# Patient Record
Sex: Male | Born: 1945 | Race: White | Hispanic: No | Smoking: Former smoker
Health system: Southern US, Community
[De-identification: ages and names within clinical notes are randomized; demographics above are authoritative.]

## PROBLEM LIST (undated history)

## (undated) DIAGNOSIS — D649 Anemia, unspecified: Secondary | ICD-10-CM

## (undated) DIAGNOSIS — N189 Chronic kidney disease, unspecified: Secondary | ICD-10-CM

## (undated) DIAGNOSIS — K219 Gastro-esophageal reflux disease without esophagitis: Secondary | ICD-10-CM

## (undated) DIAGNOSIS — C801 Malignant (primary) neoplasm, unspecified: Secondary | ICD-10-CM

## (undated) DIAGNOSIS — A6920 Lyme disease, unspecified: Secondary | ICD-10-CM

## (undated) DIAGNOSIS — H269 Unspecified cataract: Secondary | ICD-10-CM

## (undated) DIAGNOSIS — M47812 Spondylosis without myelopathy or radiculopathy, cervical region: Secondary | ICD-10-CM

## (undated) DIAGNOSIS — R413 Other amnesia: Secondary | ICD-10-CM

## (undated) DIAGNOSIS — K111 Hypertrophy of salivary gland: Secondary | ICD-10-CM

## (undated) DIAGNOSIS — C189 Malignant neoplasm of colon, unspecified: Secondary | ICD-10-CM

## (undated) DIAGNOSIS — Z87442 Personal history of urinary calculi: Secondary | ICD-10-CM

## (undated) DIAGNOSIS — I639 Cerebral infarction, unspecified: Secondary | ICD-10-CM

## (undated) DIAGNOSIS — K5900 Constipation, unspecified: Secondary | ICD-10-CM

## (undated) DIAGNOSIS — G039 Meningitis, unspecified: Secondary | ICD-10-CM

## (undated) DIAGNOSIS — G4730 Sleep apnea, unspecified: Secondary | ICD-10-CM

## (undated) DIAGNOSIS — J189 Pneumonia, unspecified organism: Secondary | ICD-10-CM

## (undated) DIAGNOSIS — K649 Unspecified hemorrhoids: Secondary | ICD-10-CM

## (undated) DIAGNOSIS — C3490 Malignant neoplasm of unspecified part of unspecified bronchus or lung: Secondary | ICD-10-CM

## (undated) DIAGNOSIS — J338 Other polyp of sinus: Secondary | ICD-10-CM

## (undated) DIAGNOSIS — Z15068 Genetic susceptibility to colorectal cancer: Secondary | ICD-10-CM

## (undated) DIAGNOSIS — M1990 Unspecified osteoarthritis, unspecified site: Secondary | ICD-10-CM

## (undated) DIAGNOSIS — K642 Third degree hemorrhoids: Secondary | ICD-10-CM

## (undated) DIAGNOSIS — Z5189 Encounter for other specified aftercare: Secondary | ICD-10-CM

## (undated) DIAGNOSIS — N200 Calculus of kidney: Secondary | ICD-10-CM

## (undated) DIAGNOSIS — C179 Malignant neoplasm of small intestine, unspecified: Secondary | ICD-10-CM

## (undated) DIAGNOSIS — K648 Other hemorrhoids: Secondary | ICD-10-CM

## (undated) DIAGNOSIS — I499 Cardiac arrhythmia, unspecified: Secondary | ICD-10-CM

## (undated) DIAGNOSIS — Z1509 Genetic susceptibility to other malignant neoplasm: Secondary | ICD-10-CM

## (undated) DIAGNOSIS — Z1507 Genetic susceptibility to malignant neoplasm of urinary tract: Secondary | ICD-10-CM

## (undated) DIAGNOSIS — N400 Enlarged prostate without lower urinary tract symptoms: Secondary | ICD-10-CM

## (undated) DIAGNOSIS — G834 Cauda equina syndrome: Secondary | ICD-10-CM

## (undated) DIAGNOSIS — Z86010 Personal history of colonic polyps: Secondary | ICD-10-CM

## (undated) HISTORY — PX: OTHER SURGICAL HISTORY: SHX169

## (undated) HISTORY — DX: Anemia, unspecified: D64.9

## (undated) HISTORY — DX: Cardiac arrhythmia, unspecified: I49.9

## (undated) HISTORY — PX: COLONOSCOPY: SHX174

## (undated) HISTORY — DX: Genetic susceptibility to other malignant neoplasm: Z15.09

## (undated) HISTORY — DX: Unspecified cataract: H26.9

## (undated) HISTORY — DX: Chronic kidney disease, unspecified: N18.9

## (undated) HISTORY — DX: Gastro-esophageal reflux disease without esophagitis: K21.9

## (undated) HISTORY — DX: Malignant (primary) neoplasm, unspecified: C80.1

## (undated) HISTORY — PX: UPPER GI ENDOSCOPY: SHX6162

## (undated) HISTORY — DX: Constipation, unspecified: K59.00

## (undated) HISTORY — DX: Other polyp of sinus: J33.8

## (undated) HISTORY — DX: Encounter for other specified aftercare: Z51.89

## (undated) HISTORY — DX: Genetic susceptibility to malignant neoplasm of urinary tract: Z15.07

## (undated) HISTORY — DX: Meningitis, unspecified: G03.9

## (undated) HISTORY — DX: Sleep apnea, unspecified: G47.30

## (undated) HISTORY — DX: Benign prostatic hyperplasia without lower urinary tract symptoms: N40.0

## (undated) HISTORY — DX: Unspecified hemorrhoids: K64.9

## (undated) HISTORY — DX: Cauda equina syndrome: G83.4

## (undated) HISTORY — DX: Malignant neoplasm of unspecified part of unspecified bronchus or lung: C34.90

## (undated) HISTORY — DX: Third degree hemorrhoids: K64.2

## (undated) HISTORY — DX: Cerebral infarction, unspecified: I63.9

## (undated) HISTORY — DX: Unspecified osteoarthritis, unspecified site: M19.90

## (undated) HISTORY — DX: Other hemorrhoids: K64.8

## (undated) HISTORY — PX: HEMORRHOID BANDING: SHX5850

## (undated) HISTORY — DX: Genetic susceptibility to other malignant neoplasm of digestive system: Z15.09

## (undated) HISTORY — PX: SPINE SURGERY: SHX786

## (undated) HISTORY — DX: Malignant neoplasm of colon, unspecified: C18.9

## (undated) HISTORY — DX: Lyme disease, unspecified: A69.20

## (undated) HISTORY — DX: Hypertrophy of salivary gland: K11.1

## (undated) HISTORY — DX: Personal history of colonic polyps: Z86.010

## (undated) HISTORY — DX: Malignant neoplasm of small intestine, unspecified: C17.9

## (undated) HISTORY — DX: Spondylosis without myelopathy or radiculopathy, cervical region: M47.812

## (undated) HISTORY — DX: Calculus of kidney: N20.0

## (undated) HISTORY — DX: Genetic susceptibility to colorectal cancer: Z15.068

---

## 2005-08-22 ENCOUNTER — Ambulatory Visit (HOSPITAL_COMMUNITY): Admission: RE | Admit: 2005-08-22 | Discharge: 2005-08-22 | Payer: Self-pay | Admitting: Otolaryngology

## 2006-09-10 ENCOUNTER — Ambulatory Visit: Payer: Self-pay | Admitting: Internal Medicine

## 2006-09-10 DIAGNOSIS — K642 Third degree hemorrhoids: Secondary | ICD-10-CM | POA: Insufficient documentation

## 2006-09-10 DIAGNOSIS — R5383 Other fatigue: Secondary | ICD-10-CM

## 2006-09-10 DIAGNOSIS — K648 Other hemorrhoids: Secondary | ICD-10-CM

## 2006-09-10 DIAGNOSIS — M722 Plantar fascial fibromatosis: Secondary | ICD-10-CM | POA: Insufficient documentation

## 2006-09-10 DIAGNOSIS — R5381 Other malaise: Secondary | ICD-10-CM | POA: Insufficient documentation

## 2006-09-10 DIAGNOSIS — G4730 Sleep apnea, unspecified: Secondary | ICD-10-CM | POA: Insufficient documentation

## 2006-09-10 DIAGNOSIS — E291 Testicular hypofunction: Secondary | ICD-10-CM | POA: Insufficient documentation

## 2006-09-10 DIAGNOSIS — F528 Other sexual dysfunction not due to a substance or known physiological condition: Secondary | ICD-10-CM | POA: Insufficient documentation

## 2006-09-10 HISTORY — DX: Third degree hemorrhoids: K64.2

## 2006-09-10 HISTORY — DX: Other hemorrhoids: K64.8

## 2006-09-11 ENCOUNTER — Encounter (INDEPENDENT_AMBULATORY_CARE_PROVIDER_SITE_OTHER): Payer: Self-pay | Admitting: Family Medicine

## 2006-09-12 ENCOUNTER — Encounter (INDEPENDENT_AMBULATORY_CARE_PROVIDER_SITE_OTHER): Payer: Self-pay | Admitting: Family Medicine

## 2006-09-15 LAB — CONVERTED CEMR LAB
ALT: 17 units/L (ref 0–53)
AST: 17 units/L (ref 0–37)
Albumin: 4.5 g/dL (ref 3.5–5.2)
Alkaline Phosphatase: 49 units/L (ref 39–117)
BUN: 14 mg/dL (ref 6–23)
Basophils Absolute: 0 10*3/uL (ref 0.0–0.1)
Basophils Relative: 1 % (ref 0–1)
CO2: 25 meq/L (ref 19–32)
Calcium: 9.4 mg/dL (ref 8.4–10.5)
Chloride: 104 meq/L (ref 96–112)
Cholesterol: 159 mg/dL (ref 0–200)
Creatinine, Ser: 0.78 mg/dL (ref 0.40–1.50)
Eosinophils Absolute: 0.1 10*3/uL (ref 0.0–0.7)
Eosinophils Relative: 2 % (ref 0–5)
Glucose, Bld: 86 mg/dL (ref 70–99)
HCT: 42 % (ref 39.0–52.0)
HDL: 54 mg/dL (ref >39)
Hemoglobin: 14 g/dL (ref 13.0–17.0)
LDL Cholesterol: 89 mg/dL (ref 0–99)
Lymphocytes Relative: 39 % (ref 12–46)
Lymphs Abs: 2.5 10*3/uL (ref 0.7–3.3)
MCHC: 33.3 g/dL (ref 30.0–36.0)
MCV: 77.6 fL — ABNORMAL LOW (ref 78.0–100.0)
Monocytes Absolute: 0.6 10*3/uL (ref 0.2–0.7)
Monocytes Relative: 9 % (ref 3–11)
Neutro Abs: 3.2 10*3/uL (ref 1.7–7.7)
Neutrophils Relative %: 50 % (ref 43–77)
Platelets: 246 10*3/uL (ref 150–400)
Potassium: 4.3 meq/L (ref 3.5–5.3)
RBC: 5.41 M/uL (ref 4.22–5.81)
RDW: 14.3 % — ABNORMAL HIGH (ref 11.5–14.0)
Sex Hormone Binding: 33 nmol/L (ref 13–71)
Sodium: 140 meq/L (ref 135–145)
TSH: 1.521 microintl units/mL (ref 0.350–5.50)
Testosterone Free: 97 pg/mL (ref 47.0–244.0)
Testosterone-% Free: 2.1 % (ref 1.6–2.9)
Testosterone: 467.03 ng/dL (ref 350–890)
Total Bilirubin: 1 mg/dL (ref 0.3–1.2)
Total CHOL/HDL Ratio: 2.9
Total Protein: 6.9 g/dL (ref 6.0–8.3)
Triglycerides: 78 mg/dL (ref <150)
VLDL: 16 mg/dL (ref 0–40)
WBC: 6.4 10*3/uL (ref 4.0–10.5)

## 2006-09-17 ENCOUNTER — Encounter (INDEPENDENT_AMBULATORY_CARE_PROVIDER_SITE_OTHER): Payer: Self-pay | Admitting: Family Medicine

## 2006-10-08 ENCOUNTER — Ambulatory Visit: Payer: Self-pay | Admitting: Family Medicine

## 2006-10-08 DIAGNOSIS — I10 Essential (primary) hypertension: Secondary | ICD-10-CM | POA: Insufficient documentation

## 2006-11-19 ENCOUNTER — Ambulatory Visit: Payer: Self-pay | Admitting: Family Medicine

## 2007-01-28 ENCOUNTER — Ambulatory Visit: Payer: Self-pay | Admitting: Family Medicine

## 2007-10-02 ENCOUNTER — Ambulatory Visit: Payer: Self-pay | Admitting: Family Medicine

## 2007-10-02 ENCOUNTER — Telehealth (INDEPENDENT_AMBULATORY_CARE_PROVIDER_SITE_OTHER): Payer: Self-pay | Admitting: *Deleted

## 2007-10-02 DIAGNOSIS — K219 Gastro-esophageal reflux disease without esophagitis: Secondary | ICD-10-CM | POA: Insufficient documentation

## 2007-10-03 ENCOUNTER — Encounter (INDEPENDENT_AMBULATORY_CARE_PROVIDER_SITE_OTHER): Payer: Self-pay | Admitting: Family Medicine

## 2007-10-17 ENCOUNTER — Encounter (INDEPENDENT_AMBULATORY_CARE_PROVIDER_SITE_OTHER): Payer: Self-pay | Admitting: Family Medicine

## 2007-10-17 ENCOUNTER — Telehealth (INDEPENDENT_AMBULATORY_CARE_PROVIDER_SITE_OTHER): Payer: Self-pay | Admitting: *Deleted

## 2008-04-28 ENCOUNTER — Ambulatory Visit: Payer: Self-pay | Admitting: Internal Medicine

## 2009-02-07 ENCOUNTER — Ambulatory Visit: Payer: Self-pay | Admitting: Family Medicine

## 2009-02-07 DIAGNOSIS — M1990 Unspecified osteoarthritis, unspecified site: Secondary | ICD-10-CM | POA: Insufficient documentation

## 2009-02-07 DIAGNOSIS — R1032 Left lower quadrant pain: Secondary | ICD-10-CM | POA: Insufficient documentation

## 2009-02-07 DIAGNOSIS — R51 Headache: Secondary | ICD-10-CM | POA: Insufficient documentation

## 2009-02-10 ENCOUNTER — Encounter (INDEPENDENT_AMBULATORY_CARE_PROVIDER_SITE_OTHER): Payer: Self-pay | Admitting: Family Medicine

## 2009-02-11 LAB — CONVERTED CEMR LAB
ALT: 15 units/L (ref 0–53)
AST: 19 units/L (ref 0–37)
Albumin: 4.2 g/dL (ref 3.5–5.2)
CO2: 24 meq/L (ref 19–32)
Calcium: 9.1 mg/dL (ref 8.4–10.5)
Chloride: 105 meq/L (ref 96–112)
Cholesterol: 154 mg/dL (ref 0–200)
MCHC: 32.9 g/dL (ref 30.0–36.0)
MCV: 77.6 fL — ABNORMAL LOW (ref 78.0–100.0)
Neutrophils Relative %: 51 % (ref 43–77)
Platelets: 220 10*3/uL (ref 150–400)
Potassium: 4.6 meq/L (ref 3.5–5.3)
RDW: 14.8 % (ref 11.5–15.5)
Total CHOL/HDL Ratio: 2.9
Triglycerides: 77 mg/dL (ref <150)
VLDL: 15 mg/dL (ref 0–40)

## 2009-02-21 ENCOUNTER — Ambulatory Visit: Payer: Self-pay | Admitting: Family Medicine

## 2009-02-25 ENCOUNTER — Encounter (INDEPENDENT_AMBULATORY_CARE_PROVIDER_SITE_OTHER): Payer: Self-pay | Admitting: *Deleted

## 2009-03-08 ENCOUNTER — Telehealth (INDEPENDENT_AMBULATORY_CARE_PROVIDER_SITE_OTHER): Payer: Self-pay | Admitting: *Deleted

## 2009-03-10 ENCOUNTER — Ambulatory Visit: Payer: Self-pay | Admitting: Family Medicine

## 2009-03-10 DIAGNOSIS — S0083XA Contusion of other part of head, initial encounter: Secondary | ICD-10-CM

## 2009-03-10 DIAGNOSIS — S1093XA Contusion of unspecified part of neck, initial encounter: Secondary | ICD-10-CM

## 2009-03-10 DIAGNOSIS — S0003XA Contusion of scalp, initial encounter: Secondary | ICD-10-CM | POA: Insufficient documentation

## 2009-07-16 DIAGNOSIS — G834 Cauda equina syndrome: Secondary | ICD-10-CM

## 2009-07-16 HISTORY — DX: Cauda equina syndrome: G83.4

## 2010-03-07 ENCOUNTER — Encounter: Payer: Self-pay | Admitting: Emergency Medicine

## 2010-03-08 ENCOUNTER — Inpatient Hospital Stay (HOSPITAL_COMMUNITY): Admission: AD | Admit: 2010-03-08 | Discharge: 2010-03-16 | Payer: Self-pay | Admitting: Neurosurgery

## 2010-03-08 HISTORY — PX: LUMBAR LAMINECTOMY: SHX95

## 2010-03-10 ENCOUNTER — Ambulatory Visit: Payer: Self-pay | Admitting: Physical Medicine & Rehabilitation

## 2010-03-18 ENCOUNTER — Emergency Department (HOSPITAL_COMMUNITY): Admission: EM | Admit: 2010-03-18 | Discharge: 2010-03-18 | Payer: Self-pay | Admitting: Emergency Medicine

## 2010-09-29 LAB — URINALYSIS, ROUTINE W REFLEX MICROSCOPIC
Hgb urine dipstick: NEGATIVE
Nitrite: NEGATIVE
Specific Gravity, Urine: 1.021 (ref 1.005–1.030)
Urobilinogen, UA: 0.2 mg/dL (ref 0.0–1.0)
pH: 6 (ref 5.0–8.0)

## 2010-09-29 LAB — BASIC METABOLIC PANEL
BUN: 16 mg/dL (ref 6–23)
CO2: 28 mEq/L (ref 19–32)
Creatinine, Ser: 0.84 mg/dL (ref 0.4–1.5)
GFR calc non Af Amer: >60 mL/min (ref >60)

## 2010-09-29 LAB — DIFFERENTIAL
Basophils Relative: 1 % (ref 0–1)
Eosinophils Absolute: 0 10*3/uL (ref 0.0–0.7)
Lymphocytes Relative: 28 % (ref 12–46)
Lymphs Abs: 2.7 10*3/uL (ref 0.7–4.0)
Monocytes Relative: 8 % (ref 3–12)

## 2010-09-29 LAB — CBC
MCV: 81 fL (ref 78.0–100.0)
Platelets: 245 10*3/uL (ref 150–400)
RBC: 5.09 MIL/uL (ref 4.22–5.81)
RDW: 14.4 % (ref 11.5–15.5)
WBC: 9.5 10*3/uL (ref 4.0–10.5)

## 2011-05-29 ENCOUNTER — Other Ambulatory Visit: Payer: Self-pay | Admitting: Podiatry

## 2011-05-29 DIAGNOSIS — M722 Plantar fascial fibromatosis: Secondary | ICD-10-CM

## 2011-05-31 ENCOUNTER — Ambulatory Visit (HOSPITAL_COMMUNITY)
Admission: RE | Admit: 2011-05-31 | Discharge: 2011-05-31 | Disposition: A | Discharge status: Home / Self Care | Payer: BC Managed Care – PPO | Source: Ambulatory Visit | Attending: Podiatry | Admitting: Podiatry

## 2011-05-31 ENCOUNTER — Ambulatory Visit (HOSPITAL_COMMUNITY): Admission: RE | Admit: 2011-05-31 | Payer: BC Managed Care – PPO | Source: Ambulatory Visit

## 2011-05-31 DIAGNOSIS — M659 Synovitis and tenosynovitis, unspecified: Secondary | ICD-10-CM | POA: Insufficient documentation

## 2011-05-31 DIAGNOSIS — M722 Plantar fascial fibromatosis: Secondary | ICD-10-CM

## 2011-05-31 DIAGNOSIS — M2140 Flat foot [pes planus] (acquired), unspecified foot: Secondary | ICD-10-CM | POA: Insufficient documentation

## 2011-05-31 DIAGNOSIS — M65979 Unspecified synovitis and tenosynovitis, unspecified ankle and foot: Secondary | ICD-10-CM | POA: Insufficient documentation

## 2012-03-31 ENCOUNTER — Encounter: Payer: Self-pay | Admitting: Internal Medicine

## 2012-11-13 ENCOUNTER — Encounter: Payer: Self-pay | Admitting: Internal Medicine

## 2013-02-25 DIAGNOSIS — M961 Postlaminectomy syndrome, not elsewhere classified: Secondary | ICD-10-CM | POA: Diagnosis not present

## 2013-02-25 DIAGNOSIS — M546 Pain in thoracic spine: Secondary | ICD-10-CM | POA: Diagnosis not present

## 2013-02-25 DIAGNOSIS — M999 Biomechanical lesion, unspecified: Secondary | ICD-10-CM | POA: Diagnosis not present

## 2013-03-26 DIAGNOSIS — M999 Biomechanical lesion, unspecified: Secondary | ICD-10-CM | POA: Diagnosis not present

## 2013-03-26 DIAGNOSIS — M546 Pain in thoracic spine: Secondary | ICD-10-CM | POA: Diagnosis not present

## 2013-03-26 DIAGNOSIS — M961 Postlaminectomy syndrome, not elsewhere classified: Secondary | ICD-10-CM | POA: Diagnosis not present

## 2013-05-28 DIAGNOSIS — M999 Biomechanical lesion, unspecified: Secondary | ICD-10-CM | POA: Diagnosis not present

## 2013-05-28 DIAGNOSIS — M961 Postlaminectomy syndrome, not elsewhere classified: Secondary | ICD-10-CM | POA: Diagnosis not present

## 2013-05-28 DIAGNOSIS — M546 Pain in thoracic spine: Secondary | ICD-10-CM | POA: Diagnosis not present

## 2013-06-24 DIAGNOSIS — M999 Biomechanical lesion, unspecified: Secondary | ICD-10-CM | POA: Diagnosis not present

## 2013-06-24 DIAGNOSIS — M961 Postlaminectomy syndrome, not elsewhere classified: Secondary | ICD-10-CM | POA: Diagnosis not present

## 2013-06-24 DIAGNOSIS — M546 Pain in thoracic spine: Secondary | ICD-10-CM | POA: Diagnosis not present

## 2013-07-31 DIAGNOSIS — M961 Postlaminectomy syndrome, not elsewhere classified: Secondary | ICD-10-CM | POA: Diagnosis not present

## 2013-07-31 DIAGNOSIS — M999 Biomechanical lesion, unspecified: Secondary | ICD-10-CM | POA: Diagnosis not present

## 2013-07-31 DIAGNOSIS — M546 Pain in thoracic spine: Secondary | ICD-10-CM | POA: Diagnosis not present

## 2013-09-08 DIAGNOSIS — M961 Postlaminectomy syndrome, not elsewhere classified: Secondary | ICD-10-CM | POA: Diagnosis not present

## 2013-09-08 DIAGNOSIS — M999 Biomechanical lesion, unspecified: Secondary | ICD-10-CM | POA: Diagnosis not present

## 2013-09-08 DIAGNOSIS — M546 Pain in thoracic spine: Secondary | ICD-10-CM | POA: Diagnosis not present

## 2013-10-07 DIAGNOSIS — M546 Pain in thoracic spine: Secondary | ICD-10-CM | POA: Diagnosis not present

## 2013-10-07 DIAGNOSIS — M961 Postlaminectomy syndrome, not elsewhere classified: Secondary | ICD-10-CM | POA: Diagnosis not present

## 2013-10-07 DIAGNOSIS — M999 Biomechanical lesion, unspecified: Secondary | ICD-10-CM | POA: Diagnosis not present

## 2013-10-09 DIAGNOSIS — I8390 Asymptomatic varicose veins of unspecified lower extremity: Secondary | ICD-10-CM | POA: Diagnosis not present

## 2013-10-09 DIAGNOSIS — Z23 Encounter for immunization: Secondary | ICD-10-CM | POA: Diagnosis not present

## 2013-10-09 DIAGNOSIS — Z0000 Encounter for general adult medical examination without abnormal findings: Secondary | ICD-10-CM | POA: Diagnosis not present

## 2013-10-09 DIAGNOSIS — Z1211 Encounter for screening for malignant neoplasm of colon: Secondary | ICD-10-CM | POA: Diagnosis not present

## 2013-10-13 DIAGNOSIS — L719 Rosacea, unspecified: Secondary | ICD-10-CM | POA: Diagnosis not present

## 2013-10-13 DIAGNOSIS — L259 Unspecified contact dermatitis, unspecified cause: Secondary | ICD-10-CM | POA: Diagnosis not present

## 2013-10-22 DIAGNOSIS — M79609 Pain in unspecified limb: Secondary | ICD-10-CM | POA: Diagnosis not present

## 2013-10-22 DIAGNOSIS — I8310 Varicose veins of unspecified lower extremity with inflammation: Secondary | ICD-10-CM | POA: Diagnosis not present

## 2013-10-27 DIAGNOSIS — M79609 Pain in unspecified limb: Secondary | ICD-10-CM | POA: Diagnosis not present

## 2013-10-27 DIAGNOSIS — I8310 Varicose veins of unspecified lower extremity with inflammation: Secondary | ICD-10-CM | POA: Diagnosis not present

## 2013-11-03 DIAGNOSIS — I8310 Varicose veins of unspecified lower extremity with inflammation: Secondary | ICD-10-CM | POA: Diagnosis not present

## 2013-11-04 DIAGNOSIS — M961 Postlaminectomy syndrome, not elsewhere classified: Secondary | ICD-10-CM | POA: Diagnosis not present

## 2013-11-04 DIAGNOSIS — M999 Biomechanical lesion, unspecified: Secondary | ICD-10-CM | POA: Diagnosis not present

## 2013-11-04 DIAGNOSIS — M546 Pain in thoracic spine: Secondary | ICD-10-CM | POA: Diagnosis not present

## 2013-12-02 DIAGNOSIS — M546 Pain in thoracic spine: Secondary | ICD-10-CM | POA: Diagnosis not present

## 2013-12-02 DIAGNOSIS — M961 Postlaminectomy syndrome, not elsewhere classified: Secondary | ICD-10-CM | POA: Diagnosis not present

## 2013-12-02 DIAGNOSIS — M999 Biomechanical lesion, unspecified: Secondary | ICD-10-CM | POA: Diagnosis not present

## 2013-12-29 DIAGNOSIS — M546 Pain in thoracic spine: Secondary | ICD-10-CM | POA: Diagnosis not present

## 2013-12-29 DIAGNOSIS — M961 Postlaminectomy syndrome, not elsewhere classified: Secondary | ICD-10-CM | POA: Diagnosis not present

## 2013-12-29 DIAGNOSIS — M999 Biomechanical lesion, unspecified: Secondary | ICD-10-CM | POA: Diagnosis not present

## 2013-12-31 DIAGNOSIS — M62838 Other muscle spasm: Secondary | ICD-10-CM | POA: Diagnosis not present

## 2013-12-31 DIAGNOSIS — IMO0002 Reserved for concepts with insufficient information to code with codable children: Secondary | ICD-10-CM | POA: Diagnosis not present

## 2014-01-01 DIAGNOSIS — M999 Biomechanical lesion, unspecified: Secondary | ICD-10-CM | POA: Diagnosis not present

## 2014-01-01 DIAGNOSIS — M9981 Other biomechanical lesions of cervical region: Secondary | ICD-10-CM | POA: Diagnosis not present

## 2014-01-01 DIAGNOSIS — M531 Cervicobrachial syndrome: Secondary | ICD-10-CM | POA: Diagnosis not present

## 2014-01-01 DIAGNOSIS — M546 Pain in thoracic spine: Secondary | ICD-10-CM | POA: Diagnosis not present

## 2014-01-04 DIAGNOSIS — M9981 Other biomechanical lesions of cervical region: Secondary | ICD-10-CM | POA: Diagnosis not present

## 2014-01-04 DIAGNOSIS — M999 Biomechanical lesion, unspecified: Secondary | ICD-10-CM | POA: Diagnosis not present

## 2014-01-04 DIAGNOSIS — M531 Cervicobrachial syndrome: Secondary | ICD-10-CM | POA: Diagnosis not present

## 2014-01-04 DIAGNOSIS — M546 Pain in thoracic spine: Secondary | ICD-10-CM | POA: Diagnosis not present

## 2014-01-07 DIAGNOSIS — M531 Cervicobrachial syndrome: Secondary | ICD-10-CM | POA: Diagnosis not present

## 2014-01-07 DIAGNOSIS — M9981 Other biomechanical lesions of cervical region: Secondary | ICD-10-CM | POA: Diagnosis not present

## 2014-01-07 DIAGNOSIS — M999 Biomechanical lesion, unspecified: Secondary | ICD-10-CM | POA: Diagnosis not present

## 2014-01-07 DIAGNOSIS — M546 Pain in thoracic spine: Secondary | ICD-10-CM | POA: Diagnosis not present

## 2014-01-11 ENCOUNTER — Other Ambulatory Visit (HOSPITAL_COMMUNITY): Payer: Self-pay | Admitting: Family Medicine

## 2014-01-11 DIAGNOSIS — M5412 Radiculopathy, cervical region: Secondary | ICD-10-CM | POA: Diagnosis not present

## 2014-01-14 ENCOUNTER — Ambulatory Visit (HOSPITAL_COMMUNITY)
Admission: RE | Admit: 2014-01-14 | Discharge: 2014-01-14 | Disposition: A | Discharge status: Home / Self Care | Payer: Medicare Other | Source: Ambulatory Visit | Attending: Family Medicine | Admitting: Family Medicine

## 2014-01-14 DIAGNOSIS — M25519 Pain in unspecified shoulder: Secondary | ICD-10-CM | POA: Diagnosis not present

## 2014-01-14 DIAGNOSIS — X500XXA Overexertion from strenuous movement or load, initial encounter: Secondary | ICD-10-CM | POA: Insufficient documentation

## 2014-01-14 DIAGNOSIS — S0993XA Unspecified injury of face, initial encounter: Secondary | ICD-10-CM | POA: Diagnosis not present

## 2014-01-14 DIAGNOSIS — M47812 Spondylosis without myelopathy or radiculopathy, cervical region: Secondary | ICD-10-CM | POA: Diagnosis not present

## 2014-01-14 DIAGNOSIS — R209 Unspecified disturbances of skin sensation: Secondary | ICD-10-CM | POA: Diagnosis not present

## 2014-01-14 DIAGNOSIS — M5412 Radiculopathy, cervical region: Secondary | ICD-10-CM

## 2014-01-14 DIAGNOSIS — S199XXA Unspecified injury of neck, initial encounter: Secondary | ICD-10-CM | POA: Diagnosis not present

## 2014-01-14 DIAGNOSIS — M4802 Spinal stenosis, cervical region: Secondary | ICD-10-CM | POA: Insufficient documentation

## 2014-01-14 DIAGNOSIS — R29898 Other symptoms and signs involving the musculoskeletal system: Secondary | ICD-10-CM | POA: Diagnosis not present

## 2014-01-19 DIAGNOSIS — M5412 Radiculopathy, cervical region: Secondary | ICD-10-CM | POA: Diagnosis not present

## 2014-01-19 DIAGNOSIS — M4722 Other spondylosis with radiculopathy, cervical region: Secondary | ICD-10-CM | POA: Insufficient documentation

## 2014-01-21 ENCOUNTER — Other Ambulatory Visit: Payer: Self-pay | Admitting: Neurosurgery

## 2014-01-21 DIAGNOSIS — M5412 Radiculopathy, cervical region: Secondary | ICD-10-CM

## 2014-01-26 DIAGNOSIS — M1990 Unspecified osteoarthritis, unspecified site: Secondary | ICD-10-CM | POA: Diagnosis not present

## 2014-01-27 DIAGNOSIS — M1990 Unspecified osteoarthritis, unspecified site: Secondary | ICD-10-CM | POA: Diagnosis not present

## 2014-01-28 ENCOUNTER — Ambulatory Visit
Admission: RE | Admit: 2014-01-28 | Discharge: 2014-01-28 | Disposition: A | Discharge status: Home / Self Care | Payer: Medicare Other | Source: Ambulatory Visit | Attending: Neurosurgery | Admitting: Neurosurgery

## 2014-01-28 DIAGNOSIS — M5412 Radiculopathy, cervical region: Secondary | ICD-10-CM

## 2014-01-28 DIAGNOSIS — M47812 Spondylosis without myelopathy or radiculopathy, cervical region: Secondary | ICD-10-CM | POA: Diagnosis not present

## 2014-01-28 DIAGNOSIS — M4802 Spinal stenosis, cervical region: Secondary | ICD-10-CM | POA: Diagnosis not present

## 2014-01-28 MED ORDER — ONDANSETRON HCL 4 MG/2ML IJ SOLN
4.0000 mg | Freq: Once | INTRAMUSCULAR | Status: AC
  Administered 2014-01-28: 4 mg via INTRAMUSCULAR

## 2014-01-28 MED ORDER — IOHEXOL 300 MG/ML  SOLN
10.0000 mL | Freq: Once | INTRAMUSCULAR | Status: AC | PRN
  Administered 2014-01-28: 10 mL via INTRATHECAL

## 2014-01-28 MED ORDER — DIAZEPAM 5 MG PO TABS
5.0000 mg | ORAL_TABLET | Freq: Once | ORAL | Status: AC
  Administered 2014-01-28: 5 mg via ORAL

## 2014-01-28 MED ORDER — HYDROMORPHONE HCL PF 1 MG/ML IJ SOLN
1.0000 mg | Freq: Once | INTRAMUSCULAR | Status: AC
  Administered 2014-01-28: 1 mg via INTRAMUSCULAR

## 2014-01-28 NOTE — Discharge Instructions (Signed)

## 2014-01-28 NOTE — Progress Notes (Signed)
Patient resting quietly on stretcher in nursing station without complaint after myelogram.  Wife at bedside.  jkl

## 2014-02-01 DIAGNOSIS — M5412 Radiculopathy, cervical region: Secondary | ICD-10-CM | POA: Diagnosis not present

## 2014-02-01 DIAGNOSIS — M47812 Spondylosis without myelopathy or radiculopathy, cervical region: Secondary | ICD-10-CM | POA: Diagnosis not present

## 2014-02-02 DIAGNOSIS — M1990 Unspecified osteoarthritis, unspecified site: Secondary | ICD-10-CM | POA: Diagnosis not present

## 2014-02-11 DIAGNOSIS — M542 Cervicalgia: Secondary | ICD-10-CM | POA: Diagnosis not present

## 2014-02-11 DIAGNOSIS — M546 Pain in thoracic spine: Secondary | ICD-10-CM | POA: Diagnosis not present

## 2014-02-11 DIAGNOSIS — M9981 Other biomechanical lesions of cervical region: Secondary | ICD-10-CM | POA: Diagnosis not present

## 2014-02-11 DIAGNOSIS — M531 Cervicobrachial syndrome: Secondary | ICD-10-CM | POA: Diagnosis not present

## 2014-02-15 DIAGNOSIS — M542 Cervicalgia: Secondary | ICD-10-CM | POA: Diagnosis not present

## 2014-02-15 DIAGNOSIS — M546 Pain in thoracic spine: Secondary | ICD-10-CM | POA: Diagnosis not present

## 2014-02-15 DIAGNOSIS — M531 Cervicobrachial syndrome: Secondary | ICD-10-CM | POA: Diagnosis not present

## 2014-02-15 DIAGNOSIS — M9981 Other biomechanical lesions of cervical region: Secondary | ICD-10-CM | POA: Diagnosis not present

## 2014-02-18 DIAGNOSIS — M9981 Other biomechanical lesions of cervical region: Secondary | ICD-10-CM | POA: Diagnosis not present

## 2014-02-18 DIAGNOSIS — M531 Cervicobrachial syndrome: Secondary | ICD-10-CM | POA: Diagnosis not present

## 2014-02-18 DIAGNOSIS — M546 Pain in thoracic spine: Secondary | ICD-10-CM | POA: Diagnosis not present

## 2014-02-18 DIAGNOSIS — M542 Cervicalgia: Secondary | ICD-10-CM | POA: Diagnosis not present

## 2014-02-22 DIAGNOSIS — M542 Cervicalgia: Secondary | ICD-10-CM | POA: Diagnosis not present

## 2014-02-22 DIAGNOSIS — M9981 Other biomechanical lesions of cervical region: Secondary | ICD-10-CM | POA: Diagnosis not present

## 2014-02-22 DIAGNOSIS — M546 Pain in thoracic spine: Secondary | ICD-10-CM | POA: Diagnosis not present

## 2014-02-22 DIAGNOSIS — M531 Cervicobrachial syndrome: Secondary | ICD-10-CM | POA: Diagnosis not present

## 2014-02-25 DIAGNOSIS — M546 Pain in thoracic spine: Secondary | ICD-10-CM | POA: Diagnosis not present

## 2014-02-25 DIAGNOSIS — M9981 Other biomechanical lesions of cervical region: Secondary | ICD-10-CM | POA: Diagnosis not present

## 2014-02-25 DIAGNOSIS — M999 Biomechanical lesion, unspecified: Secondary | ICD-10-CM | POA: Diagnosis not present

## 2014-02-25 DIAGNOSIS — M531 Cervicobrachial syndrome: Secondary | ICD-10-CM | POA: Diagnosis not present

## 2014-03-01 DIAGNOSIS — M4802 Spinal stenosis, cervical region: Secondary | ICD-10-CM | POA: Diagnosis not present

## 2014-03-01 DIAGNOSIS — G545 Neuralgic amyotrophy: Secondary | ICD-10-CM | POA: Diagnosis not present

## 2014-03-03 DIAGNOSIS — M531 Cervicobrachial syndrome: Secondary | ICD-10-CM | POA: Diagnosis not present

## 2014-03-03 DIAGNOSIS — M546 Pain in thoracic spine: Secondary | ICD-10-CM | POA: Diagnosis not present

## 2014-03-03 DIAGNOSIS — M9981 Other biomechanical lesions of cervical region: Secondary | ICD-10-CM | POA: Diagnosis not present

## 2014-03-03 DIAGNOSIS — M542 Cervicalgia: Secondary | ICD-10-CM | POA: Diagnosis not present

## 2014-03-11 DIAGNOSIS — M9981 Other biomechanical lesions of cervical region: Secondary | ICD-10-CM | POA: Diagnosis not present

## 2014-03-11 DIAGNOSIS — M542 Cervicalgia: Secondary | ICD-10-CM | POA: Diagnosis not present

## 2014-03-11 DIAGNOSIS — M531 Cervicobrachial syndrome: Secondary | ICD-10-CM | POA: Diagnosis not present

## 2014-03-11 DIAGNOSIS — M546 Pain in thoracic spine: Secondary | ICD-10-CM | POA: Diagnosis not present

## 2014-04-07 DIAGNOSIS — M9981 Other biomechanical lesions of cervical region: Secondary | ICD-10-CM | POA: Diagnosis not present

## 2014-04-07 DIAGNOSIS — M531 Cervicobrachial syndrome: Secondary | ICD-10-CM | POA: Diagnosis not present

## 2014-04-07 DIAGNOSIS — M542 Cervicalgia: Secondary | ICD-10-CM | POA: Diagnosis not present

## 2014-04-07 DIAGNOSIS — M546 Pain in thoracic spine: Secondary | ICD-10-CM | POA: Diagnosis not present

## 2014-04-27 DIAGNOSIS — M2662 Arthralgia of temporomandibular joint: Secondary | ICD-10-CM | POA: Diagnosis not present

## 2014-04-27 DIAGNOSIS — K219 Gastro-esophageal reflux disease without esophagitis: Secondary | ICD-10-CM | POA: Diagnosis not present

## 2014-05-05 DIAGNOSIS — M546 Pain in thoracic spine: Secondary | ICD-10-CM | POA: Diagnosis not present

## 2014-05-05 DIAGNOSIS — M9902 Segmental and somatic dysfunction of thoracic region: Secondary | ICD-10-CM | POA: Diagnosis not present

## 2014-05-05 DIAGNOSIS — M5083 Other cervical disc disorders, cervicothoracic region: Secondary | ICD-10-CM | POA: Diagnosis not present

## 2014-05-05 DIAGNOSIS — M9901 Segmental and somatic dysfunction of cervical region: Secondary | ICD-10-CM | POA: Diagnosis not present

## 2014-06-02 DIAGNOSIS — M9902 Segmental and somatic dysfunction of thoracic region: Secondary | ICD-10-CM | POA: Diagnosis not present

## 2014-06-02 DIAGNOSIS — M546 Pain in thoracic spine: Secondary | ICD-10-CM | POA: Diagnosis not present

## 2014-06-02 DIAGNOSIS — M5083 Other cervical disc disorders, cervicothoracic region: Secondary | ICD-10-CM | POA: Diagnosis not present

## 2014-06-02 DIAGNOSIS — M9901 Segmental and somatic dysfunction of cervical region: Secondary | ICD-10-CM | POA: Diagnosis not present

## 2014-06-04 DIAGNOSIS — L299 Pruritus, unspecified: Secondary | ICD-10-CM | POA: Diagnosis not present

## 2014-06-09 DIAGNOSIS — M546 Pain in thoracic spine: Secondary | ICD-10-CM | POA: Diagnosis not present

## 2014-06-09 DIAGNOSIS — M5083 Other cervical disc disorders, cervicothoracic region: Secondary | ICD-10-CM | POA: Diagnosis not present

## 2014-06-09 DIAGNOSIS — M9902 Segmental and somatic dysfunction of thoracic region: Secondary | ICD-10-CM | POA: Diagnosis not present

## 2014-06-09 DIAGNOSIS — M9901 Segmental and somatic dysfunction of cervical region: Secondary | ICD-10-CM | POA: Diagnosis not present

## 2014-06-23 DIAGNOSIS — M9902 Segmental and somatic dysfunction of thoracic region: Secondary | ICD-10-CM | POA: Diagnosis not present

## 2014-06-23 DIAGNOSIS — M9901 Segmental and somatic dysfunction of cervical region: Secondary | ICD-10-CM | POA: Diagnosis not present

## 2014-06-23 DIAGNOSIS — M5083 Other cervical disc disorders, cervicothoracic region: Secondary | ICD-10-CM | POA: Diagnosis not present

## 2014-06-23 DIAGNOSIS — M546 Pain in thoracic spine: Secondary | ICD-10-CM | POA: Diagnosis not present

## 2014-07-07 DIAGNOSIS — L814 Other melanin hyperpigmentation: Secondary | ICD-10-CM | POA: Diagnosis not present

## 2014-07-07 DIAGNOSIS — L719 Rosacea, unspecified: Secondary | ICD-10-CM | POA: Diagnosis not present

## 2014-10-15 ENCOUNTER — Encounter: Payer: Self-pay | Admitting: Internal Medicine

## 2014-10-18 ENCOUNTER — Other Ambulatory Visit (HOSPITAL_COMMUNITY): Payer: Self-pay | Admitting: Family Medicine

## 2014-10-18 ENCOUNTER — Ambulatory Visit (HOSPITAL_COMMUNITY)
Admission: RE | Admit: 2014-10-18 | Discharge: 2014-10-18 | Disposition: A | Discharge status: Home / Self Care | Payer: Medicare Other | Source: Ambulatory Visit | Attending: Family Medicine | Admitting: Family Medicine

## 2014-10-18 DIAGNOSIS — M79641 Pain in right hand: Secondary | ICD-10-CM | POA: Insufficient documentation

## 2014-10-18 DIAGNOSIS — M7989 Other specified soft tissue disorders: Secondary | ICD-10-CM | POA: Insufficient documentation

## 2014-10-18 DIAGNOSIS — M1990 Unspecified osteoarthritis, unspecified site: Secondary | ICD-10-CM

## 2015-09-22 ENCOUNTER — Other Ambulatory Visit: Payer: Self-pay | Admitting: Family Medicine

## 2015-09-22 ENCOUNTER — Ambulatory Visit
Admission: RE | Admit: 2015-09-22 | Discharge: 2015-09-22 | Disposition: A | Discharge status: Home / Self Care | Payer: Medicare Other | Source: Ambulatory Visit | Attending: Family Medicine | Admitting: Family Medicine

## 2015-09-22 DIAGNOSIS — J209 Acute bronchitis, unspecified: Secondary | ICD-10-CM

## 2015-11-15 ENCOUNTER — Other Ambulatory Visit (HOSPITAL_COMMUNITY): Payer: Self-pay | Admitting: Family Medicine

## 2015-11-15 DIAGNOSIS — R079 Chest pain, unspecified: Secondary | ICD-10-CM

## 2015-11-15 DIAGNOSIS — Z8249 Family history of ischemic heart disease and other diseases of the circulatory system: Secondary | ICD-10-CM

## 2015-11-15 DIAGNOSIS — Z87891 Personal history of nicotine dependence: Secondary | ICD-10-CM

## 2015-11-16 ENCOUNTER — Ambulatory Visit (HOSPITAL_BASED_OUTPATIENT_CLINIC_OR_DEPARTMENT_OTHER)
Admission: RE | Admit: 2015-11-16 | Discharge: 2015-11-16 | Disposition: A | Discharge status: Home / Self Care | Payer: Medicare Other | Source: Ambulatory Visit | Attending: Cardiovascular Disease | Admitting: Cardiovascular Disease

## 2015-11-16 ENCOUNTER — Ambulatory Visit (HOSPITAL_COMMUNITY)
Admission: RE | Admit: 2015-11-16 | Discharge: 2015-11-16 | Disposition: A | Discharge status: Home / Self Care | Payer: Medicare Other | Source: Ambulatory Visit | Attending: Cardiovascular Disease | Admitting: Cardiovascular Disease

## 2015-11-16 DIAGNOSIS — Z8249 Family history of ischemic heart disease and other diseases of the circulatory system: Secondary | ICD-10-CM | POA: Insufficient documentation

## 2015-11-16 DIAGNOSIS — I351 Nonrheumatic aortic (valve) insufficiency: Secondary | ICD-10-CM | POA: Diagnosis not present

## 2015-11-16 DIAGNOSIS — I071 Rheumatic tricuspid insufficiency: Secondary | ICD-10-CM | POA: Diagnosis not present

## 2015-11-16 DIAGNOSIS — I517 Cardiomegaly: Secondary | ICD-10-CM | POA: Diagnosis not present

## 2015-11-16 DIAGNOSIS — Z87891 Personal history of nicotine dependence: Secondary | ICD-10-CM

## 2015-11-16 DIAGNOSIS — R079 Chest pain, unspecified: Secondary | ICD-10-CM

## 2015-11-16 DIAGNOSIS — I340 Nonrheumatic mitral (valve) insufficiency: Secondary | ICD-10-CM | POA: Diagnosis not present

## 2015-11-16 DIAGNOSIS — I5189 Other ill-defined heart diseases: Secondary | ICD-10-CM | POA: Diagnosis not present

## 2016-03-28 IMAGING — MR MR CERVICAL SPINE W/O CM
4 of 6 series · 13 of 48 positions shown · non-contrast
Comparison: None.

CLINICAL DATA: Lifting injury 3 weeks ago. LEFT scapular pain.
Tingling and weakness in the LEFT arm. Cervical radiculopathy.

EXAM:
MRI CERVICAL SPINE WITHOUT CONTRAST
TECHNIQUE: Multiplanar, multisequence MR imaging of the cervical spine was
performed. No intravenous contrast was administered.

[Series 3: T2 · sagittal · 3.0mm · 0.47mm/px · 4 of 13 slices shown (1 of 2)]
[im 1/13]
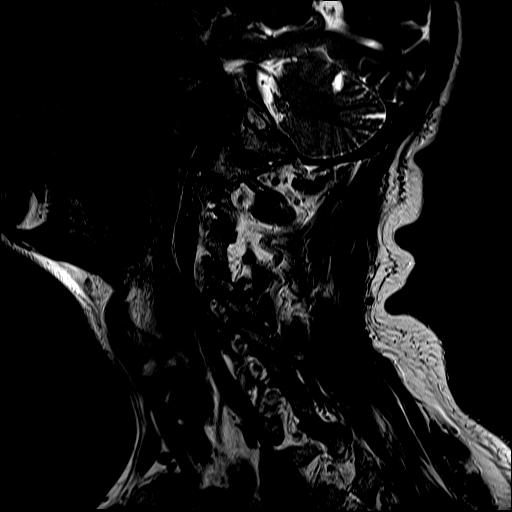
[im 5/13]
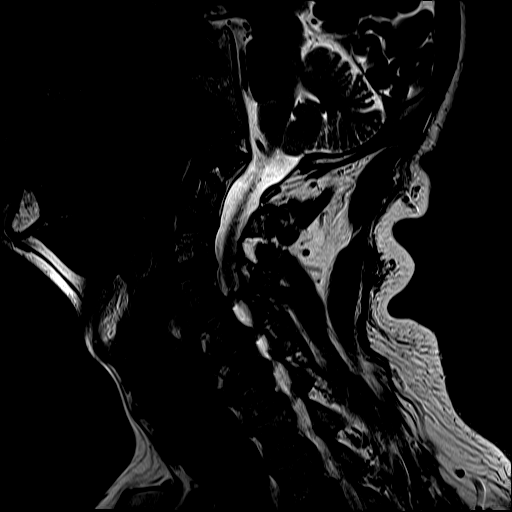
[im 9/13]
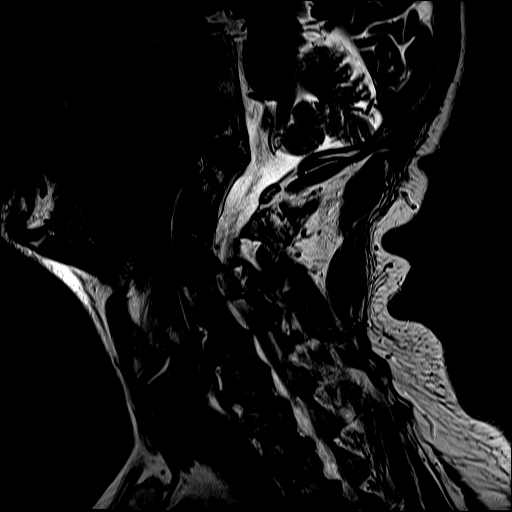
[im 13/13]
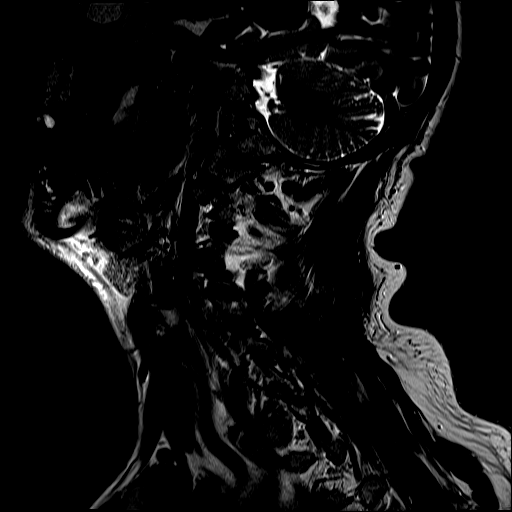

[Series 4: FLAIR · sagittal · 3.0mm · 0.49mm/px · 3 of 13 slices shown]
[im 1/13]
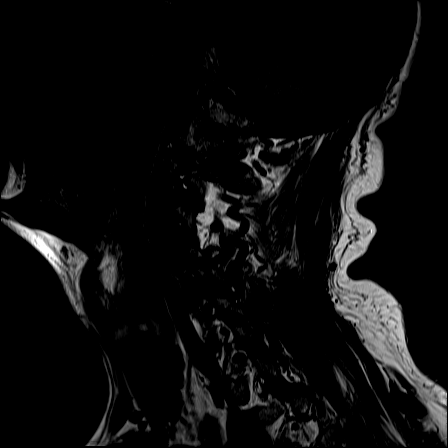
[im 7/13]
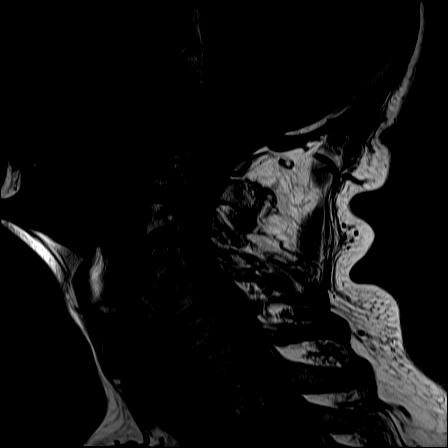
[im 13/13]
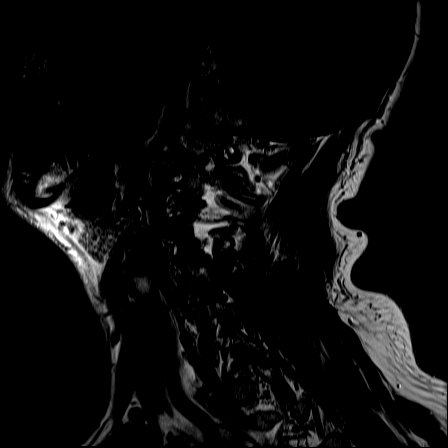

[Series 5: ir sagital · sagittal · 3.0mm · 0.26mm/px · 3 of 13 slices shown]
[im 1/13]
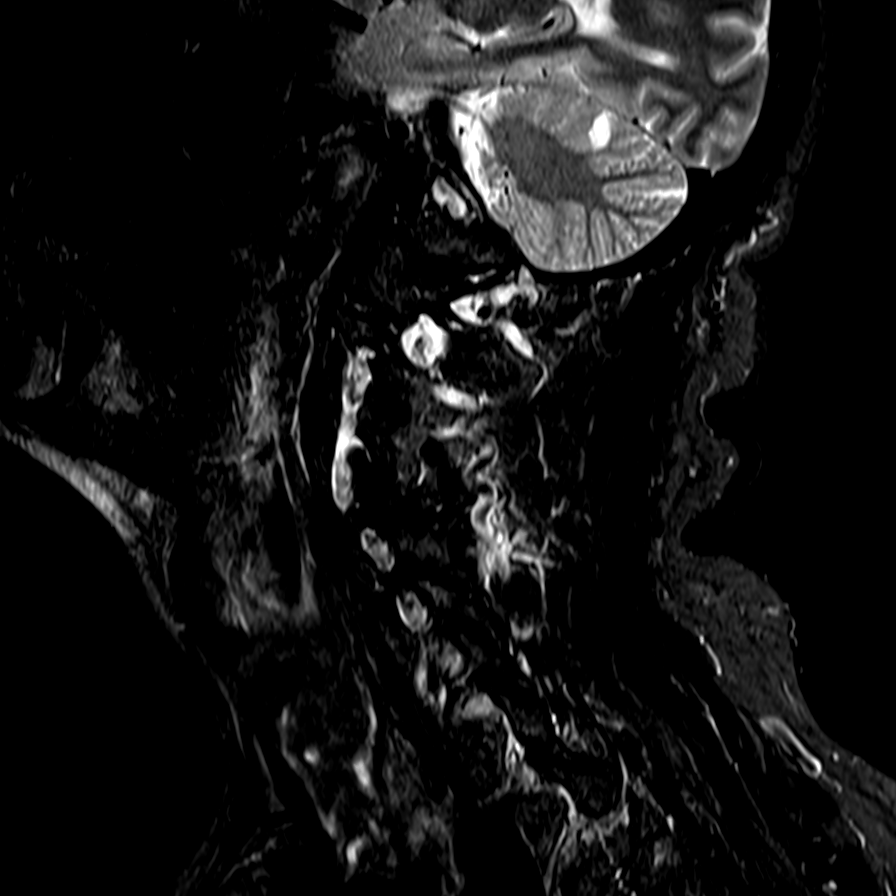
[im 7/13]
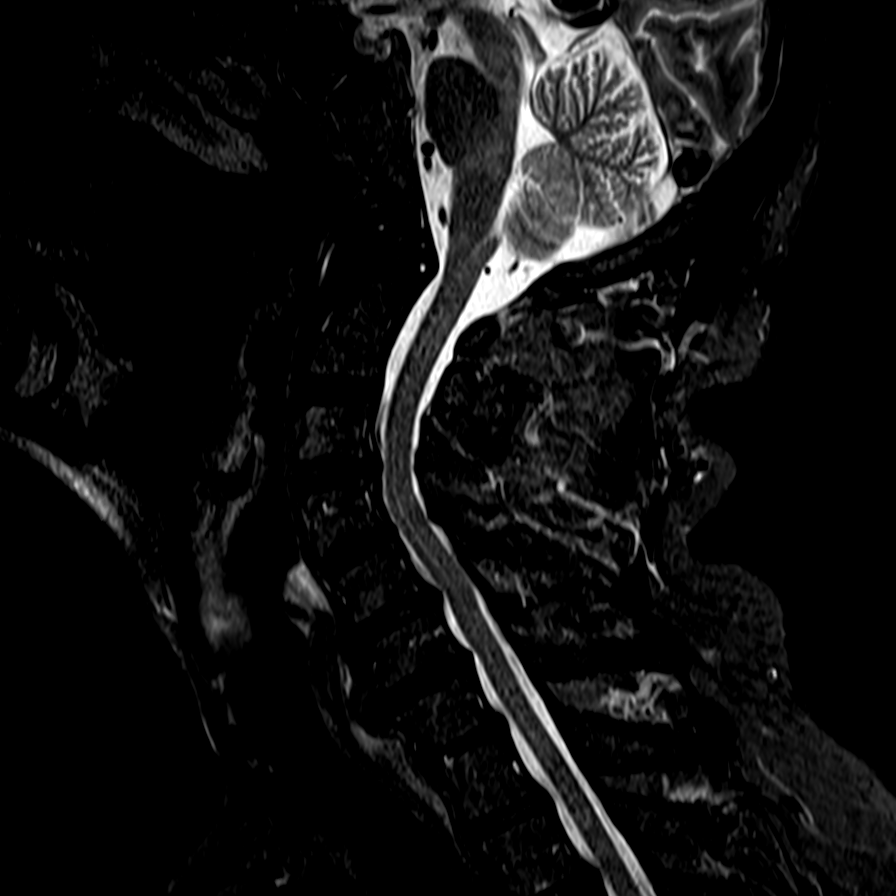
[im 13/13]
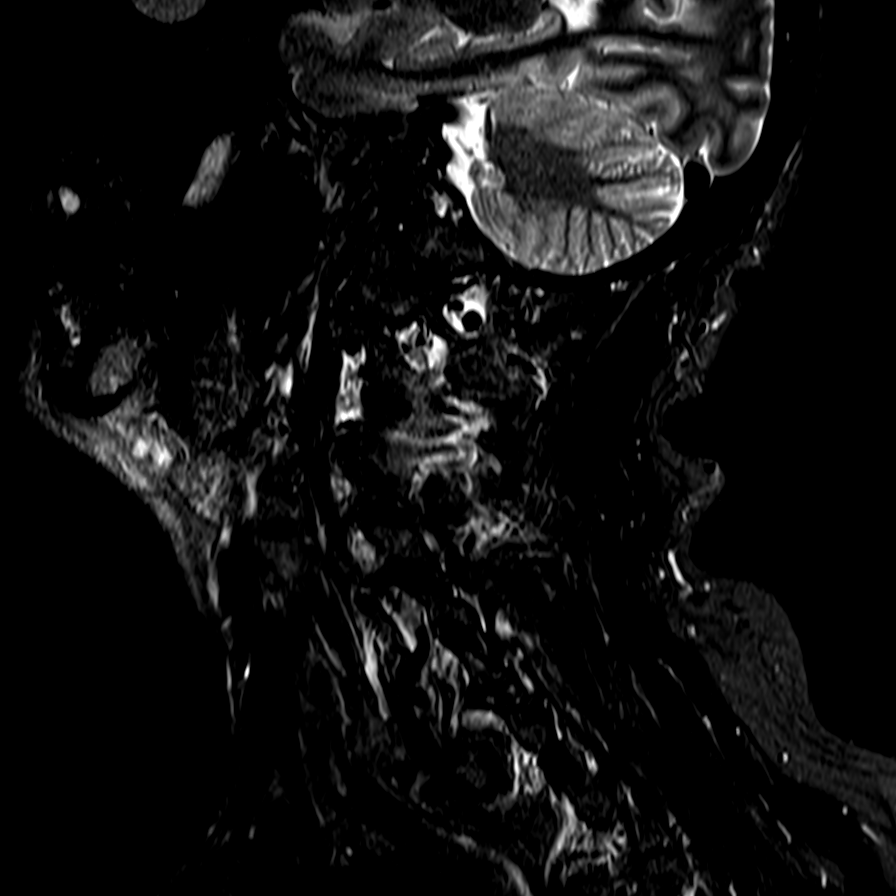

[Series 7: T2 · axial · 3.0mm · 0.25mm/px · z∈[-88,-12]mm · 3 of 36 slices shown (2 of 2)]
[im 6/36]
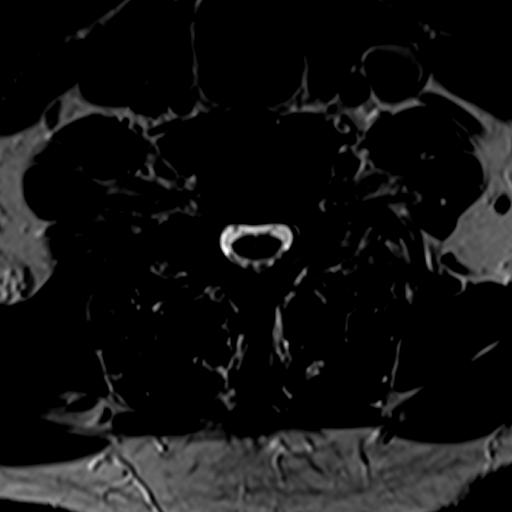
[im 19/36]
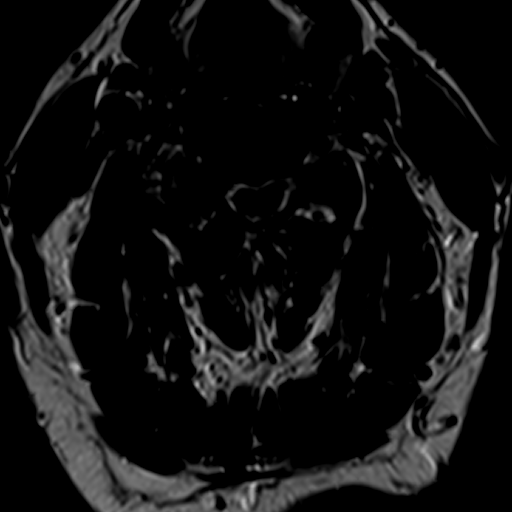
[im 30/36]
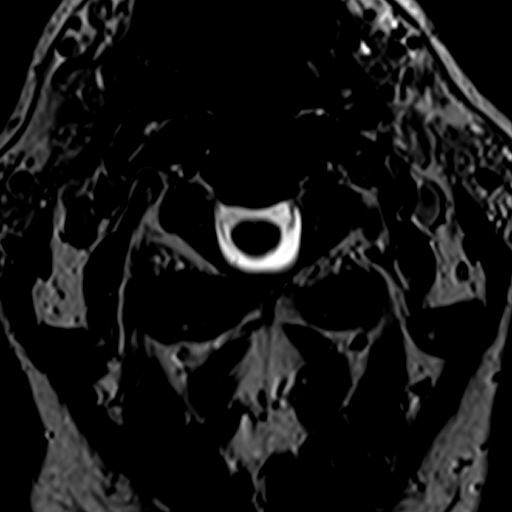

[13 of 48 positions shown; findings below may reference images not displayed]

FINDINGS: Alignment: Mildly exaggerated upper cervical lordosis. 2 mm
anterolisthesis of C4 on C5. 1 mm anterolisthesis of C5 on C6 and C6
on C7 which is probably degenerative.

Vertebrae: No destructive osseous lesions.

Cord: No cord edema or intramedullary lesions.

Posterior Fossa: Small old infarct in the RIGHT cerebellar
hemisphere. Paranasal sinus disease incidentally noted with LEFT
maxillary mucous retention cyst/ polyp.

Vertebral Arteries: LEFT dominant vertebral system. Flow voids
present bilaterally.

Paraspinal tissues: Normal.

Disc levels:

C2-C3: Disc desiccation. LEFT facet arthrosis. Foramina appear
adequately patent.

C3-C4: LEFT eccentric disc osteophyte complex. Bilateral
uncovertebral spurring. LEFT-greater-than-RIGHT foraminal stenosis
potentially affecting both C4 nerves, left-greater-than-right. There
is mild to moderate central stenosis. AP diameter of the central
canal is 8 mm. Flattening of the LEFT ventral cord associated with
disc osteophyte complex. Posterior ligamentum flavum redundancy
contributes to the central stenosis.

C4-C5: Disc desiccation and degeneration. Central disc osteophyte
complex and posterior ligamentum flavum redundancy produce moderate
central stenosis. There is right-greater-than-left bilateral
foraminal stenosis associated with disc, endplate osteophytes and
facet arthrosis. Facet degeneration is greater on the RIGHT than
LEFT. Foraminal stenosis potentially affects both C5 nerves.

C5-C6: Broad-based disc osteophyte complex with moderate central
stenosis. Indentation of the ventral cord. Bilateral foraminal
stenosis due to uncovertebral spurring and facet arthrosis
potentially affects both C6 nerves.

C6-C7: Broad-based disc osteophyte complex and posterior ligamentum
flavum redundancy produce mild central stenosis. Bilateral foraminal
stenosis due to uncovertebral spurring. Foraminal stenosis
potentially affects both C7 nerves.

C7-T1: LEFT eccentric disc osteophyte complex with mild central
stenosis. Moderate RIGHT and mild LEFT foraminal stenosis
potentially affecting both C8 nerves.

Disc bulging is present in the upper thoracic spine as well.
IMPRESSION: Moderate to severe multilevel cervical spondylosis detailed above.
Multilevel foraminal stenosis potentially affects multiple bilateral
nerve roots in this patient with upper extremity radiculopathy.

## 2016-10-12 NOTE — Progress Notes (Signed)
Cardiology Office Note   Date:  10/15/2016   ID:  CACE OSORTO, DOB [birth year], MRN [id number]  PCP:  Jonathon Bellows, MD  Cardiologist:   Jenkins Rouge, MD   No chief complaint on file.     History of Present Illness: Russell [name] is a 71 y.o. male who presents for consultation regarding chest pain. Referred by Maurice Small MD.  He has chronic GERD. Symptoms worse over lat 3 months Non exertional Worse in recliner. He says its different than His reflux. However squeezing sensation improves with sitting up. Knows processed foods and wheat make reflux worse Had ranitidine added to omeprazole and no change so he stopped it. 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 No palpitations, syncope or dyspnea CRF;s family history and previous smoking      Past Medical History:  Diagnosis Date  . Arthritis   . 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 Constipation   . Hemorrhoids   . Maxillary sinus polyp     Past Surgical History:  Procedure Laterality Date  . herniated disk L2-L3  03/08/2010     Current Outpatient Prescriptions  Medication Sig Dispense Refill  . cyclobenzaprine (FLEXERIL) 10 MG tablet Take 10 mg by mouth 3 (three) times daily as needed for muscle spasms.    Marland Kitchen omeprazole (PRILOSEC) 40 MG capsule Take 40 mg by mouth at bedtime.    Cristino Martes Root 500 MG CAPS Take 500 mg by mouth at bedtime as needed. 2 tabs prn     No current facility-administered medications for this visit.     Allergies:   Patient has no known allergies.    Social History:  The patient  reports that he quit smoking about 42 years ago. His smoking use included Cigarettes. He started smoking about 51 years ago. He smoked 0.25 packs per day. He has never used smokeless tobacco. He reports that he drinks alcohol. He reports that he does not use drugs.   Family  History:  The patient's family history includes Aortic aneurysm in his mother and sister; CVA in his sister; Heart attack in his father.    ROS:  Please see the history of present illness.   Otherwise, review of systems are positive for none.   All other systems are reviewed and negative.    PHYSICAL EXAM: VS:  BP 114/66 (BP Location: Right Arm)   Pulse 84   Ht '6\' 2"'$  (1.88 m)   Wt 191 lb (86.6 kg)   SpO2 99%   BMI 24.52 kg/m  , BMI Body mass index is 24.52 kg/m. Affect appropriate Healthy:  appears stated age 33: normal Neck supple with no adenopathy JVP normal no bruits no thyromegaly Lungs clear with no wheezing and good diaphragmatic motion Heart:  S1/S2 no murmur, no rub, gallop or click PMI normal Abdomen: benighn, BS positve, no tenderness, no AAA no bruit.  No HSM or HJR Distal pulses intact with no bruits No edema Neuro non-focal Skin warm and dry No muscular weakness Arthritic changes in hands    EKG:  NSR normal ECG 10/15/16  10/09/13 NSR rate 59 normal ECG    Recent Labs: No results found for requested labs within last 8760 hours.    Lipid Panel    Component Value Date/Time   CHOL  154 02/10/2009 2017   TRIG 77 02/10/2009 2017   HDL 53 02/10/2009 2017   CHOLHDL 2.9 Ratio 02/10/2009 2017   VLDL 15 02/10/2009 2017   LDLCALC 86 02/10/2009 2017      Wt Readings from Last 3 Encounters:  10/15/16 191 lb (86.6 kg)  03/10/09 198 lb (89.8 kg)  02/21/09 195 lb (88.5 kg)      Other studies Reviewed: Additional studies/ records that were reviewed today include: Notes Dr Graciela Husbands CXR 3/17 NAD .    ASSESSMENT AND PLAN:  1.  Chest pain very atypical not likely cardiac Normal ECG f/u ETT 2. GERD: should have GI f/u and get EGD to r/o Barrett's continue omeprazole  3. Arthritis PRN asa and tylenol f/u primary for serology and RA diagnosis .    Current medicines are reviewed at length with the patient today.  The patient does not have concerns  regarding medicines.  The following changes have been made:  no change  Labs/ tests ordered today include: ETT   Orders Placed This Encounter  Procedures  . Exercise Tolerance Test  . EKG 12-Lead     Disposition:   FU with cards PRN if ETT nromal      Signed, Jenkins Rouge, MD  10/15/2016 10:00 AM    [facility] [address], [place], [place]  [address] Phone: [phone]; Fax: [phone]

## 2016-10-15 ENCOUNTER — Ambulatory Visit (INDEPENDENT_AMBULATORY_CARE_PROVIDER_SITE_OTHER): Payer: Medicare Other | Admitting: Cardiovascular Disease

## 2016-10-15 ENCOUNTER — Encounter: Payer: Self-pay | Admitting: Cardiovascular Disease

## 2016-10-15 ENCOUNTER — Encounter: Payer: Self-pay | Admitting: *Deleted

## 2016-10-15 VITALS — BP 114/66 | HR 84 | Ht 74.0 in | Wt 191.0 lb

## 2016-10-15 DIAGNOSIS — R079 Chest pain, unspecified: Secondary | ICD-10-CM | POA: Diagnosis not present

## 2016-10-15 DIAGNOSIS — J338 Other polyp of sinus: Secondary | ICD-10-CM | POA: Insufficient documentation

## 2016-10-15 DIAGNOSIS — I5189 Other ill-defined heart diseases: Secondary | ICD-10-CM | POA: Insufficient documentation

## 2016-10-15 DIAGNOSIS — L409 Psoriasis, unspecified: Secondary | ICD-10-CM | POA: Insufficient documentation

## 2016-10-15 DIAGNOSIS — I10 Essential (primary) hypertension: Secondary | ICD-10-CM | POA: Diagnosis not present

## 2016-10-15 DIAGNOSIS — K623 Rectal prolapse: Secondary | ICD-10-CM

## 2016-10-15 NOTE — Patient Instructions (Signed)
Your physician recommends that you schedule a follow-up appointment As Needed. We will call with Results.   Your physician recommends that you continue on your current medications as directed. Please refer to the Current Medication list given to you today.  Your physician has requested that you have an exercise tolerance test. For further information please visit [email or web address]. Please also follow instruction sheet, as given.   Thank you for choosing [facility]!

## 2016-10-18 ENCOUNTER — Ambulatory Visit: Payer: Self-pay | Admitting: Cardiovascular Disease

## 2016-10-23 ENCOUNTER — Ambulatory Visit (HOSPITAL_COMMUNITY)
Admission: RE | Admit: 2016-10-23 | Discharge: 2016-10-23 | Disposition: A | Discharge status: Home / Self Care | Payer: Medicare Other | Source: Ambulatory Visit | Attending: Cardiovascular Disease | Admitting: Cardiovascular Disease

## 2016-10-23 DIAGNOSIS — R079 Chest pain, unspecified: Secondary | ICD-10-CM | POA: Diagnosis not present

## 2016-10-23 LAB — EXERCISE TOLERANCE TEST
CHL RATE OF PERCEIVED EXERTION: 13
CSEPED: 7 min
CSEPEDS: 1 s
CSEPEW: 10.1 METS
CSEPPHR: 141 {beats}/min
MPHR: 152 {beats}/min
Percent HR: 92 %
Rest HR: 51 {beats}/min

## 2016-10-26 ENCOUNTER — Ambulatory Visit: Payer: Self-pay | Admitting: Cardiology

## 2016-11-01 ENCOUNTER — Emergency Department (HOSPITAL_COMMUNITY)
Admission: EM | Admit: 2016-11-01 | Discharge: 2016-11-01 | Disposition: A | Discharge status: Home / Self Care | Payer: Medicare Other | Attending: Emergency Medicine | Admitting: Emergency Medicine

## 2016-11-01 ENCOUNTER — Encounter (HOSPITAL_COMMUNITY): Payer: Self-pay

## 2016-11-01 ENCOUNTER — Emergency Department (HOSPITAL_COMMUNITY): Payer: Medicare Other

## 2016-11-01 DIAGNOSIS — Z8673 Personal history of transient ischemic attack (TIA), and cerebral infarction without residual deficits: Secondary | ICD-10-CM | POA: Diagnosis not present

## 2016-11-01 DIAGNOSIS — Z87891 Personal history of nicotine dependence: Secondary | ICD-10-CM | POA: Insufficient documentation

## 2016-11-01 DIAGNOSIS — I10 Essential (primary) hypertension: Secondary | ICD-10-CM | POA: Insufficient documentation

## 2016-11-01 DIAGNOSIS — R002 Palpitations: Secondary | ICD-10-CM | POA: Diagnosis present

## 2016-11-01 DIAGNOSIS — I471 Supraventricular tachycardia: Secondary | ICD-10-CM | POA: Diagnosis not present

## 2016-11-01 LAB — CBC WITH DIFFERENTIAL/PLATELET
BASOS ABS: 0 10*3/uL (ref 0.0–0.1)
Basophils Relative: 1 %
EOS PCT: 11 %
Eosinophils Absolute: 0.7 10*3/uL (ref 0.0–0.7)
HCT: 39.7 % (ref 39.0–52.0)
Hemoglobin: 13.4 g/dL (ref 13.0–17.0)
LYMPHS PCT: 30 %
Lymphs Abs: 1.9 10*3/uL (ref 0.7–4.0)
MCH: 25.4 pg — ABNORMAL LOW (ref 26.0–34.0)
MCHC: 33.8 g/dL (ref 30.0–36.0)
MCV: 75.2 fL — ABNORMAL LOW (ref 78.0–100.0)
Monocytes Absolute: 0.5 10*3/uL (ref 0.1–1.0)
Monocytes Relative: 8 %
NEUTROS ABS: 3.1 10*3/uL (ref 1.7–7.7)
NEUTROS PCT: 50 %
PLATELETS: 199 10*3/uL (ref 150–400)
RBC: 5.28 MIL/uL (ref 4.22–5.81)
RDW: 14.3 % (ref 11.5–15.5)
WBC: 6.3 10*3/uL (ref 4.0–10.5)

## 2016-11-01 LAB — BASIC METABOLIC PANEL
Anion gap: 7 (ref 5–15)
BUN: 14 mg/dL (ref 6–20)
CALCIUM: 9.1 mg/dL (ref 8.9–10.3)
CO2: 26 mmol/L (ref 22–32)
Chloride: 107 mmol/L (ref 101–111)
Creatinine, Ser: 0.83 mg/dL (ref 0.61–1.24)
Glucose, Bld: 98 mg/dL (ref 65–99)
Potassium: 3.9 mmol/L (ref 3.5–5.1)
SODIUM: 140 mmol/L (ref 135–145)

## 2016-11-01 LAB — I-STAT TROPONIN, ED
TROPONIN I, POC: 0 ng/mL (ref 0.00–0.08)
TROPONIN I, POC: 0 ng/mL (ref 0.00–0.08)

## 2016-11-01 LAB — MAGNESIUM: MAGNESIUM: 2.1 mg/dL (ref 1.7–2.4)

## 2016-11-01 NOTE — Discharge Instructions (Signed)
You converted yourself out of SVT today. Your heart work-up and blood work is reassuring.  Please return without fail for worsening symptoms, including unremitting palpations, passing out, difficulty breathing, escalating chest pains or any other symptoms concerning to you.

## 2016-11-01 NOTE — ED Provider Notes (Signed)
[facility] DEPT Provider Note   CSN: [id number] Arrival date & time: 11/01/16  1138     History   Chief Complaint Chief Complaint  Patient presents with  . Tachycardia    HPI Russell [name] is a 71 y.o. male.  HPI 71 year old male who presents with palpitations. He has a prior history of CVA and hypertension. Reports that he has been dealing with episodes of chest pressure, primarily at nighttime when he is lying back. Has been seen by a cardiologist and was told that this is not cardiac and likely esophageal. States that he drinks coffee on an empty stomach this morning and noticed chest pressure. Does not have any difficulty breathing syncope or near syncope. Shortly after that began to have palpitations, that were persistent after 1-2 hours and so he came to the ED for evaluation. States that occasionally he does have palpitations that last for about 30 seconds before self resolving. Denies any leg swelling or leg pain, recent immobilization. Denies heavy caffeine intake, nausea, vomiting, diarrhea, or recent upper respiratory or GI illness. Did not take anything for symptoms prior to arrival. No aggravating or alleviating factors.   Past Medical History:  Diagnosis Date  . Arthritis   . 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 Constipation   . Hemorrhoids   . Maxillary sinus polyp     Patient Active Problem List   Diagnosis Date Noted  . Chest pain 10/15/2016  . Rectal prolapse 10/15/2016  . Maxillary sinus polyp 10/15/2016  . RHA (rheumatoid arthritis) ([place]) 10/15/2016  . Diastolic dysfunction 57/84/6962  . Psoriasis 10/15/2016  . CONTUSION, [place] 03/10/2009  . DEGENERATIVE JOINT DISEASE 02/07/2009  . HEADACHE 02/07/2009  . ABDOMINAL PAIN, LEFT LOWER QUADRANT 02/07/2009  . GERD 10/02/2007  . HYPERTENSION, BENIGN ESSENTIAL 10/08/2006  . 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 ERECTILE DYSFUNCTION 09/10/2006  . HEMORRHOIDS, INTERNAL W/O COMPLICATION 95/28/4132  . PLANTAR  FASCIITIS, BILATERAL 09/10/2006  . SLEEP APNEA 09/10/2006  . MALAISE AND FATIGUE 09/10/2006    Past Surgical History:  Procedure Laterality Date  . herniated disk L2-L3  03/08/2010       Home Medications    Prior to Admission medications   Medication Sig Start Date End Date Taking? Authorizing Provider  cyclobenzaprine (FLEXERIL) 10 MG tablet Take 10 mg by mouth 3 (three) times daily as needed for muscle spasms.   Yes Historical Provider, MD  NON FORMULARY 1 mL. Mushroom powder. One teaspoonful every morning.   Yes Historical Provider, MD  omeprazole (PRILOSEC) 40 MG capsule Take 40 mg by mouth at bedtime.   Yes Historical Provider, MD  VALERIAN ROOT PO Take 2 each by mouth at bedtime.   Yes Historical Provider, MD    Family History Family History  Problem Relation Age of Onset  . Aortic aneurysm Mother   . Heart attack Father   . Aortic aneurysm Sister   . CVA Sister     Social History Social History  Substance Use Topics  . Smoking status: Former Smoker    Packs/day: 0.25    Types: Cigarettes    Start date: 10/15/1965    Quit date: 10/16/1974  . Smokeless tobacco: Never Used  . Alcohol use Yes     Comment: occasional wine     Allergies   Patient has no known allergies.   Review of Systems Review of Systems  Constitutional: Negative for fever.  HENT: Negative for congestion.   Respiratory: Negative for shortness of breath.   Cardiovascular: Negative for  leg swelling.  Gastrointestinal: Negative for abdominal pain.  Genitourinary: Negative for difficulty urinating.  Allergic/Immunologic: Negative for immunocompromised state.  Neurological: Negative for syncope and light-headedness.  Hematological: Does not bruise/bleed easily.  Psychiatric/Behavioral: Negative for confusion.  All other systems reviewed and are negative.    Physical Exam Updated Vital Signs BP 109/68   Pulse (!) 59   Temp 98.1 F (36.7 C)   Resp 14   SpO2 97%   Physical  Exam Physical Exam  Nursing note and vitals reviewed. Constitutional: Well developed, well nourished, non-toxic, and in no acute distress Head: Normocephalic and atraumatic.  Mouth/Throat: Oropharynx is clear and moist.  Neck: Normal range of motion. Neck supple.  Cardiovascular: Normal rate and regular rhythm.   Pulmonary/Chest: Effort normal and breath sounds normal.  Abdominal: Soft. There is no tenderness. There is no rebound and no guarding.  Musculoskeletal: Normal range of motion.  no calf tenderness. No lower extremity edema.  Neurological: Alert, no facial droop, fluent speech, moves all extremities symmetrically Skin: Skin is warm and dry.  Psychiatric: Cooperative   ED Treatments / Results  Labs (all labs ordered are listed, but only abnormal results are displayed) Labs Reviewed  CBC WITH DIFFERENTIAL/PLATELET - Abnormal; Notable for the following:       Result Value   MCV 75.2 (*)    MCH 25.4 (*)    All other components within normal limits  BASIC METABOLIC PANEL  MAGNESIUM  I-STAT TROPOININ, ED  I-STAT TROPOININ, ED    EKG  EKG Interpretation  Date/Time:  Thursday November 01 2016 12:01:41 EDT Ventricular Rate:  78 PR Interval:    QRS Duration: 96 QT Interval:  373 QTC Calculation: 425 R Axis:   53 Text Interpretation:  Sinus rhythm Converted SVT from prior. Confirmed by Jeneen Rinks  MD, [place] ([id number]) on 11/01/2016 2:33:36 PM       Radiology Dg Chest 2 View  Result Date: 11/01/2016 CLINICAL DATA:  Palpitations, chest pressure, SVT now resolved EXAM: CHEST  2 VIEW COMPARISON:  None. FINDINGS: Normal heart size and mediastinal contours. Borderline hyperinflation. No acute infiltrate or edema. No effusion or pneumothorax. No acute osseous findings. IMPRESSION: No evidence of active disease. Electronically Signed   By: Monte Fantasia M.D.   On: 11/01/2016 12:55    Procedures Procedures (including critical care time)  Medications Ordered in ED Medications - No  data to display   Initial Impression / Assessment and Plan / ED Course  I have reviewed the triage vital signs and the nursing notes.  Pertinent labs & imaging results that were available during my care of the patient were reviewed by me and considered in my medical decision making (see chart for details).     Presenting with chest pressure and palpitations. On arrival, patient had triage EKG that was suggestive of SVT. During IV placement, patient bagels, and self conspired it himself out of SVT. Repeat EKG shows sinus rhythm without acute ischemia or heart strain. He feels back to normal, and is asymptomatic. Blood work without major Research officer, political party or metabolic derangements. His chest pain, seems GI related in that it comes on when he is lying flat, and when eating acidic foods on an empty stomach. Unclear if it may be related to his SVT today as well. Serial troponin normal and presentation not felt to be ACS. The patient has already scheduled follow-up with PCP and under one week.  Strict return and follow-up instructions reviewed. He expressed understanding of all discharge instructions and  felt comfortable with the plan of care.   Final Clinical Impressions(s) / ED Diagnoses   Final diagnoses:  SVT (supraventricular tachycardia) ([place])    New Prescriptions New Prescriptions   No medications on file     Forde Dandy, MD 11/01/16 1520

## 2016-11-01 NOTE — ED Triage Notes (Signed)
Patient presented to ED with increased HR C/O chest pain/pressure and some SOB at times.  EKG given to Dr. Viviana Simpler and patient moved to exam room.

## 2017-07-16 DIAGNOSIS — D649 Anemia, unspecified: Secondary | ICD-10-CM

## 2017-07-16 HISTORY — DX: Anemia, unspecified: D64.9

## 2017-12-20 ENCOUNTER — Encounter: Payer: Self-pay | Admitting: Family

## 2017-12-20 ENCOUNTER — Ambulatory Visit: Payer: Medicare Other | Admitting: Family

## 2017-12-20 VITALS — BP 118/80 | HR 48 | Temp 97.6°F | Ht 74.0 in | Wt 183.1 lb

## 2017-12-20 DIAGNOSIS — G8929 Other chronic pain: Secondary | ICD-10-CM

## 2017-12-20 DIAGNOSIS — M545 Low back pain: Secondary | ICD-10-CM | POA: Diagnosis not present

## 2017-12-20 DIAGNOSIS — G629 Polyneuropathy, unspecified: Secondary | ICD-10-CM | POA: Diagnosis not present

## 2017-12-20 DIAGNOSIS — M069 Rheumatoid arthritis, unspecified: Secondary | ICD-10-CM | POA: Diagnosis not present

## 2017-12-20 DIAGNOSIS — M542 Cervicalgia: Secondary | ICD-10-CM | POA: Diagnosis not present

## 2017-12-20 DIAGNOSIS — M6281 Muscle weakness (generalized): Secondary | ICD-10-CM | POA: Diagnosis not present

## 2017-12-20 MED ORDER — CYCLOBENZAPRINE HCL 10 MG PO TABS: 10.0000 mg | ORAL_TABLET | Freq: Three times a day (TID) | ORAL | 1 refills | Status: DC | PRN

## 2017-12-20 MED ORDER — NAPROXEN 500 MG PO TABS: 500.0000 mg | ORAL_TABLET | Freq: Two times a day (BID) | ORAL | 1 refills | Status: DC

## 2017-12-20 NOTE — Progress Notes (Signed)
Russell [name] is a 72 y.o. male with the following history as recorded in [employer]:  Patient Active Problem List   Diagnosis Date Noted  . Chest pain 10/15/2016  . Rectal prolapse 10/15/2016  . Maxillary sinus polyp 10/15/2016  . RHA (rheumatoid arthritis) ([place]) 10/15/2016  . Diastolic dysfunction 31/54/0086  . Psoriasis 10/15/2016  . CONTUSION, [place] 03/10/2009  . DEGENERATIVE JOINT DISEASE 02/07/2009  . HEADACHE 02/07/2009  . ABDOMINAL PAIN, LEFT LOWER QUADRANT 02/07/2009  . GERD 10/02/2007  . HYPERTENSION, BENIGN ESSENTIAL 10/08/2006  . 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 ERECTILE DYSFUNCTION 09/10/2006  . HEMORRHOIDS, INTERNAL W/O COMPLICATION 76/19/5093  . PLANTAR FASCIITIS, BILATERAL 09/10/2006  . SLEEP APNEA 09/10/2006  . MALAISE AND FATIGUE 09/10/2006    Current Outpatient Medications  Medication Sig Dispense Refill  . NON FORMULARY 1 mL. Mushroom powder. One teaspoonful every morning.    Marland Kitchen omeprazole (PRILOSEC) 40 MG capsule Take 40 mg by mouth at bedtime.    . cyclobenzaprine (FLEXERIL) 10 MG tablet Take 1 tablet (10 mg total) by mouth 3 (three) times daily as needed for muscle spasms. 30 tablet 1  . naproxen (NAPROSYN) 500 MG tablet Take 1 tablet (500 mg total) by mouth 2 (two) times daily with a meal. Prn for pain 60 tablet 1   No current facility-administered medications for this visit.     Allergies: Patient has no known allergies.  Past Medical History:  Diagnosis Date  . Arthritis   . 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 Constipation   . Hemorrhoids   . Maxillary sinus polyp     Past Surgical History:  Procedure Laterality Date  . herniated disk L2-L3  03/08/2010    Family History  Problem Relation Age of Onset  . Aortic aneurysm Mother   . Heart attack Father   . Aortic aneurysm Sister   . CVA Sister     Social History   Tobacco Use  . Smoking status: Former Smoker    Packs/day: 0.25    Types: Cigarettes    Start date: 10/15/1965    Last attempt  to quit: 10/16/1974    Years since quitting: 43.2  . Smokeless tobacco: Never Used  Substance Use Topics  . Alcohol use: Yes    Comment: occasional wine    Subjective:  Patient presents to establish care today; is accompanied by his wife today; has been having care [facility] through [facility] PCP; wants to transfer into [facility] system; history of SVT- felt to be caffeine related; has been working to limit caffeine intake and no further symptoms; last saw his cardiologist in 10/2016 and was told did not need further follow-up;  Feels that diet very good- protein based/ limits carbs;   Does have history of RA- good improvement in symptoms with limitation of wheat; did not respond to Methotrexate/ injections; [facility]); opted against treatment due to concerns for side effects;  History of low back surgery- 2011; chronic neuropathy issues- hesitant to start Gabapentin at this time; would be interested in seeing sports medicine;   Last colonoscopy in 2003- would like to discuss other alternatives;     Objective:  Vitals:   12/20/17 1112  BP: 118/80  Pulse: (!) 48  Temp: 97.6 F (36.4 C)  TempSrc: Oral  SpO2: 99%  Weight: 183 lb 1.9 oz (83.1 kg)  Height: 6\' 2"  (1.88 m)    General: Well developed, well nourished, in no acute distress  Skin : Warm and dry.  Head: Normocephalic and atraumatic  Lungs: Respirations unlabored; clear to auscultation bilaterally without wheeze, rales, rhonchi  CVS exam: normal rate and regular rhythm.  Abdomen: Soft; nontender; nondistended; normoactive bowel sounds; no masses or hepatosplenomegaly  Musculoskeletal: No deformities; no active joint inflammation; marked swelling noted bilaterally in fingers Extremities: No edema, cyanosis, clubbing  Vessels: Symmetric bilaterally  Neurologic: Alert and oriented; speech intact; face symmetrical; moves all extremities well; CNII-XII intact without focal deficit  Assessment:  1. Rheumatoid arthritis,  involving unspecified site, unspecified rheumatoid factor presence ([place])   2. Chronic low back pain without sciatica, unspecified back pain laterality   3. Neuropathy   4. Muscle weakness of lower extremity   5. Musculoskeletal neck pain     Plan:  1. [facility]/ treatment discussion; refill on Naproxen to use as needed; 2. Follow-up with Dr. Tamala Julian to discuss treatment plan; 5. With secondary headaches; refill on Flexeril to use prn;  Information given regarding Cologuard- he will check on coverage from his insurance and call back with how he would like to proceed;   Records are being transferred- need to update immunization status.   Return for Dr. Tamala Julian chronic low back pain/ neuropathy.  Orders Placed This Encounter  Procedures  . Ambulatory referral to Rheumatology    Referral Priority:   Routine    Referral Type:   Consultation    Referral Reason:   Specialty Services Required    Referred to Provider:   Bo Merino, MD    Requested Specialty:   Rheumatology    Number of Visits Requested:   1    Requested Prescriptions   Signed Prescriptions Disp Refills  . cyclobenzaprine (FLEXERIL) 10 MG tablet 30 tablet 1    Sig: Take 1 tablet (10 mg total) by mouth 3 (three) times daily as needed for muscle spasms.  . naproxen (NAPROSYN) 500 MG tablet 60 tablet 1    Sig: Take 1 tablet (500 mg total) by mouth 2 (two) times daily with a meal. Prn for pain

## 2018-01-06 NOTE — Progress Notes (Signed)
[facility] [address], [place] [address] Phone: [phone] Subjective:    I'm seeing this patient by the request  of:  Marrian Salvage, FNP   CC: Back pain  [id number]  Russell [name] is a 72 y.o. male coming in with complaint of low back pain.  History of unfortunately lumbar radiculopathy on the contralateral side.  Patient needed surgical intervention.  Patient states that his left leg feels about the same but not as much pain in the back.  Weakness of the leg with no noted.  Patient with sometimes feels like there is been instability.  Having no muscle foot drop.  Having difficulty extending the toe on a regular basis.  Patient is concerned that he is going to fall.  Seems like the leg weakness around the amount of time.  Patient did have x-rays today.  X-rays show the patient does have some degenerative changes mostly at L5-S1 but does not displace narrowing at all levels of the lumbar spine.  This does show worsening from previous exam.     Past Medical History:  Diagnosis Date  . Arthritis   . 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 Constipation   . Hemorrhoids   . Maxillary sinus polyp    Past Surgical History:  Procedure Laterality Date  . herniated disk L2-L3  03/08/2010   Social History   Socioeconomic History  . Marital status: Unknown    Spouse name: Not on file  . Number of children: Not on file  . Years of education: Not on file  . Highest education level: Not on file  Occupational History  . Not on file  Social Needs  . Financial resource strain: Not on file  . Food insecurity:    Worry: Not on file    Inability: Not on file  . Transportation needs:    Medical: Not on file    Non-medical: Not on file  Tobacco Use  . Smoking status: Former Smoker    Packs/day: 0.25    Types: Cigarettes    Start date: 10/15/1965    Last attempt to quit: 10/16/1974    Years since quitting: 43.2  . Smokeless tobacco: Never Used   Substance and Sexual Activity  . Alcohol use: Yes    Comment: occasional wine  . Drug use: No  . Sexual activity: Not on file  Lifestyle  . Physical activity:    Days per week: Not on file    Minutes per session: Not on file  . Stress: Not on file  Relationships  . Social connections:    Talks on phone: Not on file    Gets together: Not on file    Attends religious service: Not on file    Active member of club or organization: Not on file    Attends meetings of clubs or organizations: Not on file    Relationship status: Not on file  Other Topics Concern  . Not on file  Social History Narrative  . Not on file   No Known Allergies Family History  Problem Relation Age of Onset  . Aortic aneurysm Mother   . Heart attack Father   . Aortic aneurysm Sister   . CVA Sister      Past medical history, social, surgical and family history all reviewed in electronic medical record.  No pertanent information unless stated regarding to the chief complaint.   Review of Systems:Review of systems updated and as accurate as  of 01/07/18  No headache, visual changes, nausea, vomiting, diarrhea, constipation, dizziness, abdominal pain, skin rash, fevers, chills, night sweats, weight loss, swollen lymph nodes, body aches, joint swelling, chest pain, shortness of breath, mood changes.  Positive muscle aches  Objective  Blood pressure 110/78, pulse 67, height 6\' 2"  (1.88 m), weight 184 lb (83.5 kg), SpO2 94 %. Systems examined below as of 01/07/18   General: No apparent distress alert and oriented x3 mood and affect normal, dressed appropriately.  HEENT: Pupils equal, extraocular movements intact  Respiratory: Patient's speak in full sentences and does not appear short of breath  Cardiovascular: No lower extremity edema, non tender, no erythema  Skin: Warm dry intact with no signs of infection or rash on extremities or on axial skeleton.  Abdomen: Soft nontender  Neuro: Cranial nerves II  through XII are intact, neurovascularly intact in all extremities with 2+ DTRs and 2+ pulses.  Lymph: No lymphadenopathy of posterior or anterior cervical chain or axillae bilaterally.  Gait antalgic with a left Trendelenburg MSK:  Non tender with full range of motion and good stability and symmetric strength and tone of shoulders, elbows, wrist, hip, knee and ankles bilaterally.  Mild arthritic changes of multiple joints patient does have some mild weakness of the left leg and tone seems to be a little bit more hypotonic of the hamstring as well as calf compared to the contralateral side patient does have some mild swelling of the MCP joints of the hands bilaterally  Patient back exam shows loss of lordosis.  Mild tenderness over the spinal musculature lumbar spine.  Does have some limited extension but otherwise full range of motion.  Negative straight leg test but patient does have weakness especially with dorsiflexion of the foot and toe.  [id number]; 15 additional minutes spent for Therapeutic exercises as stated in above notes.  This included exercises focusing on stretching, strengthening, with significant focus on eccentric aspects.   Long term goals include an improvement in range of motion, strength, endurance as well as avoiding reinjury. Patient's frequency would include in 1-2 times a day, 3-5 times a week for a duration of 6-12 weeks. Low back exercises that included:  Pelvic tilt/bracing instruction to focus on control of the pelvic girdle and lower abdominal muscles  Glute strengthening exercises, focusing on proper firing of the glutes without engaging the low back muscles Proper stretching techniques for maximum relief for the hamstrings, hip flexors, low back and some rotation where tolerated  Proper technique shown and discussed handout in great detail with ATC.  All questions were discussed and answered.     Impression and Recommendations:     This case required medical decision  making of moderate complexity.      Note: This dictation was prepared with Dragon dictation along with smaller phrase technology. Any transcriptional errors that result from this process are unintentional.

## 2018-01-07 ENCOUNTER — Ambulatory Visit (INDEPENDENT_AMBULATORY_CARE_PROVIDER_SITE_OTHER)
Admission: RE | Admit: 2018-01-07 | Discharge: 2018-01-07 | Disposition: A | Discharge status: Home / Self Care | Payer: Medicare Other | Source: Ambulatory Visit | Attending: Family Medicine | Admitting: Family Medicine

## 2018-01-07 ENCOUNTER — Encounter: Payer: Self-pay | Admitting: Family Medicine

## 2018-01-07 ENCOUNTER — Ambulatory Visit: Payer: Medicare Other | Admitting: Family Medicine

## 2018-01-07 VITALS — BP 110/78 | HR 67 | Ht 74.0 in | Wt 184.0 lb

## 2018-01-07 DIAGNOSIS — M5416 Radiculopathy, lumbar region: Secondary | ICD-10-CM

## 2018-01-07 DIAGNOSIS — G8929 Other chronic pain: Secondary | ICD-10-CM

## 2018-01-07 DIAGNOSIS — M545 Low back pain: Secondary | ICD-10-CM

## 2018-01-07 MED ORDER — VITAMIN D (ERGOCALCIFEROL) 1.25 MG (50000 UNIT) PO CAPS: 50000.0000 [IU] | ORAL_CAPSULE | ORAL | 0 refills | Status: DC

## 2018-01-07 MED ORDER — GABAPENTIN 100 MG PO CAPS: 200.0000 mg | ORAL_CAPSULE | Freq: Every day | ORAL | 3 refills | Status: DC

## 2018-01-07 MED ORDER — PREDNISONE 50 MG PO TABS
50.0000 mg | ORAL_TABLET | Freq: Every day | ORAL | 0 refills | Status: DC
Start: 2018-01-07 — End: 2018-01-07

## 2018-01-07 MED ORDER — PREDNISONE 50 MG PO TABS: 50.0000 mg | ORAL_TABLET | Freq: Every day | ORAL | 0 refills | Status: DC

## 2018-01-07 NOTE — Patient Instructions (Signed)
Good to see you  Gabapentin 200mg  at night Prednisone daily for 5 days Once weekly vitamin D for 12 weeks Over the counter get  Turmeric 500mg  daily  Tart cherry extract any dose at night B12 1043mcg daily  B6 100mg  daily  Iron 65mg  with 500mg  of vitamin C  Lets hold on exercises for now but get xray downstairs See me again in 2-4 weeks to make sure you are doing well

## 2018-01-07 NOTE — Assessment & Plan Note (Signed)
Likely some lumbar radiculopathy.  Discussed icing regimen and home exercise.  Discussed with activities of doing which wants to avoid.  Patient given gabapentin for the radicular symptoms.  Patient does have may be an underlying rheumatoid arthritis but is no longer being treated at the moment.  Discussed topical anti-inflammatories and once weekly vitamin D.  Discussed the possibility of necessary laboratory work-up.  No improvement has been suggested and advanced imaging with an MRI and possible injections could be warranted.  Patient is in agreement with the plan.

## 2018-01-17 ENCOUNTER — Telehealth: Payer: Self-pay | Admitting: Family

## 2018-01-17 NOTE — Telephone Encounter (Signed)
I received a note from Dr. Kirke Corin office ( the rheumatologist) we have referred him to. They would like his former rheumatology notes for evaluation if possible. Can he stop by our office and sign a records release? Can be done at his convenience.

## 2018-01-20 NOTE — Telephone Encounter (Signed)
Patient coming by tomorrow to sign release for [facility]. In the meantime he has a [employer] home exercise program. He would like to start using it for his legs and wanted to know if this was ok. He stated that his legs get weak when he doesn't use them so just wanted to get your input.  Please advise  Thanks

## 2018-01-20 NOTE — Telephone Encounter (Signed)
It looks like Dr. Tamala Julian gave him an exercise regimen to follow at his last OV with him; would try to follow that as much as possible.

## 2018-01-20 NOTE — Telephone Encounter (Signed)
Emailed response to patient per his request.

## 2018-01-28 ENCOUNTER — Telehealth: Payer: Self-pay

## 2018-01-28 DIAGNOSIS — Z1211 Encounter for screening for malignant neoplasm of colon: Secondary | ICD-10-CM

## 2018-01-28 NOTE — Telephone Encounter (Signed)
He was supposed to call and check to make sure about coverage and then let us know if he wanted to proceed. I am fine with ordering it for him but we can't answer questions about cost of the test.   I don't know how this works now that we have the website access. Do you remember that piece?

## 2018-01-28 NOTE — Telephone Encounter (Signed)
Copied from [place] [id number]. Topic: Quick Communication - See Telephone Encounter >> Jan 28, 2018 10:05 AM Antonieta Iba C wrote: CRM for notification. See Telephone encounter for: 01/28/18.  Pt called in to be advised. Pt says that he was told to complete a colo guard but has not received one, please advise pt further.    CB: [phone]

## 2018-01-29 NOTE — Telephone Encounter (Signed)
Spoke with patient. He is going to check with insurance and call me back if he wants to proceed.

## 2018-01-29 NOTE — Telephone Encounter (Signed)
Pt would like to proceed with getting cologuard. Pt has check with his insurance it is covered

## 2018-01-30 NOTE — Addendum Note (Signed)
Addended by: Marcina Millard on: 01/30/2018 11:14 AM   Modules accepted: Orders

## 2018-01-30 NOTE — Telephone Encounter (Signed)
Cologuard order faxed in today.

## 2018-02-03 NOTE — Progress Notes (Signed)
[facility] [address] [place], [place] [address] Phone: [phone] Subjective:     CC: back pain   [id number]  Russell [name] is a 72 y.o. male coming in with complaint of back pain. Used prednisone and that helped decrease the pain in his hands. His back pain when he walks his dog up the road or is lifting something. Patient continues to wear an ankle brace on the right foot due to a fallen arch. Patient continues to use gabapentin nightly. He feels that the tingling in the left leg has dissipated but that gabapentin does not improve the pain in the middle toe, left foot. Notices that his toe does turn purple without weight bearing.  Patient states that he does not want to take any of his rheumatoid arthritis medications.  Continues to try to do things naturally.  He is with pain though most days but not enough to stop him from activities.  Seems to be his hands sometimes, back sometimes, feet.  Patient did have x-rays at last exam that were independently visualized by me.  X-rays some mild osteoarthritic changes.    Past Medical History:  Diagnosis Date  . Arthritis   . 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 Constipation   . Hemorrhoids   . Maxillary sinus polyp    Past Surgical History:  Procedure Laterality Date  . herniated disk L2-L3  03/08/2010   Social History   Socioeconomic History  . Marital status: Unknown    Spouse name: Not on file  . Number of children: Not on file  . Years of education: Not on file  . Highest education level: Not on file  Occupational History  . Not on file  Social Needs  . Financial resource strain: Not on file  . Food insecurity:    Worry: Not on file    Inability: Not on file  . Transportation needs:    Medical: Not on file    Non-medical: Not on file  Tobacco Use  . Smoking status: Former Smoker    Packs/day: 0.25    Types: Cigarettes    Start date: 10/15/1965    Last attempt to quit: 10/16/1974    Years  since quitting: 43.3  . Smokeless tobacco: Never Used  Substance and Sexual Activity  . Alcohol use: Yes    Comment: occasional wine  . Drug use: No  . Sexual activity: Not on file  Lifestyle  . Physical activity:    Days per week: Not on file    Minutes per session: Not on file  . Stress: Not on file  Relationships  . Social connections:    Talks on phone: Not on file    Gets together: Not on file    Attends religious service: Not on file    Active member of club or organization: Not on file    Attends meetings of clubs or organizations: Not on file    Relationship status: Not on file  Other Topics Concern  . Not on file  Social History Narrative  . Not on file   No Known Allergies Family History  Problem Relation Age of Onset  . Aortic aneurysm Mother   . Heart attack Father   . Aortic aneurysm Sister   . CVA Sister      Past medical history, social, surgical and family history all reviewed in electronic medical record.  No pertanent information unless stated regarding to the chief complaint.   Review  of Systems:Review of systems updated and as accurate as of 02/04/18  No headache, visual changes, nausea, vomiting, diarrhea, constipation, dizziness, abdominal pain, skin rash, fevers, chills, night sweats, weight loss, swollen lymph nodes,  chest pain, shortness of breath, mood changes.  Positive muscle aches, joint swelling, body aches  Objective  Blood pressure 118/76, pulse 61, height 6\' 2"  (1.88 m), weight 185 lb (83.9 kg), SpO2 98 %. Systems examined below as of 02/04/18   General: No apparent distress alert and oriented x3 mood and affect normal, dressed appropriately.  HEENT: Pupils equal, extraocular movements intact  Respiratory: Patient's speak in full sentences and does not appear short of breath  Cardiovascular: No lower extremity edema, non tender, no erythema  Skin: Warm dry intact with no signs of infection or rash on extremities or on axial skeleton.    Abdomen: Soft nontender  Neuro: Cranial nerves II through XII are intact, neurovascularly intact in all extremities with 2+ DTRs and 2+ pulses.  Lymph: No lymphadenopathy of posterior or anterior cervical chain or axillae bilaterally.  Gait antalgic MSK:  tender with limited range of motion and good stability and symmetric strength and tone of shoulders, elbows, wrist, hip, knee bilaterally.  Mild weakness of the left lower extremity with 4+ out of 5 strength compared to the contralateral side  Back exam loss of lordosis with some degenerative scoliosis.  Patient does have tenderness to palpation the paraspinal musculature lumbar spine.  Positive straight leg test on the left side.  Tenderness in the piriformis muscle bilaterally    Impression and Recommendations:     This case required medical decision making of moderate complexity.      Note: This dictation was prepared with Dragon dictation along with smaller phrase technology. Any transcriptional errors that result from this process are unintentional.

## 2018-02-04 ENCOUNTER — Encounter: Payer: Self-pay | Admitting: Family Medicine

## 2018-02-04 ENCOUNTER — Ambulatory Visit: Payer: Medicare Other | Admitting: Family Medicine

## 2018-02-04 VITALS — BP 118/76 | HR 61 | Ht 74.0 in | Wt 185.0 lb

## 2018-02-04 DIAGNOSIS — M5416 Radiculopathy, lumbar region: Secondary | ICD-10-CM

## 2018-02-04 NOTE — Assessment & Plan Note (Signed)
Patient is taking gabapentin on a regular basis and encourage patient to do so.  Hopefully that this will help out.  Encourage patient continue the vitamin D supplementation.  Patient will be referred to physical therapy as well.  Patient wants to avoid any type of injections.  I do believe that the underlying rheumatoid arthritis is likely contributing to some of the discomfort and pain as well.  We discussed icing regimen and home exercises.  Patient will continue this as well.  Follow-up again in 8 weeks  Spent  25 minutes with patient face-to-face and had greater than 50% of counseling including as described above in assessment and plan.

## 2018-02-04 NOTE — Patient Instructions (Signed)
Good to see you  Overall doing well  Ice is your friend.  Keep up with the nutrition  Up to you on the gabapentin  Stay active If walking use the walking poles.  PT should be calling you but ask her as well if we need to send anything else See em agai in 8 weeks (30 minute appointment)

## 2018-02-10 ENCOUNTER — Encounter: Payer: Self-pay | Admitting: Family Medicine

## 2018-02-13 DIAGNOSIS — R195 Other fecal abnormalities: Secondary | ICD-10-CM

## 2018-02-17 ENCOUNTER — Other Ambulatory Visit: Payer: Self-pay

## 2018-02-17 DIAGNOSIS — M545 Low back pain: Secondary | ICD-10-CM

## 2018-02-17 DIAGNOSIS — M5416 Radiculopathy, lumbar region: Secondary | ICD-10-CM

## 2018-03-07 LAB — COLOGUARD: Cologuard: POSITIVE

## 2018-03-11 ENCOUNTER — Encounter: Payer: Self-pay | Admitting: Family

## 2018-03-11 ENCOUNTER — Telehealth: Payer: Self-pay | Admitting: Family

## 2018-03-11 ENCOUNTER — Encounter: Payer: Self-pay | Admitting: Internal Medicine

## 2018-03-11 NOTE — Telephone Encounter (Signed)
Please let him know that his Cologuard was positive; this tells [place] there were DNA changes detected that would indicate possible pre-cancerous polyps; would recommend follow-up colonoscopy; I know he was hesitant about another colonoscopy but I think it is appropriate. Order placed for him.

## 2018-03-11 NOTE — Progress Notes (Signed)
Outside notes received. Information abstracted. Notes sent to scan.  

## 2018-03-11 NOTE — Telephone Encounter (Signed)
Please let him know his Cologuard came back "positive." This would indicate DNA changes that are concerning for pre-cancerous polyps and would recommend colonoscopy; I know he didn't want to consider colonoscopy but I do think it is appropriate. Order has been placed.

## 2018-03-11 NOTE — Telephone Encounter (Signed)
Spoke with patient and info given. I will send him number for Gertie Fey so he can call back to set up test.

## 2018-03-20 ENCOUNTER — Ambulatory Visit: Payer: Medicare Other | Admitting: Family

## 2018-03-20 ENCOUNTER — Encounter: Payer: Self-pay | Admitting: Family

## 2018-03-20 ENCOUNTER — Other Ambulatory Visit: Payer: Medicare Other

## 2018-03-20 VITALS — BP 118/72 | HR 68 | Temp 98.2°F | Ht 74.0 in | Wt 182.1 lb

## 2018-03-20 DIAGNOSIS — R300 Dysuria: Secondary | ICD-10-CM | POA: Diagnosis not present

## 2018-03-20 LAB — POC URINALSYSI DIPSTICK (AUTOMATED)
Bilirubin, UA: NEGATIVE
Blood, UA: NEGATIVE
Glucose, UA: NEGATIVE
Ketones, UA: NEGATIVE
NITRITE UA: NEGATIVE
PROTEIN UA: NEGATIVE
Spec Grav, UA: 1.015 (ref 1.010–1.025)
Urobilinogen, UA: 1 E.U./dL
pH, UA: 6 (ref 5.0–8.0)

## 2018-03-20 MED ORDER — DOXYCYCLINE HYCLATE 100 MG PO TABS: 100.0000 mg | ORAL_TABLET | Freq: Two times a day (BID) | ORAL | 0 refills | Status: DC

## 2018-03-20 NOTE — Progress Notes (Signed)
DELL BRINER is a 72 y.o. male with the following history as recorded in [employer]:  Patient Active Problem List   Diagnosis Date Noted  . Left lumbar radiculopathy 01/07/2018  . Chest pain 10/15/2016  . Rectal prolapse 10/15/2016  . Maxillary sinus polyp 10/15/2016  . RHA (rheumatoid arthritis) ([place]) 10/15/2016  . Diastolic dysfunction 62/94/7654  . Psoriasis 10/15/2016  . CONTUSION, [place] 03/10/2009  . DEGENERATIVE JOINT DISEASE 02/07/2009  . HEADACHE 02/07/2009  . ABDOMINAL PAIN, LEFT LOWER QUADRANT 02/07/2009  . GERD 10/02/2007  . HYPERTENSION, BENIGN ESSENTIAL 10/08/2006  . 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 ERECTILE DYSFUNCTION 09/10/2006  . 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 PLANTAR FASCIITIS, BILATERAL 09/10/2006  . SLEEP APNEA 09/10/2006  . MALAISE AND FATIGUE 09/10/2006    Current Outpatient Medications  Medication Sig Dispense Refill  . doxycycline (VIBRA-TABS) 100 MG tablet Take 1 tablet (100 mg total) by mouth 2 (two) times daily. 28 tablet 0  . NON FORMULARY 1 mL. Mushroom powder. One teaspoonful every morning.    Marland Kitchen omeprazole (PRILOSEC) 40 MG capsule Take 40 mg by mouth 2 (two) times daily at 8 am and 10 pm.     . SOOLANTRA 1 % CREA APPLY TO AFFECTED AREA ONCE DAILY  5   No current facility-administered medications for this visit.     Allergies: Patient has no known allergies.  Past Medical History:  Diagnosis Date  . Arthritis   . 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 Constipation   . Hemorrhoids   . Maxillary sinus polyp     Past Surgical History:  Procedure Laterality Date  . herniated disk L2-L3  03/08/2010    Family History  Problem Relation Age of Onset  . Aortic aneurysm Mother   . Heart attack Father   . Aortic aneurysm Sister   . CVA Sister     Social History   Tobacco Use  . Smoking status: Former Smoker    Packs/day: 0.25    Types: Cigarettes    Start date: 10/15/1965    Last attempt to quit: 10/16/1974    Years since  quitting: 43.4  . Smokeless tobacco: Never Used  Substance Use Topics  . Alcohol use: Yes    Comment: occasional wine    Subjective:  Patient started approximately 4 weeks ago with sudden onset of fever/ chills; noticed that urine was very concentrated/ had a strong smell; seemed to resolve on its on within a few days; however, symptoms re-started in the past 5 days again- noted strong smell again/ penile discharge- no concerns for STD exposure; no previous history of prostatitis or urethrititis;   Objective:  Vitals:   03/20/18 1311  BP: 118/72  Pulse: 68  Temp: 98.2 F (36.8 C)  TempSrc: Oral  SpO2: 97%  Weight: 182 lb 1.3 oz (82.6 kg)  Height: 6\' 2"  (1.88 m)    General: Well developed, well nourished, in no acute distress  Skin : Warm and dry.  Head: Normocephalic and atraumatic  Lungs: Respirations unlabored; clear to auscultation bilaterally without wheeze, rales, rhonchi  Neurologic: Alert and oriented; speech intact; face symmetrical; moves all extremities well; CNII-XII intact without focal deficit   Assessment:  1. Dysuria     Plan:  Suspect urethritis/ prostatitis; check U/A and urine culture; Rx for Doxcycline 100 mg bid x 2 weeks; increase water intake; follow-up worse, no better.   No follow-ups on file.  Orders Placed This Encounter  Procedures  . Urine Culture  Standing Status:   Future    Standing Expiration Date:   03/20/2019  . POCT Urinalysis Dipstick (Automated)    Requested Prescriptions   Signed Prescriptions Disp Refills  . doxycycline (VIBRA-TABS) 100 MG tablet 28 tablet 0    Sig: Take 1 tablet (100 mg total) by mouth 2 (two) times daily.

## 2018-03-20 NOTE — Patient Instructions (Signed)
Prostatitis Prostatitis is swelling of the prostate gland. The prostate helps to make semen. It is below a man's bladder, in front of the rectum. There are different types of prostatitis. Follow these instructions at home:  Take over-the-counter and prescription medicines only as told by your doctor.  If you were prescribed an antibiotic medicine, take it as told by your doctor. Do not stop taking the antibiotic even if you start to feel better.  If your doctor prescribed exercises, do them as directed.  Take sitz baths as told by your doctor. To take a sitz bath, sit in warm water that is deep enough to cover your hips and butt.  Keep all follow-up visits as told by your doctor. This is important. Contact a doctor if:  Your symptoms get worse.  You have a fever. Get help right away if:  You have chills.  You feel sick to your stomach (nauseous).  You throw up (vomit).  You feel light-headed.  You feel like you might pass out (faint).  You cannot pee (urinate).  You have blood or clumps of blood (blood clots) in your pee (urine). This information is not intended to replace advice given to you by your health care provider. Make sure you discuss any questions you have with your health care provider. Document Released: 01/01/2012 Document Revised: 03/22/2016 Document Reviewed: 03/22/2016 Elsevier Interactive Patient Education  2017 [employer]. Urethritis, Adult Urethritis is an inflammation of the tube through which urine exits your bladder (urethra). What are the causes? Urethritis is often caused by an infection in your urethra. The infection can be viral, like herpes. The infection can also be bacterial, like gonorrhea. What increases the risk? Risk factors of urethritis include:  Having sex without using a condom.  Having multiple sexual partners.  Having poor hygiene.  What are the signs or symptoms? Symptoms of urethritis are less noticeable in women than in  men. These symptoms include:  Burning feeling when you urinate (dysuria).  Discharge from your urethra.  Blood in your urine (hematuria).  Urinating more than usual.  How is this diagnosed? To confirm a diagnosis of urethritis, your health care provider will do the following:  Ask about your sexual history.  Perform a physical exam.  Have you provide a sample of your urine for lab testing.  Use a cotton swab to gently collect a sample from your urethra for lab testing.  How is this treated? It is important to treat urethritis. Depending on the cause, untreated urethritis may lead to serious genital infections and possibly infertility. Urethritis caused by a bacterial infection is treated with antibiotic medicine. All sexual partners must be treated. Follow these instructions at home:  Do not have sex until the test results are known and treatment is completed, even if your symptoms go away before you finish treatment.  If you were prescribed an antibiotic, finish it all even if you start to feel better. Contact a health care provider if:  Your symptoms are not improved in 3 days.  Your symptoms are getting worse.  You develop abdominal pain or pelvic pain (in women).  You develop joint pain.  You have a fever. Get help right away if:  You have severe pain in the belly, back, or side.  You have repeated vomiting. This information is not intended to replace advice given to you by your health care provider. Make sure you discuss any questions you have with your health care provider. Document Released: 12/26/2000 Document Revised:  12/08/2015 Document Reviewed: 03/02/2013 Elsevier Interactive Patient Education  2017 [employer].

## 2018-03-21 ENCOUNTER — Encounter: Payer: Self-pay | Admitting: Family

## 2018-03-22 LAB — URINE CULTURE
MICRO NUMBER:: 91062296
SPECIMEN QUALITY: ADEQUATE

## 2018-03-24 ENCOUNTER — Other Ambulatory Visit: Payer: Self-pay | Admitting: Family

## 2018-03-24 ENCOUNTER — Encounter: Payer: Self-pay | Admitting: Internal Medicine

## 2018-03-24 DIAGNOSIS — N390 Urinary tract infection, site not specified: Secondary | ICD-10-CM

## 2018-03-24 DIAGNOSIS — K219 Gastro-esophageal reflux disease without esophagitis: Secondary | ICD-10-CM

## 2018-03-24 MED ORDER — CIPROFLOXACIN HCL 500 MG PO TABS: 500.0000 mg | ORAL_TABLET | Freq: Two times a day (BID) | ORAL | 0 refills | Status: DC

## 2018-04-01 NOTE — Progress Notes (Signed)
[facility] [address], [place] [address] Phone: [phone] Subjective:   Fontaine No, am serving as a scribe for Dr. Hulan Saas.     CC: Back pain  GEX:[id number]  Russell [name] is a 72 y.o. male coming in with complaint of back pain. He has been doing physical therapy which is helping his back pain. He continues to have pain in the 2nd and 3rd toes which his therapist said is coming from his back. Pain with lying supine. Pain decreases with lying on right side. Occasionally does have discoloration of 2nd and 3rd toes. Has stopped gabapentin as it was not helping him. Pain seems to be better with activity but worse when he is seated.  Overall feels that the back pain is significantly better by 90% but unfortunately the leg pain is not.  Still has a weakness and continues to have the discoloration that is the most concerning aspect.    Past Medical History:  Diagnosis Date  . Arthritis   . 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 Constipation   . Hemorrhoids   . Maxillary sinus polyp    Past Surgical History:  Procedure Laterality Date  . herniated disk L2-L3  03/08/2010   Social History   Socioeconomic History  . Marital status: Unknown    Spouse name: Not on file  . Number of children: Not on file  . Years of education: Not on file  . Highest education level: Not on file  Occupational History  . Not on file  Social Needs  . Financial resource strain: Not on file  . Food insecurity:    Worry: Not on file    Inability: Not on file  . Transportation needs:    Medical: Not on file    Non-medical: Not on file  Tobacco Use  . Smoking status: Former Smoker    Packs/day: 0.25    Types: Cigarettes    Start date: 10/15/1965    Last attempt to quit: 10/16/1974    Years since quitting: 43.4  . Smokeless tobacco: Never Used  Substance and Sexual Activity  . Alcohol use: Yes    Comment: occasional wine  . Drug use: No  . Sexual activity:  Not on file  Lifestyle  . Physical activity:    Days per week: Not on file    Minutes per session: Not on file  . Stress: Not on file  Relationships  . Social connections:    Talks on phone: Not on file    Gets together: Not on file    Attends religious service: Not on file    Active member of club or organization: Not on file    Attends meetings of clubs or organizations: Not on file    Relationship status: Not on file  Other Topics Concern  . Not on file  Social History Narrative  . Not on file   No Known Allergies Family History  Problem Relation Age of Onset  . Aortic aneurysm Mother   . Heart attack Father   . Aortic aneurysm Sister   . CVA Sister         Current Outpatient Medications (Hematological):  .  cilostazol (PLETAL) 50 MG tablet, Take 1 tablet (50 mg total) by mouth 2 (two) times daily.  Current Outpatient Medications (Other):  .  ciprofloxacin (CIPRO) 500 MG tablet, Take 1 tablet (500 mg total) by mouth 2 (two) times daily. .  NON FORMULARY, 1 mL.  Mushroom powder. One teaspoonful every morning. Marland Kitchen  omeprazole (PRILOSEC) 40 MG capsule, Take 40 mg by mouth 2 (two) times daily at 8 am and 10 pm.  .  SOOLANTRA 1 % CREA, APPLY TO AFFECTED AREA ONCE DAILY    Past medical history, social, surgical and family history all reviewed in electronic medical record.  No pertanent information unless stated regarding to the chief complaint.   Review of Systems:  No headache, visual changes, nausea, vomiting, diarrhea, constipation, dizziness, abdominal pain, skin rash, fevers, chills, night sweats, weight loss, swollen lymph nodes,chest pain, shortness of breath, mood changes.  Positive body aches and muscle aches  Objective  Blood pressure 110/74, pulse 66, height 6\' 2"  (1.88 m), weight 182 lb (82.6 kg), SpO2 94 %.    General: No apparent distress alert and oriented x3 mood and affect normal, dressed appropriately.  HEENT: Pupils equal, extraocular movements intact   Respiratory: Patient's speak in full sentences and does not appear short of breath  Cardiovascular: No lower extremity edema, non tender, no erythema  Skin: Warm dry intact with no signs of infection or rash on extremities or on axial skeleton.  Abdomen: Soft nontender  Neuro: Cranial nerves II through XII are intact, neurovascularly intact in all extremities with Lymph: No lymphadenopathy of posterior or anterior cervical chain or axillae bilaterally.  Gait mild antalgic MSK:  Non tender with full range of motion and good stability and symmetric strength and tone of shoulders, elbows, wrist, hip, knee and bilaterally.  Mild arthritic changes Patient does have some mild foot drop noted though on the left discoloration of the lower extremity of the left leg still noted.  Mottled appearance.  1+ dorsalis pedis and posterior tibialis pulses compared to the contralateral side.  Patient's foot is cool to touch with worsening darkening of the second through fourth toes from previous exam.   Impression and Recommendations:     This case required medical decision making of moderate complexity. The above documentation has been reviewed and is accurate and complete Lyndal Pulley, DO       Note: This dictation was prepared with Dragon dictation along with smaller phrase technology. Any transcriptional errors that result from this process are unintentional.

## 2018-04-02 ENCOUNTER — Ambulatory Visit (HOSPITAL_COMMUNITY)
Admission: RE | Admit: 2018-04-02 | Discharge: 2018-04-02 | Disposition: A | Discharge status: Home / Self Care | Payer: Medicare Other | Source: Ambulatory Visit | Attending: Vascular Surgery | Admitting: Vascular Surgery

## 2018-04-02 ENCOUNTER — Telehealth: Payer: Self-pay

## 2018-04-02 ENCOUNTER — Other Ambulatory Visit: Payer: Self-pay

## 2018-04-02 ENCOUNTER — Encounter: Payer: Self-pay | Admitting: Family Medicine

## 2018-04-02 ENCOUNTER — Ambulatory Visit: Payer: Medicare Other | Admitting: Family Medicine

## 2018-04-02 VITALS — BP 110/74 | HR 66 | Ht 74.0 in | Wt 182.0 lb

## 2018-04-02 DIAGNOSIS — I739 Peripheral vascular disease, unspecified: Secondary | ICD-10-CM | POA: Diagnosis not present

## 2018-04-02 DIAGNOSIS — M79605 Pain in left leg: Secondary | ICD-10-CM

## 2018-04-02 DIAGNOSIS — M5416 Radiculopathy, lumbar region: Secondary | ICD-10-CM | POA: Diagnosis not present

## 2018-04-02 DIAGNOSIS — R609 Edema, unspecified: Secondary | ICD-10-CM | POA: Diagnosis present

## 2018-04-02 DIAGNOSIS — M79606 Pain in leg, unspecified: Secondary | ICD-10-CM

## 2018-04-02 DIAGNOSIS — M79604 Pain in right leg: Secondary | ICD-10-CM | POA: Diagnosis not present

## 2018-04-02 DIAGNOSIS — I8393 Asymptomatic varicose veins of bilateral lower extremities: Secondary | ICD-10-CM | POA: Diagnosis not present

## 2018-04-02 MED ORDER — CILOSTAZOL 50 MG PO TABS: 50.0000 mg | ORAL_TABLET | Freq: Two times a day (BID) | ORAL | 1 refills | Status: DC

## 2018-04-02 NOTE — Patient Instructions (Signed)
Good to see you  We will get a couple tests for the blood flow in your legs  Pletal 50 mg twice daily but be careful with dizziness for a couple days Continue everything else  Make an appointment in 6 weeks just in case but write me in a week or so and tell me how the medicine is treating you

## 2018-04-02 NOTE — Assessment & Plan Note (Signed)
Patient's back pain actually seems improved.  Still can have some radicular symptoms from patient's previous back surgery that could be contributing.  I am though very concerned of the possibility for vascular compromise and ABI and venous duplex ordered today.

## 2018-04-02 NOTE — Telephone Encounter (Signed)
Faxed ROI to [facility] for records.

## 2018-04-02 NOTE — Assessment & Plan Note (Signed)
Concern with significant peripheral vascular disease at this time.  Lumbar radiculopathy was potentially playing a role.  Patient is still having signs of claudication and seems to be more vascular than neurologic.  Patient given Pletal but we will get a venous duplex as well as patient having an ABI for further evaluation.  Patient will likely be sent to vascular with further work-up depending on the findings.

## 2018-04-03 ENCOUNTER — Ambulatory Visit (HOSPITAL_COMMUNITY)
Admission: RE | Admit: 2018-04-03 | Discharge: 2018-04-03 | Disposition: A | Discharge status: Home / Self Care | Payer: Medicare Other | Source: Ambulatory Visit | Attending: Family Medicine | Admitting: Family Medicine

## 2018-04-03 DIAGNOSIS — M79605 Pain in left leg: Secondary | ICD-10-CM | POA: Diagnosis not present

## 2018-04-03 DIAGNOSIS — I739 Peripheral vascular disease, unspecified: Secondary | ICD-10-CM | POA: Diagnosis present

## 2018-04-03 DIAGNOSIS — M79604 Pain in right leg: Secondary | ICD-10-CM

## 2018-04-03 DIAGNOSIS — Z87891 Personal history of nicotine dependence: Secondary | ICD-10-CM | POA: Diagnosis not present

## 2018-04-03 NOTE — Addendum Note (Signed)
Addended by: Douglass Rivers T on: 04/03/2018 01:55 PM   Modules accepted: Orders

## 2018-04-04 ENCOUNTER — Encounter: Payer: Self-pay | Admitting: Family Medicine

## 2018-04-10 ENCOUNTER — Other Ambulatory Visit: Payer: Medicare Other

## 2018-04-10 DIAGNOSIS — N390 Urinary tract infection, site not specified: Secondary | ICD-10-CM

## 2018-04-11 LAB — URINE CULTURE
MICRO NUMBER: 91158563
Result:: NO GROWTH
SPECIMEN QUALITY:: ADEQUATE

## 2018-04-14 ENCOUNTER — Encounter: Payer: Self-pay | Admitting: Vascular Surgery

## 2018-04-14 ENCOUNTER — Ambulatory Visit (INDEPENDENT_AMBULATORY_CARE_PROVIDER_SITE_OTHER): Payer: Medicare Other | Admitting: Vascular Surgery

## 2018-04-14 VITALS — BP 119/81 | HR 80 | Temp 98.0°F | Resp 16 | Ht 74.0 in | Wt 185.0 lb

## 2018-04-14 DIAGNOSIS — M79606 Pain in leg, unspecified: Secondary | ICD-10-CM

## 2018-04-14 NOTE — Progress Notes (Signed)
[facility] [place]  Patient name: Russell [name] MRN: [id number] DOB: 05-09-1946 Sex: male  REASON FOR CONSULT: Valuation of pain in left foot  Seen today in our [place] office  HPI: Russell [name] is a 72 y.o. male, who is here today for evaluation of symptoms in his left foot and leg.  He has a complex past history.  He apparently had disc rupture of L to 3 and 2011.  He reports that there was a delay in diagnosis and he was completely incapacitated and had to learn to walk all over again.  He reports that he was left with a "nerve damage" in his left leg.  Has had ongoing issues regarding this since his 2011 event and has had marked improvement following disc surgery at that time with Dr. Christella Noa he now reports that for the last 1 to 2 years he has had progressive changes with low back pain.  Over the same period of time he has had some discoloration in the left second and third toes.  He also reports tenderness over the dorsum of his left foot.  He has balance issues as well.  He has had treatments with chiropractor D and also acupuncture with some leaf.  He also is learned on stretching and positioning can improve the symptoms as well.  He has no tissue loss.  He has no claudication type symptoms.  He does report he has a family history of aneurysmal disease in his family.  He has had imaging in the past to prove that he does not have an aortic aneurysm.  Past Medical History:  Diagnosis Date  . Arthritis   . 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 Constipation   . Hemorrhoids   . Maxillary sinus polyp     Family History  Problem Relation Age of Onset  . Aortic aneurysm Mother   . Heart attack Father   . Aortic aneurysm Sister   . CVA Sister     SOCIAL HISTORY: Social History   Socioeconomic History  . Marital status: Unknown    Spouse name: Not on file  . Number of children: Not on file  . Years of education: Not on  file  . Highest education level: Not on file  Occupational History  . Not on file  Social Needs  . Financial resource strain: Not on file  . Food insecurity:    Worry: Not on file    Inability: Not on file  . Transportation needs:    Medical: Not on file    Non-medical: Not on file  Tobacco Use  . Smoking status: Former Smoker    Packs/day: 0.25    Types: Cigarettes    Start date: 10/15/1965    Last attempt to quit: 10/16/1974    Years since quitting: 43.5  . Smokeless tobacco: Never Used  Substance and Sexual Activity  . Alcohol use: Yes    Comment: occasional wine  . Drug use: No  . Sexual activity: Not on file  Lifestyle  . Physical activity:    Days per week: Not on file    Minutes per session: Not on file  . Stress: Not on file  Relationships  . Social connections:    Talks on phone: Not on file    Gets together: Not on file    Attends religious service: Not on file    Active member of club or organization: Not on file    Attends meetings of  clubs or organizations: Not on file    Relationship status: Not on file  . Intimate partner violence:    Fear of current or ex partner: Not on file    Emotionally abused: Not on file    Physically abused: Not on file    Forced sexual activity: Not on file  Other Topics Concern  . Not on file  Social History Narrative  . Not on file    No Known Allergies  Current Outpatient Medications  Medication Sig Dispense Refill  . ciprofloxacin (CIPRO) 500 MG tablet Take 1 tablet (500 mg total) by mouth 2 (two) times daily. 20 tablet 0  . omeprazole (PRILOSEC) 40 MG capsule Take 40 mg by mouth 2 (two) times daily at 8 am and 10 pm.     . SOOLANTRA 1 % CREA APPLY TO AFFECTED AREA ONCE DAILY  5  . cilostazol (PLETAL) 50 MG tablet Take 1 tablet (50 mg total) by mouth 2 (two) times daily. (Patient not taking: Reported on 04/14/2018) 30 tablet 1  . NON FORMULARY 1 mL. Mushroom powder. One teaspoonful every morning.     No current  facility-administered medications for this visit.     REVIEW OF SYSTEMS:  [X]  denotes positive finding, [ ]  denotes negative finding Cardiac  Comments:  Chest pain or chest pressure:    Shortness of breath upon exertion:    Short of breath when lying flat:    Irregular heart rhythm: x       Vascular    Pain in calf, thigh, or hip brought on by ambulation:    Pain in feet at night that wakes you up from your sleep:     Blood clot in your veins:    Leg swelling:  x       Pulmonary    Oxygen at home:    Productive cough:     Wheezing:         Neurologic    Sudden weakness in arms or legs:     Sudden numbness in arms or legs:     Sudden onset of difficulty speaking or slurred speech:    Temporary loss of vision in one eye:     Problems with dizziness:         Gastrointestinal    Blood in stool:     Vomited blood:         Genitourinary    Burning when urinating:     Blood in urine:        Psychiatric    Major depression:         Hematologic    Bleeding problems:    Problems with blood clotting too easily:        Skin    Rashes or ulcers:        Constitutional    Fever or chills:      PHYSICAL EXAM: Vitals:   04/14/18 0930  BP: 119/81  Pulse: 80  Resp: 16  Temp: 98 F (36.7 C)  TempSrc: Temporal  Weight: 185 lb (83.9 kg)  Height: 6\' 2"  (1.88 m)    GENERAL: The patient is a well-nourished male, in no acute distress. The vital signs are documented above. CARDIOVASCULAR: Carotid arteries without bruits bilaterally.  Left radial 3+ femoral 3+ popliteal and 2-3+ posterior tibial pulses bilaterally.  Does have superficial varicose veins on both lower extremities with no hemosiderin deposits PULMONARY: There is good air exchange  ABDOMEN: Soft and non-tender  MUSCULOSKELETAL: There are  no major deformities or cyanosis. NEUROLOGIC: No focal weakness or paresthesias are detected. SKIN: There are no ulcers or rashes noted.  Mild ruborous changes on his left great  and second toe. PSYCHIATRIC: The patient has a normal affect.  DATA:  Invasive vascular lower extremity arterial and venous studies were reviewed.  From 04/03/2018.  He had a totally normal ankle arm index bilaterally and normal toe brachial index bilaterally.  He did have dampened waveforms in his great toe of questionable significance bilaterally.  Mild superficial and deep venous reflux bilaterally  MEDICAL ISSUES: Long discussion with the patient regarding these findings.  Explained that he does not have any arterial or venous pathology to explain his symptoms.  I suspect that this is related to neurogenic issues.  Explained that this is not limb threatening he would not recommend any further vascular work-up.  He was reassured with this discussion and will see [place] again on   Rosetta Posner, MD [facility] Tel [phone] Pager [phone]

## 2018-04-14 NOTE — Telephone Encounter (Signed)
Rec'd from [facility] forwarded 31 pages to [name] PT

## 2018-04-24 ENCOUNTER — Encounter: Payer: Self-pay | Admitting: Internal Medicine

## 2018-04-30 ENCOUNTER — Encounter: Payer: Self-pay | Admitting: Family Medicine

## 2018-05-09 ENCOUNTER — Other Ambulatory Visit (INDEPENDENT_AMBULATORY_CARE_PROVIDER_SITE_OTHER): Payer: Medicare Other

## 2018-05-09 ENCOUNTER — Ambulatory Visit: Payer: Medicare Other | Admitting: Internal Medicine

## 2018-05-09 ENCOUNTER — Encounter: Payer: Self-pay | Admitting: Internal Medicine

## 2018-05-09 VITALS — BP 112/62 | HR 85 | Ht 74.0 in | Wt 186.8 lb

## 2018-05-09 DIAGNOSIS — R195 Other fecal abnormalities: Secondary | ICD-10-CM | POA: Diagnosis not present

## 2018-05-09 DIAGNOSIS — K219 Gastro-esophageal reflux disease without esophagitis: Secondary | ICD-10-CM

## 2018-05-09 DIAGNOSIS — K5909 Other constipation: Secondary | ICD-10-CM

## 2018-05-09 DIAGNOSIS — R718 Other abnormality of red blood cells: Secondary | ICD-10-CM | POA: Diagnosis not present

## 2018-05-09 DIAGNOSIS — K649 Unspecified hemorrhoids: Secondary | ICD-10-CM

## 2018-05-09 LAB — CBC WITH DIFFERENTIAL/PLATELET
BASOS PCT: 0.5 % (ref 0.0–3.0)
Basophils Absolute: 0 10*3/uL (ref 0.0–0.1)
EOS PCT: 2.2 % (ref 0.0–5.0)
Eosinophils Absolute: 0.2 10*3/uL (ref 0.0–0.7)
HEMATOCRIT: 40.8 % (ref 39.0–52.0)
Hemoglobin: 13.2 g/dL (ref 13.0–17.0)
LYMPHS ABS: 1.9 10*3/uL (ref 0.7–4.0)
Lymphocytes Relative: 24 % (ref 12.0–46.0)
MCHC: 32.3 g/dL (ref 30.0–36.0)
MCV: 79.1 fl (ref 78.0–100.0)
MONOS PCT: 7 % (ref 3.0–12.0)
Monocytes Absolute: 0.6 10*3/uL (ref 0.1–1.0)
NEUTROS ABS: 5.3 10*3/uL (ref 1.4–7.7)
NEUTROS PCT: 66.3 % (ref 43.0–77.0)
PLATELETS: 220 10*3/uL (ref 150.0–400.0)
RBC: 5.16 Mil/uL (ref 4.22–5.81)
RDW: 14.8 % (ref 11.5–15.5)
WBC: 8 10*3/uL (ref 4.0–10.5)

## 2018-05-09 LAB — FERRITIN: FERRITIN: 167.4 ng/mL (ref 22.0–322.0)

## 2018-05-09 NOTE — Progress Notes (Signed)
Russell [name] 72 y.o. 1945-11-19 [id number]  Assessment & Plan:   Encounter Diagnoses  Name Primary?  . Gastroesophageal reflux disease, esophagitis presence not specified - cough, hoarse Yes  . Positive colorectal cancer screening using Cologuard test   . Chronic constipation   . Microcytosis   . Bleeding hemorrhoids    Evaluate these problems with an upper endoscopy and colonoscopy The risks and benefits as well as alternatives of endoscopic procedure(s) have been discussed and reviewed. All questions answered. The patient agrees to proceed.   Start daily MiraLAX.  Consider prunes or other dietary treatment for his constipation possible fiber supplement   Functional rectal exam to be done at colonoscopy.  I suspect his constipation is related to his spinal injury that he had area Subjective:   Chief Complaint: Positive Cologuard  HPI  Patient has a positive Cologuard.  He admits he was having rectal bleeding at that time he has a known history of hemorrhoids.  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  He had leg weakness then which improved but he has had some residual deficits he had bowel and bladder incontinence and then over time he is recovered with respect to the bladder but still has constipation problems were all have difficulty defecating and have small balls of stool and inadequate defecation.  Other problems are intermittent rectal bleeding from his known hemorrhoids as noted.  Also says he has a cough after eating frequently and clearing of throat and a raspy voice and he will get some heartburn.  Number of years ago he and his wife performed her diets and he lost a lot of weight and said that he did not need to take a daily PPI (was on twice daily) so he went to as needed over time.  So now if he has dietary indiscretion  with something that causes reflux which he says typically carbs and that sort of things will do he will take a Prilosec afterwards.  He sees a chiropractor regularly who had sent him to rheumatology because of his which seemed to improve with his weight loss, I do not think he has a clear rheumatologic diagnosis but does have osteoarthritis, and then he subsequently saw acupuncturist who suggested he have leaky bowel and that is when he and his wife went on the diet. Colonoscopy 2003 with mixed hemorrhoids No Known Allergies Current Meds  Medication Sig  . NON FORMULARY 1 mL. Mushroom powder. One teaspoonful every morning.  Marland Kitchen omeprazole (PRILOSEC) 40 MG capsule Take 40 mg by mouth as needed.   . SOOLANTRA 1 % CREA APPLY TO AFFECTED AREA ONCE DAILY   Past Medical History:  Diagnosis Date  . Arthritis   . 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[place]) 2011  . 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 Cervical spine degeneration   . Constipation   . Hemorrhoids   . Kidney stone    Remote  . Maxillary sinus polyp    Past Surgical History:  Procedure Laterality Date  . COLONOSCOPY    . 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   Review of Systems He has problems with balance and some leg weakness, back pain still periodic fatigue some urinary frequency at times pedal edema sleeping difficulty itching in the back and occasional headaches which she says her muscle tension headaches.  All other review of systems negative or as above.  Objective:   Physical Exam @BP  112/62   Pulse 85   Ht 6\' 2"  (1.88 m)   Wt 186 lb  12.8 oz (84.7 kg)   BMI 23.98 kg/m @  General:  Well-developed, well-nourished and in no acute distress Eyes:  anicteric. ENT:   Mouth and posterior pharynx free of lesions.  Neck:   supple w/o thyromegaly or mass.  Lungs: Clear to auscultation bilaterally. Heart:  S1S2, no rubs, murmurs, gallops. Abdomen:  soft, non-tender, no hepatosplenomegaly, hernia, or mass and BS+. Palpable sigmoid Rectal: deferred Lymph:  no cervical or supraclavicular adenopathy. Extremities:   no edema, cyanosis or clubbing Skin   no rash. Neuro:  A&O x 3.  Psych:  appropriate mood and  Affect.   Data Reviewed: See HPI.  I reviewed prior neurosurgery admission discharge summary from 2011 labs in the computer

## 2018-05-09 NOTE — Patient Instructions (Signed)
  You have been scheduled for an endoscopy and colonoscopy. Please follow the written instructions given to you at your visit today. Please pick up your prep supplies at the pharmacy within the next 1-3 days. If you use inhalers (even only as needed), please bring them with you on the day of your procedure.    Your provider has requested that you go to the basement level for lab work before leaving today. Press "B" on the elevator. The lab is located at the first door on the left as you exit the elevator.   Please take a dose of miralax daily.   I appreciate the opportunity to care for you. Silvano Rusk, MD, [facility]

## 2018-05-10 NOTE — Progress Notes (Signed)
CBC and iron are normal

## 2018-05-12 ENCOUNTER — Encounter: Payer: Self-pay | Admitting: Internal Medicine

## 2018-05-12 NOTE — Progress Notes (Signed)
Office Visit Note  Patient: Russell [name]             Date of Birth: 1946-01-15           MRN: [id number]             PCP: Marrian Salvage, FNP Referring: Marrian Salvage,* Visit Date: 05/21/2018 Occupation: Retired Pharmacist, hospital  Subjective:  Pain in bilateral hands.   History of Present Illness: Russell [name] is a 72 y.o. male seen in consultation per request of his PCP.  According to patient about 3 years ago he started having puffiness in his hands and discomfort.  At the time he was seeing his chiropractor for lower back pain.  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  He continues to have discomfort in his lower back.  He states he was referred to [facility] rheumatology where he had x-rays and labs and was diagnosed with rheumatoid arthritis.  He was on oral methotrexate and later on injectable methotrexate for almost 1 year without any improvement in his hand symptoms.  He was advised to go on a biologic therapy.  Patient decided not to go on the biologic therapy and stopped going for follow-up visits.  He states that he is also noticed pain and discomfort in his feet for many years.  He had flat feet and had problems with his ankles over time.  He also had numbness in his left second and third toe which he relates to his back.  He states he was started seeing an acupuncturist who told him that he has leaky gut syndrome.  He has been on low-carb, low dairy and gluten-free diet for almost 6 months.  He has noticed great improvement in the inflammation in his hands.  He states he does not have much discomfort but he continues to have limited range of motion in his hands and some swelling.   Activities of Daily Living:  Patient reports morning stiffness for 0 minute.   Patient Denies nocturnal pain.  Difficulty dressing/grooming: Denies Difficulty climbing stairs: Reports Difficulty  getting out of chair: Denies Difficulty using hands for taps, buttons, cutlery, and/or writing: Reports  Review of Systems  Constitutional: Positive for fatigue. Negative for night sweats.  HENT: Negative for mouth sores, mouth dryness and nose dryness.   Eyes: Negative for redness and dryness.  Respiratory: Negative for shortness of breath and difficulty breathing.        Sleep apnea  Cardiovascular: Negative for chest pain, palpitations, hypertension, irregular heartbeat and swelling in legs/feet.  Gastrointestinal: Positive for constipation and heartburn. Negative for diarrhea.  Endocrine: Negative for increased urination.  Musculoskeletal: Positive for arthralgias and joint pain. Negative for joint swelling, myalgias, muscle weakness, morning stiffness, muscle tenderness and myalgias.  Skin: Positive for color change and rash. Negative for hair loss, nodules/bumps, skin tightness, ulcers and sensitivity to sunlight.       H/o rosacea  Allergic/Immunologic: Negative for susceptible to infections.  Neurological: Negative for dizziness, fainting, memory loss, night sweats and weakness ( ).  Hematological: Negative for swollen glands.  Psychiatric/Behavioral: Negative for depressed mood and sleep disturbance. The patient is not nervous/anxious.     PMFS History:  Patient Active Problem List   Diagnosis Date Noted  . Left lumbar radiculopathy 01/07/2018  . Rectal prolapse 10/15/2016  . Diastolic dysfunction 99/83/3825  . Psoriasis 10/15/2016  . DEGENERATIVE JOINT DISEASE 02/07/2009  .  HEADACHE 02/07/2009  . GERD 10/02/2007  . HYPERTENSION, BENIGN ESSENTIAL 10/08/2006  . 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 ERECTILE DYSFUNCTION 09/10/2006  . Bleeding internal hemorrhoids 09/10/2006  . PLANTAR FASCIITIS, BILATERAL 09/10/2006  . SLEEP APNEA 09/10/2006  . MALAISE AND FATIGUE 09/10/2006    Past Medical History:  Diagnosis Date  . Anemia 2019  . Arthritis   . Bleeding internal  hemorrhoids 09/10/2006   Qualifier: Diagnosis of  By: Jonna Munro MD, Roderic Scarce    . 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[place]) 2011  . Cerebellar infarct ([place])   . Cervical spine degeneration   . Constipation   . Hemorrhoids   . Kidney stone    Remote  . Maxillary sinus polyp   . Sleep apnea 10 years ago   declined CPAP  . Stroke ([place]) 15-20 years ago    Family History  Problem Relation Age of Onset  . Aortic aneurysm Mother   . Heart attack Father   . Aortic aneurysm Sister   . CVA Sister   . Colon cancer Neg Hx   . Pancreatic cancer Neg Hx   . Rectal cancer Neg Hx   . Stomach cancer Neg Hx    Past Surgical History:  Procedure Laterality Date  . COLONOSCOPY    . LUMBAR LAMINECTOMY  03/08/2010   L2-3, cauda equina syndrome  . 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 He appears well-developed and well-nourished.  HENT:  Head: Normocephalic and atraumatic.  Eyes: Pupils are equal, round, and reactive to light. Conjunctivae and EOM are normal.  Neck: Normal range of motion. Neck supple.  Cardiovascular: Normal rate, regular rhythm and normal heart sounds.  Pulmonary/Chest: Effort normal and breath sounds normal.  Abdominal: Soft. Bowel sounds are normal.  Neurological: He is alert and oriented to person, place, and time.  Skin: Skin is warm and dry. Capillary refill takes less than 2 seconds.  Psychiatric: He has a normal mood and affect. His behavior is normal.    Nursing note and vitals reviewed.    Musculoskeletal Exam: C-spine good range of motion.  Thoracic spine good range of motion.  He has limited painful range of motion of his lumbar spine.  No SI joint tenderness was noted.  Shoulder joints elbow joints were in good range of motion.  Wrist joint MCPs were in good range of motion with no synovitis.  He had DIP and PIP thickening with swelling over some of his PIP joints.  Hip joints were in good range of motion.  Knee joints were in good range of motion.  He has some warmth on palpation of his left knee.  He has bilateral ankle joint subluxation.  He has some DIP and PIP thickening in his feet without any synovitis.  CDAI Exam: CDAI Score: Not documented Patient Global Assessment: Not  documented; Provider Global Assessment: Not documented Swollen: Not documented; Tender: Not documented Joint Exam   Not documented   There is currently no information documented on the homunculus. Go to the Rheumatology activity and complete the homunculus joint exam.  Investigation: No additional findings.  Imaging: Xr Foot 2 Views Left  Result Date: 05/21/2018 PIP and DIP narrowing was noted.  First MTP narrowing was noted.  No erosive changes were noted.  No intertarsal joint space narrowing was noted.  Dorsal spurring was noted.  Tibiotalar joint space narrowing was noted. Impression: These findings are consistent with osteoarthritis of the foot.  He also has subluxation of the ankle joint due to pes planus.  Xr Foot 2 Views Right  Result Date: 05/21/2018 PIP and DIP narrowing was noted.  First MTP narrowing was noted.  No erosive changes were noted.  No intertarsal joint space narrowing was noted.  Dorsal spurring was noted.  Tibiotalar joint space narrowing was noted. Impression: These findings are consistent with osteoarthritis of the foot.  He also has subluxation of the ankle joint due to pes planus.  Xr Hand 2 View Left  Result Date:  05/21/2018 Severe CMC, PIP and DIP joint space narrowing was noted.  Ankylosis of fifth PIP joint was noted.  Second and third MCP joint narrowing was noted.  No erosive changes were noted. Impression: These findings are consistent with severe inflammatory osteoarthritis or psoriatic arthritis.  Xr Hand 2 View Right  Result Date: 05/21/2018 Severe PIP and DIP joint space narrowing was noted.  Second and third MCP joint narrowing was noted.  No intercarpal radiocarpal joint space narrowing was noted. Impression: These findings could be consistent with inflammatory osteoarthritis or psoriatic arthritis.  Xr Knee 3 View Left  Result Date: 05/21/2018 Moderate medial compartment narrowing was noted.  Mild to moderate patellofemoral narrowing was noted.  No chondrocalcinosis was noted. 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   Recent Labs: Lab Results  Component Value Date   WBC 8.0 05/09/2018   HGB 13.2 05/09/2018   PLT 220.0 05/09/2018   NA 140 11/01/2016   K 3.9 11/01/2016   CL 107 11/01/2016   CO2 26 11/01/2016   GLUCOSE 98 11/01/2016   BUN 14 11/01/2016   CREATININE 0.83 11/01/2016   BILITOT 1.1 02/10/2009   ALKPHOS 41 02/10/2009   AST 19 02/10/2009   ALT 15 02/10/2009   PROT 6.8 02/10/2009   ALBUMIN 4.2 02/10/2009   CALCIUM 9.1 11/01/2016   GFRAA >60 11/01/2016    Speciality Comments: No specialty comments available.  Procedures:  No procedures performed Allergies: Patient has no known allergies.   Assessment / Plan:     Visit Diagnoses: Pain in both hands -patient has pain and discomfort in his bilateral hands with some inflammatory changes and PIP joints.  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  Psoriatic arthritis should be kept in the differential.  Rheumatoid factor, Sedimentation rate, Cyclic  citrul peptide antibody, IgG, Uric acid, HLA-B27 antigen, 14-3-3 eta Protein  Chronic pain of left knee -he gives an history of injury to his left knee joint.  He has some warmth on palpation of his left knee.  Plan: XR KNEE 3 VIEW LEFT.  The x-ray showed moderate osteoarthritis and chondromalacia patella.  Pain in both feet -he had some arthritic changes in his feet but no joint inflammation.  His  subluxation of bilateral ankles.  Plan: XR Foot 2 Views Right, XR Foot 2 Views Left.  The x-rays were consistent with osteoarthritis of bilateral feet and bilateral tibiotalar joint collapse.  Psoriasis-psoriasis as mentioned in his chart but patient denies any history of psoriasis.  I did not see any psoriasis patches on examination.  I have advised him to confirm this diagnosis from his dermatologist.  He states he sees dermatologist for rosacea.  DDD cervical-chronic pain  DDD lumbar-chronic pain  Plantar fasciitis-he has had problems with plantar fasciitis in the past currently does not have any plantar fasciitis.  Essential hypertension  Left lumbar radiculopathy-he has had lumbar spine surgery with residual neuropathy.  Diastolic dysfunction  History of kidney stones  Cerebellar infarct ([facility])  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[place]) - Listed under history    Orders: Orders Placed This Encounter  Procedures  . XR Hand 2 View Right  . XR Hand 2 View Left  . XR KNEE 3 VIEW LEFT  . XR Foot 2 Views Right  . XR Foot 2 Views Left  . Rheumatoid factor  . Sedimentation rate  . Cyclic citrul peptide antibody, IgG  . Uric acid  . HLA-B27 antigen  . 14-3-3 eta Protein   Meds ordered this encounter  Medications  . diclofenac sodium (VOLTAREN) 1 % GEL    Sig: 3 grams to 3 large joints up to 3 times daily    Dispense:  3 Tube    Refill:  3    Face-to-face time spent with patient was 50 minutes. Greater than 50% of time was spent in counseling and coordination of care.  Follow-Up Instructions:  Return for Inflammatory arthritis,, Osteoarthritis.   Bo Merino, MD  Note - This record has been created using Editor, commissioning.  Chart creation errors have been sought, but may not always  have been located. Such creation errors do not reflect on  the standard of medical care.

## 2018-05-16 ENCOUNTER — Encounter: Payer: Self-pay | Admitting: Internal Medicine

## 2018-05-16 ENCOUNTER — Ambulatory Visit (AMBULATORY_SURGERY_CENTER): Payer: Medicare Other | Admitting: Internal Medicine

## 2018-05-16 VITALS — BP 110/65 | HR 47 | Temp 98.6°F | Resp 10 | Ht 74.0 in | Wt 186.0 lb

## 2018-05-16 DIAGNOSIS — K2970 Gastritis, unspecified, without bleeding: Secondary | ICD-10-CM | POA: Diagnosis not present

## 2018-05-16 DIAGNOSIS — K635 Polyp of colon: Secondary | ICD-10-CM

## 2018-05-16 DIAGNOSIS — K2990 Gastroduodenitis, unspecified, without bleeding: Secondary | ICD-10-CM

## 2018-05-16 DIAGNOSIS — D120 Benign neoplasm of cecum: Secondary | ICD-10-CM

## 2018-05-16 DIAGNOSIS — K3189 Other diseases of stomach and duodenum: Secondary | ICD-10-CM

## 2018-05-16 DIAGNOSIS — K317 Polyp of stomach and duodenum: Secondary | ICD-10-CM

## 2018-05-16 DIAGNOSIS — K219 Gastro-esophageal reflux disease without esophagitis: Secondary | ICD-10-CM

## 2018-05-16 DIAGNOSIS — R195 Other fecal abnormalities: Secondary | ICD-10-CM

## 2018-05-16 MED ORDER — SODIUM CHLORIDE 0.9 % IV SOLN: 500.0000 mL | Freq: Once | INTRAVENOUS | Status: DC

## 2018-05-16 NOTE — Op Note (Signed)
[facility] Patient Name: Russell [name] Procedure Date: 05/16/2018 12:16 PM MRN: [id number] Endoscopist: Gatha Mayer , MD Age: 72 Referring MD:  Date of Birth: 1945/10/20 Gender: Male Account #: [id number] Procedure:                Colonoscopy Indications:              Positive Cologuard test Medicines:                Propofol per Anesthesia, Monitored Anesthesia Care Procedure:                Pre-Anesthesia Assessment:                           - Prior to the procedure, a History and Physical                            was performed, and patient medications and                            allergies were reviewed. The patient's tolerance of                            previous anesthesia was also reviewed. The risks                            and benefits of the procedure and the sedation                            options and risks were discussed with the patient.                            All questions were answered, and informed consent                            was obtained. Prior Anticoagulants: The patient has                            taken no previous anticoagulant or antiplatelet                            agents. ASA Grade Assessment: III - A patient with                            severe systemic disease. After reviewing the risks                            and benefits, the patient was deemed in                            satisfactory condition to undergo the procedure.                           - Prior to the procedure, a History and Physical  was performed, and patient medications and                            allergies were reviewed. The patient's tolerance of                            previous anesthesia was also reviewed. The risks                            and benefits of the procedure and the sedation                            options and risks were discussed with the patient.                            All questions were  answered, and informed consent                            was obtained. Prior Anticoagulants: The patient has                            taken no previous anticoagulant or antiplatelet                            agents. ASA Grade Assessment: III - A patient with                            severe systemic disease. After reviewing the risks                            and benefits, the patient was deemed in                            satisfactory condition to undergo the procedure.                           After obtaining informed consent, the colonoscope                            was passed under direct vision. Throughout the                            procedure, the patient's blood pressure, pulse, and                            oxygen saturations were monitored continuously. The                            Colonoscope was introduced through the anus and                            advanced to the the cecum, identified by  appendiceal orifice and ileocecal valve. The                            patient tolerated the procedure well. The quality                            of the bowel preparation was good. The ileocecal                            valve, appendiceal orifice, and rectum were                            photographed. The colonoscopy was somewhat                            difficult due to a redundant colon and significant                            looping. Successful completion of the procedure was                            aided by applying abdominal pressure. The bowel                            preparation used was Miralax. Scope In: 12:34:38 PM Scope Out: 12:55:57 PM Scope Withdrawal Time: 0 hours 15 minutes 27 seconds  Total Procedure Duration: 0 hours 21 minutes 19 seconds  Findings:                 The perianal and digital rectal examinations were                            normal. Pertinent negatives include normal prostate                             (size, shape, and consistency).                           A diminutive polyp was found in the cecum. The                            polyp was sessile. The polyp was removed with a                            cold snare. Resection and retrieval were complete.                            Verification of patient identification for the                            specimen was done. Estimated blood loss was minimal.                           Internal hemorrhoids were found.  The exam was otherwise without abnormality on                            direct and retroflexion views. Complications:            No immediate complications. Estimated Blood Loss:     Estimated blood loss was minimal. Impression:               - One diminutive polyp in the cecum, removed with a                            cold snare. Resected and retrieved.                           - Internal hemorrhoids.                           - The examination was otherwise normal on direct                            and retroflexion views. Recommendation:           - Patient has a contact number available for                            emergencies. The signs and symptoms of potential                            delayed complications were discussed with the                            patient. Return to normal activities tomorrow.                            Written discharge instructions were provided to the                            patient.                           - Resume previous diet.                           - Continue present medications.                           - Repeat colonoscopy is recommended. The                            colonoscopy date will be determined after pathology                            results from today's exam become available for                            review.                           -  Stay on daily MiraLAx                           Consider hemorrhoid banding in  office Gatha Mayer, MD 05/16/2018 1:11:39 PM This report has been signed electronically.

## 2018-05-16 NOTE — Progress Notes (Signed)
Pt's states no medical or surgical changes since previsit or office visit.Patient consents to observer being present for procedure.  

## 2018-05-16 NOTE — Patient Instructions (Addendum)
   I saw inflammation from reflux and inflammation in stomach and intestine. I also saw stomach polyps. Biopsies were taken.  There was a small colon polyp removed and the hemorrhoids were seen. I can band the hemorrhoids if desired - done in office.  No signs of cancer.  I will let you know pathology results and plans - but take omeprazole every day not just as needed.  I appreciate the opportunity to care for you. Gatha Mayer, MD, [facility]   **Handouts given on polyps and hemorrhoids**  YOU HAD AN ENDOSCOPIC PROCEDURE TODAY: Refer to the procedure report and other information in the discharge instructions given to you for any specific questions about what was found during the examination. If this information does not answer your questions, please call [place] office at [phone] to clarify.   YOU SHOULD EXPECT: Some feelings of bloating in the abdomen. Passage of more gas than usual. Walking can help get rid of the air that was put into your GI tract during the procedure and reduce the bloating. If you had a lower endoscopy (such as a colonoscopy or flexible sigmoidoscopy) you may notice spotting of blood in your stool or on the toilet paper. Some abdominal soreness may be present for a day or two, also.  DIET: Your first meal following the procedure should be a light meal and then it is ok to progress to your normal diet. A half-sandwich or bowl of soup is an example of a good first meal. Heavy or fried foods are harder to digest and may make you feel nauseous or bloated. Drink plenty of fluids but you should avoid alcoholic beverages for 24 hours. If you had a esophageal dilation, please see attached instructions for diet.    ACTIVITY: Your care partner should take you home directly after the procedure. You should plan to take it easy, moving slowly for the rest of the day. You can resume normal activity the day after the procedure however YOU SHOULD NOT DRIVE, use power tools,  machinery or perform tasks that involve climbing or major physical exertion for 24 hours (because of the sedation medicines used during the test).   SYMPTOMS TO REPORT IMMEDIATELY: A gastroenterologist can be reached at any hour. Please call [phone]  for any of the following symptoms:  Following lower endoscopy (colonoscopy, flexible sigmoidoscopy) Excessive amounts of blood in the stool  Significant tenderness, worsening of abdominal pains  Swelling of the abdomen that is new, acute  Fever of 100 or higher  Following upper endoscopy (EGD, EUS, ERCP, esophageal dilation) Vomiting of blood or coffee ground material  New, significant abdominal pain  New, significant chest pain or pain under the shoulder blades  Painful or persistently difficult swallowing  New shortness of breath  Black, tarry-looking or red, bloody stools  FOLLOW UP:  If any biopsies were taken you will be contacted by phone or by letter within the next 1-3 weeks. Call [phone]  if you have not heard about the biopsies in 3 weeks.  Please also call with any specific questions about appointments or follow up tests.

## 2018-05-16 NOTE — Progress Notes (Signed)
Called to room to assist during endoscopic procedure.  Patient ID and intended procedure confirmed with present staff. Received instructions for my participation in the procedure from the performing physician.  

## 2018-05-16 NOTE — Op Note (Signed)
Russell [name]: Steward Sames Procedure Date: 05/16/2018 12:15 PM MRN: [id number] Endoscopist: Gatha Mayer , MD Age: 72 Referring MD:  Date of Birth: 1946/04/01 Gender: Male Account #: [id number] Procedure:                Upper GI endoscopy Indications:              Suspected esophageal reflux Medicines:                Propofol per Anesthesia, Monitored Anesthesia Care Procedure:                Pre-Anesthesia Assessment:                           - Prior to the procedure, a History and Physical                            was performed, and patient medications and                            allergies were reviewed. The patient's tolerance of                            previous anesthesia was also reviewed. The risks                            and benefits of the procedure and the sedation                            options and risks were discussed with the patient.                            All questions were answered, and informed consent                            was obtained. Prior Anticoagulants: The patient has                            taken no previous anticoagulant or antiplatelet                            agents. ASA Grade Assessment: III - A patient with                            severe systemic disease. After reviewing the risks                            and benefits, the patient was deemed in                            satisfactory condition to undergo the procedure.                           After obtaining informed consent, the endoscope was  passed under direct vision. Throughout the                            procedure, the patient's blood pressure, pulse, and                            oxygen saturations were monitored continuously. The                            Endoscope was introduced through the mouth, and                            advanced to the second part of duodenum. The upper   GI endoscopy was accomplished without difficulty.                            The patient tolerated the procedure well. Scope In: Scope Out: Findings:                 LA Grade A (one or more mucosal breaks less than 5                            mm, not extending between tops of 2 mucosal folds)                            esophagitis was found at the gastroesophageal                            junction.                           Multiple diminutive sessile polyps with no stigmata                            of recent bleeding were found in the gastric fundus                            and in the gastric body. Biopsies were taken with a                            cold forceps for histology. Verification of patient                            identification for the specimen was done. Estimated                            blood loss was minimal.                           Patchy mild inflammation characterized by erosions,                            erythema and granularity was found in the gastric  antrum. Biopsies were taken with a cold forceps for                            histology. Verification of patient identification                            for the specimen was done. Estimated blood loss was                            minimal.                           Patchy mild inflammation characterized by                            congestion (edema), erosions, erythema and                            granularity was found in the duodenal bulb and in                            the second portion of the duodenum.                           The exam was otherwise without abnormality.                           The cardia and gastric fundus were normal on                            retroflexion. Complications:            No immediate complications. Estimated Blood Loss:     Estimated blood loss was minimal. Impression:               - LA Grade A reflux esophagitis.                            - Multiple gastric polyps. Biopsied.                           - Gastritis. Biopsied.                           - Duodenitis.                           - The examination was otherwise normal. Recommendation:           - Patient has a contact number available for                            emergencies. The signs and symptoms of potential                            delayed complications were discussed with the  patient. Return to normal activities tomorrow.                            Written discharge instructions were provided to the                            patient.                           - Resume previous diet.                           - Continue present medications.                           - See the other procedure note for documentation of                            additional recommendations.                           - Take omeprazole every day Gatha Mayer, MD 05/16/2018 1:08:45 PM This report has been signed electronically.

## 2018-05-16 NOTE — Progress Notes (Signed)
A/ox3 pleased with MAC, report to RN 

## 2018-05-19 ENCOUNTER — Telehealth: Payer: Self-pay

## 2018-05-19 NOTE — Telephone Encounter (Signed)
First post procedure follow up call, no answer 

## 2018-05-19 NOTE — Telephone Encounter (Signed)
Pt return the call and would like to talk to the nurse.

## 2018-05-19 NOTE — Telephone Encounter (Signed)
  Follow up Call-  Call back number 05/16/2018  Post procedure Call Back phone  # [phone]  Permission to leave phone message Yes  Some recent data might be hidden     Patient questions:  Do you have a fever, pain , or abdominal swelling? Yes.   Pain Score  3 * Left lower abd "that Dr Carlean Purl noted in his report"  Have you tolerated food without any problems? Yes.    Have you been able to return to your normal activities? Yes.    Do you have any questions about your discharge instructions: Diet   No. Medications  No. Follow up visit  No.  Do you have questions or concerns about your Care? No.  Actions: * If pain score is 4 or above: No action needed, pain <4.

## 2018-05-21 ENCOUNTER — Ambulatory Visit (INDEPENDENT_AMBULATORY_CARE_PROVIDER_SITE_OTHER): Payer: Self-pay

## 2018-05-21 ENCOUNTER — Ambulatory Visit: Payer: Medicare Other | Admitting: Rheumatology

## 2018-05-21 ENCOUNTER — Encounter: Payer: Self-pay | Admitting: Rheumatology

## 2018-05-21 VITALS — BP 124/80 | HR 58 | Resp 14 | Ht 74.0 in | Wt 186.6 lb

## 2018-05-21 DIAGNOSIS — M79642 Pain in left hand: Secondary | ICD-10-CM | POA: Diagnosis not present

## 2018-05-21 DIAGNOSIS — L409 Psoriasis, unspecified: Secondary | ICD-10-CM | POA: Diagnosis not present

## 2018-05-21 DIAGNOSIS — G8929 Other chronic pain: Secondary | ICD-10-CM

## 2018-05-21 DIAGNOSIS — M5136 Other intervertebral disc degeneration, lumbar region: Secondary | ICD-10-CM

## 2018-05-21 DIAGNOSIS — M79671 Pain in right foot: Secondary | ICD-10-CM

## 2018-05-21 DIAGNOSIS — M5030 Other cervical disc degeneration, unspecified cervical region: Secondary | ICD-10-CM

## 2018-05-21 DIAGNOSIS — M722 Plantar fascial fibromatosis: Secondary | ICD-10-CM

## 2018-05-21 DIAGNOSIS — Z87442 Personal history of urinary calculi: Secondary | ICD-10-CM

## 2018-05-21 DIAGNOSIS — G834 Cauda equina syndrome: Secondary | ICD-10-CM

## 2018-05-21 DIAGNOSIS — M5416 Radiculopathy, lumbar region: Secondary | ICD-10-CM

## 2018-05-21 DIAGNOSIS — I639 Cerebral infarction, unspecified: Secondary | ICD-10-CM

## 2018-05-21 DIAGNOSIS — M79641 Pain in right hand: Secondary | ICD-10-CM

## 2018-05-21 DIAGNOSIS — I5189 Other ill-defined heart diseases: Secondary | ICD-10-CM

## 2018-05-21 DIAGNOSIS — M25562 Pain in left knee: Secondary | ICD-10-CM

## 2018-05-21 DIAGNOSIS — M79672 Pain in left foot: Secondary | ICD-10-CM

## 2018-05-21 DIAGNOSIS — I10 Essential (primary) hypertension: Secondary | ICD-10-CM

## 2018-05-21 MED ORDER — DICLOFENAC SODIUM 1 % TD GEL: TRANSDERMAL | 3 refills | Status: DC

## 2018-05-25 LAB — URIC ACID: URIC ACID, SERUM: 5.5 mg/dL (ref 4.0–8.0)

## 2018-05-25 LAB — 14-3-3 ETA PROTEIN

## 2018-05-25 LAB — RHEUMATOID FACTOR: Rhuematoid fact SerPl-aCnc: <14 IU/mL (ref <14)

## 2018-05-25 LAB — CYCLIC CITRUL PEPTIDE ANTIBODY, IGG: Cyclic Citrullin Peptide Ab: <16 UNITS

## 2018-05-25 LAB — SEDIMENTATION RATE: Sed Rate: 11 mm/h (ref 0–20)

## 2018-05-25 LAB — HLA-B27 ANTIGEN: HLA-B27 ANTIGEN: NEGATIVE

## 2018-05-29 ENCOUNTER — Encounter: Payer: Self-pay | Admitting: Internal Medicine

## 2018-05-29 DIAGNOSIS — Z860100 Personal history of colon polyps, unspecified: Secondary | ICD-10-CM | POA: Insufficient documentation

## 2018-05-29 HISTORY — DX: Personal history of colon polyps, unspecified: Z86.0100

## 2018-05-29 HISTORY — DX: Personal history of colonic polyps: Z86.010

## 2018-05-29 NOTE — Progress Notes (Signed)
1 ssp Reflux and gastritis no h p Recall colon 5 years My Chart

## 2018-05-30 ENCOUNTER — Telehealth: Payer: Self-pay | Admitting: Internal Medicine

## 2018-05-30 MED ORDER — HYDROCORTISONE 2.5 % RE CREA: 1.0000 "application " | TOPICAL_CREAM | Freq: Two times a day (BID) | RECTAL | 1 refills | Status: DC

## 2018-05-30 NOTE — Telephone Encounter (Signed)
Pt called regarding my chart message that he sent yesterday, he needs advise on his hemorrhoids that burst and has changed his bms. Pls call him

## 2018-05-30 NOTE — Telephone Encounter (Signed)
Patient reports that on Wed he had a large BM after "spending the day in discomfort feeling like I needed to go". On Thursday he had a large BM and had a BM with a large amount of rectal bleeding. This am he had a BM with a large amount of bleeding.  Blood is bright red.  He feels this is from his "hemorrhoid explosion".  He is able to manually "push hemorrhoid back up" and the bleeding stops.  He is asking how much bleeding is too much?  Only reports bleeding with a BM, not with passing gas.  Please advise

## 2018-05-30 NOTE — Telephone Encounter (Signed)
Wants to be called back to [phone].

## 2018-05-30 NOTE — Telephone Encounter (Signed)
I called him  Sounds like Grade 3 prolapsed internal hemorrhoid  He was reassured  He will reduce MiraLAx to 1/2 dose a day "workng too good"  He needs:  Banding appointment next week or a f/u slot - may need to squeeze in  I have Rxed Hydrocortisone cream

## 2018-05-30 NOTE — Telephone Encounter (Signed)
He will come in on 06/03/18 3:15

## 2018-06-03 ENCOUNTER — Encounter: Payer: Self-pay | Admitting: Internal Medicine

## 2018-06-03 ENCOUNTER — Ambulatory Visit: Payer: Medicare Other | Admitting: Internal Medicine

## 2018-06-03 DIAGNOSIS — K642 Third degree hemorrhoids: Secondary | ICD-10-CM

## 2018-06-03 NOTE — Patient Instructions (Signed)
HEMORRHOID BANDING PROCEDURE    FOLLOW-UP CARE   1. The procedure you have had should have been relatively painless since the banding of the area involved does not have nerve endings and there is no pain sensation.  The rubber band cuts off the blood supply to the hemorrhoid and the band may fall off as soon as 48 hours after the banding (the band may occasionally be seen in the toilet bowl following a bowel movement). You may notice a temporary feeling of fullness in the rectum which should respond adequately to plain Tylenol or Motrin.  2. Following the banding, avoid strenuous exercise that evening and resume full activity the next day.  A sitz bath (soaking in a warm tub) or bidet is soothing, and can be useful for cleansing the area after bowel movements.     3. To avoid constipation, take two tablespoons of natural wheat bran, natural oat bran, flax, Benefiber or any over the counter fiber supplement and increase your water intake to 7-8 glasses daily.    4. Unless you have been prescribed anorectal medication, do not put anything inside your rectum for two weeks: No suppositories, enemas, fingers, etc.  5. Occasionally, you may have more bleeding than usual after the banding procedure.  This is often from the untreated hemorrhoids rather than the treated one.  Don't be concerned if there is a tablespoon or so of blood.  If there is more blood than this, lie flat with your bottom higher than your head and apply an ice pack to the area. If the bleeding does not stop within a half an hour or if you feel faint, call our office at [phone] or go to the emergency room.  6. Problems are not common; however, if there is a substantial amount of bleeding, severe pain, chills, fever or difficulty passing urine (very rare) or other problems, you should call us at [phone] or report to the nearest emergency room.  7. Do not stay seated continuously for more than 2-3 hours for a day or two  after the procedure.  Tighten your buttock muscles 10-15 times every two hours and take 10-15 deep breaths every 1-2 hours.  Do not spend more than a few minutes on the toilet if you cannot empty your bowel; instead re-visit the toilet at a later time.    We will see you back on 06/18/18 for more banding.    I appreciate the opportunity to care for you. Silvano Rusk, MD, [facility]

## 2018-06-03 NOTE — Progress Notes (Signed)
  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  Colonoscopy November 1 with a diminutive adenoma and his hemorrhoids  He has had difficulty with constipation that is improved with MiraLAX but a full dose of MiraLAX was causing diarrhea which probably aggravated his hemorrhoids.  Now much better on a half dose daily.  Rectal exam shows slight bulge of the anoderm on the left lateral aspect.  He does not have overt prolapse of hemorrhoidal tissue with Valsalva.  Digital rectal exam is nontender without mass and there is formed brown stool present.  Anoscopy exam shows a grade 3 left lateral internal hemorrhoid that prolapses with removal of the anoscope.  He has grade 2 right anterior and right posterior internal hemorrhoids.  There is stigmata of bleeding with prominent vasovagal sore on all the hemorrhoids.  The grade 3 left lateral internal hemorrhoid is quite inflamed.  PROCEDURE NOTE: The patient presents with symptomatic grade 2 and 3 hemorrhoids, requesting rubber band ligation of his/her hemorrhoidal disease.  All risks, benefits and alternative forms of therapy were described and informed consent was obtained.   The anorectum was pre-medicated with 0.125% nitroglycerin and 5% lidocaine The decision was made to band the left lateral internal hemorrhoid, and the [place] was used to perform band ligation without complication.   2 bands were applied in this region given the size of this hemorrhoid Digital anorectal examination was then performed to assure proper positioning of the band, and to adjust the banded tissue as required.  The patient was discharged home without pain or other issues.  Dietary and behavioral recommendations were given and along with follow-up instructions.     The following adjunctive treatments were  recommended: Continue MiraLAX  The patient will return in early December for  follow-up and possible additional banding as required. No complications were encountered and the patient tolerated the procedure well.  I appreciate the opportunity to care for this patient. CC: Marrian Salvage, [place]

## 2018-06-03 NOTE — Assessment & Plan Note (Signed)
Left lateral banded with 2 bands today he will return in early December for follow-up and consideration for additional banding.  Clearly this is his most symptomatic hemorrhoid.  I explained that it might take more than one banding session to fix that one. He will continue MiraLAX and a half a dose a day to aim for easy soft bowel movements without diarrhea or constipation.

## 2018-06-06 ENCOUNTER — Telehealth: Payer: Self-pay | Admitting: Rheumatology

## 2018-06-06 ENCOUNTER — Encounter: Payer: Self-pay | Admitting: Family Medicine

## 2018-06-06 ENCOUNTER — Encounter: Payer: Self-pay | Admitting: Rheumatology

## 2018-06-06 NOTE — Telephone Encounter (Signed)
Patient calling to let you know he has not started OTC anti inflammatories on a regular basis as of yet. Patient wants to start now on Candied Ginger 4 cubes per day, Tumeric 1200mg  a day, Fish Oil 250mg  twice a day. Please call patient to advise.

## 2018-06-09 NOTE — Telephone Encounter (Signed)
Returned patient's call.  He stated he found the handout for natural anti-inflammatories.  States he will start taking turmeric 750 mg daily along with the candy ginger cubes.  Patient asked again if he should delay his appointment in order to assess how he responds to the supplements.  Instructed patient to keep this appointment on December 4.  Patient also inquiring about the Voltaren gel prescription.  He has not started using the medication and asked if that would be beneficial.  Counseled patient on mechanism of action, dosage, side effects, and appropriate administration.  States that his hands are the joints that bother him the most.  Instructed patient to use a Q-tip for application to minimize absorption on the palms.  Patient verbalized understanding.  All questions encouraged and answered.  Instructed patient to call with any other questions or concerns.

## 2018-06-09 NOTE — Telephone Encounter (Signed)
Thank you for informing me.

## 2018-06-11 DIAGNOSIS — Z87442 Personal history of urinary calculi: Secondary | ICD-10-CM | POA: Insufficient documentation

## 2018-06-11 DIAGNOSIS — I639 Cerebral infarction, unspecified: Secondary | ICD-10-CM | POA: Insufficient documentation

## 2018-06-11 DIAGNOSIS — M5136 Other intervertebral disc degeneration, lumbar region: Secondary | ICD-10-CM | POA: Insufficient documentation

## 2018-06-11 DIAGNOSIS — M1712 Unilateral primary osteoarthritis, left knee: Secondary | ICD-10-CM | POA: Insufficient documentation

## 2018-06-11 DIAGNOSIS — M19071 Primary osteoarthritis, right ankle and foot: Secondary | ICD-10-CM | POA: Insufficient documentation

## 2018-06-11 DIAGNOSIS — G834 Cauda equina syndrome: Secondary | ICD-10-CM | POA: Insufficient documentation

## 2018-06-11 DIAGNOSIS — M722 Plantar fascial fibromatosis: Secondary | ICD-10-CM | POA: Insufficient documentation

## 2018-06-11 DIAGNOSIS — M5030 Other cervical disc degeneration, unspecified cervical region: Secondary | ICD-10-CM | POA: Insufficient documentation

## 2018-06-11 DIAGNOSIS — M19072 Primary osteoarthritis, left ankle and foot: Secondary | ICD-10-CM | POA: Insufficient documentation

## 2018-06-11 NOTE — Progress Notes (Signed)
Office Visit Note  Patient: Russell [name]             Date of Birth: January 26, 1946           MRN: [id number]             PCP: Marrian Salvage, FNP Referring: Marrian Salvage,* Visit Date: 06/25/2018 Occupation: '@GUAROCC'$ @  Subjective:  Pain in joints.   History of Present Illness: Russell [name] is a 72 y.o. male with history of inflammatory arthritis.  Patient states that few weeks back he developed pain and swelling in his salivary gland.  He was seen in the emergency room where he was given antibiotics and anti-inflammatories.  He states after that his salivary gland swelling improved but he also noticed remarkable improvement in his hands.  He has been trying to take natural anti-inflammatories.  Once he stopped Naprosyn the swelling and pain came back.  He states he has not had any recurrence of plantar fasciitis in 1 month.  He is not sure if he has psoriasis.  He states that he has few lesions in the past which were not diagnosed as psoriasis for sure.  Activities of Daily Living:  Patient reports morning stiffness for 1 hour.   Patient Denies nocturnal pain.  Difficulty dressing/grooming: Denies Difficulty climbing stairs: Reports Difficulty getting out of chair: Denies Difficulty using hands for taps, buttons, cutlery, and/or writing: Reports  Review of Systems  Constitutional: Negative for fatigue and night sweats.  HENT: Negative for mouth sores, mouth dryness and nose dryness.   Eyes: Negative for redness and dryness.  Respiratory: Negative for shortness of breath and difficulty breathing.   Cardiovascular: Negative for chest pain, palpitations, hypertension, irregular heartbeat and swelling in legs/feet.  Gastrointestinal: Positive for constipation. Negative for diarrhea.  Endocrine: Negative for increased urination.  Musculoskeletal: Positive for arthralgias, joint pain, joint swelling and morning stiffness. Negative for myalgias, muscle weakness, muscle  tenderness and myalgias.  Skin: Positive for color change and rash. Negative for hair loss, nodules/bumps, skin tightness, ulcers and sensitivity to sunlight.  Allergic/Immunologic: Negative for susceptible to infections.  Neurological: Positive for weakness ( ). Negative for dizziness, fainting, memory loss and night sweats.  Hematological: Negative for swollen glands.  Psychiatric/Behavioral: Negative for depressed mood and sleep disturbance. The patient is not nervous/anxious.     PMFS History:  Patient Active Problem List   Diagnosis Date Noted  . DDD (degenerative disc disease), cervical 06/11/2018  . DDD (degenerative disc disease), lumbar 06/11/2018  . History of kidney stones 06/11/2018  . 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 Primary osteoarthritis of left knee 06/11/2018  . Primary osteoarthritis of both feet 06/11/2018  . Plantar fasciitis 06/11/2018  . Hx of colonic polyp 05/29/2018  . Left lumbar radiculopathy 01/07/2018  . Diastolic dysfunction 97/94/8016  . Psoriasis 10/15/2016  . DEGENERATIVE JOINT DISEASE 02/07/2009  . HEADACHE 02/07/2009  . GERD 10/02/2007  . HYPERTENSION, BENIGN ESSENTIAL 10/08/2006  . 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 ERECTILE DYSFUNCTION 09/10/2006  . Prolapsed internal hemorrhoids, grade 3 09/10/2006  . PLANTAR FASCIITIS, BILATERAL 09/10/2006  . SLEEP APNEA 09/10/2006  . MALAISE AND FATIGUE 09/10/2006    Past Medical History:  Diagnosis Date  . Anemia 2019  . Arthritis   . Bleeding internal hemorrhoids 09/10/2006   Qualifier: Diagnosis of  By: Jonna Munro MD, Roderic Scarce    . 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[place]) 2011  . 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 Cervical  spine degeneration   . Constipation   . Hemorrhoids   . Hx of colonic polyp 05/29/2018   05/2018 cecal ssp  . Kidney stone    Remote  . Maxillary sinus polyp   . Prolapsed internal hemorrhoids, grade 3 09/10/2006   Qualifier: Diagnosis of  By:  Jonna Munro MD, Roderic Scarce    . Salivary gland enlargement   . Sleep apnea 10 years ago   declined CPAP  . Stroke ([place]) 15-20 years ago    Family History  Problem Relation Age of Onset  . Aortic aneurysm Mother   . Heart attack Father   . Aortic aneurysm Sister   . CVA Sister   . Colon cancer Neg Hx   . Pancreatic cancer Neg Hx   . Rectal cancer Neg Hx   . Stomach cancer Neg Hx    Past Surgical History:  Procedure Laterality Date  . COLONOSCOPY    . HEMORRHOID BANDING    . LUMBAR LAMINECTOMY  03/08/2010   L2-3, cauda equina syndrome  . 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 He appears well-developed and well-nourished.  HENT:  Head: Normocephalic and atraumatic.  Eyes: Pupils are equal, round, and reactive to light. Conjunctivae and EOM are normal.  Neck: Normal range of motion. Neck supple.  Cardiovascular: Normal rate, regular rhythm and normal heart sounds.  Pulmonary/Chest: Effort normal and breath sounds normal.  Abdominal: Soft. Bowel sounds are normal.  Neurological: He is alert and oriented to person, place, and time.  Loss of left thenar muscle noted.  Skin: Skin is warm and dry. Capillary refill takes less than 2 seconds.  Psychiatric: He has a normal mood and affect. His behavior is normal.  Nursing note and vitals reviewed.    Musculoskeletal Exam: C-spine and lumbar spine limited range of motion.  Shoulder joints elbow joints wrist joints with good  range of motion.  He has synovitis over her right second fourth and fifth PIP joints and left second and fourth PIP joints.  Knee joints, ankle joints, MTPs PIPs were in good range of motion.  There was no evidence of plantar fasciitis.  CDAI Exam: CDAI Score: Not documented Patient Global Assessment: Not documented; Provider Global Assessment: Not documented Swollen: Not documented; Tender: Not documented Joint Exam   Not documented   There is currently no information documented on the homunculus. Go to the Rheumatology activity and complete the homunculus joint exam.  Investigation: No additional findings.  Imaging: Ct Soft Tissue Neck W Contrast  Result Date: 06/13/2018 CLINICAL DATA:  Fever. Increased pain at the angle of the mandible on the right. Soft tissue swelling inferior and posterior to the angle of right mandible. Recent dental work on tooth #31. EXAM: CT NECK WITH CONTRAST TECHNIQUE: Multidetector CT imaging of the neck was performed using the standard protocol  following the bolus administration of intravenous contrast. CONTRAST:  41m ISOVUE-300 IOPAMIDOL (ISOVUE-300) INJECTION 61% COMPARISON:  None. FINDINGS: Pharynx and larynx: No focal mucosal or submucosal lesions are present. Nasopharynx is within normal limits. Soft palate and tongue base are unremarkable. Phlegm a tori changes are present in the right parapharyngeal fat. Epiglottis and vallecular normal. Hypopharynx is within normal limits. Vocal cords are midline and symmetric. Trachea is unremarkable. Salivary glands: There is diffuse edema and heterogeneous enhancement in the right parotid gland. Punctate calcification is present in the deep lobe. No obstructive disease is present. There is no abscess or other mass lesion. A punctate calcification is present inferiorly in the left parotid gland. Left parotid gland is otherwise normal. The submandibular glands are within normal limits bilaterally. There is some inflammatory  change adjacent to the right sub mandibular gland. Thyroid: Normal. Lymph nodes: Enlarged right level 2 lymph nodes appear to be reactive. No pathologic nodal enlargement is evident. Increased number of nodes are present at the right level 1 and level 2 stations. Vascular: Mild atherosclerotic changes are present at the left carotid bifurcation without a significant luminal stenosis. No other vascular lesions are present. Limited intracranial: Visualized intracranial contents are within normal limits. Visualized orbits: Globes and orbits are normal. Mastoids and visualized paranasal sinuses: Mild mucosal thickening is present near the ostiomeatal complex on the left. Polyps or mucous retention cysts are noted inferiorly in the right maxillary sinus. No other mass lesion is present. The paranasal sinuses and mastoid air cells are otherwise clear. Skeleton: Multilevel degenerative changes are present cervical spine. Facet disease and degenerative anterolisthesis is present at C4-5. There is chronic loss of disc height and uncovertebral disease at C5-6 and C6-7. Acute findings results an osseous foraminal narrowing bilaterally at C4-5, C5-6, and C6-7. Root canal is evident in tooth #31. No periapical lucency is present. There is no focal inflammatory change about the mandible at this level or subperiosteal abscess. This does not appear to be the source of infection/inflammation. Upper chest: Lung apices are clear. Thoracic inlet is within normal limits. IMPRESSION: 1. Asymmetric enlargement inflammatory changes of the right parotid gland. Findings are most consistent with a nonspecific parotitis. No obstructive disease is present. 2. Diffuse inflammatory changes and reactive adenopathy in the anterior right neck as described. 3. Root canal at tooth #31 without associated apical abscess or subperiosteal abscess. This does not appear to be the source of infection/inflammation. 4. Inflammatory changes extend into the  right parapharyngeal space without a discrete abscess. 5. Atherosclerotic changes in the left carotid bifurcation without significant stenosis. 6. Multilevel degenerative changes in the cervical spine as described. Electronically Signed   By: CSan MorelleM.D.   On: 06/13/2018 12:23    Recent Labs: Lab Results  Component Value Date   WBC 13.8 (H) 06/13/2018   HGB 12.9 (L) 06/13/2018   PLT 212 06/13/2018   NA 136 06/13/2018   K 4.1 06/13/2018   CL 100 06/13/2018   CO2 27 06/13/2018   GLUCOSE 114 (H) 06/13/2018   BUN 11 06/13/2018   CREATININE 0.82 06/13/2018   BILITOT 1.1 02/10/2009   ALKPHOS 41 02/10/2009   AST 19 02/10/2009   ALT 15 02/10/2009   PROT 6.8 02/10/2009   ALBUMIN 4.2 02/10/2009   CALCIUM 9.2 06/13/2018   GFRAA >60 06/13/2018  May 21, 2018 RF negative, _0 eta negative, CCP negative, ESR 11, uric acid 5.5, HLA-B27 negative  Speciality Comments: No specialty comments available.  Procedures:  No procedures performed Allergies: Patient has no known allergies.   Assessment / Plan:     Visit Diagnoses: Primary osteoarthritis of both hands - X-ray revealed second and third MCP joint narrowing and PIP and DIP severe narrowing.  Differential diagnosis would be inflammatory osteoarthritis or PsA.  Patient states he continues to have some pain and stiffness in his hands.  He tried naproxen for short-term which had very good response.  He wants to go back on naproxen.  He states he has been getting prescription from his PCP.  We discussed the side effects of naproxen.  GI hepatic and renal toxicities were discussed at length.  He is also taking natural anti-inflammatories.  I have advised him to monitor labs with his PCP while he is on NSAIDs.  Patient was treated with methotrexate and some anti-TNF's at [facility] rheumatology in the past for possible psoriatic arthritis with no improvement in his symptoms.  Primary osteoarthritis of left knee-he is currently not  having much discomfort.  Primary osteoarthritis of both feet-proper fitting shoes were discussed.  Plantar fasciitis-he had one episode of plantar fasciitis 1 month ago without recurrence.  DDD (degenerative disc disease), cervical-he had seen Dr. Reuel Boom in the past.  He does have left thenar muscle atrophy.  I have advised him to discuss that further with Dr. Christella Noa.  DDD (degenerative disc disease), lumbar-he had lower back discomfort and also had some lower extremity weakness.  Psoriasis-the diagnosis of psoriasis is questionable.  Patient states he had some rash on his face before but it was never biopsied.  HYPERTENSION, BENIGN ESSENTIAL  Diastolic dysfunction  History of gastroesophageal reflux (GERD)  History of kidney stones  Cerebellar infarct ([facility])  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[place]) - Listed under history   Orders: No orders of the defined types were placed in this encounter.  No orders of the defined types were placed in this encounter.   Face-to-face time spent with patient was 45 minutes. Greater than 50% of time was spent in counseling and coordination of care.  Follow-Up Instructions: Return in about 4 months (around 10/25/2018) for Osteoarthritis,DDD.   Bo Merino, MD  Note - This record has been created using Editor, commissioning.  Chart creation errors have been sought, but may not always  have been located. Such creation errors do not reflect on  the standard of medical care.

## 2018-06-13 ENCOUNTER — Emergency Department (HOSPITAL_COMMUNITY)
Admission: EM | Admit: 2018-06-13 | Discharge: 2018-06-13 | Disposition: A | Discharge status: Home / Self Care | Payer: Medicare Other | Attending: Emergency Medicine | Admitting: Emergency Medicine

## 2018-06-13 ENCOUNTER — Encounter (HOSPITAL_COMMUNITY): Payer: Self-pay | Admitting: Emergency Medicine

## 2018-06-13 ENCOUNTER — Emergency Department (HOSPITAL_COMMUNITY): Payer: Medicare Other

## 2018-06-13 ENCOUNTER — Other Ambulatory Visit: Payer: Self-pay

## 2018-06-13 DIAGNOSIS — K1120 Sialoadenitis, unspecified: Secondary | ICD-10-CM | POA: Diagnosis not present

## 2018-06-13 DIAGNOSIS — Z87891 Personal history of nicotine dependence: Secondary | ICD-10-CM | POA: Diagnosis not present

## 2018-06-13 DIAGNOSIS — K1121 Acute sialoadenitis: Secondary | ICD-10-CM

## 2018-06-13 DIAGNOSIS — Z79899 Other long term (current) drug therapy: Secondary | ICD-10-CM | POA: Diagnosis not present

## 2018-06-13 DIAGNOSIS — R6884 Jaw pain: Secondary | ICD-10-CM | POA: Diagnosis present

## 2018-06-13 LAB — CBC WITH DIFFERENTIAL/PLATELET
ABS IMMATURE GRANULOCYTES: 0.04 10*3/uL (ref 0.00–0.07)
BASOS PCT: 0 %
Basophils Absolute: 0 10*3/uL (ref 0.0–0.1)
Eosinophils Absolute: 0 10*3/uL (ref 0.0–0.5)
Eosinophils Relative: 0 %
HCT: 41.4 % (ref 39.0–52.0)
Hemoglobin: 12.9 g/dL — ABNORMAL LOW (ref 13.0–17.0)
Immature Granulocytes: 0 %
Lymphocytes Relative: 10 %
Lymphs Abs: 1.4 10*3/uL (ref 0.7–4.0)
MCH: 24.8 pg — ABNORMAL LOW (ref 26.0–34.0)
MCHC: 31.2 g/dL (ref 30.0–36.0)
MCV: 79.6 fL — ABNORMAL LOW (ref 80.0–100.0)
Monocytes Absolute: 1.3 10*3/uL — ABNORMAL HIGH (ref 0.1–1.0)
Monocytes Relative: 9 %
NEUTROS ABS: 11.1 10*3/uL — AB (ref 1.7–7.7)
NEUTROS PCT: 81 %
Platelets: 212 10*3/uL (ref 150–400)
RBC: 5.2 MIL/uL (ref 4.22–5.81)
RDW: 14.6 % (ref 11.5–15.5)
WBC: 13.8 10*3/uL — ABNORMAL HIGH (ref 4.0–10.5)
nRBC: 0 % (ref 0.0–0.2)

## 2018-06-13 LAB — BASIC METABOLIC PANEL
Anion gap: 9 (ref 5–15)
BUN: 11 mg/dL (ref 8–23)
CO2: 27 mmol/L (ref 22–32)
Calcium: 9.2 mg/dL (ref 8.9–10.3)
Chloride: 100 mmol/L (ref 98–111)
Creatinine, Ser: 0.82 mg/dL (ref 0.61–1.24)
GFR calc non Af Amer: >60 mL/min (ref >60)
Glucose, Bld: 114 mg/dL — ABNORMAL HIGH (ref 70–99)
Potassium: 4.1 mmol/L (ref 3.5–5.1)
Sodium: 136 mmol/L (ref 135–145)

## 2018-06-13 MED ORDER — CLINDAMYCIN HCL 300 MG PO CAPS: 300.0000 mg | ORAL_CAPSULE | Freq: Three times a day (TID) | ORAL | 0 refills | Status: DC

## 2018-06-13 MED ORDER — CIPROFLOXACIN HCL 750 MG PO TABS: 750.0000 mg | ORAL_TABLET | Freq: Two times a day (BID) | ORAL | 0 refills | Status: DC

## 2018-06-13 MED ORDER — IOPAMIDOL (ISOVUE-300) INJECTION 61%
75.0000 mL | Freq: Once | INTRAVENOUS | Status: AC | PRN
  Administered 2018-06-13: 75 mL via INTRAVENOUS

## 2018-06-13 MED ORDER — SODIUM CHLORIDE 0.9 % IV BOLUS
500.0000 mL | Freq: Once | INTRAVENOUS | Status: AC
  Administered 2018-06-13: 500 mL via INTRAVENOUS

## 2018-06-13 MED ORDER — VANCOMYCIN HCL IN DEXTROSE 1-5 GM/200ML-% IV SOLN
1000.0000 mg | Freq: Two times a day (BID) | INTRAVENOUS | Status: DC
  Filled 2018-06-13: qty 200

## 2018-06-13 MED ORDER — VANCOMYCIN HCL 10 G IV SOLR
1750.0000 mg | Freq: Once | INTRAVENOUS | Status: AC
  Administered 2018-06-13: 1750 mg via INTRAVENOUS
  Filled 2018-06-13: qty 1750

## 2018-06-13 NOTE — ED Notes (Signed)
Pt to CT. Will take vitals upon return

## 2018-06-13 NOTE — ED Triage Notes (Signed)
Pt reports had root canal 11/21. Pt reports on 11/27 right jaw began to swell and "hard mass" developed. Pt denies any known fever and increased pain in right jaw. Airway patent. Pt reports consulted dentist but reports no answer.

## 2018-06-13 NOTE — Discharge Instructions (Addendum)
You have an inflamed right parotid gland.  Precription for 2 antibiotics.  Moist heat.  Increase fluids.  Tylenol or ibuprofen for pain.  Follow-up with otolaryngologist.  Phone number given.

## 2018-06-13 NOTE — ED Notes (Signed)
Have paged pharmacy

## 2018-06-13 NOTE — Progress Notes (Signed)
Pharmacy Antibiotic Note  Russell [name] is a 72 y.o. male admitted on 06/13/2018 with parotitis.  Pharmacy has been consulted for Vancomycin dosing.  Plan: Vancomycin 1750 mg IV x 1 dose. Vancomycin 1000 mg IV every 12 hours.  Goal trough 15-20 mcg/mL.  Monitor labs, c/s, and vanco trough as indicated.  Height: 6\' 2"  (188 cm) Weight: 180 lb (81.6 kg) IBW/kg (Calculated) : 82.2  Temp (24hrs), Avg:99.7 F (37.6 C), Min:99.7 F (37.6 C), Max:99.7 F (37.6 C)  Recent Labs  Lab 06/13/18 1020  WBC 13.8*  CREATININE 0.82    Estimated Creatinine Clearance: 96.7 mL/min (by C-G formula based on SCr of 0.82 mg/dL).    No Known Allergies  Antimicrobials this admission: Vanco 11/29 >>      Dose adjustments this admission: N/A  Microbiology results: None pending  Thank you for allowing pharmacy to be a part of this patient's care.  Ramond Craver 06/13/2018 1:11 PM

## 2018-06-14 ENCOUNTER — Encounter: Payer: Self-pay | Admitting: Family

## 2018-06-16 ENCOUNTER — Encounter: Payer: Self-pay | Admitting: Family

## 2018-06-16 ENCOUNTER — Other Ambulatory Visit: Payer: Self-pay | Admitting: Family

## 2018-06-16 DIAGNOSIS — K1120 Sialoadenitis, unspecified: Secondary | ICD-10-CM

## 2018-06-16 NOTE — ED Provider Notes (Signed)
[facility] EMERGENCY DEPARTMENT Provider Note   CSN: [id number] Arrival date & time: 06/13/18  2993     History   Chief Complaint Chief Complaint  Patient presents with  . Jaw Pain    HPI Russell [name] is a 72 y.o. male.  Patient reports swelling at the angle of the right mandible since 06/11/2018.  Status post root canal on tooth #31 on 06/05/2018.  No fever, sweats, chills.  Russell [name] is able to swallow.  Russell [name] attempted to call his dentist but to no avail.  No history of diabetes.  Severity of symptoms is moderate.  Palpation makes pain worse.     Past Medical History:  Diagnosis Date  . Anemia 2019  . Arthritis   . Bleeding internal hemorrhoids 09/10/2006   Qualifier: Diagnosis of  By: Jonna Munro MD, Roderic Scarce    . 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[place]) 2011  . Cerebellar infarct ([place])   . Cervical spine degeneration   . Constipation   . Hemorrhoids   . Hx of colonic polyp 05/29/2018   05/2018 cecal ssp  . Kidney stone    Remote  . Maxillary sinus polyp   . Prolapsed internal hemorrhoids, grade 3 09/10/2006   Qualifier: Diagnosis of  By: Jonna Munro MD, Roderic Scarce    . Sleep apnea 10 years ago   declined CPAP  . Stroke ([place]) 15-20 years ago    Patient Active Problem List   Diagnosis Date Noted  . DDD (degenerative disc disease), cervical 06/11/2018  . DDD (degenerative disc disease), lumbar 06/11/2018  . History of kidney stones 06/11/2018  . 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[place]) 06/11/2018  . Primary osteoarthritis of left knee 06/11/2018  . Primary osteoarthritis of both feet 06/11/2018  . Plantar fasciitis 06/11/2018  . Hx of colonic polyp 05/29/2018  . Left lumbar radiculopathy 01/07/2018  . Diastolic dysfunction 71/69/6789  . Psoriasis 10/15/2016  . DEGENERATIVE JOINT DISEASE 02/07/2009  . HEADACHE 02/07/2009  . GERD 10/02/2007  . HYPERTENSION, BENIGN ESSENTIAL 10/08/2006  . 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 ERECTILE DYSFUNCTION  09/10/2006  . Prolapsed internal hemorrhoids, grade 3 09/10/2006  . PLANTAR FASCIITIS, BILATERAL 09/10/2006  . SLEEP APNEA 09/10/2006  . MALAISE AND FATIGUE 09/10/2006    Past Surgical History:  Procedure Laterality Date  . COLONOSCOPY    . HEMORRHOID BANDING    . LUMBAR LAMINECTOMY  03/08/2010   L2-3, cauda equina syndrome  . UPPER GI ENDOSCOPY          Home Medications    Prior to Admission medications   Medication Sig Start Date End Date Taking? Authorizing Provider  Cyanocobalamin (VITAMIN B-12 PO) Take 1,000 mcg by mouth daily.   Yes [provider]  diclofenac sodium (VOLTAREN) 1 % GEL 3 grams to 3 large joints up to 3 times daily 05/21/18  Yes Deveshwar, Abel Presto, MD  hydrocortisone (ANUSOL-HC) 2.5 % rectal cream Place 1 application rectally 2 (two) times daily. Apply onto hemorrhoids and into rectum 05/30/18  Yes Gatha Mayer, MD  IRON PO Take 50 mg by mouth daily.   Yes [provider]  Omega-3 Fatty Acids (FISH OIL) 1200 MG CAPS Take 1 capsule by mouth 2 (two) times daily.   Yes [provider]  omeprazole (PRILOSEC) 40 MG capsule Take 40 mg by mouth daily.    Yes [provider]  pyridOXINE (VITAMIN B-6) 50 MG tablet Take 50 mg by mouth daily.   Yes [provider]  SOOLANTRA 1 %  CREA as needed.  02/06/18  Yes [provider]  TURMERIC PO Take 500 mg by mouth daily.   Yes [provider]  vitamin C (ASCORBIC ACID) 500 MG tablet Take 500 mg by mouth daily.   Yes [provider]  ciprofloxacin (CIPRO) 750 MG tablet Take 1 tablet (750 mg total) by mouth 2 (two) times daily. 06/13/18   Nat Christen, MD  clindamycin (CLEOCIN) 300 MG capsule Take 1 capsule (300 mg total) by mouth 3 (three) times daily. 06/13/18   Nat Christen, MD  niacin 500 MG tablet Take 500 mg by mouth at bedtime.    [provider]  NON FORMULARY 1 mL. Mushroom powder. One teaspoonful every morning.    [provider]      Family History Family History  Problem Relation Age of Onset  . Aortic aneurysm Mother   . Heart attack Father   . Aortic aneurysm Sister   . CVA Sister   . Colon cancer Neg Hx   . Pancreatic cancer Neg Hx   . Rectal cancer Neg Hx   . Stomach cancer Neg Hx     Social History Social History   Tobacco Use  . Smoking status: Former Smoker    Packs/day: 0.25    Types: Cigarettes    Start date: 10/15/1965    Last attempt to quit: 10/16/1974    Years since quitting: 43.6  . Smokeless tobacco: Never Used  Substance Use Topics  . Alcohol use: Yes    Comment: rarely wine  . Drug use: No     Allergies   Patient has no known allergies.   Review of Systems Review of Systems  All other systems reviewed and are negative.    Physical Exam Updated Vital Signs BP 119/78 (BP Location: Left Arm)   Pulse 89   Temp 99.7 F (37.6 C) (Oral)   Resp 12   Ht 6\' 2"  (1.88 m)   Wt 81.6 kg   SpO2 98%   BMI 23.11 kg/m   Physical Exam  Constitutional: Russell [name] is oriented to person, place, and time. Russell [name] appears well-developed and well-nourished.  HENT:  Head: Normocephalic and atraumatic.  Eyes: Conjunctivae are normal.  Neck:  Tender mass approximately 2.5 cm's in diameter in the distribution of the right parotid gland  Cardiovascular: Normal rate and regular rhythm.  Pulmonary/Chest: Effort normal and breath sounds normal.  Abdominal: Soft. Bowel sounds are normal.  Musculoskeletal: Normal range of motion.  Neurological: Russell [name] is alert and oriented to person, place, and time.  Skin: Skin is warm and dry.  Psychiatric: Russell [name] has a normal mood and affect. His behavior is normal.  Nursing note and vitals reviewed.    ED Treatments / Results  Labs (all labs ordered are listed, but only abnormal results are displayed) Labs Reviewed  CBC WITH DIFFERENTIAL/PLATELET - Abnormal; Notable for the following components:      Result Value   WBC 13.8 (*)    Hemoglobin 12.9 (*)    MCV 79.6 (*)     MCH 24.8 (*)    Neutro Abs 11.1 (*)    Monocytes Absolute 1.3 (*)    All other components within normal limits  BASIC METABOLIC PANEL - Abnormal; Notable for the following components:   Glucose, Bld 114 (*)    All other components within normal limits    EKG None  Radiology No results found.  Procedures Procedures (including critical care time)  Medications Ordered in ED Medications  sodium  chloride 0.9 % bolus 500 mL (0 mLs Intravenous Stopped 06/13/18 1138)  iopamidol (ISOVUE-300) 61 % injection 75 mL (75 mLs Intravenous Contrast Given 06/13/18 1143)  vancomycin (VANCOCIN) 1,750 mg in sodium chloride 0.9 % 500 mL IVPB (0 mg Intravenous Stopped 06/13/18 1641)     Initial Impression / Assessment and Plan / ED Course  I have reviewed the triage vital signs and the nursing notes.  Pertinent labs & imaging results that were available during my care of the patient were reviewed by me and considered in my medical decision making (see chart for details).     Patient presents with swelling in the angle of the right mandible.  CT of neck reveals nonspecific parotitis with reactive adenopathy.  No abscess noted.  IV vancomycin given in the ED.  Discharge medications clindamycin and Cipro.  Patient will follow-up with his dentist.  Final Clinical Impressions(s) / ED Diagnoses   Final diagnoses:  Parotitis, acute    ED Discharge Orders         Ordered    clindamycin (CLEOCIN) 300 MG capsule  3 times daily     06/13/18 1500    ciprofloxacin (CIPRO) 750 MG tablet  2 times daily     06/13/18 1500           Nat Christen, MD 06/16/18 2229

## 2018-06-18 ENCOUNTER — Ambulatory Visit: Payer: Medicare Other | Admitting: Internal Medicine

## 2018-06-18 ENCOUNTER — Encounter: Payer: Self-pay | Admitting: Internal Medicine

## 2018-06-18 DIAGNOSIS — K642 Third degree hemorrhoids: Secondary | ICD-10-CM

## 2018-06-18 NOTE — Assessment & Plan Note (Addendum)
Banded RP and LL posterior aspect today Titrate MiraLAX Return December 19

## 2018-06-18 NOTE — Progress Notes (Signed)
   Hemorrhoid ligation  Grade 3 prolapsed left lateral internal hemorrhoid banded on 06/03/2018 Improved symptoms for a few weeks have returned Trying to regulate defecation titrating MiraLAX -also started on antibiotics for parotitis so stools are looser  Ductal exam shows a bulge in the left lateral anoderm area  DRE is normal though hemorrhoids are palpable  Anoscopy shows a rather prominent left lateral internal hemorrhoid and still grade 2 RP and RA internal hemorrhoids.  No ulceration seen.   Ligation of the right posterior internal hemorrhoid was performed with rubber band ligator, preceded by 0.125% nitroglycerin ointment and percent lidocaine application.  A second band was placed in the left lateral posterior position as well.  Tolerated this well the bands were adjusted and he had no significant pain or discomfort.   Plan is to titrate the MiraLAX dosing downward somewhat to provide stools that are less soft.  I have explained that this large left lateral internal hemorrhoid can be treated with banding though I am not 100% convinced it will resolve it but he wants to continue to try with banding.  He will return in a few weeks for valuation and possible repeat banding  He is also working on reducing carbohydrates in his diet and his GERD symptoms are much better now that he is back on PPI.   I appreciate the opportunity to care for this patient. CC: Marrian Salvage, [place]

## 2018-06-18 NOTE — Patient Instructions (Signed)
HEMORRHOID BANDING PROCEDURE    FOLLOW-UP CARE   1. The procedure you have had should have been relatively painless since the banding of the area involved does not have nerve endings and there is no pain sensation.  The rubber band cuts off the blood supply to the hemorrhoid and the band may fall off as soon as 48 hours after the banding (the band may occasionally be seen in the toilet bowl following a bowel movement). You may notice a temporary feeling of fullness in the rectum which should respond adequately to plain Tylenol or Motrin.  2. Following the banding, avoid strenuous exercise that evening and resume full activity the next day.  A sitz bath (soaking in a warm tub) or bidet is soothing, and can be useful for cleansing the area after bowel movements.     3. To avoid constipation, take two tablespoons of natural wheat bran, natural oat bran, flax, Benefiber or any over the counter fiber supplement and increase your water intake to 7-8 glasses daily.    4. Unless you have been prescribed anorectal medication, do not put anything inside your rectum for two weeks: No suppositories, enemas, fingers, etc.  5. Occasionally, you may have more bleeding than usual after the banding procedure.  This is often from the untreated hemorrhoids rather than the treated one.  Don't be concerned if there is a tablespoon or so of blood.  If there is more blood than this, lie flat with your bottom higher than your head and apply an ice pack to the area. If the bleeding does not stop within a half an hour or if you feel faint, call our office at [phone] or go to the emergency room.  6. Problems are not common; however, if there is a substantial amount of bleeding, severe pain, chills, fever or difficulty passing urine (very rare) or other problems, you should call us at [phone] or report to the nearest emergency room.  7. Do not stay seated continuously for more than 2-3 hours for a day or two  after the procedure.  Tighten your buttock muscles 10-15 times every two hours and take 10-15 deep breaths every 1-2 hours.  Do not spend more than a few minutes on the toilet if you cannot empty your bowel; instead re-visit the toilet at a later time.   You are scheduled for your 3rd banding on 07/03/2018 at 11:30am.

## 2018-06-25 ENCOUNTER — Ambulatory Visit: Payer: Medicare Other | Admitting: Rheumatology

## 2018-06-25 ENCOUNTER — Encounter: Payer: Self-pay | Admitting: Rheumatology

## 2018-06-25 VITALS — BP 117/76 | HR 82 | Resp 15 | Ht 74.0 in | Wt 183.0 lb

## 2018-06-25 DIAGNOSIS — M19041 Primary osteoarthritis, right hand: Secondary | ICD-10-CM | POA: Diagnosis not present

## 2018-06-25 DIAGNOSIS — M722 Plantar fascial fibromatosis: Secondary | ICD-10-CM

## 2018-06-25 DIAGNOSIS — M19071 Primary osteoarthritis, right ankle and foot: Secondary | ICD-10-CM

## 2018-06-25 DIAGNOSIS — M5030 Other cervical disc degeneration, unspecified cervical region: Secondary | ICD-10-CM

## 2018-06-25 DIAGNOSIS — L409 Psoriasis, unspecified: Secondary | ICD-10-CM

## 2018-06-25 DIAGNOSIS — Z8719 Personal history of other diseases of the digestive system: Secondary | ICD-10-CM

## 2018-06-25 DIAGNOSIS — M19072 Primary osteoarthritis, left ankle and foot: Secondary | ICD-10-CM

## 2018-06-25 DIAGNOSIS — I10 Essential (primary) hypertension: Secondary | ICD-10-CM

## 2018-06-25 DIAGNOSIS — M5136 Other intervertebral disc degeneration, lumbar region: Secondary | ICD-10-CM

## 2018-06-25 DIAGNOSIS — I639 Cerebral infarction, unspecified: Secondary | ICD-10-CM

## 2018-06-25 DIAGNOSIS — M1712 Unilateral primary osteoarthritis, left knee: Secondary | ICD-10-CM | POA: Diagnosis not present

## 2018-06-25 DIAGNOSIS — Z87442 Personal history of urinary calculi: Secondary | ICD-10-CM

## 2018-06-25 DIAGNOSIS — G834 Cauda equina syndrome: Secondary | ICD-10-CM

## 2018-06-25 DIAGNOSIS — M19042 Primary osteoarthritis, left hand: Secondary | ICD-10-CM

## 2018-06-25 DIAGNOSIS — I5189 Other ill-defined heart diseases: Secondary | ICD-10-CM

## 2018-07-03 ENCOUNTER — Ambulatory Visit: Payer: Medicare Other | Admitting: Internal Medicine

## 2018-07-03 ENCOUNTER — Encounter: Payer: Self-pay | Admitting: Internal Medicine

## 2018-07-03 VITALS — BP 102/64 | HR 59 | Ht 74.0 in | Wt 189.2 lb

## 2018-07-03 DIAGNOSIS — Z791 Long term (current) use of non-steroidal anti-inflammatories (NSAID): Secondary | ICD-10-CM

## 2018-07-03 DIAGNOSIS — K642 Third degree hemorrhoids: Secondary | ICD-10-CM | POA: Diagnosis not present

## 2018-07-03 NOTE — Assessment & Plan Note (Addendum)
Observe He is improved with diet and toileting changes and the banding May need more banding but will reassess in 1 month or so

## 2018-07-03 NOTE — Progress Notes (Signed)
   Russell [name] 72 y.o. 1946-04-24 [id number]  Assessment & Plan:   Encounter Diagnoses  Name Primary?  . Prolapsed internal hemorrhoids, grade 3 Yes  . NSAID long-term use     Prolapsed internal hemorrhoids, grade 2 and 3 Observe He is improved with diet and toileting changes and the banding May need more banding but will reassess in 1 month or so   Since he is on a daily PPI risk of NSAID gastrits and ulcer disease is less so I think current NSAID Tx plan acceptable esp as his quality of life is better w/ NSAID Tx     Subjective:   Chief Complaint: hemorrhoids  HPI  Here for f/u after hemorrhoid banding x 2 Is better - still w/ some prolapse but spontaneous retraction moreso than not. Less if any bleeding. Eating higher fiber turmeric cereal and probiotics and not needing MiraLax to defecate. Better bowel habits - focused, relaxed and deep breathing on toilet. Overall much better.  Had banding of LL 11/19 and the LL and RP 12/4.   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 Estanislado Pandy told me to check with you"  OIs on PPi for GERD with relief  Review of Systems   Objective:   Physical Exam BP 102/64   Pulse (!) 59   Ht 6\' 2"  (1.88 m)   Wt 189 lb 3.2 oz (85.8 kg)   SpO2 97%   BMI 24.29 kg/m  NAD  Rectal - sl bulge of LL hemorrhoid tissue on Valsalva but no prolapse as in past  15 minutes time spent with patient > half in counseling coordination of care

## 2018-07-03 NOTE — Patient Instructions (Signed)
We hope you have a Merry Christmas and we will see you back in January.   I appreciate the opportunity to care for you. Silvano Rusk, MD, [facility]

## 2018-07-21 ENCOUNTER — Other Ambulatory Visit: Payer: Self-pay | Admitting: Family

## 2018-07-21 MED ORDER — ZOSTER VAC RECOMB ADJUVANTED 50 MCG/0.5ML IM SUSR: 0.5000 mL | Freq: Once | INTRAMUSCULAR | 1 refills | Status: AC

## 2018-08-01 ENCOUNTER — Encounter: Payer: Self-pay | Admitting: Internal Medicine

## 2018-08-01 ENCOUNTER — Ambulatory Visit: Payer: Medicare Other | Admitting: Internal Medicine

## 2018-08-01 DIAGNOSIS — K642 Third degree hemorrhoids: Secondary | ICD-10-CM

## 2018-08-01 NOTE — Assessment & Plan Note (Addendum)
LL banded w/ 2 bands today follow-up as needed

## 2018-08-01 NOTE — Progress Notes (Signed)
   Hemorrhoid ligation  Sxs: Prolpase recurrent left lateral  The patient is here requesting evaluation and treatment of chronic recurrent prolapsing left lateral hemorrhoid which is improved but still is somewhat problematic.  His IBS and defecation symptoms are much better on a regimen of MiraLAX and diet changes.  Rectal exam reveals anal mucosal bulge without frank prolapse with Valsalva on the left lateral  Digital rectal exam is normal without mass or tenderness there is fullness in the left lateral hemorrhoid column  Anoscopy confirms a grade 2 at least prolapsed internal left lateral hemorrhoid  PROCEDURE NOTE: The patient presents with symptomatic grade 3  hemorrhoids, requesting rubber band ligation of his/her hemorrhoidal disease.  All risks, benefits and alternative forms of therapy were described and informed consent was obtained.   The anorectum was pre-medicated with 0.125% nitroglycerin and 5% lidocaine The decision was made to band the left lateral internal hemorrhoid, and the Crawford was used to perform band ligation without complication.   2 bands placed in this region given the size of the hemorrhoid Digital anorectal examination was then performed to assure proper positioning of the band, and to adjust the banded tissue as required.  The patient was discharged home without pain or other issues.  Dietary and behavioral recommendations were given and along with follow-up instructions.     The patient will return as needed for  follow-up and possible additional banding as required. No complications were encountered and the patient tolerated the procedure well.  I appreciate the opportunity to care for this patient. CC: Marrian Salvage, [place]

## 2018-08-01 NOTE — Patient Instructions (Signed)
HEMORRHOID BANDING PROCEDURE    FOLLOW-UP CARE   1. The procedure you have had should have been relatively painless since the banding of the area involved does not have nerve endings and there is no pain sensation.  The rubber band cuts off the blood supply to the hemorrhoid and the band may fall off as soon as 48 hours after the banding (the band may occasionally be seen in the toilet bowl following a bowel movement). You may notice a temporary feeling of fullness in the rectum which should respond adequately to plain Tylenol or Motrin.  2. Following the banding, avoid strenuous exercise that evening and resume full activity the next day.  A sitz bath (soaking in a warm tub) or bidet is soothing, and can be useful for cleansing the area after bowel movements.     3. To avoid constipation, take two tablespoons of natural wheat bran, natural oat bran, flax, Benefiber or any over the counter fiber supplement and increase your water intake to 7-8 glasses daily.    4. Unless you have been prescribed anorectal medication, do not put anything inside your rectum for two weeks: No suppositories, enemas, fingers, etc.  5. Occasionally, you may have more bleeding than usual after the banding procedure.  This is often from the untreated hemorrhoids rather than the treated one.  Don't be concerned if there is a tablespoon or so of blood.  If there is more blood than this, lie flat with your bottom higher than your head and apply an ice pack to the area. If the bleeding does not stop within a half an hour or if you feel faint, call our office at [phone] or go to the emergency room.  6. Problems are not common; however, if there is a substantial amount of bleeding, severe pain, chills, fever or difficulty passing urine (very rare) or other problems, you should call us at [phone] or report to the nearest emergency room.  7. Do not stay seated continuously for more than 2-3 hours for a day or two  after the procedure.  Tighten your buttock muscles 10-15 times every two hours and take 10-15 deep breaths every 1-2 hours.  Do not spend more than a few minutes on the toilet if you cannot empty your bowel; instead re-visit the toilet at a later time.    Follow up with Dr Carlean Purl as needed.    I appreciate the opportunity to care for you. Silvano Rusk, MD, [facility]

## 2018-10-27 ENCOUNTER — Encounter: Payer: Self-pay | Admitting: Family

## 2018-12-11 ENCOUNTER — Encounter: Payer: Self-pay | Admitting: Family

## 2019-02-02 ENCOUNTER — Telehealth: Payer: Self-pay | Admitting: Internal Medicine

## 2019-02-02 MED ORDER — OMEPRAZOLE 40 MG PO CPDR: 40.0000 mg | DELAYED_RELEASE_CAPSULE | Freq: Every day | ORAL | 1 refills | Status: DC

## 2019-02-02 NOTE — Telephone Encounter (Signed)
I called and told Russell [name] that his omeprazole has been refilled. He said it's working great!

## 2019-02-02 NOTE — Telephone Encounter (Signed)
Patient would like a refill for  omeprazole 40mg  sent to a different pharmacy: [facility]

## 2019-03-10 ENCOUNTER — Encounter: Payer: Self-pay | Admitting: Family

## 2019-03-11 ENCOUNTER — Other Ambulatory Visit: Payer: Self-pay | Admitting: Family

## 2019-03-11 DIAGNOSIS — I714 Abdominal aortic aneurysm, without rupture, unspecified: Secondary | ICD-10-CM

## 2019-03-13 ENCOUNTER — Encounter: Payer: Self-pay | Admitting: Family

## 2019-03-16 ENCOUNTER — Other Ambulatory Visit: Payer: Self-pay | Admitting: Family

## 2019-03-16 ENCOUNTER — Encounter: Payer: Self-pay | Admitting: Family

## 2019-03-16 DIAGNOSIS — Z87891 Personal history of nicotine dependence: Secondary | ICD-10-CM

## 2019-03-16 DIAGNOSIS — Z8249 Family history of ischemic heart disease and other diseases of the circulatory system: Secondary | ICD-10-CM

## 2019-03-20 ENCOUNTER — Other Ambulatory Visit: Payer: Self-pay | Admitting: Family

## 2019-03-20 DIAGNOSIS — Z8249 Family history of ischemic heart disease and other diseases of the circulatory system: Secondary | ICD-10-CM

## 2019-03-20 DIAGNOSIS — Z87891 Personal history of nicotine dependence: Secondary | ICD-10-CM

## 2019-03-31 ENCOUNTER — Other Ambulatory Visit: Payer: Self-pay | Admitting: Family

## 2019-03-31 ENCOUNTER — Encounter: Payer: Self-pay | Admitting: Family

## 2019-03-31 DIAGNOSIS — Z87891 Personal history of nicotine dependence: Secondary | ICD-10-CM

## 2019-03-31 DIAGNOSIS — Z8249 Family history of ischemic heart disease and other diseases of the circulatory system: Secondary | ICD-10-CM

## 2019-04-07 ENCOUNTER — Other Ambulatory Visit: Payer: Self-pay

## 2019-04-07 ENCOUNTER — Ambulatory Visit (HOSPITAL_COMMUNITY)
Admission: RE | Admit: 2019-04-07 | Discharge: 2019-04-07 | Disposition: A | Discharge status: Home / Self Care | Payer: Medicare Other | Source: Ambulatory Visit | Attending: Cardiovascular Disease | Admitting: Cardiovascular Disease

## 2019-04-07 DIAGNOSIS — Z87891 Personal history of nicotine dependence: Secondary | ICD-10-CM

## 2019-04-07 DIAGNOSIS — Z8249 Family history of ischemic heart disease and other diseases of the circulatory system: Secondary | ICD-10-CM | POA: Diagnosis present

## 2019-05-06 ENCOUNTER — Other Ambulatory Visit: Payer: Self-pay | Admitting: Family

## 2019-05-06 ENCOUNTER — Encounter: Payer: Self-pay | Admitting: Family

## 2019-06-16 ENCOUNTER — Other Ambulatory Visit: Payer: Self-pay | Admitting: Family

## 2019-06-16 ENCOUNTER — Encounter: Payer: Self-pay | Admitting: Family

## 2019-06-16 DIAGNOSIS — M720 Palmar fascial fibromatosis [Dupuytren]: Secondary | ICD-10-CM

## 2019-06-19 NOTE — Progress Notes (Addendum)
Subjective:   Russell [name] is a 73 y.o. male who presents for an Initial Medicare Annual Wellness Visit. I connected with patient by a telephone and verified that I am speaking with the correct person using two identifiers. Patient stated full name and DOB. Patient gave permission to continue with telephonic visit. Patient's location was at home and Nurse's location was at [place] office. Participants during this visit included patient and nurse.  Review of Systems   Cardiac Risk Factors include: advanced age (>50men, >64 women);male gender Sleep patterns: feels rested on waking and sleeps 7-8 hours nightly.    Home Safety/Smoke Alarms: Feels safe in home. Smoke alarms in place.  Living environment; residence and Firearm Safety: 2-story house. Lives with wife, no needs for DME, good support system Seat Belt Safety/Bike Helmet: Wears seat belt.    Objective:    There were no vitals filed for this visit. There is no height or weight on file to calculate BMI.  Advanced Directives 06/22/2019 06/13/2018 11/01/2016  Does Patient Have a Medical Advance Directive? Yes Yes No  Type of Paramedic of [place];Living will [place];Living will -  Copy of [place] in Chart? No - copy requested - -    Current Medications (verified) Outpatient Encounter Medications as of 06/22/2019  Medication Sig  . Cyanocobalamin (VITAMIN B-12 PO) Take 1,000 mcg by mouth daily.  . IRON PO Take 50 mg by mouth daily.  Marland Kitchen MAGNESIUM CHLORIDE PO Take 1 tablet by mouth daily.  . Omega-3 Fatty Acids (FISH OIL) 1200 MG CAPS Take 1 capsule by mouth 2 (two) times daily.  Marland Kitchen omeprazole (PRILOSEC) 40 MG capsule Take 1 capsule (40 mg total) by mouth daily.  Marland Kitchen pyridOXINE (VITAMIN B-6) 50 MG tablet Take 50 mg by mouth daily.  . SOOLANTRA 1 % CREA as needed.   . TURMERIC PO Take 500 mg by mouth daily.  . vitamin C (ASCORBIC ACID) 500 MG tablet Take 500 mg by  mouth daily.  . diclofenac sodium (VOLTAREN) 1 % GEL 3 grams to 3 large joints up to 3 times daily   No facility-administered encounter medications on file as of 06/22/2019.     Allergies (verified) Patient has no known allergies.   History: Past Medical History:  Diagnosis Date  . Anemia 2019  . Arthritis   . Bleeding internal hemorrhoids 09/10/2006   Qualifier: Diagnosis of  By: Jonna Munro MD, Roderic Scarce    . 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[place]) 2011  . Cerebellar infarct ([place])   . Cervical spine degeneration   . Constipation   . Hemorrhoids   . Hx of colonic polyp 05/29/2018   05/2018 cecal ssp  . Kidney stone    Remote  . Maxillary sinus polyp   . Prolapsed internal hemorrhoids, grade 3 09/10/2006   Qualifier: Diagnosis of  By: Jonna Munro MD, Roderic Scarce    . Salivary gland enlargement   . Sleep apnea 10 years ago   declined CPAP  . Stroke ([place]) 15-20 years ago   Past Surgical History:  Procedure Laterality Date  . COLONOSCOPY    . HEMORRHOID BANDING    . LUMBAR LAMINECTOMY  03/08/2010   L2-3, cauda equina syndrome  . UPPER GI ENDOSCOPY     Family History  Problem Relation Age of Onset  . Aortic aneurysm Mother   . Heart attack Father   . Aortic aneurysm Sister   . CVA Sister   . Colon cancer Neg Hx   .  Pancreatic cancer Neg Hx   . Rectal cancer Neg Hx   . Stomach cancer Neg Hx    Social History   Socioeconomic History  . Marital status: Married    Spouse name: Not on file  . Number of children: Not on file  . Years of education: Not on file  . Highest education level: Not on file  Occupational History  . Occupation: retired  Scientific laboratory technician  . Financial resource strain: Not hard at all  . Food insecurity    Worry: Never true    Inability: Never true  . Transportation needs    Medical: No    Non-medical: No  Tobacco Use  . Smoking status: Former Smoker    Packs/day: 0.25    Types: Cigarettes    Start date: 10/15/1965    Quit date: 10/16/1974    Years since  quitting: 44.7  . Smokeless tobacco: Never Used  Substance and Sexual Activity  . Alcohol use: Not Currently    Comment: rarely Jerrica Thorman  . Drug use: No  . Sexual activity: Yes  Lifestyle  . Physical activity    Days per week: 3 days    Minutes per session: 40 min  . Stress: Not at all  Relationships  . Social connections    Talks on phone: More than three times a week    Gets together: More than three times a week    Attends religious service: 1 to 4 times per year    Active member of club or organization: Yes    Attends meetings of clubs or organizations: 1 to 4 times per year    Relationship status: Married  Other Topics Concern  . 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 N  Vision? N  Difficulty concentrating or making decisions? N  Walking or climbing stairs? N  Dressing or bathing? N  Doing errands, shopping? N  Preparing Food and eating ? N  Using the Toilet? N  In the past six months, have you accidently leaked urine? N  Do you have problems with loss of bowel control? N  Managing your Medications? N  Managing your Finances? N  Housekeeping or managing your Housekeeping? N  Some recent data might be hidden     Immunizations and Health Maintenance Immunization History  Administered Date(s) Administered  . Influenza Whole 04/28/2008  . Influenza-Unspecified 05/18/2014  . Pneumococcal-Unspecified 10/09/2013  . Td 04/28/2008  . Zoster Recombinat (Shingrix) 12/21/2014   Health Maintenance Due  Topic Date Due  . Hepatitis C Screening  Jun 14, 1946  .  PNA vac Low Risk Adult (2 of 2 - PCV13) 10/10/2014  . TETANUS/TDAP  04/28/2018  . INFLUENZA VACCINE  02/14/2019    Patient Care Team: Marrian Salvage, FNP as PCP - General (Internal Medicine)  Indicate any recent Medical Services you may have received from other than [facility] providers in the past year (date may be approximate).    Assessment:   This is a routine wellness examination for Kenry. Physical assessment deferred to  PCP.  Hearing/Vision screen  Hearing Screening   125Hz  250Hz  500Hz  1000Hz  2000Hz  3000Hz  4000Hz  6000Hz  8000Hz   Right ear:           Left ear:           Comments: Able to hear conversational tones w/o difficulty. No issues reported.    Vision Screening Comments: appointment yearly   Dietary issues and exercise activities discussed: Current Exercise Habits: Home exercise routine, Type of exercise: walking, Time (Minutes): 30, Frequency (Times/Week): 5, Weekly Exercise (Minutes/Week): 150, Intensity: Mild, Exercise limited by: orthopedic condition(s) Diet (meal preparation, eat out, water intake, caffeinated beverages, dairy products, fruits and vegetables): in general, a "healthy" diet  , well balanced. eats a variety of fruits and vegetables daily, limits salt, fat/cholesterol, sugar,carbohydrates,caffeine.   Encouraged patient to increase daily water and healthy fluid intake.   Goals    . Patient Stated     Maintain my current health status. Watch my weight and stay active.      Depression Screen PHQ 2/9 Scores 06/22/2019  PHQ - 2 Score 1  PHQ- 9 Score 1    Fall Risk Fall Risk  06/22/2019 12/20/2017  Falls in the past year? 0 No  Number falls in past yr: 0 -  Injury with Fall? 0 -   Cognitive Function:       Ad8 score reviewed for issues:  Issues making decisions: no  Less interest in hobbies / activities: no  Repeats questions, stories (family complaining): no  Trouble using ordinary gadgets (microwave, computer, phone):no  Forgets  the month or year: no  Mismanaging finances: no  Remembering appts: no  Daily problems with thinking and/or memory: no Ad8 score is= 0  Screening Tests Health Maintenance  Topic Date Due  . Hepatitis C Screening  11/23/1945  . PNA vac Low Risk Adult (2 of 2 - PCV13) 10/10/2014  . TETANUS/TDAP  04/28/2018  . INFLUENZA VACCINE  02/14/2019  . Fecal DNA (Cologuard)  03/07/2021      Plan:    Reviewed health maintenance screenings with patient today and relevant education, vaccines, and/or referrals were provided.   Continue to eat heart healthy diet (full of fruits, vegetables, whole grains, lean protein, water--limit salt, fat, and sugar intake) and increase physical activity as tolerated.  I have personally reviewed and noted the following in the patient's chart:   . Medical and social history . Use of alcohol, tobacco or illicit drugs  . Current medications and supplements . Functional ability and status . Nutritional status . Physical activity . Advanced directives . List of other physicians . Screenings to include cognitive, depression, and falls . Referrals and appointments  In addition, I have reviewed and discussed with patient certain preventive protocols, quality metrics, and best practice recommendations. A written personalized care plan for preventive services as well as general preventive health recommendations were provided to patient.     Michiel Cowboy, RN   06/22/2019   Medical screening examination/treatment/procedure(s) were performed by non-physician practitioner and as supervising provider I was immediately available for consultation/collaboration.  I agree with above. Marrian Salvage, FNP

## 2019-06-22 ENCOUNTER — Ambulatory Visit (INDEPENDENT_AMBULATORY_CARE_PROVIDER_SITE_OTHER): Payer: Medicare Other | Admitting: *Deleted

## 2019-06-22 DIAGNOSIS — Z0000 Encounter for general adult medical examination without abnormal findings: Secondary | ICD-10-CM | POA: Diagnosis not present

## 2019-07-11 ENCOUNTER — Encounter: Payer: Self-pay | Admitting: Family

## 2019-07-31 ENCOUNTER — Encounter: Payer: Self-pay | Admitting: Family

## 2019-08-11 ENCOUNTER — Encounter: Payer: Self-pay | Admitting: Family

## 2019-08-11 ENCOUNTER — Other Ambulatory Visit: Payer: Self-pay | Admitting: Family

## 2019-08-11 DIAGNOSIS — M25579 Pain in unspecified ankle and joints of unspecified foot: Secondary | ICD-10-CM

## 2019-08-14 ENCOUNTER — Ambulatory Visit: Payer: Medicare PPO | Admitting: Podiatry

## 2019-08-14 ENCOUNTER — Ambulatory Visit (INDEPENDENT_AMBULATORY_CARE_PROVIDER_SITE_OTHER): Payer: Medicare PPO

## 2019-08-14 ENCOUNTER — Other Ambulatory Visit: Payer: Self-pay

## 2019-08-14 ENCOUNTER — Encounter: Payer: Self-pay | Admitting: Podiatry

## 2019-08-14 ENCOUNTER — Other Ambulatory Visit: Payer: Self-pay | Admitting: Podiatry

## 2019-08-14 VITALS — BP 131/81 | HR 51 | Temp 97.6°F

## 2019-08-14 DIAGNOSIS — G629 Polyneuropathy, unspecified: Secondary | ICD-10-CM

## 2019-08-14 DIAGNOSIS — M79671 Pain in right foot: Secondary | ICD-10-CM | POA: Diagnosis not present

## 2019-08-14 DIAGNOSIS — M76821 Posterior tibial tendinitis, right leg: Secondary | ICD-10-CM

## 2019-08-14 DIAGNOSIS — M79672 Pain in left foot: Secondary | ICD-10-CM

## 2019-08-14 DIAGNOSIS — M779 Enthesopathy, unspecified: Secondary | ICD-10-CM

## 2019-08-14 MED ORDER — OMEPRAZOLE 40 MG PO CPDR: 40.0000 mg | DELAYED_RELEASE_CAPSULE | Freq: Every day | ORAL | 1 refills | Status: DC

## 2019-08-14 NOTE — Progress Notes (Signed)
Subjective:   Patient ID: [name], male   DOB: 74 y.o.   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  Also complaining of pain in the right ankle pain in the right posterior tibial tendon and midfoot secondary to the collapse of the arch.  Patient does not smoke likes to be active   Review of Systems  All other systems reviewed and are negative.       Objective:  Physical Exam Vitals and nursing note reviewed.  Constitutional:      Appearance: He is well-developed.  Pulmonary:     Effort: Pulmonary effort is normal.  Musculoskeletal:        General: Normal range of motion.  Skin:    General: Skin is warm.  Neurological:     Mental Status: He is alert.     Neurovascular status found to be intact with muscle strength found to be adequate.  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  Due to the severe nature of the posterior tib rupture and the collapsibility of the arch I recommended AFO bracing right to try to provide for support and patient is evaluated by ped orthotist to completely agrees with me and is casted for AFO bracing.  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

## 2019-08-14 NOTE — Telephone Encounter (Signed)
Omeprazole refilled.

## 2019-08-19 ENCOUNTER — Telehealth: Payer: Self-pay | Admitting: Podiatry

## 2019-08-19 NOTE — Telephone Encounter (Signed)
Called pt with benefit information for the brace that Dr Paulla Dolly wanted pt to get. HE will have a 20% coinsurance. Pt is aware and wants to proceed.

## 2019-09-07 ENCOUNTER — Encounter: Payer: Self-pay | Admitting: Podiatry

## 2019-09-07 ENCOUNTER — Telehealth: Payer: Self-pay | Admitting: Podiatry

## 2019-09-07 ENCOUNTER — Other Ambulatory Visit: Payer: Self-pay

## 2019-09-07 ENCOUNTER — Ambulatory Visit (INDEPENDENT_AMBULATORY_CARE_PROVIDER_SITE_OTHER): Payer: Medicare PPO | Admitting: Orthotics

## 2019-09-07 DIAGNOSIS — M79672 Pain in left foot: Secondary | ICD-10-CM

## 2019-09-07 DIAGNOSIS — M779 Enthesopathy, unspecified: Secondary | ICD-10-CM

## 2019-09-07 DIAGNOSIS — M79671 Pain in right foot: Secondary | ICD-10-CM

## 2019-09-07 DIAGNOSIS — M76821 Posterior tibial tendinitis, right leg: Secondary | ICD-10-CM

## 2019-09-07 DIAGNOSIS — G629 Polyneuropathy, unspecified: Secondary | ICD-10-CM

## 2019-09-07 NOTE — Progress Notes (Signed)
Patient came in today to pick up brace (Betha cast); the brace fit well, but patient was concerned that it still caused foot to collapse medially.  I told him to wear for a couple of weeks to see if the brace adequately helped with discomfort/pain, and if we need to make adjustments we would.    Patient was also not happy that I was late seeing him as I had other patients ahead of him and got delayed.

## 2019-09-07 NOTE — Telephone Encounter (Signed)
Pt called and left a message stating the brace he picked up was not what he was told it would be or function like he was told. It does not hold his arch up like he was told it would and does not fit in the shoes he would be wearing it in the most. He wants to return it next Monday when he is back in [place].

## 2019-09-07 NOTE — Telephone Encounter (Signed)
Talked with patient after he called Dawn and wanted to return the brace due to ill fitting.  I told him I would be happy to re-cast and make new measurements; however, he said he was not prepared for a brace that was so "bulky" and not being able to fit in most of his current shoes.  Furthermore he didn't fill it adequately addressed his medial column collapse.  Again, I told him he could return it and we could make appropriate modification.s   I further advised that with his condition (PTTD late stages) that rigid bracing would ensure a best possible outcome.  I told of of the proressive nature of the pathology and it would get better sans corrective surgery.  I offered him to google PTTD and acquired adult flatfoot. Also youtube [place] brace interventions.   He would do this and then make the decsion to return the brace or get recast.

## 2019-09-14 ENCOUNTER — Telehealth: Payer: Self-pay | Admitting: Podiatry

## 2019-09-14 NOTE — Telephone Encounter (Signed)
Pt showed up at the front desk and returned the brace.

## 2019-09-15 NOTE — Telephone Encounter (Signed)
Ok...he just wasn't going to have anything to do with the brace;  went more on the advice of his chiropractor.  I would still allow the insurance charges to go through since it is a custom device; however, that is a Jocelyn Lamer call.

## 2019-09-25 ENCOUNTER — Encounter: Payer: Self-pay | Admitting: Family

## 2019-09-25 ENCOUNTER — Encounter: Payer: Self-pay | Admitting: Podiatry

## 2019-09-28 ENCOUNTER — Encounter: Payer: Self-pay | Admitting: Family

## 2019-09-28 ENCOUNTER — Other Ambulatory Visit: Payer: Self-pay

## 2019-09-28 ENCOUNTER — Ambulatory Visit: Payer: Medicare PPO | Admitting: Family

## 2019-09-28 VITALS — BP 128/78 | HR 72 | Temp 98.0°F | Ht 74.0 in | Wt 194.0 lb

## 2019-09-28 DIAGNOSIS — M79641 Pain in right hand: Secondary | ICD-10-CM

## 2019-09-28 DIAGNOSIS — R5383 Other fatigue: Secondary | ICD-10-CM | POA: Diagnosis not present

## 2019-09-28 DIAGNOSIS — M79642 Pain in left hand: Secondary | ICD-10-CM

## 2019-09-28 DIAGNOSIS — R350 Frequency of micturition: Secondary | ICD-10-CM | POA: Diagnosis not present

## 2019-09-28 LAB — COMPREHENSIVE METABOLIC PANEL
ALT: 16 U/L (ref 0–53)
AST: 21 U/L (ref 0–37)
Albumin: 4.2 g/dL (ref 3.5–5.2)
Alkaline Phosphatase: 44 U/L (ref 39–117)
BUN: 16 mg/dL (ref 6–23)
CO2: 30 mEq/L (ref 19–32)
Calcium: 9.2 mg/dL (ref 8.4–10.5)
Chloride: 102 mEq/L (ref 96–112)
Creatinine, Ser: 0.69 mg/dL (ref 0.40–1.50)
GFR: 112.81 mL/min (ref >60.00)
Glucose, Bld: 78 mg/dL (ref 70–99)
Potassium: 3.9 mEq/L (ref 3.5–5.1)
Sodium: 139 mEq/L (ref 135–145)
Total Bilirubin: 0.6 mg/dL (ref 0.2–1.2)
Total Protein: 6.9 g/dL (ref 6.0–8.3)

## 2019-09-28 LAB — CBC WITH DIFFERENTIAL/PLATELET
Basophils Absolute: 0 10*3/uL (ref 0.0–0.1)
Basophils Relative: 0.6 % (ref 0.0–3.0)
Eosinophils Absolute: 0.1 10*3/uL (ref 0.0–0.7)
Eosinophils Relative: 1.9 % (ref 0.0–5.0)
HCT: 39.3 % (ref 39.0–52.0)
Hemoglobin: 12.7 g/dL — ABNORMAL LOW (ref 13.0–17.0)
Lymphocytes Relative: 27.6 % (ref 12.0–46.0)
Lymphs Abs: 1.6 10*3/uL (ref 0.7–4.0)
MCHC: 32.4 g/dL (ref 30.0–36.0)
MCV: 79.2 fl (ref 78.0–100.0)
Monocytes Absolute: 0.5 10*3/uL (ref 0.1–1.0)
Monocytes Relative: 9.2 % (ref 3.0–12.0)
Neutro Abs: 3.6 10*3/uL (ref 1.4–7.7)
Neutrophils Relative %: 60.7 % (ref 43.0–77.0)
Platelets: 207 10*3/uL (ref 150.0–400.0)
RBC: 4.96 Mil/uL (ref 4.22–5.81)
RDW: 14.4 % (ref 11.5–15.5)
WBC: 5.9 10*3/uL (ref 4.0–10.5)

## 2019-09-28 LAB — URIC ACID: Uric Acid, Serum: 4.4 mg/dL (ref 4.0–7.8)

## 2019-09-28 LAB — TSH: TSH: 1.64 u[IU]/mL (ref 0.35–4.50)

## 2019-09-28 LAB — PSA, MEDICARE: PSA: 1.95 ng/ml (ref 0.10–4.00)

## 2019-09-28 MED ORDER — TAMSULOSIN HCL 0.4 MG PO CAPS: 0.4000 mg | ORAL_CAPSULE | Freq: Every day | ORAL | 3 refills | Status: DC

## 2019-09-28 NOTE — Progress Notes (Signed)
Russell [name] is a 74 y.o. male with the following history as recorded in EpicCare:  Patient Active Problem List   Diagnosis Date Noted  . DDD (degenerative disc disease), cervical 06/11/2018  . DDD (degenerative disc disease), lumbar 06/11/2018  . History of kidney stones 06/11/2018  . 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[place]) 06/11/2018  . Primary osteoarthritis of left knee 06/11/2018  . Primary osteoarthritis of both feet 06/11/2018  . Plantar fasciitis 06/11/2018  . Hx of colonic polyp 05/29/2018  . Left lumbar radiculopathy 01/07/2018  . Diastolic dysfunction 44/96/7591  . Psoriasis 10/15/2016  . DEGENERATIVE JOINT DISEASE 02/07/2009  . HEADACHE 02/07/2009  . GERD 10/02/2007  . HYPERTENSION, BENIGN ESSENTIAL 10/08/2006  . 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 ERECTILE DYSFUNCTION 09/10/2006  . Prolapsed internal hemorrhoids, grade 2 and 3 09/10/2006  . PLANTAR FASCIITIS, BILATERAL 09/10/2006  . SLEEP APNEA 09/10/2006  . MALAISE AND FATIGUE 09/10/2006    Current Outpatient Medications  Medication Sig Dispense Refill  . Cyanocobalamin (VITAMIN B-12 PO) Take 1,000 mcg by mouth daily.    . IRON PO Take 50 mg by mouth daily.    Marland Kitchen MAGNESIUM CHLORIDE PO Take 1 tablet by mouth daily.    . Omega-3 Fatty Acids (FISH OIL) 1200 MG CAPS Take 1 capsule by mouth 2 (two) times daily.    Marland Kitchen omeprazole (PRILOSEC) 40 MG capsule Take 1 capsule (40 mg total) by mouth daily. 90 capsule 1  . pyridOXINE (VITAMIN B-6) 50 MG tablet Take 50 mg by mouth daily.    . SOOLANTRA 1 % CREA as needed.   5  . tobramycin-dexamethasone (TOBRADEX) ophthalmic solution IGT IN THE RIGHT EYE FOUR TIMES DAILY    . TURMERIC PO Take 500 mg by mouth daily.    . vitamin C (ASCORBIC ACID) 500 MG tablet Take 500 mg by mouth daily.    . tamsulosin (FLOMAX) 0.4 MG CAPS capsule Take 1 capsule (0.4 mg total) by mouth daily. 30 capsule 3   No current facility-administered medications for this  visit.    Allergies: Patient has no known allergies.  Past Medical History:  Diagnosis Date  . Anemia 2019  . Arthritis   . Bleeding internal hemorrhoids 09/10/2006   Qualifier: Diagnosis of  By: Jonna Munro MD, Roderic Scarce    . 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[place]) 2011  . 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 Cervical spine degeneration   . Constipation   . Hemorrhoids   . Hx of colonic polyp 05/29/2018   05/2018 cecal ssp  . Kidney stone    Remote  . Maxillary sinus polyp   . Prolapsed internal hemorrhoids, grade 3 09/10/2006   Qualifier: Diagnosis of  By: Jonna Munro MD, Roderic Scarce    . Salivary gland enlargement   . Sleep apnea 10 years ago   declined CPAP  . Stroke ([place]) 15-20 years ago    Past Surgical History:  Procedure Laterality Date  . COLONOSCOPY    . HEMORRHOID BANDING    . LUMBAR LAMINECTOMY  03/08/2010   L2-3, cauda equina syndrome  . UPPER GI ENDOSCOPY      Family History  Problem Relation Age of Onset  . Aortic aneurysm Mother   . Heart attack Father   . Aortic aneurysm Sister   . CVA Sister   . Colon cancer Neg Hx   . Pancreatic cancer Neg Hx   . Rectal cancer Neg Hx   . Stomach cancer Neg Hx     Social History  Tobacco Use  . Smoking status: Former Smoker    Packs/day: 0.25    Types: Cigarettes    Start date: 10/15/1965    Quit date: 10/16/1974    Years since quitting: 44.9  . Smokeless tobacco: Never Used  Substance Use Topics  . Alcohol use: Not Currently    Comment: rarely wine    Subjective:  Presents with numerous concerns:  1) Intermittent episodes of feeling light headed "on and off" for 2 weeks; feels the sensation that "head is lifting"/ denies actually feeling dizzy/ room is not spinning; still able to work during the episodes; no sensation of irregular heart beat or fast heartbeat; no history of seasonal allergies;  2) History of RA- has not seen rheumatologist since 2019; is not currently on any medication; requesting to get re-checked for gout today;   3) Increased problems/ concerns about his prostate; feels like his stream is "slow" especially at night;   Objective:  Vitals:   09/28/19 1328  BP: 128/78  Pulse: 72  Temp: 98 F (36.7 C)  TempSrc: Oral  SpO2: 99%  Weight: 194 lb (88 kg)  Height: _0  (1.88 m)    General: Well developed, well nourished, in no acute distress  Skin : Warm and dry.  Head: Normocephalic and atraumatic  Eyes: Sclera and conjunctiva clear; pupils round and reactive to light; extraocular movements intact  Ears: External normal; canals clear; tympanic membranes normal  Oropharynx: Pink, supple. No suspicious lesions  Neck: Supple without thyromegaly, adenopathy; no carotid bruits noted Lungs: Respirations unlabored; clear to auscultation bilaterally without wheeze, rales, rhonchi  CVS exam: normal rate and regular rhythm.  Musculoskeletal: No deformities; marked swelling in both hands Extremities: No edema, cyanosis, clubbing  Vessels: Symmetric bilaterally  Neurologic: Alert and oriented; speech intact; face symmetrical; moves all extremities well; CNII-XII intact without focal deficit   Assessment:  1. Other fatigue   2. Pain in both hands   3. Urinary frequency     Plan:  1. Update labs today; assuming labs are normal, will check head CT and carotid ultrasounds; follow-up to be determined; 2. Check uric acid per patient request; he is encouraged to follow-up with his rheumatologist to discuss treatment options. 3. Check PSA today; trial of Flomax;   This visit occurred during the SARS-CoV-2 public health emergency.  Safety protocols were in place, including screening questions prior to the visit, additional usage of staff PPE, and extensive cleaning of exam room while observing appropriate contact time as indicated for disinfecting solutions.      No follow-ups on file.  Orders Placed This Encounter  Procedures  . CBC with Differential/Platelet  . Comp Met (CMET)  . TSH  . Uric acid  .  HgB A1c  . PSA, Medicare    Requested Prescriptions   Signed Prescriptions Disp Refills  . tamsulosin (FLOMAX) 0.4 MG CAPS capsule 30 capsule 3    Sig: Take 1 capsule (0.4 mg total) by mouth daily.

## 2019-09-28 NOTE — Patient Instructions (Addendum)
Please call Dr. Patrecia Pour to schedule a follow up on your arthritis; [phone]  Benign Prostatic Hyperplasia  Benign prostatic hyperplasia (BPH) is an enlarged prostate gland that is caused by the normal aging process and not by cancer. The prostate is a walnut-sized gland that is involved in the production of semen. It is located in front of the rectum and below the bladder. The bladder stores urine and the urethra is the tube that carries the urine out of the body. The prostate may get bigger as a man gets older. An enlarged prostate can press on the urethra. This can make it harder to pass urine. The build-up of urine in the bladder can cause infection. Back pressure and infection may progress to bladder damage and kidney (renal) failure. What are the causes? This condition is part of a normal aging process. However, not all men develop problems from this condition. If the prostate enlarges away from the urethra, urine flow will not be blocked. If it enlarges toward the urethra and compresses it, there will be problems passing urine. What increases the risk? This condition is more likely to develop in men over the age of 30 years. What are the signs or symptoms? Symptoms of this condition include:  Getting up often during the night to urinate.  Needing to urinate frequently during the day.  Difficulty starting urine flow.  Decrease in size and strength of your urine stream.  Leaking (dribbling) after urinating.  Inability to pass urine. This needs immediate treatment.  Inability to completely empty your bladder.  Pain when you pass urine. This is more common if there is also an infection.  Urinary tract infection (UTI). How is this diagnosed? This condition is diagnosed based on your medical history, a physical exam, and your symptoms. Tests will also be done, such as:  A post-void bladder scan. This measures any amount of urine that may remain in your bladder after you finish  urinating.  A digital rectal exam. In a rectal exam, your health care provider checks your prostate by putting a lubricated, gloved finger into your rectum to feel the back of your prostate gland. This exam detects the size of your gland and any abnormal lumps or growths.  An exam of your urine (urinalysis).  A prostate specific antigen (PSA) screening. This is a blood test used to screen for prostate cancer.  An ultrasound. This test uses sound waves to electronically produce a picture of your prostate gland. Your health care provider may refer you to a specialist in kidney and prostate diseases (urologist). How is this treated? Once symptoms begin, your health care provider will monitor your condition (active surveillance or watchful waiting). Treatment for this condition will depend on the severity of your condition. Treatment may include:  Observation and yearly exams. This may be the only treatment needed if your condition and symptoms are mild.  Medicines to relieve your symptoms, including: ? Medicines to shrink the prostate. ? Medicines to relax the muscle of the prostate.  Surgery in severe cases. Surgery may include: ? Prostatectomy. In this procedure, the prostate tissue is removed completely through an open incision or with a laparoscope or robotics. ? Transurethral resection of the prostate (TURP). In this procedure, a tool is inserted through the opening at the tip of the penis (urethra). It is used to cut away tissue of the inner core of the prostate. The pieces are removed through the same opening of the penis. This removes the blockage. ? Transurethral  incision (TUIP). In this procedure, small cuts are made in the prostate. This lessens the prostate's pressure on the urethra. ? Transurethral microwave thermotherapy (TUMT). This procedure uses microwaves to create heat. The heat destroys and removes a small amount of prostate tissue. ? Transurethral needle ablation (TUNA).  This procedure uses radio frequencies to destroy and remove a small amount of prostate tissue. ? Interstitial laser coagulation ([place]). This procedure uses a laser to destroy and remove a small amount of prostate tissue. ? Transurethral electrovaporization (TUVP). This procedure uses electrodes to destroy and remove a small amount of prostate tissue. ? Prostatic urethral lift. This procedure inserts an implant to push the lobes of the prostate away from the urethra. Follow these instructions at home:  Take over-the-counter and prescription medicines only as told by your health care provider.  Monitor your symptoms for any changes. Contact your health care provider with any changes.  Avoid drinking large amounts of liquid before going to bed or out in public.  Avoid or reduce how much caffeine or alcohol you drink.  Give yourself time when you urinate.  Keep all follow-up visits as told by your health care provider. This is important. Contact a health care provider if:  You have unexplained back pain.  Your symptoms do not get better with treatment.  You develop side effects from the medicine you are taking.  Your urine becomes very dark or has a bad smell.  Your lower abdomen becomes distended and you have trouble passing your urine. Get help right away if:  You have a fever or chills.  You suddenly cannot urinate.  You feel lightheaded, or very dizzy, or you faint.  There are large amounts of blood or clots in the urine.  Your urinary problems become hard to manage.  You develop moderate to severe low back or flank pain. The flank is the side of your body between the ribs and the hip. These symptoms may represent a serious problem that is an emergency. Do not wait to see if the symptoms will go away. Get medical help right away. Call your local emergency services (911 in the [place] Do not drive yourself to the hospital. Summary  Benign prostatic hyperplasia (BPH) is an  enlarged prostate that is caused by the normal aging process and not by cancer.  An enlarged prostate can press on the urethra. This can make it hard to pass urine.  This condition is part of a normal aging process and is more likely to develop in men over the age of 64 years.  Get help right away if you suddenly cannot urinate. This information is not intended to replace advice given to you by your health care provider. Make sure you discuss any questions you have with your health care provider. Document Revised: 05/27/2018 Document Reviewed: 08/06/2016 Elsevier Patient Education  2020 [employer].

## 2019-09-29 ENCOUNTER — Other Ambulatory Visit: Payer: Self-pay | Admitting: Family

## 2019-09-29 ENCOUNTER — Other Ambulatory Visit: Payer: Medicare PPO

## 2019-09-29 DIAGNOSIS — R29818 Other symptoms and signs involving the nervous system: Secondary | ICD-10-CM

## 2019-09-29 DIAGNOSIS — H81399 Other peripheral vertigo, unspecified ear: Secondary | ICD-10-CM

## 2019-09-29 DIAGNOSIS — R350 Frequency of micturition: Secondary | ICD-10-CM

## 2019-09-30 ENCOUNTER — Encounter: Payer: Self-pay | Admitting: Family

## 2019-09-30 LAB — HEMOGLOBIN A1C
Hgb A1c MFr Bld: 5.6 % of total Hgb (ref <5.7)
Mean Plasma Glucose: 114 (calc)
eAG (mmol/L): 6.3 (calc)

## 2019-10-08 ENCOUNTER — Other Ambulatory Visit: Payer: Self-pay

## 2019-10-08 ENCOUNTER — Ambulatory Visit (HOSPITAL_COMMUNITY)
Admission: RE | Admit: 2019-10-08 | Discharge: 2019-10-08 | Disposition: A | Discharge status: Home / Self Care | Payer: Medicare PPO | Source: Ambulatory Visit | Attending: Cardiovascular Disease | Admitting: Cardiovascular Disease

## 2019-10-08 DIAGNOSIS — R29818 Other symptoms and signs involving the nervous system: Secondary | ICD-10-CM | POA: Diagnosis not present

## 2019-10-08 DIAGNOSIS — H81399 Other peripheral vertigo, unspecified ear: Secondary | ICD-10-CM | POA: Diagnosis present

## 2019-10-08 DIAGNOSIS — R42 Dizziness and giddiness: Secondary | ICD-10-CM | POA: Diagnosis not present

## 2019-10-15 ENCOUNTER — Ambulatory Visit
Admission: RE | Admit: 2019-10-15 | Discharge: 2019-10-15 | Disposition: A | Discharge status: Home / Self Care | Payer: Medicare PPO | Source: Ambulatory Visit | Attending: Family | Admitting: Family

## 2019-10-15 DIAGNOSIS — R29818 Other symptoms and signs involving the nervous system: Secondary | ICD-10-CM

## 2019-10-15 DIAGNOSIS — H81399 Other peripheral vertigo, unspecified ear: Secondary | ICD-10-CM

## 2019-10-20 ENCOUNTER — Other Ambulatory Visit: Payer: Self-pay | Admitting: Family

## 2019-10-20 ENCOUNTER — Encounter: Payer: Self-pay | Admitting: Family

## 2019-10-20 DIAGNOSIS — R42 Dizziness and giddiness: Secondary | ICD-10-CM

## 2019-10-20 DIAGNOSIS — I6381 Other cerebral infarction due to occlusion or stenosis of small artery: Secondary | ICD-10-CM

## 2019-10-23 ENCOUNTER — Encounter: Payer: Self-pay | Admitting: Neurology

## 2019-12-23 NOTE — Progress Notes (Signed)
NEUROLOGY CONSULTATION NOTE  Russell [name] MRN: [id number] DOB: Mar 20, 1946  Referring provider: Marrian Salvage, [place] Primary care provider: Marrian Salvage, FNP  Reason for consult:  Dizziness, lacunar infarcts  HISTORY OF PRESENT ILLNESS: Russell [name] is a 74 year old right+-handed Caucasian male with rheumatoid arthritis and history of cauda equina syndrome who presents for dizziness and CT brain findings as well as unsteady gait  History supplemented by referring provider's notes.  He had dizzy spells off and on for about a month in March, described as positional spinning.  CT head without contrast on 10/15/2019 personally reviewed demonstrated two chronic lacunar infarcts in the right cerebellar hemisphere.  He reports that he had brain imaging back in 2006 or 2007 which reportedly showed an old stroke.  Dizziness subsequently resolved however he is concerned about his balance.  In 2011, he developed severe back pain and lower extremity weakness after trying to pull a post out of the ground.  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  He underwent surgery and wasn't able to walk normally for about 2 years.  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  Since the injury, he has been impotent.  He has rheumatoid arthritis with significant degenerative spine disease of the cervical and lumbar spine.  About a year ago, he started having increased difficulty with walking.  He feels more unsteady.  When he is sitting in a chair or sitting up in bed, he develops a near-burning pain in the 2nd and 3rd toes of his left leg, which is only relieved by turning to the right or standing up and walking.     PAST MEDICAL HISTORY: Past Medical History:  Diagnosis Date  . Anemia 2019  . Arthritis   . Bleeding internal hemorrhoids 09/10/2006   Qualifier: Diagnosis of  By: Jonna Munro MD, Roderic Scarce    . Cauda  equina syndrome ([place]) 2011  . 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 Cervical spine degeneration   . Constipation   . Hemorrhoids   . Hx of colonic polyp 05/29/2018   05/2018 cecal ssp  . Kidney stone    Remote  . Maxillary sinus polyp   . Prolapsed internal hemorrhoids, grade 3 09/10/2006   Qualifier: Diagnosis of  By: Jonna Munro MD, Roderic Scarce    . Salivary gland enlargement   . Sleep apnea 10 years ago   declined CPAP  . Stroke ([place]) 15-20 years ago    PAST SURGICAL HISTORY: Past Surgical History:  Procedure Laterality Date  . COLONOSCOPY    . HEMORRHOID BANDING    . LUMBAR LAMINECTOMY  03/08/2010   L2-3, cauda equina syndrome  . UPPER GI ENDOSCOPY      MEDICATIONS: Current Outpatient Medications on File Prior to Visit  Medication Sig Dispense Refill  . Cyanocobalamin (VITAMIN B-12 PO) Take 1,000 mcg by mouth daily.    . IRON PO Take 50 mg by mouth daily.    Marland Kitchen MAGNESIUM CHLORIDE PO Take 1 tablet by mouth daily.    . Omega-3 Fatty Acids (FISH OIL) 1200 MG CAPS Take 1 capsule by mouth 2 (two) times daily.    Marland Kitchen omeprazole (PRILOSEC) 40 MG capsule Take 1 capsule (40 mg total) by mouth daily. 90 capsule 1  . pyridOXINE (VITAMIN B-6) 50 MG tablet Take 50 mg by mouth daily.    . SOOLANTRA 1 % CREA as needed.   5  . tamsulosin (FLOMAX)  0.4 MG CAPS capsule Take 1 capsule (0.4 mg total) by mouth daily. 30 capsule 3  . tobramycin-dexamethasone (TOBRADEX) ophthalmic solution IGT IN THE RIGHT EYE FOUR TIMES DAILY    . TURMERIC PO Take 500 mg by mouth daily.    . vitamin C (ASCORBIC ACID) 500 MG tablet Take 500 mg by mouth daily.     No current facility-administered medications on file prior to visit.    ALLERGIES: No Known Allergies  FAMILY HISTORY: Family History  Problem Relation Age of Onset  . Aortic aneurysm Mother   . Heart attack Father   . Aortic aneurysm Sister   . CVA Sister   . Colon cancer Neg Hx   . Pancreatic cancer Neg Hx   . Rectal cancer Neg Hx   . Stomach cancer  Neg Hx    SOCIAL HISTORY: Social History   Socioeconomic History  . Marital status: Married    Spouse name: Not on file  . Number of children: Not on file  . Years of education: Not on file  . Highest education level: Not on file  Occupational History  . Occupation: retired  Tobacco Use  . Smoking status: Former Smoker    Packs/day: 0.25    Types: Cigarettes    Start date: 10/15/1965    Quit date: 10/16/1974    Years since quitting: 45.2  . Smokeless tobacco: Never Used  Substance and Sexual Activity  . Alcohol use: Not Currently    Comment: rarely wine  . Drug use: No  . Sexual activity: Yes  Other Topics Concern  . 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 Difficulty of Paying Living Expenses: Not hard at all  Food Insecurity: No Food Insecurity  . Worried About Charity fundraiser in the Last Year: Never true  . Ran Out of Food in the Last Year: Never true  Transportation Needs: No Transportation Needs  . Lack of Transportation (Medical): No  . Lack of Transportation (Non-Medical): No  Physical Activity: Insufficiently Active  . Days of Exercise per Week: 3 days  . Minutes of Exercise per Session: 40 min  Stress: No Stress Concern Present  . Feeling of Stress : Not at all  Social Connections: Not Isolated  . Frequency of Communication with Friends and Family: More than three times a week  . Frequency of Social Gatherings with Friends and Family: More than three times a week  . Attends Religious Services: 1 to 4 times per year  . Active Member of Clubs or Organizations: Yes  . Attends Archivist Meetings: 1 to 4 times per year  . Marital Status: Married  Arboriculturist Violence: Not At Risk  . Fear of Current or Ex-Partner: No  . Emotionally Abused: No  . Physically Abused: No  . Sexually Abused: No    PHYSICAL EXAM: Blood pressure 132/88, pulse 85, height 6\' 2"  (1.88 m), weight 187 lb (84.8 kg), SpO2 98 %. General: No acute distress.  Patient appears well-groomed.   Head:  Normocephalic/atraumatic Eyes:  fundi examined but not visualized Neck: supple, no paraspinal tenderness, full range  of motion Back: No paraspinal tenderness Heart: regular rate and rhythm Lungs: Clear to auscultation bilaterally. Vascular: No carotid bruits. Neurological Exam: Mental status: alert and oriented to person, place, and time, recent and remote memory intact, fund of knowledge intact, attention and concentration intact, speech fluent and not dysarthric, language intact. Cranial nerves: CN I: not tested CN II: pupils equal, round and reactive to light, visual fields intact CN III, IV, VI:  full range of motion, no nystagmus, no ptosis CN V: facial sensation intact CN VII: upper and lower face symmetric CN VIII: hearing intact CN IX, X: gag intact, uvula midline CN XI: sternocleidomastoid and trapezius muscles intact CN XII: tongue midline Bulk & Tone: left greater than right atrophy of first dorsal interosseus, no fasciculations. Motor:  5-/5 hand grip, otherwise, 5/5 throughout Sensation:  Hyperesthesia of toes to pinprick sensation; reduced vibration sensation in toes. Deep Tendon Reflexes:  2+ upper extremities, absent lower extremities, toes downgoing.   Finger to nose testing:  Without dysmetria.   Heel to shin:  Without dysmetria.   Gait:  Wide-based gait, slight limp.  Able to turn.  Unable to tandem walk.  Romberg with sway.  IMPRESSION: 1.  Worsening gait in male with degenerative spine disease of lumbar region with radiculopathy. 2.  Cerebrovascular disease with evidence of remote cerebellar infarcts, which may have been there for at least 15  years (as per patient, remote brain imaging reportedly showed a stroke).  Cannot say if symptomatic or related to dizziness.  Dizziness may simply have been BPPV.  Resolved.   3.  Rheumatoid arthritis.  PLAN: 1.  Will check MRI of lumbar spine without contrast to evaluate for any worsening stenosis 2.  Refer to physical therapy 3.  Optimize stroke risk factors:  Start ASA 81mg  daily, cholesterol/glycemic/blood pressure control. 4.  Further recommendations pending MRI results.  Thank you for allowing me to take part in the care of this patient.  Metta Clines, DO  CC:  Marrian Salvage, [place]

## 2019-12-25 ENCOUNTER — Ambulatory Visit: Payer: Medicare PPO | Admitting: Neurology

## 2019-12-25 ENCOUNTER — Encounter: Payer: Self-pay | Admitting: Neurology

## 2019-12-25 ENCOUNTER — Other Ambulatory Visit: Payer: Self-pay

## 2019-12-25 VITALS — BP 132/88 | HR 85 | Ht 74.0 in | Wt 187.0 lb

## 2019-12-25 DIAGNOSIS — I679 Cerebrovascular disease, unspecified: Secondary | ICD-10-CM | POA: Diagnosis not present

## 2019-12-25 DIAGNOSIS — M5416 Radiculopathy, lumbar region: Secondary | ICD-10-CM

## 2019-12-25 DIAGNOSIS — R2681 Unsteadiness on feet: Secondary | ICD-10-CM | POA: Diagnosis not present

## 2019-12-25 DIAGNOSIS — M4726 Other spondylosis with radiculopathy, lumbar region: Secondary | ICD-10-CM | POA: Diagnosis not present

## 2019-12-25 NOTE — Patient Instructions (Addendum)
1.  Physical therapy 2.  MRI of lumbar spine without contrast. 3.  Further recommendations pending results. 4.  Take aspirin 81mg  daily 5.  Blood pressure and cholesterol control

## 2020-01-21 ENCOUNTER — Ambulatory Visit (HOSPITAL_COMMUNITY)
Admission: RE | Admit: 2020-01-21 | Discharge: 2020-01-21 | Disposition: A | Discharge status: Home / Self Care | Payer: Medicare PPO | Source: Ambulatory Visit | Attending: Neurology | Admitting: Neurology

## 2020-01-21 ENCOUNTER — Other Ambulatory Visit: Payer: Self-pay

## 2020-01-21 DIAGNOSIS — R2681 Unsteadiness on feet: Secondary | ICD-10-CM | POA: Insufficient documentation

## 2020-01-21 DIAGNOSIS — M5416 Radiculopathy, lumbar region: Secondary | ICD-10-CM | POA: Diagnosis present

## 2020-01-22 ENCOUNTER — Telehealth: Payer: Self-pay

## 2020-01-22 NOTE — Telephone Encounter (Signed)
Left VM to inform pt of test results per Dr Tomi Likens -  MRI of lumbar spine shows progression of arthritis in the lower back causing pinching of nerves which I believe are contributing to his balance problems. Recommend referral to neurosurgery for evaluation.   Requested call back if he is interested in proceeding with neurosurgery referral.

## 2020-02-03 ENCOUNTER — Telehealth: Payer: Self-pay

## 2020-02-03 ENCOUNTER — Other Ambulatory Visit: Payer: Self-pay

## 2020-02-03 DIAGNOSIS — M5416 Radiculopathy, lumbar region: Secondary | ICD-10-CM

## 2020-02-03 DIAGNOSIS — M4726 Other spondylosis with radiculopathy, lumbar region: Secondary | ICD-10-CM

## 2020-02-03 DIAGNOSIS — R2681 Unsteadiness on feet: Secondary | ICD-10-CM

## 2020-02-03 NOTE — Progress Notes (Signed)
Order entered for neurosurgery referral at pt request. Sent to [facility] w/ pt preference of Dr Arnoldo Morale or Dr Vertell Limber noted. Fax [phone]

## 2020-02-03 NOTE — Telephone Encounter (Signed)
Spoke with pt re MRI results and Dr Georgie Chard recommendation of [facility], he verbalized understanding.

## 2020-02-06 ENCOUNTER — Encounter: Payer: Self-pay | Admitting: Family

## 2020-02-07 ENCOUNTER — Other Ambulatory Visit: Payer: Self-pay | Admitting: Internal Medicine

## 2020-02-08 ENCOUNTER — Other Ambulatory Visit: Payer: Self-pay

## 2020-02-08 ENCOUNTER — Other Ambulatory Visit: Payer: Self-pay | Admitting: Family

## 2020-02-08 DIAGNOSIS — R2681 Unsteadiness on feet: Secondary | ICD-10-CM

## 2020-02-08 DIAGNOSIS — M5416 Radiculopathy, lumbar region: Secondary | ICD-10-CM

## 2020-02-08 DIAGNOSIS — M4726 Other spondylosis with radiculopathy, lumbar region: Secondary | ICD-10-CM

## 2020-02-08 DIAGNOSIS — I679 Cerebrovascular disease, unspecified: Secondary | ICD-10-CM

## 2020-03-09 ENCOUNTER — Telehealth: Payer: Self-pay | Admitting: Neurology

## 2020-03-09 NOTE — Telephone Encounter (Signed)
Patient has an appt for his follow up after surgery on 05/02/20 at 1:40PM.

## 2020-03-20 ENCOUNTER — Encounter: Payer: Self-pay | Admitting: Family

## 2020-03-22 ENCOUNTER — Other Ambulatory Visit: Payer: Self-pay | Admitting: Family

## 2020-03-22 DIAGNOSIS — R040 Epistaxis: Secondary | ICD-10-CM

## 2020-04-06 ENCOUNTER — Other Ambulatory Visit: Payer: Self-pay

## 2020-04-06 ENCOUNTER — Ambulatory Visit: Payer: Medicare PPO | Admitting: Dermatology

## 2020-04-06 ENCOUNTER — Encounter: Payer: Self-pay | Admitting: Dermatology

## 2020-04-06 DIAGNOSIS — L821 Other seborrheic keratosis: Secondary | ICD-10-CM | POA: Diagnosis not present

## 2020-04-06 DIAGNOSIS — Z1283 Encounter for screening for malignant neoplasm of skin: Secondary | ICD-10-CM

## 2020-04-06 DIAGNOSIS — L309 Dermatitis, unspecified: Secondary | ICD-10-CM

## 2020-04-06 DIAGNOSIS — M1990 Unspecified osteoarthritis, unspecified site: Secondary | ICD-10-CM

## 2020-04-06 DIAGNOSIS — M7989 Other specified soft tissue disorders: Secondary | ICD-10-CM | POA: Diagnosis not present

## 2020-04-06 NOTE — Patient Instructions (Addendum)
  Follow up visit for Russell [name] date of birth [birth year].  We discussed the multiple other health issues he is having with severe swelling and arthritis in his hands the need for more spinal surgery, slow healing on his legs.  He is going to embark on a challenging diet which will hopefully help many of his general health issues.  He continues to get some central facial breaking out and we discussed ivermectin cream in some detail.  With use of the Covid mask it is unlikely that he will be able to get clear but this is mostly an annoying aesthetic condition, and in view of upcoming surgery we just will do no intervention.  Waist up examination showed no atypical moles or melanoma or skin cancer.  There is a regrowth around a small scar from a biopsy on his right back shoulder.  The biopsy showed a benign keratosis, the new growth is a benign keratosis confirm from with dermoscopy and no further procedure is needed. Virgil knows that are happy to see him for any future problems and in the future I might remember to call him Zenia Resides instead of [employer]

## 2020-04-07 DIAGNOSIS — M47812 Spondylosis without myelopathy or radiculopathy, cervical region: Secondary | ICD-10-CM

## 2020-04-07 HISTORY — DX: Spondylosis without myelopathy or radiculopathy, cervical region: M47.812

## 2020-04-08 ENCOUNTER — Telehealth: Payer: Self-pay | Admitting: Urology

## 2020-04-08 NOTE — Telephone Encounter (Signed)
He saw Dr. Diona Fanti in 2012. He will be a new pt.

## 2020-04-08 NOTE — Telephone Encounter (Signed)
Russell [name] with [facility] called and stated pt was d[facility] from [facility] after c [facility] fusion surgery. He has a cath due to [place] retention per the message and he needs to see Dr Diona Fanti per [place]. He states he has seen him before, I do not see anything in Epic. Wasn't sure if he is an [facility] patient?

## 2020-04-10 HISTORY — PX: CERVICAL SPINE SURGERY: SHX589

## 2020-04-11 ENCOUNTER — Encounter: Payer: Self-pay | Admitting: Urology

## 2020-04-11 ENCOUNTER — Ambulatory Visit (INDEPENDENT_AMBULATORY_CARE_PROVIDER_SITE_OTHER): Payer: Medicare PPO | Admitting: Urology

## 2020-04-11 ENCOUNTER — Other Ambulatory Visit: Payer: Self-pay

## 2020-04-11 VITALS — BP 144/83 | HR 71 | Temp 98.4°F | Ht 74.0 in | Wt 187.0 lb

## 2020-04-11 DIAGNOSIS — N401 Enlarged prostate with lower urinary tract symptoms: Secondary | ICD-10-CM | POA: Diagnosis not present

## 2020-04-11 DIAGNOSIS — N138 Other obstructive and reflux uropathy: Secondary | ICD-10-CM | POA: Diagnosis not present

## 2020-04-11 DIAGNOSIS — R339 Retention of urine, unspecified: Secondary | ICD-10-CM | POA: Diagnosis not present

## 2020-04-11 NOTE — Progress Notes (Signed)
Fill and Pull Catheter Removal  Patient is present today for a catheter removal.  Patient was cleaned and prepped in a sterile fashion 220ml of sterile water/ saline was instilled into the bladder when the patient felt the urge to urinate. 68ml of water was then drained from the balloon.  A 16FR foley cath was removed from the bladder no complications were noted .  Patient as then given some time to void on their own.  Patient can void  173 ml on their own after some time.  Patient tolerated well.  Performed by: Valentina Lucks  Follow up/ Additional notes: 1 month w PVR

## 2020-04-11 NOTE — Progress Notes (Signed)
04/11/2020 11:44 AM   Russell [name] [birth year] [id number]  Referring provider: Marrian Salvage, [place],  [place] [address]  Urinary retention  HPI: [name] is a 74yo here for evaluation of urinary retention. He had C3/C4 fusion 4 days ago and was unable to urinate after surgery. He required 2 CIC and then an indwelling foley was placed. He was previously prescribed flomax but has not taken the medication. Prior to surgery he had mild LUTS.Nocturia 0x. Urine stream weak in morning but improves throughout the day.   PMH: Past Medical History:  Diagnosis Date  . Anemia 2019  . Arthritis   . Bleeding internal hemorrhoids 09/10/2006   Qualifier: Diagnosis of  By: Jonna Munro MD, Roderic Scarce    . 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 Cerebellar infarct ([place])   . Cervical spine degeneration   . Constipation   . GERD (gastroesophageal reflux disease)   . Hemorrhoids   . Hx of colonic polyp 05/29/2018   05/2018 cecal ssp  . Kidney stone    Remote  . Maxillary sinus polyp   . Prolapsed internal hemorrhoids, grade 3 09/10/2006   Qualifier: Diagnosis of  By: Jonna Munro MD, Roderic Scarce    . Salivary gland enlargement   . Sleep apnea 10 years ago   declined CPAP  . Stroke ([place]) 15-20 years ago    Surgical History: Past Surgical History:  Procedure Laterality Date  . COLONOSCOPY    . HEMORRHOID BANDING    . LUMBAR LAMINECTOMY  03/08/2010   L2-3, cauda equina syndrome  . UPPER GI ENDOSCOPY      Home Medications:  Allergies as of 04/11/2020   No Known Allergies     Medication List       Accurate as of April 11, 2020 11:44 AM. If you have any questions, ask your nurse or doctor.        aspirin 81 MG chewable tablet Chew by mouth daily.   aspirin 81 MG EC tablet Take by mouth.   cholecalciferol 25 MCG (1000 UNIT) tablet Commonly known as: VITAMIN D3 Take 1,000 Units by mouth daily.   Cholecalciferol 50 MCG (2000 UT) Caps Take 1 capsule  by mouth daily.   Fish Oil 1200 MG Caps Take 1 capsule by mouth 2 (two) times daily.   HYDROcodone-acetaminophen 5-325 MG tablet Commonly known as: NORCO/VICODIN Take by mouth.   IRON PO Take 50 mg by mouth daily.   magnesium 30 MG tablet Take 30 mg by mouth 2 (two) times daily.   MAGNESIUM CHLORIDE PO Take 1 tablet by mouth daily.   omeprazole 40 MG capsule Commonly known as: PRILOSEC Take by mouth.   omeprazole 40 MG capsule Commonly known as: PRILOSEC TAKE ONE CAPSULE BY MOUTH DAILY   pyridOXINE 50 MG tablet Commonly known as: VITAMIN B-6 Take 50 mg by mouth daily.   Soolantra 1 % Crea Generic drug: Ivermectin as needed.   tamsulosin 0.4 MG Caps capsule Commonly known as: FLOMAX Take 1 capsule (0.4 mg total) by mouth daily.   tobramycin-dexamethasone ophthalmic solution Commonly known as: TOBRADEX IGT IN THE RIGHT EYE FOUR TIMES DAILY   TURMERIC PO Take 500 mg by mouth daily.   VITAMIN B-12 PO Take 1,000 mcg by mouth daily.   cyanocobalamin 1000 MCG tablet Take by mouth.   vitamin C 500 MG tablet Commonly known as: ASCORBIC ACID Take 500 mg by mouth daily.       Allergies: No Known Allergies  Family History: Family History  Problem Relation Age of Onset  . Aortic aneurysm Mother   . Heart attack Father   . Aortic aneurysm Sister   . CVA Sister   . Colon cancer Neg Hx   . Pancreatic cancer Neg Hx   . Rectal cancer Neg Hx   . Stomach cancer Neg Hx     Social History:  reports that he quit smoking about 45 years ago. His smoking use included cigarettes. He started smoking about 54 years ago. He smoked 0.25 packs per day. He has never used smokeless tobacco. He reports previous alcohol use. He reports that he does not use drugs.  ROS: All other review of systems were reviewed and are negative except what is noted above in HPI  Physical Exam: BP (!) 144/83   Pulse 71   Temp 98.4 F (36.9 C)   Ht 6\' 2"  (1.88 m)   Wt 187 lb (84.8 kg)    BMI 24.01 kg/m   Constitutional:  Alert and oriented, No acute distress. HEENT:  AT, moist mucus membranes.  Trachea midline, no masses. Cardiovascular: No clubbing, cyanosis, or edema. Respiratory: Normal respiratory effort, no increased work of breathing. GI: Abdomen is soft, nontender, nondistended, no abdominal masses GU: No CVA tenderness. Circumcised phallus. No masses/lesions on penis, testis, scrotum. Prostate 40g smooth no nodules no induration.  Lymph: No cervical or inguinal lymphadenopathy. Skin: No rashes, bruises or suspicious lesions. Neurologic: Grossly intact, no focal deficits, moving all 4 extremities. Psychiatric: Normal mood and affect.  Laboratory Data: Lab Results  Component Value Date   WBC 5.9 09/28/2019   HGB 12.7 (L) 09/28/2019   HCT 39.3 09/28/2019   MCV 79.2 09/28/2019   PLT 207.0 09/28/2019    Lab Results  Component Value Date   CREATININE 0.69 09/28/2019    Lab Results  Component Value Date   PSA 1.95 09/28/2019   PSA 1.10 02/10/2009    Lab Results  Component Value Date   TESTOSTERONE 467.03 09/12/2006    Lab Results  Component Value Date   HGBA1C 5.6 09/29/2019    Urinalysis    Component Value Date/Time   COLORURINE YELLOW 03/07/2010 2315   APPEARANCEUR CLEAR 03/07/2010 2315   LABSPEC 1.021 03/07/2010 2315   PHURINE 6.0 03/07/2010 2315   GLUCOSEU NEGATIVE 03/07/2010 2315   HGBUR NEGATIVE 03/07/2010 2315   BILIRUBINUR negative 03/20/2018 1406   [place] 03/07/2010 2315   PROTEINUR Negative 03/20/2018 1406   PROTEINUR NEGATIVE 03/07/2010 2315   UROBILINOGEN 1.0 03/20/2018 1406   UROBILINOGEN 0.2 03/07/2010 2315   NITRITE negative 03/20/2018 1406   NITRITE NEGATIVE 03/07/2010 2315   LEUKOCYTESUR Large (3+) (A) 03/20/2018 1406    No results found for: LABMICR, WBCUA, RBCUA, LABEPIT, MUCUS, BACTERIA  Pertinent Imaging:  No results found for this or any previous visit.  No results found for this or any  previous visit.  No results found for this or any previous visit.  No results found for this or any previous visit.  No results found for this or any previous visit.  No results found for this or any previous visit.  No results found for this or any previous visit.  No results found for this or any previous visit.   Assessment & Plan:    1. Benign prostatic hyperplasia with urinary obstruction -continue flomax 0.4mg  daily  2. Urinary retention Voiding trial passed today. RTC 1 month with PVR   No follow-ups on file.  Nicolette Bang, MD  [facility] [place]

## 2020-04-11 NOTE — Progress Notes (Signed)
Urological Symptom Review  Patient is experiencing the following symptoms: Frequent urination Get up at night to urinate Stream starts and stops Trouble starting stream Weak stream Erection problems (male only)   Review of Systems  Gastrointestinal (upper)  : Negative for upper GI symptoms  Gastrointestinal (lower) : Negative for lower GI symptoms  Constitutional : Negative for symptoms  Skin: Negative for skin symptoms  Eyes: Negative for eye symptoms  Ear/Nose/Throat : Sinus problems  Hematologic/Lymphatic: Negative for Hematologic/Lymphatic symptoms  Cardiovascular : Leg swelling  Respiratory : Negative for respiratory symptoms  Endocrine: Negative for endocrine symptoms  Musculoskeletal: Back pain Joint pain  Neurological: Headaches  Psychologic: Negative for psychiatric symptoms

## 2020-04-12 ENCOUNTER — Ambulatory Visit: Payer: Medicare PPO | Admitting: Urology

## 2020-04-14 ENCOUNTER — Encounter: Payer: Self-pay | Admitting: Urology

## 2020-04-14 NOTE — Patient Instructions (Signed)

## 2020-04-30 ENCOUNTER — Encounter: Payer: Self-pay | Admitting: Urology

## 2020-05-02 ENCOUNTER — Other Ambulatory Visit: Payer: Self-pay

## 2020-05-02 DIAGNOSIS — R310 Gross hematuria: Secondary | ICD-10-CM

## 2020-05-03 ENCOUNTER — Other Ambulatory Visit: Payer: Medicare PPO

## 2020-05-03 ENCOUNTER — Other Ambulatory Visit: Payer: Self-pay

## 2020-05-03 LAB — URINALYSIS, ROUTINE W REFLEX MICROSCOPIC
Bilirubin, UA: NEGATIVE
Glucose, UA: NEGATIVE
Leukocytes,UA: NEGATIVE
Nitrite, UA: NEGATIVE
Protein,UA: NEGATIVE
RBC, UA: NEGATIVE
Specific Gravity, UA: 1.02 (ref 1.005–1.030)
Urobilinogen, Ur: 1 mg/dL (ref 0.2–1.0)
pH, UA: 5.5 (ref 5.0–7.5)

## 2020-05-04 ENCOUNTER — Other Ambulatory Visit: Payer: Self-pay | Admitting: Otolaryngology

## 2020-05-04 DIAGNOSIS — R1313 Dysphagia, pharyngeal phase: Secondary | ICD-10-CM

## 2020-05-05 NOTE — Progress Notes (Signed)
   Follow-Up Visit   Subjective  Russell [name] is a 74 y.o. male who presents for the following: Annual Exam (shoulder recheck prev bx has now got larger, rosacea on face patient d/c soolantra due to upcoming surgery).  Annual skin exam Location:  Duration:  Quality:  Associated Signs/Symptoms: Modifying Factors:  Severity:  Timing: Context:   Objective  Well appearing patient in no apparent distress; mood and affect are within normal limits.  A full examination was performed including scalp, head, eyes, ears, nose, lips, neck, chest, axillae, abdomen, back, buttocks, bilateral upper extremities, bilateral lower extremities, hands, feet, fingers, toes, fingernails, and toenails. All findings within normal limits unless otherwise noted below.   Assessment & Plan       Encounter for screening for malignant neoplasm of skin Left Breast  No atypical moles or non mole skin cancer-Yearly skin check  Dermatitis Head - Anterior (Face)  Treatment options discussed- not likely to go away  due to COVID Mask; discussed taking Ivermectin in future if no better.  Follow up visit for Russell [name] date of birth 1945/11/05.  We discussed the multiple other health issues he is having with severe swelling and arthritis in his hands the need for more spinal surgery, slow healing on his legs.  He is going to embark on a challenging diet which will hopefully help many of his general health issues.  He continues to get some central facial breaking out and we discussed ivermectin cream in some detail.  With use of the Covid mask it is unlikely that he will be able to get clear but this is mostly an annoying aesthetic condition, and in view of upcoming surgery we just will do no intervention.  Waist up examination showed no atypical moles or melanoma or skin cancer.  There is a regrowth around a small scar from a biopsy on his right back shoulder.  The biopsy showed a benign keratosis, the new  growth is a benign keratosis confirm from with dermoscopy and no further procedure is needed. [place] knows that are happy to see him for any future problems and in the future I might remember to call him Russell [name] instead of Russell [name], Russell Monarch, MD, have reviewed all documentation for this visit.  The documentation on 05/05/20 for the exam, diagnosis, procedures, and orders are all accurate and complete.

## 2020-05-06 ENCOUNTER — Encounter: Payer: Self-pay | Admitting: Urology

## 2020-05-06 LAB — URINE CULTURE

## 2020-05-08 ENCOUNTER — Encounter: Payer: Self-pay | Admitting: Dermatology

## 2020-05-09 ENCOUNTER — Other Ambulatory Visit: Payer: Self-pay

## 2020-05-09 ENCOUNTER — Ambulatory Visit: Payer: Medicare PPO | Admitting: Urology

## 2020-05-09 DIAGNOSIS — R310 Gross hematuria: Secondary | ICD-10-CM

## 2020-05-09 MED ORDER — NITROFURANTOIN MONOHYD MACRO 100 MG PO CAPS: 100.0000 mg | ORAL_CAPSULE | Freq: Two times a day (BID) | ORAL | 0 refills | Status: DC

## 2020-05-09 NOTE — Progress Notes (Signed)
Pt called and made aware of antibiotic sent in. Pt. states he will not start antibiotic until Dr.Wilson gives him to the ok. Dr. Alyson Ingles made aware.

## 2020-05-10 DIAGNOSIS — Z981 Arthrodesis status: Secondary | ICD-10-CM | POA: Insufficient documentation

## 2020-05-11 ENCOUNTER — Encounter: Payer: Self-pay | Admitting: Urology

## 2020-05-11 ENCOUNTER — Ambulatory Visit (INDEPENDENT_AMBULATORY_CARE_PROVIDER_SITE_OTHER): Payer: Medicare PPO | Admitting: Urology

## 2020-05-11 ENCOUNTER — Other Ambulatory Visit: Payer: Self-pay

## 2020-05-11 VITALS — BP 132/75 | HR 99 | Temp 98.0°F | Ht 74.0 in | Wt 187.0 lb

## 2020-05-11 DIAGNOSIS — R310 Gross hematuria: Secondary | ICD-10-CM | POA: Diagnosis not present

## 2020-05-11 DIAGNOSIS — N5201 Erectile dysfunction due to arterial insufficiency: Secondary | ICD-10-CM | POA: Insufficient documentation

## 2020-05-11 DIAGNOSIS — R339 Retention of urine, unspecified: Secondary | ICD-10-CM | POA: Diagnosis not present

## 2020-05-11 LAB — MICROSCOPIC EXAMINATION
Bacteria, UA: NONE SEEN
Epithelial Cells (non renal): NONE SEEN /hpf (ref 0–10)
RBC, Urine: NONE SEEN /hpf (ref 0–2)

## 2020-05-11 LAB — BLADDER SCAN AMB NON-IMAGING: Scan Result: 92

## 2020-05-11 LAB — URINALYSIS, ROUTINE W REFLEX MICROSCOPIC
Bilirubin, UA: NEGATIVE
Glucose, UA: NEGATIVE
Nitrite, UA: NEGATIVE
Protein,UA: NEGATIVE
RBC, UA: NEGATIVE
Specific Gravity, UA: 1.015 (ref 1.005–1.030)
Urobilinogen, Ur: 0.2 mg/dL (ref 0.2–1.0)
pH, UA: 5.5 (ref 5.0–7.5)

## 2020-05-11 MED ORDER — TAMSULOSIN HCL 0.4 MG PO CAPS: 0.4000 mg | ORAL_CAPSULE | Freq: Every day | ORAL | 3 refills | Status: DC

## 2020-05-11 NOTE — Progress Notes (Signed)
05/11/2020 9:10 AM   Russell [name] [birth year] [id number]  Referring provider: Marrian Salvage, [place],  [place] [address]  Followup BPH  HPI: Mr Ashmead is a 74yo here for followup for BPH and urinary retention. He is currently on flomax 0.4mg  daily. He had a positive urine culture and is currently on macrobid. His LUTS have significantly improved since last visit. He is happy with his urination.  He has issues getting an erection. He has previously tried Cialis, Viagra, and ICI. He is currently using ErectAid and has been working well but the bands do not last and often break.    PMH: Past Medical History:  Diagnosis Date  . Anemia 2019  . Arthritis   . Bleeding internal hemorrhoids 09/10/2006   Qualifier: Diagnosis of  By: Jonna Munro MD, Roderic Scarce    . 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[place]) 2011  . Cerebellar infarct ([place])   . Cervical spine degeneration   . Constipation   . GERD (gastroesophageal reflux disease)   . Hemorrhoids   . Hx of colonic polyp 05/29/2018   05/2018 cecal ssp  . Kidney stone    Remote  . Maxillary sinus polyp   . Prolapsed internal hemorrhoids, grade 3 09/10/2006   Qualifier: Diagnosis of  By: Jonna Munro MD, Roderic Scarce    . Salivary gland enlargement   . Sleep apnea 10 years ago   declined CPAP  . Stroke ([place]) 15-20 years ago    Surgical History: Past Surgical History:  Procedure Laterality Date  . COLONOSCOPY    . HEMORRHOID BANDING    . LUMBAR LAMINECTOMY  03/08/2010   L2-3, cauda equina syndrome  . UPPER GI ENDOSCOPY      Home Medications:  Allergies as of 05/11/2020   No Known Allergies     Medication List       Accurate as of May 11, 2020  9:10 AM. If you have any questions, ask your nurse or doctor.        STOP taking these medications   aspirin 81 MG chewable tablet Stopped by: Nicolette Bang, MD   aspirin 81 MG EC tablet Stopped by: Nicolette Bang, MD   tamsulosin 0.4 MG Caps  capsule Commonly known as: FLOMAX Stopped by: Nicolette Bang, MD   tobramycin-dexamethasone ophthalmic solution Commonly known as: TOBRADEX Stopped by: Nicolette Bang, MD     TAKE these medications   cholecalciferol 25 MCG (1000 UNIT) tablet Commonly known as: VITAMIN D3 Take 1,000 Units by mouth daily.   Cholecalciferol 50 MCG (2000 UT) Caps Take 1 capsule by mouth daily.   Fish Oil 1200 MG Caps Take 1 capsule by mouth 2 (two) times daily.   IRON PO Take 50 mg by mouth daily.   magnesium 30 MG tablet Take 30 mg by mouth 2 (two) times daily.   MAGNESIUM CHLORIDE PO Take 1 tablet by mouth daily.   nitrofurantoin (macrocrystal-monohydrate) 100 MG capsule Commonly known as: MACROBID Take 1 capsule (100 mg total) by mouth 2 (two) times daily.   omeprazole 40 MG capsule Commonly known as: PRILOSEC Take by mouth.   omeprazole 40 MG capsule Commonly known as: PRILOSEC TAKE ONE CAPSULE BY MOUTH DAILY   pyridOXINE 50 MG tablet Commonly known as: VITAMIN B-6 Take 50 mg by mouth daily.   Soolantra 1 % Crea Generic drug: Ivermectin as needed.   TURMERIC PO Take 500 mg by mouth daily.   VITAMIN B-12 PO Take 1,000 mcg by mouth daily.   cyanocobalamin  1000 MCG tablet Take by mouth.   vitamin C 500 MG tablet Commonly known as: ASCORBIC ACID Take 500 mg by mouth daily.       Allergies: No Known Allergies  Family History: Family History  Problem Relation Age of Onset  . Aortic aneurysm Mother   . Heart attack Father   . Aortic aneurysm Sister   . CVA Sister   . Colon cancer Neg Hx   . Pancreatic cancer Neg Hx   . Rectal cancer Neg Hx   . Stomach cancer Neg Hx     Social History:  reports that he quit smoking about 45 years ago. His smoking use included cigarettes. He started smoking about 54 years ago. He smoked 0.25 packs per day. He has never used smokeless tobacco. He reports previous alcohol use. He reports that he does not use drugs.  ROS: All  other review of systems were reviewed and are negative except what is noted above in HPI  Physical Exam: BP 132/75   Pulse 99   Temp 98 F (36.7 C)   Ht 6\' 2"  (1.88 m)   Wt 187 lb (84.8 kg)   BMI 24.01 kg/m   Constitutional:  Alert and oriented, No acute distress. HEENT: [place] AT, moist mucus membranes.  Trachea midline, no masses. Cardiovascular: No clubbing, cyanosis, or edema. Respiratory: Normal respiratory effort, no increased work of breathing. GI: Abdomen is soft, nontender, nondistended, no abdominal masses GU: No CVA tenderness.  Lymph: No cervical or inguinal lymphadenopathy. Skin: No rashes, bruises or suspicious lesions. Neurologic: Grossly intact, no focal deficits, moving all 4 extremities. Psychiatric: Normal mood and affect.  Laboratory Data: Lab Results  Component Value Date   WBC 5.9 09/28/2019   HGB 12.7 (L) 09/28/2019   HCT 39.3 09/28/2019   MCV 79.2 09/28/2019   PLT 207.0 09/28/2019    Lab Results  Component Value Date   CREATININE 0.69 09/28/2019    Lab Results  Component Value Date   PSA 1.95 09/28/2019   PSA 1.10 02/10/2009    Lab Results  Component Value Date   TESTOSTERONE 467.03 09/12/2006    Lab Results  Component Value Date   HGBA1C 5.6 09/29/2019    Urinalysis    Component Value Date/Time   COLORURINE YELLOW 03/07/2010 2315   APPEARANCEUR Clear 05/03/2020 1038   LABSPEC 1.021 03/07/2010 2315   PHURINE 6.0 03/07/2010 2315   GLUCOSEU Negative 05/03/2020 1038   HGBUR NEGATIVE 03/07/2010 2315   BILIRUBINUR Negative 05/03/2020 1038   KETONESUR NEGATIVE 03/07/2010 2315   PROTEINUR Negative 05/03/2020 1038   PROTEINUR NEGATIVE 03/07/2010 2315   UROBILINOGEN 1.0 03/20/2018 1406   UROBILINOGEN 0.2 03/07/2010 2315   NITRITE Negative 05/03/2020 1038   NITRITE NEGATIVE 03/07/2010 2315   LEUKOCYTESUR Negative 05/03/2020 1038    Lab Results  Component Value Date   LABMICR Comment 05/03/2020    Pertinent Imaging:  No results  found for this or any previous visit.  No results found for this or any previous visit.  No results found for this or any previous visit.  No results found for this or any previous visit.  No results found for this or any previous visit.  No results found for this or any previous visit.  No results found for this or any previous visit.  No results found for this or any previous visit.   Assessment & Plan:    1. Gross hematuria -likely related to UTI - Urinalysis, Routine w reflex microscopic  2.  Urinary retention -Continue flomax 0.4mg  daily - BLADDER SCAN AMB NON-IMAGING  3. Erectile dysfunction -The patient will send paperwork for a new vacuum erection device.   No follow-ups on file.  Nicolette Bang, MD  [facility] [place]

## 2020-05-11 NOTE — Progress Notes (Signed)
PVR=92  Urological Symptom Review  Patient is experiencing the following symptoms: Hard to postpone urination Leakage of urine Stream starts and stops Urinary tract infection   Review of Systems  Gastrointestinal (upper)  : Negative for upper GI symptoms  Gastrointestinal (lower) : Negative for lower GI symptoms  Constitutional : Negative for symptoms  Skin: Negative for skin symptoms  Eyes: Negative for eye symptoms  Ear/Nose/Throat : Negative for Ear/Nose/Throat symptoms  Hematologic/Lymphatic: Negative for Hematologic/Lymphatic symptoms  Cardiovascular : Negative for cardiovascular symptoms  Respiratory : Negative for respiratory symptoms  Endocrine: Negative for endocrine symptoms  Musculoskeletal: Negative for musculoskeletal symptoms  Neurological: Negative for neurological symptoms  Psychologic: Negative for psychiatric symptoms

## 2020-05-11 NOTE — Patient Instructions (Signed)

## 2020-05-12 DIAGNOSIS — Z9889 Other specified postprocedural states: Secondary | ICD-10-CM

## 2020-05-12 HISTORY — DX: Other specified postprocedural states: Z98.890

## 2020-05-12 HISTORY — PX: OTHER SURGICAL HISTORY: SHX169

## 2020-05-13 DIAGNOSIS — Z9889 Other specified postprocedural states: Secondary | ICD-10-CM | POA: Insufficient documentation

## 2020-05-25 ENCOUNTER — Telehealth: Payer: Self-pay | Admitting: Internal Medicine

## 2020-05-25 NOTE — Telephone Encounter (Signed)
Patient reports that he has been having constipation since his recent surgery.  He reports that he has stopped his Miralax and stool softeners since surgery and is asking if it is okay to resume.  He is advised safe to resume his regimen and call back if he continues to have constipation.

## 2020-06-03 ENCOUNTER — Other Ambulatory Visit: Payer: Self-pay | Admitting: Internal Medicine

## 2020-06-20 ENCOUNTER — Telehealth: Payer: Self-pay | Admitting: Family

## 2020-06-20 NOTE — Telephone Encounter (Signed)
Called to schedule AWV with [facility]. Patient stated he will all me back when his wife gets in.

## 2020-07-03 ENCOUNTER — Encounter (HOSPITAL_COMMUNITY): Payer: Self-pay | Admitting: Emergency Medicine

## 2020-07-03 ENCOUNTER — Emergency Department (HOSPITAL_COMMUNITY)
Admission: EM | Admit: 2020-07-03 | Discharge: 2020-07-03 | Payer: Medicare PPO | Attending: Emergency Medicine | Admitting: Emergency Medicine

## 2020-07-03 ENCOUNTER — Other Ambulatory Visit: Payer: Self-pay

## 2020-07-03 DIAGNOSIS — R339 Retention of urine, unspecified: Secondary | ICD-10-CM | POA: Insufficient documentation

## 2020-07-03 DIAGNOSIS — M79605 Pain in left leg: Secondary | ICD-10-CM | POA: Diagnosis present

## 2020-07-03 DIAGNOSIS — G834 Cauda equina syndrome: Secondary | ICD-10-CM

## 2020-07-03 DIAGNOSIS — Z87891 Personal history of nicotine dependence: Secondary | ICD-10-CM | POA: Diagnosis not present

## 2020-07-03 DIAGNOSIS — Z8673 Personal history of transient ischemic attack (TIA), and cerebral infarction without residual deficits: Secondary | ICD-10-CM | POA: Diagnosis not present

## 2020-07-03 DIAGNOSIS — I10 Essential (primary) hypertension: Secondary | ICD-10-CM | POA: Diagnosis not present

## 2020-07-03 DIAGNOSIS — Z9889 Other specified postprocedural states: Secondary | ICD-10-CM

## 2020-07-03 DIAGNOSIS — Z20822 Contact with and (suspected) exposure to covid-19: Secondary | ICD-10-CM | POA: Insufficient documentation

## 2020-07-03 LAB — RESP PANEL BY RT-PCR (FLU A&B, COVID) ARPGX2
Influenza A by PCR: NEGATIVE
Influenza B by PCR: NEGATIVE
SARS Coronavirus 2 by RT PCR: NEGATIVE

## 2020-07-03 MED ORDER — MORPHINE SULFATE (PF) 4 MG/ML IV SOLN
4.0000 mg | Freq: Once | INTRAVENOUS | Status: AC | PRN
  Administered 2020-07-03: 4 mg via INTRAVENOUS
  Filled 2020-07-03: qty 1

## 2020-07-03 MED ORDER — ONDANSETRON HCL 4 MG/2ML IJ SOLN
4.0000 mg | Freq: Once | INTRAMUSCULAR | Status: AC | PRN
  Administered 2020-07-03: 4 mg via INTRAVENOUS
  Filled 2020-07-03: qty 2

## 2020-07-03 NOTE — ED Notes (Signed)
Pt with recent surgery at [facility]- next appt Jan   Pt reports he has had creeping numbness to his leg and now cannot walk on it  Also reports unable to void since last night   Bladder scan reveals 998 in bladder  VO Dr Ashok Cordia for indweling to leave in for now

## 2020-07-03 NOTE — ED Notes (Signed)
Called [facility] ground [facility] at this time for transport    At [phone]

## 2020-07-03 NOTE — ED Triage Notes (Signed)
Recent laminectomy in [place] at [facility]  Now with pain to L leg pain   And inability to urinate

## 2020-07-03 NOTE — ED Notes (Signed)
Awaiting eval  

## 2020-07-03 NOTE — ED Provider Notes (Signed)
[facility] EMERGENCY DEPARTMENT Provider Note   CSN: [id number] Arrival date & time: 07/03/20  1334     History Chief Complaint  Patient presents with  . Leg Pain    L leg pain after laminectomy  . Urinary Retention    Russell [name] is a 74 y.o. 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 Redmond Pulling of [facility].  Since his surgery he has had chronic numbness of his left upper medial thigh but otherwise was doing well until the past week when he started to have pain shooting down the back of his left leg now to his heel in association with numbness and increasing weakness of the left leg.  He woke this morning with extreme difficulty emptying his bladder reporting a weak stream, now is unable to urinate.  He endorses complete numbness currently of his left buttock thigh and genitals.  He was able to have a bowel movement this morning but states he did not have any sensation during the BM.  He has had a phone visit with his provider 5 days ago and was placed on prednisone which was temporarily improving his pain symptoms.  The history is provided by the patient and the spouse.       Past Medical History:  Diagnosis Date  . Anemia 2019  . Arthritis   . Bleeding internal hemorrhoids 09/10/2006   Qualifier: Diagnosis of  By: Jonna Munro MD, Roderic Scarce    . 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[place]) 2011  . 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 Cervical spine degeneration   . Constipation   . GERD (gastroesophageal reflux disease)   . Hemorrhoids   . Hx of colonic polyp 05/29/2018   05/2018 cecal ssp  . Kidney stone    Remote  . Maxillary sinus polyp   . Prolapsed internal hemorrhoids, grade 3 09/10/2006   Qualifier: Diagnosis of  By: Jonna Munro MD, Roderic Scarce    . Salivary gland enlargement   . Sleep apnea 10 years ago    declined CPAP  . Stroke ([place]) 15-20 years ago    Patient Active Problem List   Diagnosis Date Noted  . Erectile dysfunction due to arterial insufficiency 05/11/2020  . Gross hematuria 05/11/2020  . Benign prostatic hyperplasia with urinary obstruction 04/11/2020  . Urinary retention 04/11/2020  . DDD (degenerative disc disease), cervical 06/11/2018  . DDD (degenerative disc disease), lumbar 06/11/2018  . History of kidney stones 06/11/2018  . 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[place]) 06/11/2018  . Primary osteoarthritis of left knee 06/11/2018  . Primary osteoarthritis of both feet 06/11/2018  . Plantar fasciitis 06/11/2018  . Hx of colonic polyp 05/29/2018  . Left lumbar radiculopathy 01/07/2018  . Diastolic dysfunction 01/13/1600  . Psoriasis 10/15/2016  . DEGENERATIVE JOINT DISEASE 02/07/2009  . HEADACHE 02/07/2009  . GERD 10/02/2007  . HYPERTENSION, BENIGN ESSENTIAL 10/08/2006  . 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 ERECTILE DYSFUNCTION 09/10/2006  . Prolapsed internal hemorrhoids, grade 2 and 3 09/10/2006  . PLANTAR FASCIITIS, BILATERAL 09/10/2006  . SLEEP APNEA 09/10/2006  . MALAISE AND FATIGUE 09/10/2006    Past Surgical History:  Procedure Laterality Date  . COLONOSCOPY    . HEMORRHOID BANDING    . LUMBAR LAMINECTOMY  03/08/2010   L2-3, cauda equina syndrome  . UPPER GI ENDOSCOPY  Family History  Problem Relation Age of Onset  . Aortic aneurysm Mother   . Heart attack Father   . Aortic aneurysm Sister   . CVA Sister   . Colon cancer Neg Hx   . Pancreatic cancer Neg Hx   . Rectal cancer Neg Hx   . Stomach cancer Neg Hx     Social History   Tobacco Use  . Smoking status: Former Smoker    Packs/day: 0.25    Types: Cigarettes    Start date: 10/15/1965    Quit date: 10/16/1974    Years since quitting: 45.7  . Smokeless tobacco: Never Used  Vaping Use  . Vaping Use: Never used  Substance Use Topics  . Alcohol use:  Not Currently    Comment: rarely wine  . Drug use: No    Home Medications Prior to Admission medications   Medication Sig Start Date End Date Taking? Authorizing Provider  cholecalciferol (VITAMIN D3) 25 MCG (1000 UNIT) tablet Take 1,000 Units by mouth daily.    [provider]  Cholecalciferol 50 MCG (2000 UT) CAPS Take 1 capsule by mouth daily.    [provider]  Cyanocobalamin (VITAMIN B-12 PO) Take 1,000 mcg by mouth daily.    [provider]  cyanocobalamin 1000 MCG tablet Take by mouth.    [provider]  IRON PO Take 50 mg by mouth daily.    [provider]  magnesium 30 MG tablet Take 30 mg by mouth 2 (two) times daily. Patient not taking: Reported on 05/11/2020    [provider]  MAGNESIUM CHLORIDE PO Take 1 tablet by mouth daily.    [provider]  nitrofurantoin, macrocrystal-monohydrate, (MACROBID) 100 MG capsule Take 1 capsule (100 mg total) by mouth 2 (two) times daily. 05/09/20   McKenzie, Candee Furbish, MD  Omega-3 Fatty Acids (FISH OIL) 1200 MG CAPS Take 1 capsule by mouth 2 (two) times daily.    [provider]  omeprazole (PRILOSEC) 40 MG capsule Take by mouth.    [provider]  omeprazole (PRILOSEC) 40 MG capsule TAKE ONE CAPSULE BY MOUTH DAILY 06/03/20   Gatha Mayer, MD  pyridOXINE (VITAMIN B-6) 50 MG tablet Take 50 mg by mouth daily.    [provider]  SOOLANTRA 1 % CREA as needed.  02/06/18   [provider]  tamsulosin (FLOMAX) 0.4 MG CAPS capsule Take 1 capsule (0.4 mg total) by mouth daily after supper. 05/11/20   McKenzie, Candee Furbish, MD  TURMERIC PO Take 500 mg by mouth daily.    [provider]  vitamin C (ASCORBIC ACID) 500 MG tablet Take 500 mg by mouth daily.    [provider]    Allergies    Patient has no known allergies.  Review of Systems   Review of Systems  Constitutional: Negative for chills and fever.  Respiratory: Negative  for shortness of breath.   Cardiovascular: Negative for chest pain and leg swelling.  Gastrointestinal: Negative for abdominal distention, abdominal pain and constipation.  Genitourinary: Positive for difficulty urinating. Negative for dysuria, flank pain, frequency and urgency.  Musculoskeletal: Positive for back pain and gait problem. Negative for joint swelling.  Skin: Negative for rash.  Neurological: Positive for weakness and numbness.    Physical Exam Updated Vital Signs BP (!) 158/98 (BP Location: Left Arm)   Pulse 72   Temp 97.9 F (36.6 C) (Oral)   Resp 16   Ht 6\' 2"  (1.88 m)  Wt 80.3 kg   SpO2 98%   BMI 22.73 kg/m   Physical Exam Vitals and nursing note reviewed. Exam conducted with a chaperone present.  Constitutional:      Appearance: He is well-developed and well-nourished.  HENT:     Head: Normocephalic.  Eyes:     Conjunctiva/sclera: Conjunctivae normal.  Cardiovascular:     Rate and Rhythm: Normal rate.     Pulses: Intact distal pulses.          Dorsalis pedis pulses are 2+ on the right side and 2+ on the left side.     Comments: Pedal pulses normal. Pulmonary:     Effort: Pulmonary effort is normal.  Abdominal:     General: Bowel sounds are normal. There is no distension.     Palpations: Abdomen is soft. There is no mass.  Musculoskeletal:        General: No edema. Normal range of motion.     Cervical back: Normal range of motion and neck supple.     Lumbar back: No swelling, edema, spasms or tenderness.     Comments: Well healed midline lumbar surgical incision.  Skin:    General: Skin is warm and dry.  Neurological:     Mental Status: He is alert.     Sensory: Sensory deficit present.     Motor: Motor function is intact. No tremor or atrophy.     Deep Tendon Reflexes: Strength normal.     Comments: No foot drop.  Decreased sensation to fine touch left posterior leg to heel, including left buttock and perirectal,  Reduced cremaster left.  Rectal  tone is reduced.  Pt can flex/ext ankle and knee minimally but limited by pain. Gait not assessed.   Psychiatric:        Mood and Affect: Mood and affect normal.     ED Results / Procedures / Treatments   Labs (all labs ordered are listed, but only abnormal results are displayed) Labs Reviewed - No data to display  EKG None  Radiology No results found.  Procedures Procedures (including critical care time)  Medications Ordered in ED Medications - No data to display  ED Course  I have reviewed the triage vital signs and the nursing notes.  Pertinent labs & imaging results that were available during my care of the patient were reviewed by me and considered in my medical decision making (see chart for details).    MDM Rules/Calculators/A&P                          Foley catheter placed with 1 L of urine obtained. Concern for cauda equina.  Discussed with Dr. Aris Everts, on call for Dr. Redmond Pulling with [facility] - advised transfer to [facility] for MRI and further evaluation.  He does not want any treatments here prior to transfer.  Discussed with pt and wife and are agreeable to plan.  Offered pain medicine which he deferred.       Final Clinical Impression(s) / ED Diagnoses Final diagnoses:  Cauda equina syndrome [facility])  History of lumbar laminectomy    Rx / DC Orders ED Discharge Orders    None       Landis Martins 07/03/20 1543    Milton Ferguson, MD 07/03/20 2259

## 2020-07-03 NOTE — ED Notes (Signed)
Spouse called pt has left for [facility]

## 2020-07-07 ENCOUNTER — Telehealth: Payer: Self-pay | Admitting: Internal Medicine

## 2020-07-07 MED ORDER — HYDROCORTISONE (PERIANAL) 2.5 % EX CREA: 1.0000 "application " | TOPICAL_CREAM | Freq: Two times a day (BID) | CUTANEOUS | 1 refills | Status: DC

## 2020-07-07 NOTE — Telephone Encounter (Signed)
Patient notified  rx sent to [facility]

## 2020-07-07 NOTE — Telephone Encounter (Signed)
Pt is requesting a call back from a nurse to discuss his development of external hemorrhoids which is making it difficult when he experiences a bowel movement.

## 2020-07-07 NOTE — Telephone Encounter (Signed)
I pended HC cream Rx - double check which pharmacy  Please cc the note to Dr. Crosby Oyster also

## 2020-07-07 NOTE — Telephone Encounter (Signed)
Patient reports recent back surgery and has several external hemorrhoids that are inflamed and bleeding.  We discussed SITZ baths, Recticare PRN, and TUCS pads.  His stools are soft, he has Miralax as needed if he develops constipation. He has had some recent post op complications and has sciatica and is on oral steroids currently. He also has genital swelling that has caused some additional discomfort.  He will try and perform SITZ baths BID for at least 10 minutes.  Recticare PRN.  Okay to send in hydrocortisone cream or protofoam?? He asks that we send a copy of this note to his surgeon Grayland Ormond.

## 2020-07-21 ENCOUNTER — Ambulatory Visit (INDEPENDENT_AMBULATORY_CARE_PROVIDER_SITE_OTHER): Payer: Medicare PPO | Admitting: Urology

## 2020-07-21 ENCOUNTER — Other Ambulatory Visit: Payer: Self-pay

## 2020-07-21 ENCOUNTER — Encounter: Payer: Self-pay | Admitting: Urology

## 2020-07-21 VITALS — BP 119/81 | HR 94 | Temp 98.0°F | Ht 74.0 in | Wt 180.0 lb

## 2020-07-21 DIAGNOSIS — N138 Other obstructive and reflux uropathy: Secondary | ICD-10-CM | POA: Diagnosis not present

## 2020-07-21 DIAGNOSIS — N401 Enlarged prostate with lower urinary tract symptoms: Secondary | ICD-10-CM

## 2020-07-21 DIAGNOSIS — R339 Retention of urine, unspecified: Secondary | ICD-10-CM

## 2020-07-21 MED ORDER — TAMSULOSIN HCL 0.4 MG PO CAPS: 0.4000 mg | ORAL_CAPSULE | Freq: Every day | ORAL | 3 refills | Status: DC

## 2020-07-21 NOTE — Patient Instructions (Signed)
Acute Urinary Retention, Male  Acute urinary retention is a condition in which a person is unable to pass urine. This can last for a short time or for a long time. If left untreated, it can result in kidney damage or other serious complications. What are the causes? This condition may be caused by:  Obstruction or narrowing of the tube that drains the bladder (urethra). This may be caused by surgery or problems with nearby organs, such as the prostate gland, which can press or squeeze the urethra.  Problems with the nerves in the bladder. These can be caused by diseases, such as multiple sclerosis, or by spinal cord injuries.  Certain medicines.  Tumors in the area of the pelvis, bladder, or urethra.  Diabetes.  Degenerative cognitive conditions such as delirium or dementia.  Bladder or urinary tract infection.  Constipation.  Blood in the urine (hematuria).  Injury to the bladder or urethra.  Psychological (psychogenic) conditions. Someone may hold his urine due to trauma or because he does not want to use the bathroom. What increases the risk? This condition is more likely to develop in older men. As men age, their prostate may become larger and may start pressing or squeezing on the bladder or the urethra. What are the signs or symptoms? Symptoms of this condition include:  Trouble urinating.  Pain in the lower abdomen. Symptoms usually come on slowly over a long period of time. How is this diagnosed? This condition is diagnosed based on a physical exam and a medical history. You may also have other tests, including:  An ultrasound of the bladder or kidneys or both.  Blood tests.  A urine analysis.  Additional tests may be needed such as an MRI, kidney, or bladder function tests. How is this treated? Treatment for this condition may include:  Medicines.  Placing a thin, sterile tube (catheter) into the bladder to drain urine out of the body. This is called an  indwelling urinary catheter. After being inserted, the catheter is held in place with a small balloon that is filled with sterile water. Urine drains from the catheter into a collection bag outside of the body.  Behavioral therapy.  Treatment for any underlying conditions.  If needed, you may be treated in the hospital for kidney function problems or to manage other complications. Follow these instructions at home:  Take over-the-counter and prescription medicines only as told by your health care provider. Avoid certain medicines, such as decongestants, antihistamines, and some prescription medicines. Do not take any medicine unless your health care provider has approved.  If you were given an indwelling urinary catheter, take care of it as told by your health care provider.  Drink enough fluid to keep your urine clear or pale yellow.  If you were prescribed an antibiotic, take it as told by your health care provider. Do not stop taking the antibiotic even if you start to feel better.  Do not use any products that contain nicotine or tobacco, such as cigarettes and e-cigarettes. If you need help quitting, ask your health care provider.  Monitor any changes in your symptoms. Tell your health care provider about any changes.  If instructed, monitor your blood pressure at home. Report changes as told by your health care provider.  Keep all follow-up visits as told by your health care provider. This is important. Contact a health care provider if:  You have uncomfortable bladder contractions that you cannot control (spasms) or you leak urine with the spasms.  Get help right away if:  You have chills or fever.  You have blood in your urine.  You have a catheter and: ? Your catheter stops draining urine. ? Your catheter falls out. Summary  Acute urinary retention is a condition in which a person is unable to pass urine. If left untreated, it can result in kidney damage or other serious  complications.  The cause of this condition may include an enlarged prostate. As men age, their prostate gland may become larger and may start pressing or squeezing on the bladder or the urethra.  Treatment for this condition may include medicines and placement of an indwelling urinary catheter.  Monitor any changes in your symptoms. Tell your health care provider about any changes. This information is not intended to replace advice given to you by your health care provider. Make sure you discuss any questions you have with your health care provider. Document Revised: 06/14/2017 Document Reviewed: 08/03/2016 Elsevier Patient Education  [place].

## 2020-07-21 NOTE — Progress Notes (Unsigned)
07/21/2020 9:41 AM   Russell [name] 08-14-1945 [id number]  Referring provider: Marrian Salvage, [address] [place],  [place] [id number]  Urinary retention  HPI: [name] is a 75yo here for followup for BPh and urinary retention. He underwent laminectomy with Dr. Redmond Pulling at [facility] in Nov 2021 and was doing well until mid December when he developed an inability to urinate. He presented to the ER on 12/19 and had a foley catheter placed. 1L drained. He was then tranferred to [facility] and received steroid treatment which he then finished yesterday. He failed a voiding trial at [facility]. He is currently on flomax 0.4mg  daily. He did increase the flomax to BID after his laminectomy which initially helped his to urinate post op. Currently his left buttock and thigh is numb. He has decreased sensation when he has a bowel movement.    PMH: Past Medical History:  Diagnosis Date  . Anemia 2019  . Arthritis   . Bleeding internal hemorrhoids 09/10/2006   Qualifier: Diagnosis of  By: Jonna Munro MD, Roderic Scarce    . 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[place]) 2011  . 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 Cervical spine degeneration   . Constipation   . GERD (gastroesophageal reflux disease)   . Hemorrhoids   . Hx of colonic polyp 05/29/2018   05/2018 cecal ssp  . Kidney stone    Remote  . Maxillary sinus polyp   . Prolapsed internal hemorrhoids, grade 3 09/10/2006   Qualifier: Diagnosis of  By: Jonna Munro MD, Roderic Scarce    . Salivary gland enlargement   . Sleep apnea 10 years ago   declined CPAP  . Stroke ([place]) 15-20 years ago    Surgical History: Past Surgical History:  Procedure Laterality Date  . COLONOSCOPY    . HEMORRHOID BANDING    . LUMBAR LAMINECTOMY  03/08/2010   L2-3, cauda equina syndrome  . UPPER GI ENDOSCOPY      Home Medications:  Allergies as of 07/21/2020   No Known Allergies     Medication List       Accurate as of July 21, 2020  9:41 AM. If you have any questions, ask your nurse or  doctor.        aspirin 81 MG EC tablet Take by mouth.   cholecalciferol 25 MCG (1000 UNIT) tablet Commonly known as: VITAMIN D3 Take 1,000 Units by mouth daily.   Cholecalciferol 50 MCG (2000 UT) Caps Take 1 capsule by mouth daily.   cyclobenzaprine 10 MG tablet Commonly known as: FLEXERIL Take 10 mg by mouth 3 (three) times daily as needed.   Fish Oil 1200 MG Caps Take 1 capsule by mouth 2 (two) times daily.   gabapentin 300 MG capsule Commonly known as: NEURONTIN Take 300 mg by mouth 3 (three) times daily.   hydrocortisone 2.5 % rectal cream Commonly known as: ANUSOL-HC Place 1 application rectally 2 (two) times daily.   IRON PO Take 50 mg by mouth daily.   magnesium 30 MG tablet Take 30 mg by mouth 2 (two) times daily.   MAGNESIUM CHLORIDE PO Take 1 tablet by mouth daily.   methocarbamol 500 MG tablet Commonly known as: ROBAXIN Take 500 mg by mouth 3 (three) times daily.   nitrofurantoin (macrocrystal-monohydrate) 100 MG capsule Commonly known as: MACROBID Take 1 capsule (100 mg total) by mouth 2 (two) times daily.   omeprazole 40 MG capsule Commonly known as: PRILOSEC Take by mouth.   omeprazole 40 MG capsule Commonly known  as: PRILOSEC TAKE ONE CAPSULE BY MOUTH DAILY   polyethylene glycol powder 17 GM/SCOOP powder Commonly known as: GLYCOLAX/MIRALAX Take by mouth.   pyridOXINE 50 MG tablet Commonly known as: VITAMIN B-6 Take 50 mg by mouth daily.   senna-docusate 8.6-50 MG tablet Commonly known as: Senokot-S Take 2 tablets by mouth daily.   Soolantra 1 % Crea Generic drug: Ivermectin as needed.   tamsulosin 0.4 MG Caps capsule Commonly known as: FLOMAX Take 1 capsule (0.4 mg total) by mouth daily after supper.   TURMERIC PO Take 500 mg by mouth daily.   VITAMIN B-12 PO Take 1,000 mcg by mouth daily.   cyanocobalamin 1000 MCG tablet Take by mouth.   vitamin C 500 MG tablet Commonly known as: ASCORBIC ACID Take 500 mg by mouth  daily.       Allergies: No Known Allergies  Family History: Family History  Problem Relation Age of Onset  . Aortic aneurysm Mother   . Heart attack Father   . Aortic aneurysm Sister   . CVA Sister   . Colon cancer Neg Hx   . Pancreatic cancer Neg Hx   . Rectal cancer Neg Hx   . Stomach cancer Neg Hx     Social History:  reports that he quit smoking about 45 years ago. His smoking use included cigarettes. He started smoking about 54 years ago. He smoked 0.25 packs per day. He has never used smokeless tobacco. He reports previous alcohol use. He reports that he does not use drugs.  ROS: All other review of systems were reviewed and are negative except what is noted above in HPI  Physical Exam: BP 119/81   Pulse 94   Temp 98 F (36.7 C)   Ht 6\' 2"  (1.88 m)   Wt 180 lb (81.6 kg)   BMI 23.11 kg/m   Constitutional:  Alert and oriented, No acute distress. HEENT: [place] AT, moist mucus membranes.  Trachea midline, no masses. Cardiovascular: No clubbing, cyanosis, or edema. Respiratory: Normal respiratory effort, no increased work of breathing. GI: Abdomen is soft, nontender, nondistended, no abdominal masses GU: No CVA tenderness.  Lymph: No cervical or inguinal lymphadenopathy. Skin: No rashes, bruises or suspicious lesions. Neurologic: Grossly intact, no focal deficits, moving all 4 extremities. Psychiatric: Normal mood and affect.  Laboratory Data: Lab Results  Component Value Date   WBC 5.9 09/28/2019   HGB 12.7 (L) 09/28/2019   HCT 39.3 09/28/2019   MCV 79.2 09/28/2019   PLT 207.0 09/28/2019    Lab Results  Component Value Date   CREATININE 0.69 09/28/2019    Lab Results  Component Value Date   PSA 1.95 09/28/2019   PSA 1.10 02/10/2009    Lab Results  Component Value Date   TESTOSTERONE 467.03 09/12/2006    Lab Results  Component Value Date   HGBA1C 5.6 09/29/2019    Urinalysis    Component Value Date/Time   COLORURINE YELLOW 03/07/2010 2315    APPEARANCEUR Clear 05/11/2020 0849   LABSPEC 1.021 03/07/2010 2315   PHURINE 6.0 03/07/2010 2315   GLUCOSEU Negative 05/11/2020 0849   HGBUR NEGATIVE 03/07/2010 2315   BILIRUBINUR Negative 05/11/2020 0849   KETONESUR NEGATIVE 03/07/2010 2315   PROTEINUR Negative 05/11/2020 0849   PROTEINUR NEGATIVE 03/07/2010 2315   UROBILINOGEN 1.0 03/20/2018 1406   UROBILINOGEN 0.2 03/07/2010 2315   NITRITE Negative 05/11/2020 0849   NITRITE NEGATIVE 03/07/2010 2315   LEUKOCYTESUR Trace (A) 05/11/2020 0849    Lab Results  Component Value  Date   LABMICR See below: 05/11/2020   WBCUA 0-5 05/11/2020   LABEPIT None seen 05/11/2020   BACTERIA None seen 05/11/2020    Pertinent Imaging:  No results found for this or any previous visit.  No results found for this or any previous visit.  No results found for this or any previous visit.  No results found for this or any previous visit.  No results found for this or any previous visit.  No results found for this or any previous visit.  No results found for this or any previous visit.  No results found for this or any previous visit.   Assessment & Plan:    1. Urinary retention -We will change his foley today and he will followup in 1 month for possible voiding trial  2. Benign prostatic hyperplasia with urinary obstruction -Flomax 0.4mg  BID   No follow-ups on file.  Nicolette Bang, MD  [facility] [place]

## 2020-07-21 NOTE — Progress Notes (Signed)
Urological Symptom Review  Patient is experiencing the following symptoms: Get up at night to urinate Trouble starting stream Weak stream   Review of Systems  Gastrointestinal (upper)  : Negative for upper GI symptoms  Gastrointestinal (lower) : Negative for lower GI symptoms  Constitutional : Negative for symptoms  Skin: Negative for skin symptoms  Eyes: Negative for eye symptoms  Ear/Nose/Throat : Negative for Ear/Nose/Throat symptoms  Hematologic/Lymphatic: Negative for Hematologic/Lymphatic symptoms  Cardiovascular : Negative for cardiovascular symptoms  Respiratory : Negative for respiratory symptoms  Endocrine: Negative for endocrine symptoms  Musculoskeletal: Back pain  Neurological: Negative for neurological symptoms  Psychologic: Negative for psychiatric symptoms

## 2020-08-11 ENCOUNTER — Encounter: Payer: Self-pay | Admitting: Family

## 2020-08-12 ENCOUNTER — Other Ambulatory Visit: Payer: Self-pay | Admitting: Family

## 2020-08-12 DIAGNOSIS — R911 Solitary pulmonary nodule: Secondary | ICD-10-CM

## 2020-08-22 ENCOUNTER — Telehealth: Payer: Self-pay | Admitting: Internal Medicine

## 2020-08-22 NOTE — Telephone Encounter (Signed)
Pt is requesting a call back from a nurse to discuss the blood in his stool he has been experiencing since last week, pt would like to know what he can do.

## 2020-08-22 NOTE — Telephone Encounter (Signed)
Spoke with pt and he was constipated for several days and strained with 3 BM's and has seen some BRB. He thinks this is from his internal hem. Discussed with pt that he could try his protofoam cream again. He is going to do this and also states he is not going to have a BM until he feels the urge to go, his is not staining any more. Pt knows to call back if he continues to have problems.

## 2020-08-29 ENCOUNTER — Ambulatory Visit (INDEPENDENT_AMBULATORY_CARE_PROVIDER_SITE_OTHER): Payer: Medicare PPO | Admitting: Urology

## 2020-08-29 ENCOUNTER — Encounter: Payer: Self-pay | Admitting: Urology

## 2020-08-29 ENCOUNTER — Other Ambulatory Visit: Payer: Self-pay

## 2020-08-29 VITALS — BP 133/60 | HR 76 | Temp 98.5°F | Ht 74.0 in | Wt 180.0 lb

## 2020-08-29 DIAGNOSIS — N138 Other obstructive and reflux uropathy: Secondary | ICD-10-CM | POA: Diagnosis not present

## 2020-08-29 DIAGNOSIS — R339 Retention of urine, unspecified: Secondary | ICD-10-CM

## 2020-08-29 DIAGNOSIS — N401 Enlarged prostate with lower urinary tract symptoms: Secondary | ICD-10-CM

## 2020-08-29 NOTE — Patient Instructions (Signed)
Acute Urinary Retention, Male  Acute urinary retention is when a person cannot pee (urinate) at all, or can only pee a little. This can come on all of a sudden. If it is not treated, it can lead to kidney problems or other serious problems. What are the causes?  A problem with the tube that drains the bladder (urethra).  Problems with the nerves in the bladder.  Tumors.  Certain medicines.  An infection.  Having trouble pooping (constipation). What increases the risk? Older men are more at risk because their prostate gland may become larger as they age. Other conditions also can increase risk. These include:  Diseases, such as multiple sclerosis.  Injury to the spinal cord.  Diabetes.  A condition that affects the way the brain works, such as dementia.  Holding back urine due to trauma or because you do not want to use the bathroom. What are the signs or symptoms?  Trouble peeing.  Pain in the lower belly. How is this treated? Treatment for this condition may include:  Medicines.  Placing a thin, germ-free tube (catheter) into the bladder to drain pee out of the body.  Therapy to treat mental health conditions.  Treatment for conditions that may cause this. If needed, you may be treated in the hospital for kidney problems or to manage other problems. Follow these instructions at home: Medicines  Take over-the-counter and prescription medicines only as told by your doctor. Ask your doctor what medicines you should stay away from.  If you were given an antibiotic medicine, take it as told by your doctor. Do not stop taking it, even if you start to feel better. General instructions  Do not smoke or use any products that contain nicotine or tobacco. If you need help quitting, ask your doctor.  Drink enough fluid to keep your pee pale yellow.  If you were sent home with a tube that drains the bladder, take care of it as told by your doctor.  Watch for changes in  your symptoms. Tell your doctor about them.  If told, keep track of changes in your blood pressure at home. Tell your doctor about them.  Keep all follow-up visits. Contact a doctor if:  You have spasms in your bladder that you cannot stop.  You leak pee when you have spasms. Get help right away if:  You have chills or a fever.  You have blood in your pee.  You have a tube that drains pee from the bladder and these things happen: ? The tube stops draining pee. ? The tube falls out. Summary  Acute urinary retention is when you cannot pee at all or you pee too little.  If this condition is not treated, it can lead to kidney problems or other serious problems.  If you were sent home with a tube (catheter) that drains the bladder, take care of it as told by your doctor.  Watch for changes in your symptoms. Tell your doctor about them. This information is not intended to replace advice given to you by your health care provider. Make sure you discuss any questions you have with your health care provider. Document Revised: 03/23/2020 Document Reviewed: 03/23/2020 Elsevier Patient Education  2021 Elsevier Inc.  

## 2020-08-29 NOTE — Progress Notes (Signed)
Urological Symptom Review  Patient is experiencing the following symptoms: Blood in urine   Review of Systems  Gastrointestinal (upper)  : Negative for upper GI symptoms  Gastrointestinal (lower) : Constipation  Constitutional : Negative for symptoms  Skin: Negative for skin symptoms  Eyes: Negative for eye symptoms  Ear/Nose/Throat : Negative for Ear/Nose/Throat symptoms  Hematologic/Lymphatic: Negative for Hematologic/Lymphatic symptoms  Cardiovascular : Negative for cardiovascular symptoms  Respiratory : Negative for respiratory symptoms  Endocrine: Negative for endocrine symptoms  Musculoskeletal: Negative for musculoskeletal symptoms  Neurological: Negative for neurological symptoms  Psychologic: Negative for psychiatric symptoms

## 2020-08-29 NOTE — Progress Notes (Signed)
08/29/2020 2:55 PM   Russell [name] January 04, 1946 [id number]  Referring provider: Marrian Salvage, [address] [place],  [place] [address]  Urinary retention  HPI: Russell [name] is a 75yo here for followup for BPH with urinary retention. He has intermittent constipation which he uses miralax prn. He does have urinary urgency and a feeling as though he is urinating with the foley in place. No hematuria. His mobility of good and he uses a cane. He is on flomax 0.4mg  BID   PMH: Past Medical History:  Diagnosis Date  . Anemia 2019  . Arthritis   . Bleeding internal hemorrhoids 09/10/2006   Qualifier: Diagnosis of  By: Jonna Munro MD, Roderic Scarce    . 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[place]) 2011  . Cerebellar infarct ([place])   . Cervical spine degeneration   . Constipation   . GERD (gastroesophageal reflux disease)   . Hemorrhoids   . Hx of colonic polyp 05/29/2018   05/2018 cecal ssp  . Kidney stone    Remote  . Maxillary sinus polyp   . Prolapsed internal hemorrhoids, grade 3 09/10/2006   Qualifier: Diagnosis of  By: Jonna Munro MD, Roderic Scarce    . Salivary gland enlargement   . Sleep apnea 10 years ago   declined CPAP  . Stroke ([place]) 15-20 years ago    Surgical History: Past Surgical History:  Procedure Laterality Date  . COLONOSCOPY    . HEMORRHOID BANDING    . LUMBAR LAMINECTOMY  03/08/2010   L2-3, cauda equina syndrome  . UPPER GI ENDOSCOPY      Home Medications:  Allergies as of 08/29/2020   No Known Allergies     Medication List       Accurate as of August 29, 2020  2:55 PM. If you have any questions, ask your nurse or doctor.        STOP taking these medications   cyclobenzaprine 10 MG tablet Commonly known as: FLEXERIL Stopped by: Nicolette Bang, MD   nitrofurantoin (macrocrystal-monohydrate) 100 MG capsule Commonly known as: MACROBID Stopped by: Nicolette Bang, MD     TAKE these medications   aspirin 81 MG EC tablet Take by mouth.    cholecalciferol 25 MCG (1000 UNIT) tablet Commonly known as: VITAMIN D3 Take 1,000 Units by mouth daily.   Cholecalciferol 50 MCG (2000 UT) Caps Take 1 capsule by mouth daily.   Fish Oil 1200 MG Caps Take 1 capsule by mouth 2 (two) times daily.   gabapentin 300 MG capsule Commonly known as: NEURONTIN Take 300 mg by mouth 3 (three) times daily.   hydrocortisone 2.5 % rectal cream Commonly known as: ANUSOL-HC Place 1 application rectally 2 (two) times daily.   IRON PO Take 50 mg by mouth daily.   magnesium 30 MG tablet Take 30 mg by mouth 2 (two) times daily.   MAGNESIUM CHLORIDE PO Take 1 tablet by mouth daily.   methocarbamol 500 MG tablet Commonly known as: ROBAXIN Take 500 mg by mouth 3 (three) times daily.   omeprazole 40 MG capsule Commonly known as: PRILOSEC Take by mouth.   omeprazole 40 MG capsule Commonly known as: PRILOSEC TAKE ONE CAPSULE BY MOUTH DAILY   polyethylene glycol powder 17 GM/SCOOP powder Commonly known as: GLYCOLAX/MIRALAX Take by mouth.   pyridOXINE 50 MG tablet Commonly known as: VITAMIN B-6 Take 50 mg by mouth daily.   senna-docusate 8.6-50 MG tablet Commonly known as: Senokot-S Take 2 tablets by mouth daily.   Soolantra 1 % Crea  Generic drug: Ivermectin as needed.   tamsulosin 0.4 MG Caps capsule Commonly known as: FLOMAX Take 1 capsule (0.4 mg total) by mouth daily after supper.   TURMERIC PO Take 500 mg by mouth daily.   VITAMIN B-12 PO Take 1,000 mcg by mouth daily. What changed: Another medication with the same name was removed. Continue taking this medication, and follow the directions you see here. Changed by: Nicolette Bang, MD   vitamin C 500 MG tablet Commonly known as: ASCORBIC ACID Take 500 mg by mouth daily.       Allergies: No Known Allergies  Family History: Family History  Problem Relation Age of Onset  . Aortic aneurysm Mother   . Heart attack Father   . Aortic aneurysm Sister   . CVA  Sister   . Colon cancer Neg Hx   . Pancreatic cancer Neg Hx   . Rectal cancer Neg Hx   . Stomach cancer Neg Hx     Social History:  reports that he quit smoking about 45 years ago. His smoking use included cigarettes. He started smoking about 54 years ago. He smoked 0.25 packs per day. He has never used smokeless tobacco. He reports previous alcohol use. He reports that he does not use drugs.  ROS: All other review of systems were reviewed and are negative except what is noted above in HPI  Physical Exam: BP 133/60   Pulse 76   Temp 98.5 F (36.9 C)   Ht 6\' 2"  (1.88 m)   Wt 180 lb (81.6 kg)   BMI 23.11 kg/m   Constitutional:  Alert and oriented, No acute distress. HEENT: [place] AT, moist mucus membranes.  Trachea midline, no masses. Cardiovascular: No clubbing, cyanosis, or edema. Respiratory: Normal respiratory effort, no increased work of breathing. GI: Abdomen is soft, nontender, nondistended, no abdominal masses GU: No CVA tenderness.  Lymph: No cervical or inguinal lymphadenopathy. Skin: No rashes, bruises or suspicious lesions. Neurologic: Grossly intact, no focal deficits, moving all 4 extremities. Psychiatric: Normal mood and affect.  Laboratory Data: Lab Results  Component Value Date   WBC 5.9 09/28/2019   HGB 12.7 (L) 09/28/2019   HCT 39.3 09/28/2019   MCV 79.2 09/28/2019   PLT 207.0 09/28/2019    Lab Results  Component Value Date   CREATININE 0.69 09/28/2019    Lab Results  Component Value Date   PSA 1.95 09/28/2019   PSA 1.10 02/10/2009    Lab Results  Component Value Date   TESTOSTERONE 467.03 09/12/2006    Lab Results  Component Value Date   HGBA1C 5.6 09/29/2019    Urinalysis    Component Value Date/Time   COLORURINE YELLOW 03/07/2010 2315   APPEARANCEUR Clear 05/11/2020 0849   LABSPEC 1.021 03/07/2010 2315   PHURINE 6.0 03/07/2010 2315   GLUCOSEU Negative 05/11/2020 0849   HGBUR NEGATIVE 03/07/2010 2315   BILIRUBINUR Negative  05/11/2020 0849   KETONESUR NEGATIVE 03/07/2010 2315   PROTEINUR Negative 05/11/2020 0849   PROTEINUR NEGATIVE 03/07/2010 2315   UROBILINOGEN 1.0 03/20/2018 1406   UROBILINOGEN 0.2 03/07/2010 2315   NITRITE Negative 05/11/2020 0849   NITRITE NEGATIVE 03/07/2010 2315   LEUKOCYTESUR Trace (A) 05/11/2020 0849    Lab Results  Component Value Date   LABMICR See below: 05/11/2020   WBCUA 0-5 05/11/2020   LABEPIT None seen 05/11/2020   BACTERIA None seen 05/11/2020    Pertinent Imaging:  No results found for this or any previous visit.  No results found for this  or any previous visit.  No results found for this or any previous visit.  No results found for this or any previous visit.  No results found for this or any previous visit.  No results found for this or any previous visit.  No results found for this or any previous visit.  No results found for this or any previous visit.   Assessment & Plan:    1. Benign prostatic hyperplasia with urinary obstruction -continue flomax 0.4mg  BID  2. Urinary retention RTC Thursday morning for a voiding trial.   No follow-ups on file.  Nicolette Bang, MD  [facility] [place]

## 2020-08-30 ENCOUNTER — Ambulatory Visit
Admission: RE | Admit: 2020-08-30 | Discharge: 2020-08-30 | Disposition: A | Discharge status: Home / Self Care | Payer: Medicare PPO | Source: Ambulatory Visit | Attending: Family | Admitting: Family

## 2020-08-30 DIAGNOSIS — R911 Solitary pulmonary nodule: Secondary | ICD-10-CM

## 2020-09-01 ENCOUNTER — Other Ambulatory Visit: Payer: Self-pay

## 2020-09-01 ENCOUNTER — Ambulatory Visit (INDEPENDENT_AMBULATORY_CARE_PROVIDER_SITE_OTHER): Payer: Medicare PPO

## 2020-09-01 DIAGNOSIS — N401 Enlarged prostate with lower urinary tract symptoms: Secondary | ICD-10-CM

## 2020-09-01 DIAGNOSIS — N138 Other obstructive and reflux uropathy: Secondary | ICD-10-CM | POA: Diagnosis not present

## 2020-09-01 LAB — URINALYSIS, ROUTINE W REFLEX MICROSCOPIC
Bilirubin, UA: NEGATIVE
Glucose, UA: NEGATIVE
Ketones, UA: NEGATIVE
Nitrite, UA: NEGATIVE
Specific Gravity, UA: 1.01 (ref 1.005–1.030)
Urobilinogen, Ur: 2 mg/dL — ABNORMAL HIGH (ref 0.2–1.0)
pH, UA: 7 (ref 5.0–7.5)

## 2020-09-01 LAB — MICROSCOPIC EXAMINATION
Epithelial Cells (non renal): >10 /hpf — AB (ref 0–10)
Renal Epithel, UA: NONE SEEN /hpf
WBC, UA: >30 /hpf — AB (ref 0–5)

## 2020-09-01 MED ORDER — SULFAMETHOXAZOLE-TRIMETHOPRIM 800-160 MG PO TABS: 1.0000 | ORAL_TABLET | Freq: Two times a day (BID) | ORAL | 0 refills | Status: DC

## 2020-09-01 NOTE — Progress Notes (Signed)
Fill and Pull Catheter Removal  Patient is present today for a catheter removal.  Patient was cleaned and prepped in a sterile fashion 150 ml of sterile water/ saline was instilled into the bladder when the patient felt the urge to urinate. 45ml of water was then drained from the balloon.  A 16FR foley cath was removed from the bladder no complications were noted .  Patient as then given some time to void on their own.  Patient can void  114ml on their own after some time.  Patient tolerated well.  Performed by: Dontea Corlew, lpn  Follow up/ Additional notes: Patient request to return this afternoon to drop off a urine specimen due to dysuria and foul smelling urine.   Patient returned to drop off urine specimen. Urine dip results looked at by Dr. Jeffie Pollock and patient started on antibiotic.  Bladder Scan Patient can void: 210 ml Performed By: Durenda Guthrie, lpn

## 2020-09-02 ENCOUNTER — Other Ambulatory Visit: Payer: Self-pay | Admitting: Family

## 2020-09-02 DIAGNOSIS — I739 Peripheral vascular disease, unspecified: Secondary | ICD-10-CM

## 2020-09-02 NOTE — Progress Notes (Signed)
  Referred to pulmonology;

## 2020-09-04 LAB — URINE CULTURE

## 2020-09-05 ENCOUNTER — Telehealth: Payer: Self-pay

## 2020-09-05 NOTE — Telephone Encounter (Signed)
Sent in error

## 2020-09-06 ENCOUNTER — Other Ambulatory Visit: Payer: Self-pay

## 2020-09-06 DIAGNOSIS — N401 Enlarged prostate with lower urinary tract symptoms: Secondary | ICD-10-CM

## 2020-09-06 DIAGNOSIS — N138 Other obstructive and reflux uropathy: Secondary | ICD-10-CM

## 2020-09-06 MED ORDER — NITROFURANTOIN MONOHYD MACRO 100 MG PO CAPS
100.0000 mg | ORAL_CAPSULE | Freq: Two times a day (BID) | ORAL | 0 refills | Status: DC
Start: 2020-09-06 — End: 2020-10-03

## 2020-09-06 NOTE — Telephone Encounter (Signed)
Macrobid 100mg  BID for 7 days

## 2020-09-06 NOTE — Telephone Encounter (Signed)
Pt  Notified new antibiotic sent in for him.

## 2020-09-08 ENCOUNTER — Other Ambulatory Visit: Payer: Self-pay

## 2020-09-08 DIAGNOSIS — N401 Enlarged prostate with lower urinary tract symptoms: Secondary | ICD-10-CM

## 2020-09-08 DIAGNOSIS — N138 Other obstructive and reflux uropathy: Secondary | ICD-10-CM

## 2020-09-08 MED ORDER — TAMSULOSIN HCL 0.4 MG PO CAPS: 0.4000 mg | ORAL_CAPSULE | Freq: Two times a day (BID) | ORAL | 3 refills | Status: DC

## 2020-09-14 ENCOUNTER — Other Ambulatory Visit: Payer: Medicare PPO

## 2020-09-14 ENCOUNTER — Other Ambulatory Visit: Payer: Self-pay

## 2020-09-14 ENCOUNTER — Telehealth: Payer: Self-pay

## 2020-09-14 DIAGNOSIS — N390 Urinary tract infection, site not specified: Secondary | ICD-10-CM

## 2020-09-14 NOTE — Telephone Encounter (Signed)
Pt called wanting to leave a urine specimen to be checked to see if infection had cleared up. Scheduled him for specimen drop off today. Then pt called back and talked with A. L. RN and said he had just gotten a covid test and was rescheduled for Friday.

## 2020-09-15 ENCOUNTER — Telehealth: Payer: Self-pay | Admitting: Family

## 2020-09-15 NOTE — Telephone Encounter (Signed)
3/22 @ 3:15 w/ Melissa Noon and spoke with pt and he is ok with the appt with JD.  Nothing further is needed.

## 2020-09-16 ENCOUNTER — Other Ambulatory Visit: Payer: Medicare PPO

## 2020-09-16 ENCOUNTER — Other Ambulatory Visit: Payer: Self-pay

## 2020-09-16 LAB — URINALYSIS, ROUTINE W REFLEX MICROSCOPIC
Bilirubin, UA: NEGATIVE
Glucose, UA: NEGATIVE
Ketones, UA: NEGATIVE
Leukocytes,UA: NEGATIVE
Nitrite, UA: NEGATIVE
Protein,UA: NEGATIVE
RBC, UA: NEGATIVE
Specific Gravity, UA: 1.025 (ref 1.005–1.030)
Urobilinogen, Ur: 1 mg/dL (ref 0.2–1.0)
pH, UA: 5.5 (ref 5.0–7.5)

## 2020-09-20 LAB — CULTURE, URINE COMPREHENSIVE

## 2020-09-22 NOTE — Progress Notes (Signed)
Sent via mychart

## 2020-09-28 ENCOUNTER — Other Ambulatory Visit: Payer: Self-pay

## 2020-09-28 ENCOUNTER — Encounter: Payer: Self-pay | Admitting: Urology

## 2020-09-28 ENCOUNTER — Ambulatory Visit (INDEPENDENT_AMBULATORY_CARE_PROVIDER_SITE_OTHER): Payer: Medicare PPO | Admitting: Urology

## 2020-09-28 VITALS — BP 135/84 | HR 80 | Temp 98.9°F | Ht 74.0 in | Wt 180.0 lb

## 2020-09-28 DIAGNOSIS — R339 Retention of urine, unspecified: Secondary | ICD-10-CM | POA: Diagnosis not present

## 2020-09-28 DIAGNOSIS — N401 Enlarged prostate with lower urinary tract symptoms: Secondary | ICD-10-CM

## 2020-09-28 DIAGNOSIS — N5201 Erectile dysfunction due to arterial insufficiency: Secondary | ICD-10-CM | POA: Diagnosis not present

## 2020-09-28 DIAGNOSIS — N138 Other obstructive and reflux uropathy: Secondary | ICD-10-CM

## 2020-09-28 DIAGNOSIS — N390 Urinary tract infection, site not specified: Secondary | ICD-10-CM

## 2020-09-28 LAB — URINALYSIS, ROUTINE W REFLEX MICROSCOPIC
Bilirubin, UA: NEGATIVE
Glucose, UA: NEGATIVE
Ketones, UA: NEGATIVE
Nitrite, UA: NEGATIVE
Protein,UA: NEGATIVE
RBC, UA: NEGATIVE
Specific Gravity, UA: 1.02 (ref 1.005–1.030)
Urobilinogen, Ur: 2 mg/dL — ABNORMAL HIGH (ref 0.2–1.0)
pH, UA: 7 (ref 5.0–7.5)

## 2020-09-28 LAB — MICROSCOPIC EXAMINATION
Bacteria, UA: NONE SEEN
Epithelial Cells (non renal): NONE SEEN /hpf (ref 0–10)
RBC, Urine: NONE SEEN /hpf (ref 0–2)
Renal Epithel, UA: NONE SEEN /hpf

## 2020-09-28 MED ORDER — SILODOSIN 8 MG PO CAPS: 8.0000 mg | ORAL_CAPSULE | Freq: Every day | ORAL | 11 refills | Status: DC

## 2020-09-28 NOTE — Progress Notes (Signed)
09/28/2020 3:31 PM   [name] 01-Jun-1946 [id number]  Referring provider: Marrian Salvage, [address] [place],  [place] [address]  followup urinary retention  HPI: Mr Hughett is a 75yo here for followup urinary retention and BPH. He is currently on Flomax 0.4mg  BID IPSS 17 with QOL 3. Nocturia 1x. His biggest complaint is weak urinary stream and starting/stopping of his stream. The constipation is improving.  PVR 138cc.  He has had issues getting an erection for 15 years. No prior PDE5s. He uses a VED with good results.  Good exercise tolerance. 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    PMH: Past Medical History:  Diagnosis Date  . Anemia 2019  . Arthritis   . Bleeding internal hemorrhoids 09/10/2006   Qualifier: Diagnosis of  By: Jonna Munro MD, Roderic Scarce    . 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[place]) 2011  . Cerebellar infarct ([place])   . Cervical spine degeneration   . Constipation   . GERD (gastroesophageal reflux disease)   . Hemorrhoids   . Hx of colonic polyp 05/29/2018   05/2018 cecal ssp  . Kidney stone    Remote  . Maxillary sinus polyp   . Prolapsed internal hemorrhoids, grade 3 09/10/2006   Qualifier: Diagnosis of  By: Jonna Munro MD, Roderic Scarce    . Salivary gland enlargement   . Sleep apnea 10 years ago   declined CPAP  . Stroke ([place]) 15-20 years ago    Surgical History: Past Surgical History:  Procedure Laterality Date  . COLONOSCOPY    . HEMORRHOID BANDING    . LUMBAR LAMINECTOMY  03/08/2010   L2-3, cauda equina syndrome  . UPPER GI ENDOSCOPY      Home Medications:  Allergies as of 09/28/2020   No Known Allergies     Medication List       Accurate as of September 28, 2020  3:31 PM. If you have any questions, ask your nurse or doctor.        aspirin 81 MG EC tablet Take by mouth.   cholecalciferol 25 MCG (1000 UNIT) tablet Commonly known as: VITAMIN D3 Take 1,000 Units by mouth daily.    Cholecalciferol 50 MCG (2000 UT) Caps Take 1 capsule by mouth daily.   Fish Oil 1200 MG Caps Take 1 capsule by mouth 2 (two) times daily.   gabapentin 300 MG capsule Commonly known as: NEURONTIN Take 300 mg by mouth 3 (three) times daily.   hydrocortisone 2.5 % rectal cream Commonly known as: ANUSOL-HC Place 1 application rectally 2 (two) times daily.   IRON PO Take 50 mg by mouth daily.   magnesium 30 MG tablet Take 30 mg by mouth 2 (two) times daily.   MAGNESIUM CHLORIDE PO Take 1 tablet by mouth daily.   methocarbamol 500 MG tablet Commonly known as: ROBAXIN Take 500 mg by mouth 3 (three) times daily.   nitrofurantoin (macrocrystal-monohydrate) 100 MG capsule Commonly known as: MACROBID Take 1 capsule (100 mg total) by mouth every 12 (twelve) hours.   omeprazole 40 MG capsule Commonly known as: PRILOSEC Take by mouth.   omeprazole 40 MG capsule Commonly known as: PRILOSEC TAKE ONE CAPSULE BY MOUTH DAILY   polyethylene glycol powder 17 GM/SCOOP powder Commonly known as: GLYCOLAX/MIRALAX Take by mouth.   pyridOXINE 50 MG tablet Commonly known as: VITAMIN B-6 Take 50 mg by mouth daily.   senna-docusate 8.6-50 MG tablet Commonly known as: Senokot-S Take 2 tablets by mouth  daily.   Soolantra 1 % Crea Generic drug: Ivermectin as needed.   sulfamethoxazole-trimethoprim 800-160 MG tablet Commonly known as: BACTRIM DS Take 1 tablet by mouth 2 (two) times daily.   tamsulosin 0.4 MG Caps capsule Commonly known as: FLOMAX Take 1 capsule (0.4 mg total) by mouth in the morning and at bedtime.   TURMERIC PO Take 500 mg by mouth daily.   VITAMIN B-12 PO Take 1,000 mcg by mouth daily.   vitamin C 500 MG tablet Commonly known as: ASCORBIC ACID Take 500 mg by mouth daily.       Allergies: No Known Allergies  Family History: Family History  Problem Relation Age of Onset  . Aortic aneurysm Mother   . Heart attack Father   . Aortic aneurysm Sister    . CVA Sister   . Colon cancer Neg Hx   . Pancreatic cancer Neg Hx   . Rectal cancer Neg Hx   . Stomach cancer Neg Hx     Social History:  reports that he quit smoking about 45 years ago. His smoking use included cigarettes. He started smoking about 54 years ago. He smoked 0.25 packs per day. He has never used smokeless tobacco. He reports previous alcohol use. He reports that he does not use drugs.  ROS: All other review of systems were reviewed and are negative except what is noted above in HPI  Physical Exam: BP 135/84   Pulse 80   Temp 98.9 F (37.2 C)   Ht 6\' 2"  (1.88 m)   Wt 180 lb (81.6 kg)   BMI 23.11 kg/m   Constitutional:  Alert and oriented, No acute distress. HEENT: [place] AT, moist mucus membranes.  Trachea midline, no masses. Cardiovascular: No clubbing, cyanosis, or edema. Respiratory: Normal respiratory effort, no increased work of breathing. GI: Abdomen is soft, nontender, nondistended, no abdominal masses GU: No CVA tenderness.  Lymph: No cervical or inguinal lymphadenopathy. Skin: No rashes, bruises or suspicious lesions. Neurologic: Grossly intact, no focal deficits, moving all 4 extremities. Psychiatric: Normal mood and affect.  Laboratory Data: Lab Results  Component Value Date   WBC 5.9 09/28/2019   HGB 12.7 (L) 09/28/2019   HCT 39.3 09/28/2019   MCV 79.2 09/28/2019   PLT 207.0 09/28/2019    Lab Results  Component Value Date   CREATININE 0.69 09/28/2019    Lab Results  Component Value Date   PSA 1.95 09/28/2019   PSA 1.10 02/10/2009    Lab Results  Component Value Date   TESTOSTERONE 467.03 09/12/2006    Lab Results  Component Value Date   HGBA1C 5.6 09/29/2019    Urinalysis    Component Value Date/Time   COLORURINE YELLOW 03/07/2010 2315   APPEARANCEUR Clear 09/16/2020 1456   LABSPEC 1.021 03/07/2010 2315   PHURINE 6.0 03/07/2010 2315   GLUCOSEU Negative 09/16/2020 1456   HGBUR NEGATIVE 03/07/2010 2315   BILIRUBINUR Negative  09/16/2020 1456   KETONESUR NEGATIVE 03/07/2010 2315   PROTEINUR Negative 09/16/2020 1456   PROTEINUR NEGATIVE 03/07/2010 2315   UROBILINOGEN 1.0 03/20/2018 1406   UROBILINOGEN 0.2 03/07/2010 2315   NITRITE Negative 09/16/2020 1456   NITRITE NEGATIVE 03/07/2010 2315   LEUKOCYTESUR Negative 09/16/2020 1456    Lab Results  Component Value Date   LABMICR Comment 09/16/2020   WBCUA >30 (A) 09/01/2020   LABEPIT >10 (A) 09/01/2020   BACTERIA Many (A) 09/01/2020    Pertinent Imaging:  No results found for this or any previous visit.  No results  found for this or any previous visit.  No results found for this or any previous visit.  No results found for this or any previous visit.  No results found for this or any previous visit.  No results found for this or any previous visit.  No results found for this or any previous visit.  No results found for this or any previous visit.   Assessment & Plan:    1. Benign prostatic hyperplasia with urinary obstruction -rapaflo 8mg  qhs - BLADDER SCAN AMB NON-IMAGING  2. Urinary retention -rapaflo 8mg  qhs  3. Erectile dysfunction -Patient to continue vacuum erection device.    No follow-ups on file.  Nicolette Bang, MD  [facility] [place]

## 2020-09-28 NOTE — Progress Notes (Signed)
Bladder Scan Patient can void: 138 ml Performed By: Durenda Guthrie, lpn    Urological Symptom Review  Patient is experiencing the following symptoms: Stream starts and stops Weak stream Erection problems (male only)   Review of Systems  Gastrointestinal (upper)  : Negative for upper GI symptoms  Gastrointestinal (lower) : Negative for lower GI symptoms  Constitutional : Negative for symptoms  Skin: Negative for skin symptoms  Eyes: Negative for eye symptoms  Ear/Nose/Throat : Negative for Ear/Nose/Throat symptoms  Hematologic/Lymphatic: Negative for Hematologic/Lymphatic symptoms  Cardiovascular : Leg swelling  Respiratory : Negative for respiratory symptoms  Endocrine: Negative for endocrine symptoms  Musculoskeletal: Negative for musculoskeletal symptoms  Neurological: Headaches Dizziness  Psychologic: Negative for psychiatric symptoms

## 2020-09-28 NOTE — Patient Instructions (Signed)

## 2020-09-29 ENCOUNTER — Encounter: Payer: Self-pay | Admitting: Family

## 2020-09-29 ENCOUNTER — Other Ambulatory Visit: Payer: Self-pay | Admitting: *Deleted

## 2020-09-29 ENCOUNTER — Encounter: Payer: Self-pay | Admitting: Urology

## 2020-09-29 DIAGNOSIS — I739 Peripheral vascular disease, unspecified: Secondary | ICD-10-CM

## 2020-10-03 ENCOUNTER — Encounter: Payer: Self-pay | Admitting: Vascular Surgery

## 2020-10-03 ENCOUNTER — Ambulatory Visit (INDEPENDENT_AMBULATORY_CARE_PROVIDER_SITE_OTHER): Payer: Medicare PPO

## 2020-10-03 ENCOUNTER — Ambulatory Visit: Payer: Medicare PPO | Admitting: Vascular Surgery

## 2020-10-03 ENCOUNTER — Telehealth: Payer: Self-pay

## 2020-10-03 ENCOUNTER — Other Ambulatory Visit: Payer: Self-pay

## 2020-10-03 VITALS — BP 118/76 | HR 72 | Temp 98.2°F | Resp 16 | Ht 74.0 in | Wt 185.0 lb

## 2020-10-03 DIAGNOSIS — I739 Peripheral vascular disease, unspecified: Secondary | ICD-10-CM

## 2020-10-03 DIAGNOSIS — R6889 Other general symptoms and signs: Secondary | ICD-10-CM | POA: Diagnosis not present

## 2020-10-03 NOTE — Progress Notes (Signed)
[facility] [place]  Patient name: Russell [name] MRN: [id number] DOB: 02-23-1946 Sex: male  REASON FOR VISIT: Evaluation lower extremity arterial flow  HPI: Russell [name] is a 75 y.o. male here today for evaluation.  I had seen him for lower extremity arterial evaluation with an office visit in September 2019.  At that time he was having some discoloration in his left foot with some left foot pain.  He has significant degenerative disc disease and has had several back surgeries.  He had spine surgery with good initial result.  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  He has pain with hyper extension.  He reports that he has lost a great deal of muscle tone as well.  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  He does not have any claudication type symptoms.  Past Medical History:  Diagnosis Date  . Anemia 2019  . Arthritis   . Bleeding internal hemorrhoids 09/10/2006   Qualifier: Diagnosis of  By: Jonna Munro MD, Roderic Scarce    . 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[place]) 2011  . Cerebellar infarct ([place])   . Cervical spine degeneration   . Constipation   . GERD (gastroesophageal reflux disease)   . Hemorrhoids   . Hx of colonic polyp 05/29/2018   05/2018 cecal ssp  . Kidney stone    Remote  . Maxillary sinus polyp   . Prolapsed internal hemorrhoids, grade 3 09/10/2006   Qualifier: Diagnosis of  By: Jonna Munro MD, Roderic Scarce    . Salivary gland enlargement   . Sleep apnea 10 years ago   declined CPAP  . Stroke ([place]) 15-20 years ago    Family History  Problem Relation Age of Onset  . Aortic aneurysm Mother   . Heart attack Father   . Aortic aneurysm Sister   . CVA Sister   . Colon cancer Neg Hx   . Pancreatic cancer Neg Hx   . Rectal cancer Neg Hx   . Stomach cancer Neg Hx      SOCIAL HISTORY: Social History   Tobacco Use  . Smoking status: Former Smoker    Packs/day: 0.25    Types: Cigarettes    Start date: 10/15/1965    Quit date: 10/16/1974    Years since quitting: 45.9  . Smokeless tobacco: Never Used  Substance Use Topics  . Alcohol use: Not Currently    Comment: rarely wine    No Known Allergies  Current Outpatient Medications  Medication Sig Dispense Refill  . aspirin 81 MG EC tablet Take by mouth.    . cholecalciferol (VITAMIN D3) 25 MCG (1000 UNIT) tablet Take 1,000 Units by mouth daily.    . Cyanocobalamin (VITAMIN B-12 PO) Take 1,000 mcg by mouth daily.    Marland Kitchen gabapentin (NEURONTIN) 300 MG capsule Take 300 mg by mouth 3 (three) times daily.    . hydrocortisone (ANUSOL-HC) 2.5 % rectal cream Place 1 application rectally 2 (two) times daily. 30 g 1  . IRON PO Take 50 mg by mouth daily.    . magnesium 30 MG tablet Take 30 mg by mouth 2 (two) times daily.    Marland Kitchen MAGNESIUM CHLORIDE PO Take 1 tablet by mouth daily.    . Omega-3 Fatty Acids (FISH OIL) 1200 MG CAPS Take 1 capsule by mouth 2 (two) times daily.    Marland Kitchen  omeprazole (PRILOSEC) 40 MG capsule Take by mouth.    . polyethylene glycol powder (GLYCOLAX/MIRALAX) 17 GM/SCOOP powder Take by mouth.    . pyridOXINE (VITAMIN B-6) 50 MG tablet Take 50 mg by mouth daily.    Marland Kitchen senna-docusate (SENOKOT-S) 8.6-50 MG tablet Take 2 tablets by mouth daily.    . SOOLANTRA 1 % CREA as needed.   5  . tamsulosin (FLOMAX) 0.4 MG CAPS capsule Take 1 capsule (0.4 mg total) by mouth in the morning and at bedtime. 180 capsule 3  . TURMERIC PO Take 500 mg by mouth daily.    . vitamin C (ASCORBIC ACID) 500 MG tablet Take 500 mg by mouth daily.     No current facility-administered medications for this visit.    REVIEW OF SYSTEMS:  [X]  denotes positive finding, [ ]  denotes negative finding Cardiac  Comments:  Chest pain or chest pressure:    Shortness of breath upon exertion:    Short of breath when lying flat:     Irregular heart rhythm:        Vascular    Pain in calf, thigh, or hip brought on by ambulation:    Pain in feet at night that wakes you up from your sleep:     Blood clot in your veins:    Leg swelling:           PHYSICAL EXAM: Vitals:   10/03/20 1247  BP: 118/76  Pulse: 72  Resp: 16  Temp: 98.2 F (36.8 C)  TempSrc: Other (Comment)  SpO2: 98%  Weight: 185 lb (83.9 kg)  Height: 6\' 2"  (1.88 m)    GENERAL: The patient is a well-nourished male, in no acute distress. The vital signs are documented above. CARDIOVASCULAR: 2+ radial pulses bilaterally.  2+ femoral and 2+ posterior tibial pulses bilaterally. PULMONARY: There is good air exchange  MUSCULOSKELETAL: There are no major deformities or cyanosis. NEUROLOGIC: No focal weakness or paresthesias are detected. SKIN: There are no ulcers or rashes noted.  He does have some bluish discoloration in his left foot versus his right PSYCHIATRIC: The patient has a normal affect.  DATA:  Normal triphasic waveforms to the level of his tibial vessels bilaterally.  He does have some probable calcification within his arteries making ankle arm index somewhat unreliable.  MEDICAL ISSUES: No evidence of significant arterial insufficiency.  Feel that his bluish discoloration in his left foot is related to autonomic nerve issues.  Normal pulses and normal triphasic waveforms bilaterally.  He was reassured with this and will continue his physical therapy and recovery from his lumbar spine surgery    Rosetta Posner, MD FACS [facility] [phone]  Note: Portions of this report may have been transcribed using voice recognition software.  Every effort has been made to ensure accuracy; however, inadvertent computerized transcription errors may still be present.

## 2020-10-04 ENCOUNTER — Ambulatory Visit: Payer: Medicare PPO | Admitting: Pulmonary Disease

## 2020-10-04 ENCOUNTER — Encounter: Payer: Self-pay | Admitting: Pulmonary Disease

## 2020-10-04 VITALS — BP 120/82 | HR 74 | Temp 97.9°F | Ht 74.0 in | Wt 188.2 lb

## 2020-10-04 DIAGNOSIS — R911 Solitary pulmonary nodule: Secondary | ICD-10-CM | POA: Diagnosis not present

## 2020-10-04 DIAGNOSIS — J849 Interstitial pulmonary disease, unspecified: Secondary | ICD-10-CM

## 2020-10-04 NOTE — Patient Instructions (Signed)
We will monitor the pulmonary nodule with serial CT Chest scans.   Next scan will be scheduled in 6 months.

## 2020-10-04 NOTE — Progress Notes (Signed)
Synopsis: Referred in March 2022 by [name], NP  Subjective:   PATIENT ID: Russell [name] GENDER: male DOB: [birth year], MRN: [id number]   HPI  Chief Complaint  Patient presents with  . Consult    Patient had CT scan done on 08/30/2020 and they saw a nodule.     [name] is a 75 year old male, former smoker who is referred to pulmonary clinic for evaluation of pulmonary nodule.   He had CT Chest scan on 08/30/20 where an 5mm ground glass nodule was noted in the left lower lobe. He also has notable peripheral reticular markings scattered throughout the lungs bilaterally.  He quit smoking in 1976 and smoked a quarter pack for about 9 years. He is a Brewing technologist by trade and did not previously use a protective mask or respirator when mixing glazes or sanding his pieces. He reports many years or exposure to certain elements in the glaze mixes and fine dust particles.   He was diagnosed with sleep apnea about 15 years ago and does not use CPAP. He reports his night time awakenings are not as significant before. He does not sleep well due to his back issues and chronic leg pain issues.      He denies cough, wheezing or dyspnea.  Past Medical History:  Diagnosis Date  . Anemia 2019  . Arthritis   . Bleeding internal hemorrhoids 09/10/2006   Qualifier: Diagnosis of  By: Jonna Munro MD, Roderic Scarce    . 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[place]) 2011  . Cerebellar infarct ([place])   . Cervical spine degeneration   . Constipation   . GERD (gastroesophageal reflux disease)   . Hemorrhoids   . Hx of colonic polyp 05/29/2018   05/2018 cecal ssp  . Kidney stone    Remote  . Maxillary sinus polyp   . Prolapsed internal hemorrhoids, grade 3 09/10/2006   Qualifier: Diagnosis of  By: Jonna Munro MD, Roderic Scarce    . Salivary gland enlargement   . Sleep apnea 10 years ago   declined CPAP  . Stroke ([place]) 15-20 years ago     Family History  Problem Relation Age of Onset  . Aortic aneurysm Mother   . Heart  attack Father   . Aortic aneurysm Sister   . CVA Sister   . Colon cancer Neg Hx   . Pancreatic cancer Neg Hx   . Rectal cancer Neg Hx   . Stomach cancer Neg Hx      Social History   Socioeconomic History  . Marital status: Married    Spouse name: Not on file  . Number of children: Not on file  . Years of education: Not on file  . Highest education level: Not on file  Occupational History  . Occupation: retired  Tobacco Use  . Smoking status: Former Smoker    Packs/day: 0.25    Types: Cigarettes    Start date: 10/15/1965    Quit date: 10/16/1974    Years since quitting: 46.0  . Smokeless tobacco: Never Used  Vaping Use  . Vaping Use: Never used  Substance and Sexual Activity  . Alcohol use: Not Currently    Comment: rarely wine  . Drug use: No  . Sexual activity: Yes  Other Topics Concern  . 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   Social Determinants of Health   Financial Resource Strain: Not on file  Food Insecurity: Not on file  Transportation Needs: Not on file  Physical Activity: Not on file  Stress: Not on file  Social Connections: Not on file  Intimate Partner Violence: Not on file     No Known Allergies   Outpatient Medications Prior to Visit  Medication Sig Dispense Refill  . aspirin 81 MG EC tablet Take by mouth.    . cholecalciferol (VITAMIN D3) 25 MCG (1000 UNIT) tablet Take 1,000 Units by mouth daily.    . Cyanocobalamin (VITAMIN B-12 PO) Take 1,000 mcg by mouth daily.    Marland Kitchen gabapentin (NEURONTIN) 300 MG capsule Take 300 mg by mouth 3 (three) times daily.    . hydrocortisone (ANUSOL-HC) 2.5 % rectal cream Place 1 application rectally 2 (two) times daily. 30 g 1  . IRON PO Take 50 mg by mouth daily.    . magnesium 30 MG tablet Take  30 mg by mouth 2 (two) times daily.    Marland Kitchen MAGNESIUM CHLORIDE PO Take 1 tablet by mouth daily.    . Omega-3 Fatty Acids (FISH OIL) 1200 MG CAPS Take 1 capsule by mouth 2 (two) times daily.    Marland Kitchen omeprazole (PRILOSEC) 40 MG capsule Take by mouth.    . polyethylene glycol powder (GLYCOLAX/MIRALAX) 17 GM/SCOOP powder Take by mouth.    . pyridOXINE (VITAMIN B-6) 50 MG tablet Take 50 mg by mouth daily.    Marland Kitchen senna-docusate (SENOKOT-S) 8.6-50 MG tablet Take 2 tablets by mouth daily.    . SOOLANTRA 1 % CREA as needed.   5  . tamsulosin (FLOMAX) 0.4 MG CAPS capsule Take 1 capsule (0.4 mg total) by mouth in the morning and at bedtime. 180 capsule 3  . TURMERIC PO Take 500 mg by mouth daily.    . vitamin C (ASCORBIC ACID) 500 MG tablet Take 500 mg by mouth daily.     No facility-administered medications prior to visit.    Review of Systems  Constitutional: Negative for chills, fever, malaise/fatigue and weight loss.  HENT: Negative for congestion, sinus pain and sore throat.   Eyes: Negative.   Respiratory: Negative for cough, hemoptysis, sputum production, shortness of breath and wheezing.   Cardiovascular: Negative for chest pain, palpitations, orthopnea, claudication and leg swelling.  Gastrointestinal: Negative for abdominal pain, heartburn, nausea and vomiting.  Genitourinary: Negative.   Musculoskeletal: Positive for back pain. Negative for joint pain and myalgias.  Skin: Negative for rash.  Neurological: Negative for weakness.  Endo/Heme/Allergies: Negative.   Psychiatric/Behavioral: Negative.     Objective:   Vitals:   10/04/20 1526  BP: 120/82  Pulse: 74  Temp: 97.9 F (36.6 C)  TempSrc: Temporal  SpO2: 97%  Weight: 188 lb 3.2 oz (85.4 kg)  Height: 6\' 2"  (1.88 m)   Physical Exam Constitutional:      General: He is not in acute distress. HENT:     Head: Normocephalic and atraumatic.  Eyes:     Extraocular Movements: Extraocular movements intact.     Conjunctiva/sclera:  Conjunctivae normal.     Pupils: Pupils are equal, round, and reactive to light.  Cardiovascular:     Rate and Rhythm: Normal rate and regular rhythm.     Pulses: Normal pulses.     Heart sounds: Normal heart sounds. No murmur heard.  Pulmonary:     Effort: Pulmonary effort is normal.     Breath sounds: Rales (bibasilar) present. No wheezing or rhonchi.  Abdominal:     General: Bowel sounds are normal.     Palpations: Abdomen is soft.  Musculoskeletal:     Right lower leg: No edema.     Left lower leg: No edema.  Lymphadenopathy:     Cervical: No cervical adenopathy.  Skin:    General: Skin is warm and dry.  Neurological:     General: No focal deficit present.     Mental Status: He is alert.  Psychiatric:        Mood and Affect: Mood normal.        Behavior: Behavior normal.        Thought Content: Thought content normal.        Judgment: Judgment normal.    CBC    Component Value Date/Time   WBC 5.9 09/28/2019 1418   RBC 4.96 09/28/2019 1418   HGB 12.7 (L) 09/28/2019 1418   HCT 39.3 09/28/2019 1418   PLT 207.0 09/28/2019 1418   MCV 79.2 09/28/2019 1418   MCH 24.8 (L) 06/13/2018 1020   MCHC 32.4 09/28/2019 1418   RDW 14.4 09/28/2019 1418   LYMPHSABS 1.6 09/28/2019 1418   MONOABS 0.5 09/28/2019 1418   EOSABS 0.1 09/28/2019 1418   BASOSABS 0.0 09/28/2019 1418   Chest imaging: CT Chest wo contrast 08/30/20 Mediastinum/Nodes: No mediastinal, hilar, or axillary adenopathy. Trachea and esophagus are unremarkable. Thyroid unremarkable.  Lungs/Pleura: Biapical scarring. Peripheral interstitial prominence throughout much of the right lung and in the left base most compatible with scarring. Calcified granuloma in the left lung base. Ground-glass nodule in the left lower lobe on image 139 measures 8 mm. No effusions.  PFT: No flowsheet data found.  Echo 11/16/2015: - Left ventricle: The cavity size was normal. Wall thickness was  normal. Systolic function was  vigorous. The estimated ejection  fraction was in the range of 65% to 70%. Wall motion was normal;  there were no regional wall motion abnormalities. Doppler  parameters are consistent with abnormal left ventricular  relaxation (grade 1 diastolic dysfunction). The E/e&' ratio is <8,  suggesting normal LV filling pressure.  - Aortic valve: Trileaflet. There was mild regurgitation. Valve  area (VTI): 2.96 cm^2. Valve area (Vmax): 3.16 cm^2. Valve area  (Vmean): 3.08 cm^2.  - Mitral valve: Mildly thickened leaflets . There was trivial  regurgitation.  - Left atrium: The atrium was at the upper limits of normal in  size.  - Right atrium: The atrium was mildly dilated.  - Tricuspid valve: There was trivial regurgitation.  - Pulmonary arteries: PA peak pressure: 21 mm Hg (S).  - Inferior vena cava: The vessel was normal in size. The  respirophasic diameter changes were in the normal range (>= 50%),  consistent with normal central venous pressure.   Exercise Tolerance Test 10/23/2016  Blood pressure demonstrated a hypertensive response to exercise.  There was no ST segment deviation noted during stress.  Normal exercise treadmill stress test.  [facility] treadmill score predicts a low risk for major cardiac events.    Assessment & Plan:   Pulmonary nodule - Plan: CT CHEST NODULE FOLLOW UP LOW DOSE W/O  ILD (interstitial lung disease) ([place])  Discussion: Nael Petrosyan is a 75 year old male, former smoker who is referred to pulmonary clinic for evaluation of pulmonary nodule.   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 He now uses the proper protective equipment for further risk reduction of disease progression. He is asymptomatic at this time without cough and dyspnea.   His CT chest scan findings were reviewed in detail with the patient. We will monitor the left lower lobe pulmonary  nodule with a subsequent low dose CT chest scan in 6 months.    Labs on 09/28/19 showed serum bicarb level of 56mEq/L. We will continue to monitor his labs and respiratory symptoms. He does have history of sleep apnea which may warrant re-testing in the future should he develop symptoms of sleep apnea or progressive respiratory symptoms.   Follow up in 6 months.  Freda Jackson, MD [facility] Office: [phone]    Current Outpatient Medications:  .  aspirin 81 MG EC tablet, Take by mouth., Disp: , Rfl:  .  cholecalciferol (VITAMIN D3) 25 MCG (1000 UNIT) tablet, Take 1,000 Units by mouth daily., Disp: , Rfl:  .  Cyanocobalamin (VITAMIN B-12 PO), Take 1,000 mcg by mouth daily., Disp: , Rfl:  .  gabapentin (NEURONTIN) 300 MG capsule, Take 300 mg by mouth 3 (three) times daily., Disp: , Rfl:  .  hydrocortisone (ANUSOL-HC) 2.5 % rectal cream, Place 1 application rectally 2 (two) times daily., Disp: 30 g, Rfl: 1 .  IRON PO, Take 50 mg by mouth daily., Disp: , Rfl:  .  magnesium 30 MG tablet, Take 30 mg by mouth 2 (two) times daily., Disp: , Rfl:  .  MAGNESIUM CHLORIDE PO, Take 1 tablet by mouth daily., Disp: , Rfl:  .  Omega-3 Fatty Acids (FISH OIL) 1200 MG CAPS, Take 1 capsule by mouth 2 (two) times daily., Disp: , Rfl:  .  omeprazole (PRILOSEC) 40 MG capsule, Take by mouth., Disp: , Rfl:  .  polyethylene glycol powder (GLYCOLAX/MIRALAX) 17 GM/SCOOP powder, Take by mouth., Disp: , Rfl:  .  pyridOXINE (VITAMIN B-6) 50 MG tablet, Take 50 mg by mouth daily., Disp: , Rfl:  .  senna-docusate (SENOKOT-S) 8.6-50 MG tablet, Take 2 tablets by mouth daily., Disp: , Rfl:  .  SOOLANTRA 1 % CREA, as needed. , Disp: , Rfl: 5 .  tamsulosin (FLOMAX) 0.4 MG CAPS capsule, Take 1 capsule (0.4 mg total) by mouth in the morning and at bedtime., Disp: 180 capsule, Rfl: 3 .  TURMERIC PO, Take 500 mg by mouth daily., Disp: , Rfl:  .  vitamin C (ASCORBIC ACID) 500 MG tablet, Take 500 mg by  mouth daily., Disp: , Rfl:

## 2020-10-05 ENCOUNTER — Encounter: Payer: Self-pay | Admitting: Urology

## 2020-10-05 ENCOUNTER — Ambulatory Visit (INDEPENDENT_AMBULATORY_CARE_PROVIDER_SITE_OTHER): Payer: Medicare PPO | Admitting: Urology

## 2020-10-05 ENCOUNTER — Other Ambulatory Visit: Payer: Self-pay

## 2020-10-05 VITALS — BP 120/74 | HR 76 | Temp 99.0°F | Ht 74.0 in | Wt 188.2 lb

## 2020-10-05 DIAGNOSIS — N4889 Other specified disorders of penis: Secondary | ICD-10-CM | POA: Diagnosis not present

## 2020-10-05 DIAGNOSIS — N5201 Erectile dysfunction due to arterial insufficiency: Secondary | ICD-10-CM

## 2020-10-05 MED ORDER — CLOTRIMAZOLE-BETAMETHASONE 1-0.05 % EX CREA: 1.0000 "application " | TOPICAL_CREAM | Freq: Two times a day (BID) | CUTANEOUS | 0 refills | Status: DC

## 2020-10-05 NOTE — Progress Notes (Signed)
10/05/2020 3:01 PM   Russell [name] 07/12/1946 [id number]  Referring provider: Marrian Salvage, [place],   [id number]  Penile edema  HPI: [name] is a 75yo here with penile edema. He was using his vacuum erection device and noted worsening penile edema and mild penile pain. He is uncircumicised and cannot reduce his foreskin since using the vacuum erection device.    PMH: Past Medical History:  Diagnosis Date  . Anemia 2019  . Arthritis   . Bleeding internal hemorrhoids 09/10/2006   Qualifier: Diagnosis of  By: Jonna Munro MD, Roderic Scarce    . 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[place]) 2011  . Cerebellar infarct ([place])   . Cervical spine degeneration   . Constipation   . GERD (gastroesophageal reflux disease)   . Hemorrhoids   . Hx of colonic polyp 05/29/2018   05/2018 cecal ssp  . Kidney stone    Remote  . Maxillary sinus polyp   . Prolapsed internal hemorrhoids, grade 3 09/10/2006   Qualifier: Diagnosis of  By: Jonna Munro MD, Roderic Scarce    . Salivary gland enlargement   . Sleep apnea 10 years ago   declined CPAP  . Stroke ([place]) 15-20 years ago    Surgical History: Past Surgical History:  Procedure Laterality Date  . COLONOSCOPY    . HEMORRHOID BANDING    . LUMBAR LAMINECTOMY  03/08/2010   L2-3, cauda equina syndrome  . UPPER GI ENDOSCOPY      Home Medications:  Allergies as of 10/05/2020   No Known Allergies     Medication List       Accurate as of October 05, 2020  3:01 PM. If you have any questions, ask your nurse or doctor.        aspirin 81 MG EC tablet Take by mouth.   cholecalciferol 25 MCG (1000 UNIT) tablet Commonly known as: VITAMIN D3 Take 1,000 Units by mouth daily.   Fish Oil 1200 MG Caps Take 1 capsule by mouth 2 (two) times daily.   gabapentin 300 MG capsule Commonly known as: NEURONTIN Take 300 mg by mouth 3 (three) times daily.   HYDROcodone-acetaminophen 5-325 MG tablet Commonly known as: NORCO/VICODIN Take by  mouth.   hydrocortisone 2.5 % rectal cream Commonly known as: ANUSOL-HC Place 1 application rectally 2 (two) times daily.   IRON PO Take 50 mg by mouth daily.   magnesium 30 MG tablet Take 30 mg by mouth 2 (two) times daily.   MAGNESIUM CHLORIDE PO Take 1 tablet by mouth daily.   omeprazole 40 MG capsule Commonly known as: PRILOSEC Take by mouth.   polyethylene glycol powder 17 GM/SCOOP powder Commonly known as: GLYCOLAX/MIRALAX Take by mouth.   pyridOXINE 50 MG tablet Commonly known as: VITAMIN B-6 Take 50 mg by mouth daily.   senna-docusate 8.6-50 MG tablet Commonly known as: Senokot-S Take 2 tablets by mouth daily.   Soolantra 1 % Crea Generic drug: Ivermectin as needed.   tamsulosin 0.4 MG Caps capsule Commonly known as: FLOMAX Take 1 capsule (0.4 mg total) by mouth in the morning and at bedtime.   TURMERIC PO Take 500 mg by mouth daily.   VITAMIN B-12 PO Take 1,000 mcg by mouth daily.   vitamin C 500 MG tablet Commonly known as: ASCORBIC ACID Take 500 mg by mouth daily.       Allergies: No Known Allergies  Family History: Family History  Problem Relation Age of Onset  . Aortic aneurysm Mother   .  Heart attack Father   . Aortic aneurysm Sister   . CVA Sister   . Colon cancer Neg Hx   . Pancreatic cancer Neg Hx   . Rectal cancer Neg Hx   . Stomach cancer Neg Hx     Social History:  reports that he quit smoking about 46 years ago. His smoking use included cigarettes. He started smoking about 55 years ago. He smoked 0.25 packs per day. He has never used smokeless tobacco. He reports previous alcohol use. He reports that he does not use drugs.  ROS: All other review of systems were reviewed and are negative except what is noted above in HPI  Physical Exam: BP 120/74   Pulse 76   Temp 99 F (37.2 C)   Ht 6\' 2"  (1.88 m)   Wt 188 lb 3.2 oz (85.4 kg)   BMI 24.16 kg/m   Constitutional:  Alert and oriented, No acute distress. HEENT: [employer] AT,  moist mucus membranes.  Trachea midline, no masses. Cardiovascular: No clubbing, cyanosis, or edema. Respiratory: Normal respiratory effort, no increased work of breathing. GI: Abdomen is soft, nontender, nondistended, no abdominal masses GU: No CVA tenderness. Uncircumcised phallus with paraphimosis. Moderate foreskin edema. No masses/lesions on penis, testis, scrotum. Lymph: No cervical or inguinal lymphadenopathy. Skin: No rashes, bruises or suspicious lesions. Neurologic: Grossly intact, no focal deficits, moving all 4 extremities. Psychiatric: Normal mood and affect.  Laboratory Data: Lab Results  Component Value Date   WBC 5.9 09/28/2019   HGB 12.7 (L) 09/28/2019   HCT 39.3 09/28/2019   MCV 79.2 09/28/2019   PLT 207.0 09/28/2019    Lab Results  Component Value Date   CREATININE 0.69 09/28/2019    Lab Results  Component Value Date   PSA 1.95 09/28/2019   PSA 1.10 02/10/2009    Lab Results  Component Value Date   TESTOSTERONE 467.03 09/12/2006    Lab Results  Component Value Date   HGBA1C 5.6 09/29/2019    Urinalysis    Component Value Date/Time   COLORURINE YELLOW 03/07/2010 2315   APPEARANCEUR Clear 09/28/2020 1532   LABSPEC 1.021 03/07/2010 2315   PHURINE 6.0 03/07/2010 2315   GLUCOSEU Negative 09/28/2020 1532   HGBUR NEGATIVE 03/07/2010 2315   BILIRUBINUR Negative 09/28/2020 [name] 03/07/2010 2315   PROTEINUR Negative 09/28/2020 1532   PROTEINUR NEGATIVE 03/07/2010 2315   UROBILINOGEN 1.0 03/20/2018 1406   UROBILINOGEN 0.2 03/07/2010 2315   NITRITE Negative 09/28/2020 1532   NITRITE NEGATIVE 03/07/2010 2315   LEUKOCYTESUR Trace (A) 09/28/2020 1532    Lab Results  Component Value Date   LABMICR See below: 09/28/2020   WBCUA 0-5 09/28/2020   LABEPIT None seen 09/28/2020   BACTERIA None seen 09/28/2020    Pertinent Imaging:  No results found for this or any previous visit.  No results found for this or any previous  visit.  No results found for this or any previous visit.  No results found for this or any previous visit.  No results found for this or any previous visit.  No results found for this or any previous visit.  No results found for this or any previous visit.  No results found for this or any previous visit.   Assessment & Plan:    1. 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 Paraphimosis reduced.  2. Erectile dysfunction due to arterial insufficiency -Patient defers therapy at this time.    No follow-ups on file.  Nicolette Bang, MD  [facility]

## 2020-10-05 NOTE — Patient Instructions (Signed)
Erectile Dysfunction Erectile dysfunction (ED) is the inability to get or keep an erection in order to have sexual intercourse. ED is considered a symptom of an underlying disorder and not considered a disease. Erectile dysfunction may include:  Inability to get an erection.  Lack of enough hardness of the erection to allow penetration.  Loss of the erection before sex is finished. What are the causes? This condition may be caused by:  Certain medicines, such as: ? Pain relievers. ? Antihistamines. ? Antidepressants. ? Blood pressure medicines. ? Water pills (diuretics). ? Ulcer medicines. ? Muscle relaxants. ? Drugs.  Excessive drinking.  Psychological causes, such as: ? Anxiety. ? Depression. ? Sadness. ? Exhaustion. ? Performance fear. ? Stress.  Physical causes, such as: ? Artery problems. This may include diabetes, smoking, liver disease, or atherosclerosis. ? High blood pressure. ? Hormonal problems, such as low testosterone. ? Obesity. ? Nerve problems. This may include back or pelvic injuries, diabetes mellitus, multiple sclerosis, or Parkinson's disease. What are the signs or symptoms? Symptoms of this condition include:  Inability to get an erection.  Lack of enough hardness of the erection to allow penetration.  Loss of the erection before sex is finished.  Normal erections at some times, but with frequent unsatisfactory episodes.  Low sexual satisfaction in either partner due to erection problems.  A curved penis occurring with erection. The curve may cause pain or the penis may be too curved to allow for intercourse.  Never having nighttime erections. How is this diagnosed? This condition is often diagnosed by:  Performing a physical exam to find other diseases or specific problems with the penis.  Asking you detailed questions about the problem.  Performing blood tests to check for diabetes mellitus or to measure hormone levels.  Performing  other tests to check for underlying health conditions.  Performing an ultrasound exam to check for scarring.  Performing a test to check blood flow to the penis.  Doing a sleep study at home to measure nighttime erections. How is this treated? This condition may be treated by:  Medicine taken by mouth to help you achieve an erection (oral medicine).  Hormone replacement therapy to replace low testosterone levels.  Medicine that is injected into the penis. Your health care provider may instruct you how to give yourself these injections at home.  Vacuum pump. This is a pump with a ring on it. The pump and ring are placed on the penis and used to create pressure that helps the penis become erect.  Penile implant surgery. In this procedure, you may receive: ? An inflatable implant. This consists of cylinders, a pump, and a reservoir. The cylinders can be inflated with a fluid that helps to create an erection, and they can be deflated after intercourse. ? A semi-rigid implant. This consists of two silicone rubber rods. The rods provide some rigidity. They are also flexible, so the penis can both curve downward in its normal position and become straight for sexual intercourse.  Blood vessel surgery, to improve blood flow to the penis. During this procedure, a blood vessel from a different part of the body is placed into the penis to allow blood to flow around (bypass) damaged or blocked blood vessels.  Lifestyle changes, such as exercising more, losing weight, and quitting smoking. Follow these instructions at home: Medicines  Take over-the-counter and prescription medicines only as told by your health care provider. Do not increase the dosage without first discussing it with your health care  provider.  If you are using self-injections, perform injections as directed by your health care provider. Make sure to avoid any veins that are on the surface of the penis. After giving an injection,  apply pressure to the injection site for 5 minutes.   General instructions  Exercise regularly, as directed by your health care provider. Work with your health care provider to lose weight, if needed.  Do not use any products that contain nicotine or tobacco, such as cigarettes and e-cigarettes. If you need help quitting, ask your health care provider.  Before using a vacuum pump, read the instructions that come with the pump and discuss any questions with your health care provider.  Keep all follow-up visits as told by your health care provider. This is important. Contact a health care provider if:  You feel nauseous.  You vomit. Get help right away if:  You are taking oral or injectable medicines and you have an erection that lasts longer than 4 hours. If your health care provider is unavailable, go to the nearest emergency room for evaluation. An erection that lasts much longer than 4 hours can result in permanent damage to your penis.  You have severe pain in your groin or abdomen.  You develop redness or severe swelling of your penis.  You have redness spreading up into your groin or lower abdomen.  You are unable to urinate.  You experience chest pain or a rapid heart beat (palpitations) after taking oral medicines. Summary  Erectile dysfunction (ED) is the inability to get or keep an erection during sexual intercourse. This problem can usually be treated successfully.  This condition is diagnosed based on a physical exam, your symptoms, and tests to determine the cause. Treatment varies depending on the cause and may include medicines, hormone therapy, surgery, or a vacuum pump.  You may need follow-up visits to make sure that you are using your medicines or devices correctly.  Get help right away if you are taking or injecting medicines and you have an erection that lasts longer than 4 hours. This information is not intended to replace advice given to you by your health  care provider. Make sure you discuss any questions you have with your health care provider. Document Revised: 03/18/2020 Document Reviewed: 03/18/2020 Elsevier Patient Education  2021 [employer].

## 2020-10-05 NOTE — Progress Notes (Signed)
Urological Symptom Review  Patient is experiencing the following symptoms: Stream starts and stops Weak stream Penile pain (male only)    Review of Systems  Gastrointestinal (upper)  : Negative for upper GI symptoms  Gastrointestinal (lower) : Negative for lower GI symptoms  Constitutional : Negative for symptoms  Skin: Negative for skin symptoms  Eyes: Negative for eye symptoms  Ear/Nose/Throat : Negative for Ear/Nose/Throat symptoms  Hematologic/Lymphatic: Negative for Hematologic/Lymphatic symptoms  Cardiovascular : Leg swelling  Respiratory : Negative for respiratory symptoms  Endocrine: Negative for endocrine symptoms  Musculoskeletal: Negative for musculoskeletal symptoms  Neurological: Negative for neurological symptoms  Psychologic: Negative for psychiatric symptoms

## 2020-10-11 ENCOUNTER — Encounter: Payer: Self-pay | Admitting: Urology

## 2020-10-20 ENCOUNTER — Telehealth: Payer: Self-pay

## 2020-10-20 NOTE — Telephone Encounter (Signed)
Called pt and gave message. Pt chose not to recheck his urine.

## 2020-10-24 NOTE — Telephone Encounter (Signed)
Patient seen by doctor on 3/23

## 2020-10-31 ENCOUNTER — Telehealth: Payer: Self-pay | Admitting: Internal Medicine

## 2020-10-31 NOTE — Telephone Encounter (Signed)
Patient reports that he has a red rash around his anus.  He has been using anusol and Recticare daily for weeks for an external hemorrhoid, that continues to flare up every few weeks.  He had a lumbar laminectomy in Dec and has had complications from this that has affected the right side of his anal sphincter, penis, and scrotum.   His neurologist has told him will take 9 months to a year to completely resolve. He has been wearing depends due to some incontinence and fecal seepage with passing gas.  We discussed that this could be a yeast rash due to the creams and the depends.  He is advised to not put anything topical to the area for a few days and see if this will resolve.  He is advised to try SITZ baths BID with just water and soap.  He is advised to try loose cotton underwear and avoid the depends for a few days.  If the rash has not resolved after 48-72 hours he is asked to try mycolog cream OTC. We discussed could offer an appointment for incompetent sphincter if he desires, he states that should slowly resolve over the next 6 months.  He thanked me for the call and will call back for additional questions or concerns.

## 2020-10-31 NOTE — Telephone Encounter (Signed)
Agree with RN recommendations.

## 2020-11-09 ENCOUNTER — Ambulatory Visit: Payer: Medicare PPO | Admitting: Urology

## 2020-12-29 ENCOUNTER — Encounter: Payer: Self-pay | Admitting: Urology

## 2021-01-04 ENCOUNTER — Ambulatory Visit (INDEPENDENT_AMBULATORY_CARE_PROVIDER_SITE_OTHER): Payer: Medicare PPO | Admitting: Urology

## 2021-01-04 ENCOUNTER — Encounter: Payer: Self-pay | Admitting: Urology

## 2021-01-04 ENCOUNTER — Other Ambulatory Visit: Payer: Self-pay

## 2021-01-04 VITALS — BP 131/82 | HR 73

## 2021-01-04 DIAGNOSIS — N401 Enlarged prostate with lower urinary tract symptoms: Secondary | ICD-10-CM | POA: Diagnosis not present

## 2021-01-04 DIAGNOSIS — R339 Retention of urine, unspecified: Secondary | ICD-10-CM

## 2021-01-04 DIAGNOSIS — N138 Other obstructive and reflux uropathy: Secondary | ICD-10-CM | POA: Diagnosis not present

## 2021-01-04 LAB — URINALYSIS, ROUTINE W REFLEX MICROSCOPIC
Bilirubin, UA: NEGATIVE
Glucose, UA: NEGATIVE
Ketones, UA: NEGATIVE
Leukocytes,UA: NEGATIVE
Nitrite, UA: NEGATIVE
Protein,UA: NEGATIVE
RBC, UA: NEGATIVE
Specific Gravity, UA: 1.015 (ref 1.005–1.030)
Urobilinogen, Ur: 4 mg/dL — ABNORMAL HIGH (ref 0.2–1.0)
pH, UA: 7 (ref 5.0–7.5)

## 2021-01-04 LAB — BLADDER SCAN AMB NON-IMAGING: Scan Result: 171

## 2021-01-04 MED ORDER — SILODOSIN 8 MG PO CAPS: 8.0000 mg | ORAL_CAPSULE | Freq: Every day | ORAL | 11 refills | Status: DC

## 2021-01-04 NOTE — Progress Notes (Signed)
post void residual=171  Urological Symptom Review  Patient is experiencing the following symptoms: Frequent urination Leakage of urine Stream starts and stops Trouble starting stream Weak stream Erection problems (male only)   Review of Systems  Gastrointestinal (upper)  : Indigestion/heartburn  Gastrointestinal (lower) : Negative for lower GI symptoms  Constitutional : Weight loss  Skin: Negative for skin symptoms  Eyes: Negative for eye symptoms  Ear/Nose/Throat : Negative for Ear/Nose/Throat symptoms  Hematologic/Lymphatic: Negative for Hematologic/Lymphatic symptoms  Cardiovascular : Leg swelling  Respiratory : Negative for respiratory symptoms  Endocrine: Negative for endocrine symptoms  Musculoskeletal: Joint pain  Neurological: Negative for neurological symptoms  Psychologic: Negative for psychiatric symptoms

## 2021-01-04 NOTE — Progress Notes (Signed)
01/04/2021 11:31 AM   Russell [name] 1946/03/17 [id number]  Referring provider: Marrian Salvage, [address] [place],  [place] [address]  Followup BPH   HPI: [name] is a 75yo here for followup for BPh and urinary retention. He was doing well until 2 weeks ago when he developed a weaker stream and feeling of incomplete emptying. The symptoms improved 4 days ago but is not to his satisfaction. He continues to have a weak stream. IPSS 23 and QOL 5. He remains on flomax BID. No other complaints today. PVR 171cc.    PMH: Past Medical History:  Diagnosis Date   Anemia 2019   Arthritis    Bleeding internal hemorrhoids 09/10/2006   Qualifier: Diagnosis of  By: Jonna Munro MD, Cornelius     Cauda equina syndrome [facility]) 2011   Cerebellar infarct [facility])    Cervical spine degeneration    Constipation    GERD (gastroesophageal reflux disease)    Hemorrhoids    Hx of colonic polyp 05/29/2018   05/2018 cecal ssp   Kidney stone    Remote   Maxillary sinus polyp    Prolapsed internal hemorrhoids, grade 3 09/10/2006   Qualifier: Diagnosis of  By: Jonna Munro MD, Cornelius     Salivary gland enlargement    Sleep apnea 10 years ago   declined CPAP   Stroke ([facility]) 15-20 years ago    Surgical History: Past Surgical History:  Procedure Laterality Date   COLONOSCOPY     HEMORRHOID BANDING     LUMBAR LAMINECTOMY  03/08/2010   L2-3, cauda equina syndrome   UPPER GI ENDOSCOPY      Home Medications:  Allergies as of 01/04/2021   No Known Allergies      Medication List        Accurate as of January 04, 2021 11:31 AM. If you have any questions, ask your nurse or doctor.          aspirin 81 MG EC tablet Take by mouth.   cholecalciferol 25 MCG (1000 UNIT) tablet Commonly known as: VITAMIN D3 Take 1,000 Units by mouth daily.   clotrimazole-betamethasone cream Commonly known as: Lotrisone Apply 1 application topically 2 (two) times daily.   Fish Oil  1200 MG Caps Take 1 capsule by mouth 2 (two) times daily.   gabapentin 300 MG capsule Commonly known as: NEURONTIN Take 300 mg by mouth 3 (three) times daily.   HYDROcodone-acetaminophen 5-325 MG tablet Commonly known as: NORCO/VICODIN Take by mouth.   hydrocortisone 2.5 % rectal cream Commonly known as: ANUSOL-HC Place 1 application rectally 2 (two) times daily.   IRON PO Take 50 mg by mouth daily.   magnesium 30 MG tablet Take 30 mg by mouth 2 (two) times daily.   MAGNESIUM CHLORIDE PO Take 1 tablet by mouth daily.   omeprazole 40 MG capsule Commonly known as: PRILOSEC Take by mouth.   polyethylene glycol powder 17 GM/SCOOP powder Commonly known as: GLYCOLAX/MIRALAX Take by mouth.   pyridOXINE 50 MG tablet Commonly known as: VITAMIN B-6 Take 50 mg by mouth daily.   senna-docusate 8.6-50 MG tablet Commonly known as: Senokot-S Take 2 tablets by mouth daily.   Soolantra 1 % Crea Generic drug: Ivermectin as needed.   tamsulosin 0.4 MG Caps capsule Commonly known as: FLOMAX Take 1 capsule (0.4 mg total) by mouth in the morning and at bedtime.   TURMERIC PO Take 500 mg by mouth daily.   VITAMIN B-12 PO Take  1,000 mcg by mouth daily.   vitamin C 500 MG tablet Commonly known as: ASCORBIC ACID Take 500 mg by mouth daily.        Allergies: No Known Allergies  Family History: Family History  Problem Relation Age of Onset   Aortic aneurysm Mother    Heart attack Father    Aortic aneurysm Sister    CVA Sister    Colon cancer Neg Hx    Pancreatic cancer Neg Hx    Rectal cancer Neg Hx    Stomach cancer Neg Hx     Social History:  reports that he quit smoking about 46 years ago. His smoking use included cigarettes. He started smoking about 55 years ago. He smoked an average of 0.25 packs per day. He has never used smokeless tobacco. He reports previous alcohol use. He reports that he does not use drugs.  ROS: All other review of systems were reviewed  and are negative except what is noted above in HPI  Physical Exam: BP 131/82   Pulse 73   Constitutional:  Alert and oriented, No acute distress. HEENT: [place] AT, moist mucus membranes.  Trachea midline, no masses. Cardiovascular: No clubbing, cyanosis, or edema. Respiratory: Normal respiratory effort, no increased work of breathing. GI: Abdomen is soft, nontender, nondistended, no abdominal masses GU: No CVA tenderness.  Lymph: No cervical or inguinal lymphadenopathy. Skin: No rashes, bruises or suspicious lesions. Neurologic: Grossly intact, no focal deficits, moving all 4 extremities. Psychiatric: Normal mood and affect.  Laboratory Data: Lab Results  Component Value Date   WBC 5.9 09/28/2019   HGB 12.7 (L) 09/28/2019   HCT 39.3 09/28/2019   MCV 79.2 09/28/2019   PLT 207.0 09/28/2019    Lab Results  Component Value Date   CREATININE 0.69 09/28/2019    Lab Results  Component Value Date   PSA 1.95 09/28/2019   PSA 1.10 02/10/2009    Lab Results  Component Value Date   TESTOSTERONE 467.03 09/12/2006    Lab Results  Component Value Date   HGBA1C 5.6 09/29/2019    Urinalysis    Component Value Date/Time   COLORURINE YELLOW 03/07/2010 2315   APPEARANCEUR Clear 01/04/2021 1107   LABSPEC 1.021 03/07/2010 2315   PHURINE 6.0 03/07/2010 2315   GLUCOSEU Negative 01/04/2021 1107   HGBUR NEGATIVE 03/07/2010 2315   BILIRUBINUR Negative 01/04/2021 1107   [place] 03/07/2010 2315   PROTEINUR Negative 01/04/2021 1107   PROTEINUR NEGATIVE 03/07/2010 2315   UROBILINOGEN 1.0 03/20/2018 1406   UROBILINOGEN 0.2 03/07/2010 2315   NITRITE Negative 01/04/2021 1107   NITRITE NEGATIVE 03/07/2010 2315   LEUKOCYTESUR Negative 01/04/2021 1107    Lab Results  Component Value Date   LABMICR Comment 01/04/2021   WBCUA 0-5 09/28/2020   LABEPIT None seen 09/28/2020   BACTERIA None seen 09/28/2020    Pertinent Imaging:  No results found for this or any previous  visit.  No results found for this or any previous visit.  No results found for this or any previous visit.  No results found for this or any previous visit.  No results found for this or any previous visit.  No results found for this or any previous visit.  No results found for this or any previous visit.  No results found for this or any previous visit.   Assessment & Plan:    1. Benign prostatic hyperplasia with urinary obstruction -We will start rapaflo 8mg  daily - Urinalysis, Routine w reflex microscopic  2.  Urinary retention -Rapaflo 8mg  daily. If his LUTS are not improved with rapaflo we will proceed with cystoscopy and possible UDS - BLADDER SCAN AMB NON-IMAGING   No follow-ups on file.  Nicolette Bang, MD  [facility] [place]

## 2021-01-04 NOTE — Patient Instructions (Signed)
Benign Prostatic Hyperplasia  Benign prostatic hyperplasia (BPH) is an enlarged prostate gland that is caused by the normal aging process and not by cancer. The prostate is a walnut-sized gland that is involved in the production of semen. It is located in front of the rectum and below the bladder. The bladder stores urine and the urethra is the tube that carries the urine out of the body. The prostate may get bigger asa man gets older. An enlarged prostate can press on the urethra. This can make it harder to pass urine. The build-up of urine in the bladder can cause infection. Back pressure and infection may progress to bladder damage and kidney (renal) failure. What are the causes? This condition is part of a normal aging process. However, not all men develop problems from this condition. If the prostate enlarges away from the urethra, urine flow will not be blocked. If it enlarges toward the urethra andcompresses it, there will be problems passing urine. What increases the risk? This condition is more likely to develop in men over the age of 50 years. What are the signs or symptoms? Symptoms of this condition include: Getting up often during the night to urinate. Needing to urinate frequently during the day. Difficulty starting urine flow. Decrease in size and strength of your urine stream. Leaking (dribbling) after urinating. Inability to pass urine. This needs immediate treatment. Inability to completely empty your bladder. Pain when you pass urine. This is more common if there is also an infection. Urinary tract infection (UTI). How is this diagnosed? This condition is diagnosed based on your medical history, a physical exam, and your symptoms. Tests will also be done, such as: A post-void bladder scan. This measures any amount of urine that may remain in your bladder after you finish urinating. A digital rectal exam. In a rectal exam, your health care provider checks your prostate by  putting a lubricated, gloved finger into your rectum to feel the back of your prostate gland. This exam detects the size of your gland and any abnormal lumps or growths. An exam of your urine (urinalysis). A prostate specific antigen (PSA) screening. This is a blood test used to screen for prostate cancer. An ultrasound. This test uses sound waves to electronically produce a picture of your prostate gland. Your health care provider may refer you to a specialist in kidney and prostate diseases (urologist). How is this treated? Once symptoms begin, your health care provider will monitor your condition (active surveillance or watchful waiting). Treatment for this condition will depend on the severity of your condition. Treatment may include: Observation and yearly exams. This may be the only treatment needed if your condition and symptoms are mild. Medicines to relieve your symptoms, including: Medicines to shrink the prostate. Medicines to relax the muscle of the prostate. Surgery in severe cases. Surgery may include: Prostatectomy. In this procedure, the prostate tissue is removed completely through an open incision or with a laparoscope or robotics. Transurethral resection of the prostate (TURP). In this procedure, a tool is inserted through the opening at the tip of the penis (urethra). It is used to cut away tissue of the inner core of the prostate. The pieces are removed through the same opening of the penis. This removes the blockage. Transurethral incision (TUIP). In this procedure, small cuts are made in the prostate. This lessens the prostate's pressure on the urethra. Transurethral microwave thermotherapy (TUMT). This procedure uses microwaves to create heat. The heat destroys and removes a small   amount of prostate tissue. Transurethral needle ablation (TUNA). This procedure uses radio frequencies to destroy and remove a small amount of prostate tissue. Interstitial laser coagulation (ILC).  This procedure uses a laser to destroy and remove a small amount of prostate tissue. Transurethral electrovaporization (TUVP). This procedure uses electrodes to destroy and remove a small amount of prostate tissue. Prostatic urethral lift. This procedure inserts an implant to push the lobes of the prostate away from the urethra. Follow these instructions at home: Take over-the-counter and prescription medicines only as told by your health care provider. Monitor your symptoms for any changes. Contact your health care provider with any changes. Avoid drinking large amounts of liquid before going to bed or out in public. Avoid or reduce how much caffeine or alcohol you drink. Give yourself time when you urinate. Keep all follow-up visits as told by your health care provider. This is important. Contact a health care provider if: You have unexplained back pain. Your symptoms do not get better with treatment. You develop side effects from the medicine you are taking. Your urine becomes very dark or has a bad smell. Your lower abdomen becomes distended and you have trouble passing your urine. Get help right away if: You have a fever or chills. You suddenly cannot urinate. You feel lightheaded, or very dizzy, or you faint. There are large amounts of blood or clots in the urine. Your urinary problems become hard to manage. You develop moderate to severe low back or flank pain. The flank is the side of your body between the ribs and the hip. These symptoms may represent a serious problem that is an emergency. Do not wait to see if the symptoms will go away. Get medical help right away. Call your local emergency services (911 in the [place] Do not drive yourself to the hospital. Summary Benign prostatic hyperplasia (BPH) is an enlarged prostate that is caused by the normal aging process and not by cancer. An enlarged prostate can press on the urethra. This can make it hard to pass urine. This  condition is part of a normal aging process and is more likely to develop in men over the age of 50 years. Get help right away if you suddenly cannot urinate. This information is not intended to replace advice given to you by your health care provider. Make sure you discuss any questions you have with your healthcare provider. Document Revised: 03/10/2020 Document Reviewed: 03/10/2020 Elsevier Patient Education  2022 [employer]  

## 2021-01-04 NOTE — Addendum Note (Signed)
Addended by: Cleon Gustin on: 01/04/2021 11:42 AM   Modules accepted: Orders

## 2021-01-18 ENCOUNTER — Encounter: Payer: Self-pay | Admitting: Family

## 2021-01-18 ENCOUNTER — Encounter: Payer: Self-pay | Admitting: Urology

## 2021-01-26 ENCOUNTER — Telehealth: Payer: Self-pay

## 2021-01-26 DIAGNOSIS — R972 Elevated prostate specific antigen [PSA]: Secondary | ICD-10-CM

## 2021-01-26 MED ORDER — LEVOFLOXACIN 750 MG PO TABS: 750.0000 mg | ORAL_TABLET | Freq: Once | ORAL | 0 refills | Status: AC

## 2021-01-26 NOTE — Telephone Encounter (Signed)
Biopsy scheduled and patient made aware. Biopsy instructions went over with patient via phone and sent via my chart and mail.

## 2021-01-26 NOTE — Progress Notes (Signed)
Sent via mychart and mail. 

## 2021-01-26 NOTE — Telephone Encounter (Signed)
Patient was told he will need a biopsy due to high PSA levels. Needing info for biopsy to be done asap.  Please advise.  Call back:  [phone]  Thanks, Helene Kelp

## 2021-02-06 ENCOUNTER — Telehealth: Payer: Self-pay | Admitting: Internal Medicine

## 2021-02-06 NOTE — Telephone Encounter (Signed)
Inbound call from patient. States he has been experiencing constipation for the past week. Have tried miralax 3 days, stool softerners for 5 days. Stools small, hard, and have to put a lot of work to get anything out. Would like a call back to discuss what else can help. Best contact number [phone]

## 2021-02-06 NOTE — Telephone Encounter (Signed)
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  For the last week has has had constipation.  He is getting small amounts of stool out with a BM, but stools are very hard, small, and dry.  He is taking stool softeners and Miralax daily. He has to strain and push for extended periods of time with very little results.   He is advised to increase the Miralax to BID, continue the stool softeners, increase the amount of fluid he is drinking with the Miralax  (currently taking with 4 oz of water), and can try glycerine suppository. He will call back for continued symptoms. He is to undergo a prostate biopsy in a few weeks.  He will call back to schedule an office visit with Dr. Carlean Purl, he wants to get through biopsy before arranging any additional appts.

## 2021-02-13 DIAGNOSIS — R911 Solitary pulmonary nodule: Secondary | ICD-10-CM

## 2021-02-13 HISTORY — DX: Solitary pulmonary nodule: R91.1

## 2021-02-15 ENCOUNTER — Ambulatory Visit (INDEPENDENT_AMBULATORY_CARE_PROVIDER_SITE_OTHER): Payer: Medicare PPO | Admitting: Urology

## 2021-02-15 ENCOUNTER — Ambulatory Visit (HOSPITAL_COMMUNITY)
Admission: RE | Admit: 2021-02-15 | Discharge: 2021-02-15 | Disposition: A | Discharge status: Home / Self Care | Payer: Medicare PPO | Source: Ambulatory Visit | Attending: Urology | Admitting: Urology

## 2021-02-15 ENCOUNTER — Telehealth: Payer: Self-pay | Admitting: Urology

## 2021-02-15 ENCOUNTER — Other Ambulatory Visit: Payer: Self-pay | Admitting: Urology

## 2021-02-15 ENCOUNTER — Other Ambulatory Visit: Payer: Self-pay

## 2021-02-15 ENCOUNTER — Encounter (HOSPITAL_COMMUNITY): Payer: Self-pay

## 2021-02-15 DIAGNOSIS — R972 Elevated prostate specific antigen [PSA]: Secondary | ICD-10-CM | POA: Insufficient documentation

## 2021-02-15 DIAGNOSIS — D291 Benign neoplasm of prostate: Secondary | ICD-10-CM | POA: Insufficient documentation

## 2021-02-15 MED ORDER — LIDOCAINE HCL (PF) 2 % IJ SOLN
INTRAMUSCULAR | Status: AC
  Administered 2021-02-15: 10 mL
  Filled 2021-02-15: qty 10

## 2021-02-15 MED ORDER — GENTAMICIN SULFATE 40 MG/ML IJ SOLN: 80.0000 mg | Freq: Once | INTRAMUSCULAR | Status: AC

## 2021-02-15 MED ORDER — LIDOCAINE HCL (PF) 2 % IJ SOLN: 10.0000 mL | Freq: Once | INTRAMUSCULAR | Status: AC

## 2021-02-15 MED ORDER — GENTAMICIN SULFATE 40 MG/ML IJ SOLN
INTRAMUSCULAR | Status: AC
  Administered 2021-02-15: 80 mg via INTRAMUSCULAR
  Filled 2021-02-15: qty 2

## 2021-02-15 NOTE — Sedation Documentation (Signed)
PT tolerated prostate biopsy procedure and antibiotic injection well today. Labs obtained and sent for pathology. PT ambulatory at discharge with no acute distress noted and verbalized understanding of discharge instructions. PT to follow up with urologist as scheduled on 02/22/21 at 3:45.

## 2021-02-15 NOTE — Telephone Encounter (Signed)
Pt called advising he passed a clot in his urine about an hr ago & per the instructions given from the transrectal [place] earlier today, he was to notify the office if this was identified.  Russell [name] is not concerned this & states he is in no pain.  Thank you

## 2021-02-16 ENCOUNTER — Encounter: Payer: Self-pay | Admitting: Urology

## 2021-02-16 DIAGNOSIS — R972 Elevated prostate specific antigen [PSA]: Secondary | ICD-10-CM | POA: Insufficient documentation

## 2021-02-16 NOTE — Telephone Encounter (Signed)
See below

## 2021-02-16 NOTE — Progress Notes (Signed)
Prostate Biopsy Procedure   Informed consent was obtained after discussing risks/benefits of the procedure.  A time out was performed to ensure correct patient identity.  Pre-Procedure: - Last PSA Level:  Lab Results  Component Value Date   PSA 1.95 09/28/2019   PSA 1.10 02/10/2009   - Gentamicin given prophylactically - Levaquin 500 mg administered PO -Transrectal Ultrasound performed revealing a 120.4 gm prostate -No significant hypoechoic or median lobe noted  Procedure: - Prostate block performed using 10 cc 1% lidocaine and biopsies taken from sextant areas, a total of 12 under ultrasound guidance.  Post-Procedure: - Patient tolerated the procedure well - He was counseled to seek immediate medical attention if experiences any severe pain, significant bleeding, or fevers - Return in one week to discuss biopsy results

## 2021-02-16 NOTE — Patient Instructions (Signed)
Transrectal Ultrasound-Guided Prostate Biopsy, Care After This sheet gives you information about how to care for yourself after your procedure. Your doctor may also give you more specific instructions. If youhave problems or questions, contact your doctor. What can I expect after the procedure? After the procedure, it is common to have: Pain and discomfort in your butt, especially while sitting. Pink-colored pee (urine), due to small amounts of blood in the pee. Burning while peeing (urinating). Blood in your poop (stool). Bleeding from your butt. Blood in your semen. Follow these instructions at home: Medicines Take over-the-counter and prescription medicines only as told by your doctor. If you were prescribed antibiotic medicine, take it as told by your doctor. Do not stop taking the antibiotic even if you start to feel better. Activity  Do not drive for 24 hours if you were given a medicine to help you relax (sedative) during your procedure. Return to your normal activities as told by your doctor. Ask your doctor what activities are safe for you. Ask your doctor when it is okay for you to have sex. Do not lift anything that is heavier than 10 lb (4.5 kg), or the limit that you are told, until your doctor says that it is safe.  General instructions  Drink enough water to keep your pee pale yellow. Watch your pee, poop, and semen for new bleeding or bleeding that gets worse. Keep all follow-up visits as told by your doctor. This is important.  Contact a doctor if you: Have blood clots in your pee or poop. Notice that your pee smells bad or unusual. Have very bad belly pain. Have trouble peeing. Notice that your lower belly feels firm. Have blood in your pee for more than 2 weeks after the procedure. Have blood in your semen for more than 2 months after the procedure. Have problems getting an erection. Feel sick to your stomach (nauseous) or throw up (vomit). Have new or worse  bleeding in your pee, poop, or semen. Get help right away if you: Have a fever or chills. Have bright red pee. Have very bad pain that does not get better with medicine. Cannot pee. Summary After this procedure, it is common to have pain and discomfort around your butt, especially while sitting. You may have blood in your pee and poop. It is common to have blood in your semen for 1-2 months. If you were prescribed antibiotic medicine, take it as told by your doctor. Do not stop taking the antibiotic even if you start to feel better. Get help right away if you have a fever or chills. This information is not intended to replace advice given to you by your health care provider. Make sure you discuss any questions you have with your healthcare provider. Document Revised: 05/16/2020 Document Reviewed: 03/17/2020 Elsevier Patient Education  2022 [employer].

## 2021-02-20 ENCOUNTER — Telehealth: Payer: Self-pay

## 2021-02-20 ENCOUNTER — Encounter: Payer: Self-pay | Admitting: Urology

## 2021-02-20 NOTE — Telephone Encounter (Signed)
Patient called and notified of negative results. Appt for Wednesday cancelled.

## 2021-02-20 NOTE — Telephone Encounter (Signed)
-----   Message from Cleon Gustin, MD sent at 02/20/2021 10:15 AM EDT ----- negative ----- Message ----- From: Dorisann Frames, RN Sent: 02/16/2021  10:25 AM EDT To: Cleon Gustin, MD  Please review

## 2021-02-22 ENCOUNTER — Ambulatory Visit: Payer: Medicare PPO | Admitting: Urology

## 2021-02-22 NOTE — Telephone Encounter (Signed)
Please see patient response below concerning urobilirubin

## 2021-02-24 NOTE — Telephone Encounter (Signed)
See my chart message

## 2021-03-08 ENCOUNTER — Other Ambulatory Visit: Payer: Self-pay

## 2021-03-08 ENCOUNTER — Ambulatory Visit: Payer: Medicare PPO | Admitting: Urology

## 2021-03-08 VITALS — BP 114/71 | HR 68

## 2021-03-08 DIAGNOSIS — N401 Enlarged prostate with lower urinary tract symptoms: Secondary | ICD-10-CM

## 2021-03-08 DIAGNOSIS — N138 Other obstructive and reflux uropathy: Secondary | ICD-10-CM

## 2021-03-08 DIAGNOSIS — R972 Elevated prostate specific antigen [PSA]: Secondary | ICD-10-CM

## 2021-03-08 LAB — URINALYSIS, ROUTINE W REFLEX MICROSCOPIC
Bilirubin, UA: NEGATIVE
Glucose, UA: NEGATIVE
Ketones, UA: NEGATIVE
Nitrite, UA: NEGATIVE
Protein,UA: NEGATIVE
Specific Gravity, UA: 1.015 (ref 1.005–1.030)
Urobilinogen, Ur: 0.2 mg/dL (ref 0.2–1.0)
pH, UA: 7 (ref 5.0–7.5)

## 2021-03-08 LAB — MICROSCOPIC EXAMINATION
Bacteria, UA: NONE SEEN
Renal Epithel, UA: NONE SEEN /hpf
WBC, UA: NONE SEEN /hpf (ref 0–5)

## 2021-03-08 MED ORDER — CIPROFLOXACIN HCL 500 MG PO TABS
500.0000 mg | ORAL_TABLET | Freq: Once | ORAL | Status: AC
  Administered 2021-03-08: 500 mg via ORAL

## 2021-03-08 MED ORDER — FINASTERIDE 5 MG PO TABS: 5.0000 mg | ORAL_TABLET | Freq: Every day | ORAL | 3 refills | Status: DC

## 2021-03-08 NOTE — Progress Notes (Signed)
post void residual= 28  Urological Symptom Review  Patient is experiencing the following symptoms: Frequent urination Hard to postpone urination Stream starts and stops Trouble starting stream Blood in urine Weak stream   Review of Systems  Gastrointestinal (upper)  : Negative for upper GI symptoms  Gastrointestinal (lower) : Negative for lower GI symptoms  Constitutional : Negative for symptoms  Skin: Negative for skin symptoms  Eyes: Negative for eye symptoms  Ear/Nose/Throat : Negative for Ear/Nose/Throat symptoms  Hematologic/Lymphatic: Negative for Hematologic/Lymphatic symptoms  Cardiovascular : Leg swelling  Respiratory : Negative for respiratory symptoms  Endocrine: Negative for endocrine symptoms  Musculoskeletal: Back pain Joint pain  Neurological: Negative for neurological symptoms  Psychologic: Negative for psychiatric symptoms

## 2021-03-08 NOTE — Progress Notes (Signed)
   03/08/21  CC: followup BPH   HPI: Russell [name] is a 75yo here for followup for BPH  Blood pressure 114/71, pulse 68. NED. A&Ox3.   No respiratory distress   Abd soft, NT, ND Normal phallus with bilateral descended testicles  Cystoscopy Procedure Note  Patient identification was confirmed, informed consent was obtained, and patient was prepped using Betadine solution.  Lidocaine jelly was administered per urethral meatus.     Pre-Procedure: - Inspection reveals a normal caliber ureteral meatus.  Procedure: The flexible cystoscope was introduced without difficulty - No urethral strictures/lesions are present. - Enlarged prostate no median lobe - Normal bladder neck - Bilateral ureteral orifices identified - Bladder mucosa  reveals no ulcers, tumors, or lesions - No bladder stones - No trabeculation  Retroflexion shows 2cm intravesical prostatic protrusion   Post-Procedure: - Patient tolerated the procedure well  Assessment/ Plan: We will start finasteride 5mg  daily  No follow-ups on file.  Nicolette Bang, MD

## 2021-03-10 ENCOUNTER — Other Ambulatory Visit: Payer: Self-pay

## 2021-03-10 DIAGNOSIS — R911 Solitary pulmonary nodule: Secondary | ICD-10-CM

## 2021-03-14 ENCOUNTER — Encounter: Payer: Self-pay | Admitting: Urology

## 2021-03-14 NOTE — Patient Instructions (Signed)
Benign Prostatic Hyperplasia  Benign prostatic hyperplasia (BPH) is an enlarged prostate gland that is caused by the normal aging process and not by cancer. The prostate is a walnut-sized gland that is involved in the production of semen. It is located in front of the rectum and below the bladder. The bladder stores urine and the urethra is the tube that carries the urine out of the body. The prostate may get bigger asa man gets older. An enlarged prostate can press on the urethra. This can make it harder to pass urine. The build-up of urine in the bladder can cause infection. Back pressure and infection may progress to bladder damage and kidney (renal) failure. What are the causes? This condition is part of a normal aging process. However, not all men develop problems from this condition. If the prostate enlarges away from the urethra, urine flow will not be blocked. If it enlarges toward the urethra andcompresses it, there will be problems passing urine. What increases the risk? This condition is more likely to develop in men over the age of 50 years. What are the signs or symptoms? Symptoms of this condition include: Getting up often during the night to urinate. Needing to urinate frequently during the day. Difficulty starting urine flow. Decrease in size and strength of your urine stream. Leaking (dribbling) after urinating. Inability to pass urine. This needs immediate treatment. Inability to completely empty your bladder. Pain when you pass urine. This is more common if there is also an infection. Urinary tract infection (UTI). How is this diagnosed? This condition is diagnosed based on your medical history, a physical exam, and your symptoms. Tests will also be done, such as: A post-void bladder scan. This measures any amount of urine that may remain in your bladder after you finish urinating. A digital rectal exam. In a rectal exam, your health care provider checks your prostate by  putting a lubricated, gloved finger into your rectum to feel the back of your prostate gland. This exam detects the size of your gland and any abnormal lumps or growths. An exam of your urine (urinalysis). A prostate specific antigen (PSA) screening. This is a blood test used to screen for prostate cancer. An ultrasound. This test uses sound waves to electronically produce a picture of your prostate gland. Your health care provider may refer you to a specialist in kidney and prostate diseases (urologist). How is this treated? Once symptoms begin, your health care provider will monitor your condition (active surveillance or watchful waiting). Treatment for this condition will depend on the severity of your condition. Treatment may include: Observation and yearly exams. This may be the only treatment needed if your condition and symptoms are mild. Medicines to relieve your symptoms, including: Medicines to shrink the prostate. Medicines to relax the muscle of the prostate. Surgery in severe cases. Surgery may include: Prostatectomy. In this procedure, the prostate tissue is removed completely through an open incision or with a laparoscope or robotics. Transurethral resection of the prostate (TURP). In this procedure, a tool is inserted through the opening at the tip of the penis (urethra). It is used to cut away tissue of the inner core of the prostate. The pieces are removed through the same opening of the penis. This removes the blockage. Transurethral incision (TUIP). In this procedure, small cuts are made in the prostate. This lessens the prostate's pressure on the urethra. Transurethral microwave thermotherapy (TUMT). This procedure uses microwaves to create heat. The heat destroys and removes a small   amount of prostate tissue. Transurethral needle ablation (TUNA). This procedure uses radio frequencies to destroy and remove a small amount of prostate tissue. Interstitial laser coagulation (ILC).  This procedure uses a laser to destroy and remove a small amount of prostate tissue. Transurethral electrovaporization (TUVP). This procedure uses electrodes to destroy and remove a small amount of prostate tissue. Prostatic urethral lift. This procedure inserts an implant to push the lobes of the prostate away from the urethra. Follow these instructions at home: Take over-the-counter and prescription medicines only as told by your health care provider. Monitor your symptoms for any changes. Contact your health care provider with any changes. Avoid drinking large amounts of liquid before going to bed or out in public. Avoid or reduce how much caffeine or alcohol you drink. Give yourself time when you urinate. Keep all follow-up visits as told by your health care provider. This is important. Contact a health care provider if: You have unexplained back pain. Your symptoms do not get better with treatment. You develop side effects from the medicine you are taking. Your urine becomes very dark or has a bad smell. Your lower abdomen becomes distended and you have trouble passing your urine. Get help right away if: You have a fever or chills. You suddenly cannot urinate. You feel lightheaded, or very dizzy, or you faint. There are large amounts of blood or clots in the urine. Your urinary problems become hard to manage. You develop moderate to severe low back or flank pain. The flank is the side of your body between the ribs and the hip. These symptoms may represent a serious problem that is an emergency. Do not wait to see if the symptoms will go away. Get medical help right away. Call your local emergency services (911 in the [place] Do not drive yourself to the hospital. Summary Benign prostatic hyperplasia (BPH) is an enlarged prostate that is caused by the normal aging process and not by cancer. An enlarged prostate can press on the urethra. This can make it hard to pass urine. This  condition is part of a normal aging process and is more likely to develop in men over the age of 50 years. Get help right away if you suddenly cannot urinate. This information is not intended to replace advice given to you by your health care provider. Make sure you discuss any questions you have with your healthcare provider. Document Revised: 03/10/2020 Document Reviewed: 03/10/2020 Elsevier Patient Education  2022 [employer]  

## 2021-03-21 ENCOUNTER — Other Ambulatory Visit: Payer: Self-pay

## 2021-03-21 ENCOUNTER — Ambulatory Visit (HOSPITAL_COMMUNITY)
Admission: RE | Admit: 2021-03-21 | Discharge: 2021-03-21 | Disposition: A | Discharge status: Home / Self Care | Payer: Medicare PPO | Source: Ambulatory Visit | Attending: Pulmonary Disease | Admitting: Pulmonary Disease

## 2021-03-21 DIAGNOSIS — R911 Solitary pulmonary nodule: Secondary | ICD-10-CM | POA: Insufficient documentation

## 2021-04-03 ENCOUNTER — Telehealth: Payer: Self-pay | Admitting: Pulmonary Disease

## 2021-04-03 NOTE — Telephone Encounter (Signed)
Called and spoke with pt letting him know that Dr. Erin Fulling would rather the appt be in person to discuss the results of the recent CT with him. Pt verbalized understanding and said that we would keep appt as is. Nothing further needed.

## 2021-04-03 NOTE — Telephone Encounter (Signed)
I was hoping the patient could come in person so I can discuss his recent CT images with him. I reviewed his imaging with Dr. Valeta Harms, our advanced bronchoscopist and we both recommend he should have a bronchoscopy with biopsy which I wanted to discuss further with him.   Thanks, Wille Glaser

## 2021-04-03 NOTE — Telephone Encounter (Signed)
Dr. Erin Fulling, please advise if you are okay with pt's visit with you being changed to a virtual visit and if you are okay with it, please let us know if you would rather it be a televisit or a video visit.

## 2021-04-04 ENCOUNTER — Telehealth: Payer: Self-pay | Admitting: Pulmonary Disease

## 2021-04-04 NOTE — Telephone Encounter (Signed)
I have changed the appt to a mychart visit as his wife has signs/symptoms of covid and did go and get seen today and they are waiting on the results.  He stated that they may not have them until Wednesday.  He is not showing any signs of symptoms, but to be safe he wanted to do the mychart visit. Will send this to [place] as an [place].

## 2021-04-06 ENCOUNTER — Encounter: Payer: Self-pay | Admitting: Pulmonary Disease

## 2021-04-06 ENCOUNTER — Telehealth (INDEPENDENT_AMBULATORY_CARE_PROVIDER_SITE_OTHER): Payer: Medicare PPO | Admitting: Pulmonary Disease

## 2021-04-06 DIAGNOSIS — R911 Solitary pulmonary nodule: Secondary | ICD-10-CM | POA: Diagnosis not present

## 2021-04-06 DIAGNOSIS — J849 Interstitial pulmonary disease, unspecified: Secondary | ICD-10-CM

## 2021-04-06 NOTE — Progress Notes (Signed)
Virtual Visit via Video Note  I connected with Russell [name] on 04/06/21 at 11:15 AM EDT by a video enabled telemedicine application and verified that I am speaking with the correct person using two identifiers.  Location: Patient: Home Provider: clinic   I discussed the limitations of evaluation and management by telemedicine and the availability of in person appointments. The patient expressed understanding and agreed to proceed.  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  He had CT chest scan on 9/6 for nodule follow up which displayed an increase in size and density of a subpleural nodule of the left lower lobe, measuring 2.7 x 2.4cm previously measuring 2.4 x 1.8cm. There may be slight progression in the subpleural interstitial changes, greatest in the lower lobes.  His wife is currently ill with mild covid and he is starting to have sore throat and headache symptoms. Otherwise he has not noticed any changes in his respiratory status since last visit. No issues with dyspnea or cough.   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  He has progressive left lower lobe lung nodule which we discussed the concern for malignancy.  I reviewed the process of obtaining a biopsy via navigational bronchoscopy which he is amenable to proceeding with after his visit with the pain specialist at [facility].  We will likely plan for biopsy procedure in middle or late October.  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 We will also place  orders for serologic testing for ILD at this time and he can come to the office at his convenience to have the labs drawn. We will also check a high resolution CT Chest scan in February 2023 for 1 year surveillance since his initial chest ct.  Follow Up Instructions: -Follow-up in February 2023 with pulmonary function tests and high-resolution CT chest scan -Please message our office after your visit with the specialist from [place] in regards to proceeding with bronchoscopy and lung biopsy of the left lower lobe nodule   I discussed the assessment and treatment plan with the patient. The patient was provided an opportunity to ask questions and all were answered. The patient agreed with the plan and demonstrated an understanding of the instructions.   The patient was advised to call back or seek an in-person evaluation if the symptoms worsen or if the condition fails to improve as anticipated.  I provided 45 minutes of non-face-to-face time during this encounter.   Freddi Starr, MD

## 2021-04-06 NOTE — Patient Instructions (Addendum)
Orders have been placed to check lab tests for autoimmune or inflammatory conditions.  Please come to our office at your convenience to have these labs drawn.  We will check a high-resolution CT chest scan in February 2023 along with pulmonary function tests at follow-up visit in February 2023.  I will wait to hear from you in regards to your visit with the pain specialist at [facility] in early October and moving forward with your lung biopsy in mid to late October.

## 2021-04-06 NOTE — Telephone Encounter (Signed)
Patient sent you a message this afternoon.   To  cut down on the wait for a appointment  with a doctor at [facility] requesting you send a referral to [facility], [address], [place], [place] [address], phone: [phone]. They'll told me this is what I had to do first.   Thank you,   Russell [name]

## 2021-04-10 ENCOUNTER — Ambulatory Visit: Payer: Medicare PPO | Admitting: Dermatology

## 2021-04-11 NOTE — Telephone Encounter (Signed)
Received the following response from patient:   "I want to move any operation needed forward expeditiously and will be getting another opinion from a [facility] doctor before we proceed. I thought this would be the best way to do it, given past experience with that organization."

## 2021-04-13 NOTE — Telephone Encounter (Signed)
Hello, Dr. Erin Fulling, please see mychart message below: thanks!  I will have the biopsy with you. I just feel it is important to get a second opinion if surgery is needed. Please schedule the biopsy.

## 2021-04-18 ENCOUNTER — Other Ambulatory Visit: Payer: Medicare PPO

## 2021-04-18 DIAGNOSIS — J849 Interstitial pulmonary disease, unspecified: Secondary | ICD-10-CM

## 2021-04-20 ENCOUNTER — Telehealth: Payer: Self-pay | Admitting: Pulmonary Disease

## 2021-04-20 DIAGNOSIS — R911 Solitary pulmonary nodule: Secondary | ICD-10-CM

## 2021-04-20 NOTE — Telephone Encounter (Signed)
PCCM:  Case discussed with Dr. Erin Fulling.   Please schedule for [facility] bronchoscopy   Orders placed for 05/16/2021  Garner Nash, DO [facility] 04/20/2021 4:54 PM

## 2021-04-21 DIAGNOSIS — C3432 Malignant neoplasm of lower lobe, left bronchus or lung: Secondary | ICD-10-CM | POA: Insufficient documentation

## 2021-04-21 DIAGNOSIS — R911 Solitary pulmonary nodule: Secondary | ICD-10-CM | POA: Diagnosis present

## 2021-04-21 LAB — HYPERSENSITIVITY PNEUMONITIS
A. Pullulans Abs: NEGATIVE
A.Fumigatus #1 Abs: NEGATIVE
Micropolyspora faeni, IgG: NEGATIVE
Pigeon Serum Abs: NEGATIVE
Thermoact. Saccharii: NEGATIVE
Thermoactinomyces vulgaris, IgG: NEGATIVE

## 2021-04-21 LAB — ANTI-JO 1 ANTIBODY, IGG: Anti JO-1: <0.2 AI (ref 0.0–0.9)

## 2021-04-21 LAB — CYCLIC CITRUL PEPTIDE ANTIBODY, IGG: Cyclic Citrullin Peptide Ab: <16 UNITS

## 2021-04-21 LAB — CENTROMERE ANTIBODIES: Centromere Ab Screen: <1 AI

## 2021-04-21 LAB — SJOGREN'S SYNDROME ANTIBODS(SSA + SSB)
SSA (Ro) (ENA) Antibody, IgG: <1 AI
SSB (La) (ENA) Antibody, IgG: <1 AI

## 2021-04-21 LAB — ANTI-SCLERODERMA ANTIBODY: Scleroderma (Scl-70) (ENA) Antibody, IgG: <1 AI

## 2021-04-21 LAB — ANTI-NUCLEAR AB-TITER (ANA TITER)
ANA TITER: 1:1280 {titer} — ABNORMAL HIGH
ANA Titer 1: 1:320 {titer} — ABNORMAL HIGH

## 2021-04-21 LAB — ANTI-SMITH ANTIBODY: ENA SM Ab Ser-aCnc: <1 AI

## 2021-04-21 LAB — RHEUMATOID FACTOR: Rheumatoid fact SerPl-aCnc: <14 IU/mL (ref <14)

## 2021-04-21 LAB — ANTI-DNA ANTIBODY, DOUBLE-STRANDED: ds DNA Ab: <1 IU/mL

## 2021-04-21 LAB — ANA: Anti Nuclear Antibody (ANA): POSITIVE — AB

## 2021-04-21 LAB — ANCA SCREEN W REFLEX TITER: ANCA Screen: NEGATIVE

## 2021-04-21 LAB — RNP ANTIBODIES: ENA RNP Ab: 0.5 AI (ref 0.0–0.9)

## 2021-04-25 ENCOUNTER — Other Ambulatory Visit: Payer: Self-pay | Admitting: Pain Medicine

## 2021-04-25 ENCOUNTER — Other Ambulatory Visit (HOSPITAL_COMMUNITY): Payer: Self-pay | Admitting: Pain Medicine

## 2021-04-25 ENCOUNTER — Other Ambulatory Visit (HOSPITAL_BASED_OUTPATIENT_CLINIC_OR_DEPARTMENT_OTHER): Payer: Self-pay | Admitting: Pain Medicine

## 2021-04-25 DIAGNOSIS — M549 Dorsalgia, unspecified: Secondary | ICD-10-CM

## 2021-04-26 NOTE — Telephone Encounter (Signed)
Called and spoke with patient to get him scheduled for follow up appt after bronch. Provided patient with information on bronch procedure as well as follow up appt with Judson Roch. Patient expressed understanding. Nothing further needed at this time.

## 2021-04-26 NOTE — Telephone Encounter (Signed)
PCC's- it looks like this has been scheduled but I am not able to tell if the pt was informed yet. Please advise, thanks.

## 2021-04-26 NOTE — Telephone Encounter (Signed)
This is scheduled just have not had the chance to contact the patient due to Staffing as of yet

## 2021-04-27 NOTE — Telephone Encounter (Signed)
Per [facility]- pt neds covid test prior to procedure 11/1  I called him to give info on testing and he refused to come to the [place] location  States that he wants to get tested at the [facility] in [place]  He says he will call and set this up  [place] for [facility]

## 2021-05-01 NOTE — Progress Notes (Signed)
Office Visit Note  Patient: Russell [name]             Date of Birth: 09-09-1945           MRN: [id number]             PCP: Marrian Salvage, FNP Referring: Marrian Salvage,* Visit Date: 05/05/2021 Occupation: @GUAROCC @  Subjective:  Positive ANA and lung nodule.   History of Present Illness: Russell [name] is a 75 y.o. male with a history of osteoarthritis.  He returns today after his last visit in December 2019.  He states that after his last visit she did fairly well started taking some supplements which helped him with the osteoarthritis.  He is always been very active all his life.  He is still farming, Teacher, adult education.  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  He had lumbar spine surgery in 2011 by Dr. Christella Noa.  He states after that he developed right ankle joint arthritis due to decreased sensation.  In September 2021 he started experiencing increased weakness in upper and lower extremities.  He went to Dr. Redmond Pulling at [facility] for second opinion and underwent C-spine fusion in September 2021.  He also had lumbar laminectomy in October 2021.  He states after the laminectomy about 6 weeks later he started having severe sciatica and left thigh numbness and pain.  He was hospitalized and treated with steroids, gabapentin and muscle relaxers.  A week later he was again hospitalized with urinary retention.  He was diagnosed with reactive arthritis.  He was given IV steroids and was discharged on IV steroids home.  He states MRI of his lumbar spine at that time showed some changes in his lungs.  He continues to have left lower extremity weakness and neuropathy.  He has difficulty with mobility and balance.  He has been using a cane.  Recently has been having testicular pain as well which he believes is related to his lumbar spine.  He is has an appointment coming up with a urologist.  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  He states increased activity causes increased discomfort.  He has been using CBD oil which is helpful with the sciatica symptoms and piriformis syndrome.  He states for the abnormal lung findings he was referred to Dr. Erin Fulling who did CT scan of his chest.  The CT of the chest showed a pulmonary nodule.  Dr. Blair Dolphin obtain some autoimmune labs and his ANA was positive.  For that reason he was referred to me for further evaluation.  He denies any history of oral ulcers, nasal ulcers, malar rash, photosensitivity, lymphadenopathy.  He gives history of Raynauds for many years.  He has history of rosacea which responds to treatment was given by his dermatologist.  Activities of Daily Living:  Patient reports morning stiffness for 0 minute.   Patient Denies nocturnal pain.  Difficulty dressing/grooming: Reports Difficulty climbing stairs: Reports Difficulty getting out of chair: Denies Difficulty using hands for taps, buttons, cutlery, and/or writing: Reports  Review of Systems  Constitutional:  Positive for fatigue and weight loss. Negative for night sweats.  HENT:  Negative for mouth sores, mouth dryness and nose dryness.   Eyes:  Negative for redness and dryness.  Respiratory:  Negative for shortness of breath and difficulty breathing.   Cardiovascular:  Negative for chest pain, palpitations, hypertension, irregular heartbeat  and swelling in legs/feet.  Gastrointestinal:  Positive for constipation and diarrhea.  Endocrine: Negative for increased urination.  Genitourinary:  Positive for difficulty urinating and pelvic pain.  Musculoskeletal:  Positive for joint pain, joint pain and morning stiffness. Negative for joint swelling, myalgias, muscle weakness, muscle tenderness and myalgias.  Skin:  Positive for color change and rash. Negative for hair loss, nodules/bumps, skin tightness, ulcers and sensitivity to sunlight.       rosacea  Allergic/Immunologic:  Negative for susceptible to infections.  Neurological:  Negative for dizziness, fainting, memory loss, night sweats and weakness ( ).  Hematological:  Negative for swollen glands.  Psychiatric/Behavioral:  Negative for depressed mood and sleep disturbance. The patient is not nervous/anxious.    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[place]) 06/11/2018   Primary osteoarthritis of left knee 06/11/2018   Primary osteoarthritis of both feet 06/11/2018   Plantar fasciitis 06/11/2018   Hx of colonic polyp 05/29/2018   Left lumbar radiculopathy 81/27/5170   Diastolic dysfunction 01/74/9449   Psoriasis 10/15/2016   DEGENERATIVE JOINT DISEASE 02/07/2009   HEADACHE 02/07/2009   GERD 10/02/2007   HYPERTENSION, BENIGN ESSENTIAL 10/08/2006   TESTOSTERONE DEFICIENCY 09/10/2006   ERECTILE DYSFUNCTION 09/10/2006   Prolapsed internal hemorrhoids, grade 2 and 3 09/10/2006   PLANTAR FASCIITIS, BILATERAL 09/10/2006   SLEEP APNEA 09/10/2006   MALAISE AND FATIGUE 09/10/2006    Past Medical History:  Diagnosis Date   Anemia 2019   Arachnoiditis    Arthritis    Bleeding internal hemorrhoids 09/10/2006   Qualifier: Diagnosis of  By: Jonna Munro MD, Cornelius     Cauda equina syndrome [facility]) 2011   Cerebellar infarct [facility])    Cervical spine degeneration    Constipation    Enlarged prostate    GERD (gastroesophageal reflux disease)    Hemorrhoids    Hx of colonic polyp  05/29/2018   05/2018 cecal ssp   Kidney stone    Remote   Maxillary sinus polyp    Prolapsed internal hemorrhoids, grade 3 09/10/2006   Qualifier: Diagnosis of  By: Jonna Munro MD, Cornelius     Salivary gland enlargement    Sleep apnea 10 years ago   declined CPAP   Stroke ([place]) 15-20 years ago    Family History  Problem Relation Age of Onset   Aortic aneurysm Mother    Heart attack Father    Aortic aneurysm Sister    CVA Sister    Aortic aneurysm Sister    Aortic aneurysm Sister    Colon cancer Neg Hx    Pancreatic cancer Neg Hx    Rectal cancer Neg Hx    Stomach cancer Neg Hx    Past Surgical History:  Procedure Laterality Date   COLONOSCOPY     HEMORRHOID BANDING     LUMBAR LAMINECTOMY  03/08/2010   L2-3, cauda equina syndrome   UPPER GI ENDOSCOPY     Social History   Social History Narrative   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   Immunization History  Administered Date(s) Administered   Influenza Split 05/18/2014   Influenza Whole 04/28/2008   Influenza, High Dose Seasonal PF 03/31/2019, 04/08/2020   Influenza,inj,Quad PF,6+ Mos 05/01/2019   Influenza,inj,quad, With Preservative 05/13/2018   Influenza-Unspecified 05/18/2014, 05/13/2018   Moderna SARS-COV2 Booster Vaccination 06/01/2020   Moderna Sars-Covid-2 Vaccination 08/21/2019, 09/19/2019   Pneumococcal Conjugate-13 10/09/2013   Pneumococcal-Unspecified 10/09/2013   Td 04/28/2008   Tdap 04/28/2008   Zoster Recombinat (Shingrix) 12/21/2014   Zoster, Live 12/21/2014     Objective: Vital Signs: BP 134/79 (BP Location: Left Arm, Patient Position: Sitting, Cuff Size: Small)   Pulse (!) 55   Resp 12   Ht 6' 2"  (1.88 m)   Wt 183 lb 4.8 oz (83.1 kg)   BMI 23.53 kg/m    Physical Exam Vitals and nursing note reviewed.   Constitutional:      Appearance: He is well-developed.  HENT:     Head: Normocephalic and atraumatic.  Eyes:     Conjunctiva/sclera: Conjunctivae normal.     Pupils: Pupils are equal, round, and reactive to light.  Cardiovascular:     Rate and Rhythm: Normal rate and regular rhythm.     Heart sounds: Normal heart sounds.  Pulmonary:     Effort: Pulmonary effort is normal.     Breath sounds: Normal breath sounds.  Abdominal:     General: Bowel sounds are normal.     Palpations: Abdomen is soft.  Musculoskeletal:     Cervical back: Normal range of motion and neck supple.  Skin:    General: Skin is warm and dry.     Capillary Refill: Capillary refill takes less than 2 seconds.     Comments: Erythema on the forehead and malar region was noted consistent with rosacea.  Neurological:     Mental Status: He is alert and oriented to person, place, and time.  Psychiatric:        Behavior: Behavior normal.     Musculoskeletal Exam: He had limited range of motion of his cervical spine due to fusion.  He had limited range of motion of his lumbar spine with discomfort.  There was no SI joint tenderness.  Shoulder joints and elbow joints with good range of motion.  Bilateral wrist joints with good range of motion with no synovitis.  He had severe PIP and DIP thickening and limited range of motion of PIP and DIP joints.  No synovitis was noted.  No nail pitting or nail dystrophy was noted.  Hip joints and knee joints in good range of motion.  He had no tenderness over ankles or MTPs.  CDAI Exam: CDAI Score: -- Patient Global: --; Provider Global: -- Swollen: --; Tender: -- Joint Exam 05/05/2021   No joint exam has been documented for this visit   There is currently no information documented on the homunculus. Go to the Rheumatology activity and complete the homunculus joint exam.  Investigation: No additional findings.  Imaging: XR Hand 2 View Left  Result Date: 05/05/2021 Severe CMC  narrowing and subluxation was noted.  Narrowing of first, second and fourth MCP joints was noted.  Severe narrowing of all PIP and DIP joints was noted.  Ankylosis of fourth and fifth PIP  joints was noted.  Increased narrowing of PIP and DIP joints was noted when compared to the x-rays of 2019.  No intercarpal or radiocarpal joint space narrowing was noted.  No erosive changes were noted. 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  XR Hand 2 View Right  Result Date: 05/05/2021 Severe CMC narrowing and subluxation with spurring was noted.  Second MCP narrowing was noted.  Severe narrowing of all PIP and DIP joints with subluxation of some of these joints was noted.  No erosive changes were noted.  No intercarpal or radiocarpal joint space narrowing was noted.  Increased narrowing of PIP and DIP joints was noted when compared to the x-rays of 2019. 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   Recent Labs: Lab Results  Component Value Date   WBC 5.9 09/28/2019   HGB 12.7 (L) 09/28/2019   PLT 207.0 09/28/2019   NA 139 09/28/2019   K 3.9 09/28/2019   CL 102 09/28/2019   CO2 30 09/28/2019   GLUCOSE 78 09/28/2019   BUN 16 09/28/2019   CREATININE 0.69 09/28/2019   BILITOT 0.6 09/28/2019   ALKPHOS 44 09/28/2019   AST 21 09/28/2019   ALT 16 09/28/2019   PROT 6.9 09/28/2019   ALBUMIN 4.2 09/28/2019   CALCIUM 9.2 09/28/2019   GFRAA >60 06/13/2018   April 18, 2021 ANA 1: 320NS, 1: 1280NO, SSA negative, SSB negative, RNP negative, dsDNA negative, centromere negative, SCL 70 negative, RF negative, anti-CCP negative, Jo 1 negative  May 21, 2018 RF negative, 14 3 3  eta negative, CCP negative, ESR 11, uric acid 5.5, HLA-B27 negative   Speciality Comments: No specialty comments available.  Procedures:  No procedures performed Allergies: Patient has no known allergies.   Assessment / Plan:     Visit Diagnoses:  Positive ANA (antinuclear antibody)-he has positive ANA.  ENA is completely negative.  He also gives history of Raynauds which is most likely related to history of smoking.  There is no history of oral ulcers, nasal ulcers, malar rash, photosensitivity, lymphadenopathy or inflammatory arthritis.  I had detailed discussion with the patient regarding ANA.  I believe that the ANA finding is nonspecific.  Pulmonary nodule-the CT scan of the chest from March 21, 2021 showed a 2.7 cm solid pulmonary nodule in the posterior left lower lobe with mild increase in the size.  Subpleural interstitial prominence was also noted in the lung bases with mild bronchiectasis.There was question about chronic interstitial lung disease.  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  He is followed by Dr. Erin Fulling.  He is a scheduled to have bronchoscopy and lung biopsy.  Primary osteoarthritis of both hands - X-ray revealed second and third MCP joint narrowing and PIP and DIP severe narrowing. -He continues to have pain and stiffness in his hands.  Which is quite well manageable with partial anti-inflammatories and topical agents.  He had no synovitis on my examination.  No nail dystrophy or nail pitting was noted.  Plan: XR Hand 2 View Right, XR Hand 2 View Left.  X-rays obtained today showed severe osteoarthritis of bilateral hands.  On comparison with previous radiographs from 2019 progression of the osteoarthritis was noted.  T  Primary osteoarthritis of left knee-he denies discomfort today.  Primary osteoarthritis of both feet-he continues to have off-and-on pain in his feet due to osteoarthritis.  No synovitis or tenderness was noted.  DDD (degenerative disc disease), cervical - he had seen Dr. Reuel Boom in the past.  He had C-spine fusion in September 2021 by Dr. Redmond Pulling at [facility] for [place].  DDD (degenerative disc disease), lumbar-he had lumbar laminectomy by Dr. Redmond Pulling at  [facility] in October 2021.  He has residual left thigh numbness and left lower extremity weakness.  He has constant discomfort and sciatica symptoms in his left lower extremity.  He also complains of urinary retention and recent testicular pain.  He has an appointment coming up with a urologist.  He states he will also get e-stim unit for pain relief.  Rosacea-he is followed by dermatology.  He states he has been treated for rosacea on his face response to the treatment.  Although he does not like to use it very often due to the cost of the medication.  Other medical problems are listed as follows:  History of kidney stones  Hx of colonic polyp  HYPERTENSION, BENIGN ESSENTIAL  Diastolic dysfunction  Cerebellar infarct [facility])  Former smoker - He smoked  1 pack/day for 9 years.  He quit smoking in 1976.  Orders: Orders Placed This Encounter  Procedures   XR Hand 2 View Right   XR Hand 2 View Left    No orders of the defined types were placed in this encounter.    Follow-Up Instructions: Return in about 3 months (around 08/05/2021) for Positive ANA, Osteoarthritis.   Bo Merino, MD  Note - This record has been created using Editor, commissioning.  Chart creation errors have been sought, but may not always  have been located. Such creation errors do not reflect on  the standard of medical care.

## 2021-05-01 NOTE — Telephone Encounter (Signed)
JD please advise. Thanks  

## 2021-05-02 ENCOUNTER — Encounter: Payer: Self-pay | Admitting: Urology

## 2021-05-02 NOTE — Telephone Encounter (Signed)
Please advise 

## 2021-05-05 ENCOUNTER — Ambulatory Visit: Payer: Medicare PPO | Admitting: Rheumatology

## 2021-05-05 ENCOUNTER — Ambulatory Visit: Payer: Self-pay

## 2021-05-05 ENCOUNTER — Encounter: Payer: Self-pay | Admitting: Rheumatology

## 2021-05-05 ENCOUNTER — Other Ambulatory Visit: Payer: Self-pay

## 2021-05-05 VITALS — BP 134/79 | HR 55 | Resp 12 | Ht 74.0 in | Wt 183.3 lb

## 2021-05-05 DIAGNOSIS — I5189 Other ill-defined heart diseases: Secondary | ICD-10-CM

## 2021-05-05 DIAGNOSIS — M19042 Primary osteoarthritis, left hand: Secondary | ICD-10-CM | POA: Diagnosis not present

## 2021-05-05 DIAGNOSIS — M5030 Other cervical disc degeneration, unspecified cervical region: Secondary | ICD-10-CM

## 2021-05-05 DIAGNOSIS — M19071 Primary osteoarthritis, right ankle and foot: Secondary | ICD-10-CM

## 2021-05-05 DIAGNOSIS — I10 Essential (primary) hypertension: Secondary | ICD-10-CM

## 2021-05-05 DIAGNOSIS — Z86010 Personal history of colonic polyps: Secondary | ICD-10-CM

## 2021-05-05 DIAGNOSIS — I639 Cerebral infarction, unspecified: Secondary | ICD-10-CM

## 2021-05-05 DIAGNOSIS — M5136 Other intervertebral disc degeneration, lumbar region: Secondary | ICD-10-CM

## 2021-05-05 DIAGNOSIS — M1712 Unilateral primary osteoarthritis, left knee: Secondary | ICD-10-CM

## 2021-05-05 DIAGNOSIS — M19041 Primary osteoarthritis, right hand: Secondary | ICD-10-CM | POA: Diagnosis not present

## 2021-05-05 DIAGNOSIS — Z87891 Personal history of nicotine dependence: Secondary | ICD-10-CM

## 2021-05-05 DIAGNOSIS — R911 Solitary pulmonary nodule: Secondary | ICD-10-CM | POA: Diagnosis not present

## 2021-05-05 DIAGNOSIS — G834 Cauda equina syndrome: Secondary | ICD-10-CM

## 2021-05-05 DIAGNOSIS — M722 Plantar fascial fibromatosis: Secondary | ICD-10-CM

## 2021-05-05 DIAGNOSIS — Z87442 Personal history of urinary calculi: Secondary | ICD-10-CM

## 2021-05-05 DIAGNOSIS — L719 Rosacea, unspecified: Secondary | ICD-10-CM

## 2021-05-05 DIAGNOSIS — L409 Psoriasis, unspecified: Secondary | ICD-10-CM

## 2021-05-05 DIAGNOSIS — R768 Other specified abnormal immunological findings in serum: Secondary | ICD-10-CM | POA: Diagnosis not present

## 2021-05-05 DIAGNOSIS — M19072 Primary osteoarthritis, left ankle and foot: Secondary | ICD-10-CM

## 2021-05-05 NOTE — Telephone Encounter (Signed)
Spoke with patient made him an appt to come in on Monday at 345pm , pt is okay with this appt.

## 2021-05-08 ENCOUNTER — Other Ambulatory Visit: Payer: Self-pay

## 2021-05-08 ENCOUNTER — Encounter: Payer: Self-pay | Admitting: Urology

## 2021-05-08 ENCOUNTER — Ambulatory Visit: Payer: Medicare PPO | Admitting: Urology

## 2021-05-08 VITALS — BP 123/75 | HR 72 | Temp 98.8°F | Wt 183.0 lb

## 2021-05-08 DIAGNOSIS — N50812 Left testicular pain: Secondary | ICD-10-CM | POA: Diagnosis not present

## 2021-05-08 DIAGNOSIS — R339 Retention of urine, unspecified: Secondary | ICD-10-CM | POA: Diagnosis not present

## 2021-05-08 NOTE — Progress Notes (Signed)
05/08/2021 4:31 PM   Russell [name] [birth year] [id number]  Referring provider: Marrian Salvage, [address] [place],  [place] [address]  Left testis pain   HPI: [name] is a 75yo here for evaluation of new onset left testis pain. Last week he was picking up plywood and developed left testis pain with associated left inguinal bulge. The pain has improved since last week. He denies any LUTS. No hematuria or dysuria. UA normal. Bowel movements normal. No nausea/vomiting. PVR 94. No other associated symptoms. No exacerbating/alleviating events.    PMH: Past Medical History:  Diagnosis Date   Anemia 2019   Arachnoiditis    Arthritis    Bleeding internal hemorrhoids 09/10/2006   Qualifier: Diagnosis of  By: Jonna Munro MD, Cornelius     Cauda equina syndrome [facility]) 2011   Cerebellar infarct [facility])    Cervical spine degeneration    Constipation    Enlarged prostate    GERD (gastroesophageal reflux disease)    Hemorrhoids    Hx of colonic polyp 05/29/2018   05/2018 cecal ssp   Kidney stone    Remote   Maxillary sinus polyp    Prolapsed internal hemorrhoids, grade 3 09/10/2006   Qualifier: Diagnosis of  By: Jonna Munro MD, Cornelius     Salivary gland enlargement    Sleep apnea 10 years ago   declined CPAP   Stroke ([place]) 15-20 years ago    Surgical History: Past Surgical History:  Procedure Laterality Date   COLONOSCOPY     HEMORRHOID BANDING     LUMBAR LAMINECTOMY  03/08/2010   L2-3, cauda equina syndrome   UPPER GI ENDOSCOPY      Home Medications:  Allergies as of 05/08/2021   No Known Allergies      Medication List        Accurate as of May 08, 2021  4:31 PM. If you have any questions, ask your nurse or doctor.          aspirin 81 MG EC tablet Take by mouth.   clotrimazole-betamethasone cream Commonly known as: Lotrisone Apply 1 application topically 2 (two) times daily.   finasteride 5 MG tablet Commonly known  as: PROSCAR Take 1 tablet (5 mg total) by mouth daily.   Fish Oil 1200 MG Caps Take 1 capsule by mouth 2 (two) times daily.   GINGER PO Take 4 mg by mouth daily.   magnesium 30 MG tablet Take 30 mg by mouth 2 (two) times daily.   MISC NATURAL PRODUCTS PO Take 1 capsule by mouth daily. Nettle Extract   omeprazole 40 MG capsule Commonly known as: PRILOSEC Take by mouth.   polyethylene glycol powder 17 GM/SCOOP powder Commonly known as: GLYCOLAX/MIRALAX Take by mouth.   senna-docusate 8.6-50 MG tablet Commonly known as: Senokot-S Take 2 tablets by mouth daily.   silodosin 8 MG Caps capsule Commonly known as: RAPAFLO Take 1 capsule (8 mg total) by mouth daily with breakfast.   Soolantra 1 % Crea Generic drug: Ivermectin as needed.   Tart Cherry 1200 MG Caps Take 3 capsules by mouth.   TURMERIC PO Take 500 mg by mouth daily.        Allergies: No Known Allergies  Family History: Family History  Problem Relation Age of Onset   Aortic aneurysm Mother    Heart attack Father    Aortic aneurysm Sister    CVA Sister    Aortic aneurysm Sister    Aortic aneurysm Sister  Colon cancer Neg Hx    Pancreatic cancer Neg Hx    Rectal cancer Neg Hx    Stomach cancer Neg Hx     Social History:  reports that he quit smoking about 46 years ago. His smoking use included cigarettes. He started smoking about 55 years ago. He smoked an average of .25 packs per day. He has never used smokeless tobacco. He reports that he does not currently use alcohol. He reports that he does not use drugs.  ROS: All other review of systems were reviewed and are negative except what is noted above in HPI  Physical Exam: BP 123/75   Pulse 72   Temp 98.8 F (37.1 C)   Wt 183 lb (83 kg)   BMI 23.50 kg/m   Constitutional:  Alert and oriented, No acute distress. HEENT: [place] AT, moist mucus membranes.  Trachea midline, no masses. Cardiovascular: No clubbing, cyanosis, or edema. Respiratory:  Normal respiratory effort, no increased work of breathing. GI: Abdomen is soft, nontender, nondistended, no abdominal masses GU: No CVA tenderness. Circumcised phallus. No masses/lesions on penis, testis, scrotum.   Lymph: No cervical or inguinal lymphadenopathy. Skin: No rashes, bruises or suspicious lesions. Neurologic: Grossly intact, no focal deficits, moving all 4 extremities. Psychiatric: Normal mood and affect.  Laboratory Data: Lab Results  Component Value Date   WBC 5.9 09/28/2019   HGB 12.7 (L) 09/28/2019   HCT 39.3 09/28/2019   MCV 79.2 09/28/2019   PLT 207.0 09/28/2019    Lab Results  Component Value Date   CREATININE 0.69 09/28/2019    Lab Results  Component Value Date   PSA 1.95 09/28/2019   PSA 1.10 02/10/2009    Lab Results  Component Value Date   TESTOSTERONE 467.03 09/12/2006    Lab Results  Component Value Date   HGBA1C 5.6 09/29/2019    Urinalysis    Component Value Date/Time   COLORURINE YELLOW 03/07/2010 2315   APPEARANCEUR Clear 03/08/2021 0932   LABSPEC 1.021 03/07/2010 2315   PHURINE 6.0 03/07/2010 2315   GLUCOSEU Negative 03/08/2021 0932   HGBUR NEGATIVE 03/07/2010 2315   BILIRUBINUR Negative 03/08/2021 0932   KETONESUR NEGATIVE 03/07/2010 2315   PROTEINUR Negative 03/08/2021 0932   PROTEINUR NEGATIVE 03/07/2010 2315   UROBILINOGEN 1.0 03/20/2018 1406   UROBILINOGEN 0.2 03/07/2010 2315   NITRITE Negative 03/08/2021 0932   NITRITE NEGATIVE 03/07/2010 2315   LEUKOCYTESUR Trace (A) 03/08/2021 0932    Lab Results  Component Value Date   LABMICR See below: 03/08/2021   WBCUA None seen 03/08/2021   LABEPIT 0-10 03/08/2021   BACTERIA None seen 03/08/2021    Pertinent Imaging:  No results found for this or any previous visit.  No results found for this or any previous visit.  No results found for this or any previous visit.  No results found for this or any previous visit.  No results found for this or any previous  visit.  No results found for this or any previous visit.  No results found for this or any previous visit.  No results found for this or any previous visit.   Assessment & Plan:    1. 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 Patients symptoms are improving and we will proceed with scrotal support and NSAIDS   No follow-ups on file.  Nicolette Bang, MD  [facility] [place]

## 2021-05-08 NOTE — Progress Notes (Signed)
post void residual 94 ml

## 2021-05-08 NOTE — Progress Notes (Signed)
blaUrological Symptom Review  Patient is experiencing the following symptoms: Frequent urination Stream starts and stops Weak stream Erection problems (male only)   Review of Systems  Gastrointestinal (upper)  : Negative for upper GI symptoms  Gastrointestinal (lower) : Constipation  Constitutional : Negative for symptoms  Skin: Skin rash/lesion  Eyes: Negative for eye symptoms  Ear/Nose/Throat : Negative for Ear/Nose/Throat symptoms  Hematologic/Lymphatic: Negative for Hematologic/Lymphatic symptoms  Cardiovascular : Negative for cardiovascular symptoms  Respiratory : Negative for respiratory symptoms  Endocrine: Negative for endocrine symptoms  Musculoskeletal: Back pain Joint pain  Neurological: Negative for neurological symptoms  Psychologic: Negative for psychiatric symptoms

## 2021-05-08 NOTE — Addendum Note (Signed)
Addended by: Iris Pert on: 05/08/2021 04:56 PM   Modules accepted: Orders

## 2021-05-09 ENCOUNTER — Ambulatory Visit (HOSPITAL_COMMUNITY)
Admission: RE | Admit: 2021-05-09 | Discharge: 2021-05-09 | Disposition: A | Discharge status: Home / Self Care | Payer: Medicare PPO | Source: Ambulatory Visit | Attending: Pain Medicine | Admitting: Pain Medicine

## 2021-05-09 DIAGNOSIS — M549 Dorsalgia, unspecified: Secondary | ICD-10-CM | POA: Diagnosis not present

## 2021-05-09 LAB — URINALYSIS, ROUTINE W REFLEX MICROSCOPIC
Bilirubin, UA: NEGATIVE
Glucose, UA: NEGATIVE
Ketones, UA: NEGATIVE
Leukocytes,UA: NEGATIVE
Nitrite, UA: NEGATIVE
Protein,UA: NEGATIVE
RBC, UA: NEGATIVE
Specific Gravity, UA: 1.02 (ref 1.005–1.030)
Urobilinogen, Ur: 1 mg/dL (ref 0.2–1.0)
pH, UA: 7.5 (ref 5.0–7.5)

## 2021-05-11 ENCOUNTER — Ambulatory Visit
Admission: RE | Admit: 2021-05-11 | Discharge: 2021-05-11 | Disposition: A | Discharge status: Home / Self Care | Payer: Medicare PPO | Source: Ambulatory Visit | Attending: Physician Assistant | Admitting: Physician Assistant

## 2021-05-11 ENCOUNTER — Other Ambulatory Visit: Payer: Self-pay

## 2021-05-11 VITALS — BP 127/76 | HR 57 | Temp 98.4°F | Resp 17

## 2021-05-11 DIAGNOSIS — Z1152 Encounter for screening for COVID-19: Secondary | ICD-10-CM

## 2021-05-11 NOTE — ED Triage Notes (Signed)
Patient presents for COVID testing today.   Patient has no complaints.

## 2021-05-11 NOTE — Telephone Encounter (Signed)
PCC"s  pt is stating that he is to have bronch on 11/1 but he is not sure what time he needs to be there.  Any ideas on this?  thanks

## 2021-05-12 ENCOUNTER — Encounter (HOSPITAL_COMMUNITY): Payer: Self-pay | Admitting: Pulmonary Disease

## 2021-05-12 LAB — SARS-COV-2, NAA 2 DAY TAT

## 2021-05-12 LAB — NOVEL CORONAVIRUS, NAA: SARS-CoV-2, NAA: NOT DETECTED

## 2021-05-12 NOTE — Progress Notes (Signed)
DUE TO COVID-19 ONLY ONE VISITOR IS ALLOWED TO COME WITH YOU AND STAY IN THE WAITING ROOM ONLY DURING PRE OP AND PROCEDURE DAY OF SURGERY. T  Internal Med - Marrian Salvage, FNP Cardiologist - n/a Saw Dr Renie Ora on 10/08/19 Gastro - Dr Silvano Rusk Pulmonary - Dr Freda Jackson Rheumatology - Dr Bo Merino Urology - Dr Nicolette Bang  CT Chest x-ray - 03/21/21 EKG - DOS 05/16/21 Gastro - Dr Silvano Rusk Stress Test - 10/23/16 ECHO - 11/16/15 Cardiac Cath - n/a  ICD Pacemaker/Loop - n/a  Sleep Study -  Yes, greater than 10 yrs ago CPAP - patient declined cpap  Aspirin Instructions: Follow your surgeon's instructions on when to stop aspirin prior to surgery,  Last dose was on 05/14/21.  Anesthesia review: Yes.  STOP now taking any Aspirin (unless otherwise instructed by your surgeon), Aleve, Naproxen, Ibuprofen, Motrin, Advil, Goody's, BC's, all herbal medications, fish oil, and all vitamins.   Coronavirus Screening Covid test on 05/11/21 is outdated and will need to be retested pm DOS.  Test on 05/11/21 was negative.  Do you have any of the following symptoms:  Cough yes/no: No Fever (>100.3F)  yes/no: No Runny nose yes/no: No Sore throat yes/no: No Difficulty breathing/shortness of breath  yes/no: No  Have you traveled in the last 14 days and where? yes/no: No  Patient verbalized understanding of instructions that were given via phone.

## 2021-05-15 ENCOUNTER — Telehealth: Payer: Self-pay

## 2021-05-15 ENCOUNTER — Other Ambulatory Visit: Payer: Self-pay

## 2021-05-15 ENCOUNTER — Encounter (HOSPITAL_COMMUNITY): Payer: Self-pay | Admitting: Pulmonary Disease

## 2021-05-15 NOTE — Telephone Encounter (Addendum)
"   have already done the covid test in [place]. Results have been posted on mychart already. I am not happy at all in being asked to leave home at 5:45 am to reach my appointment on time. We have animals to tend each morning .  I also have not been told how long the procedure will be, how long I must stay to recover, or if I am able to drive myself. It is my understanding someone, my wife in my case, must drive me home after the procedure. Is there no later time to do this?"  Called and spoke to patient who is upset about having to arrive at 6:15 for a covid test since he was covid tested last Thursday. States someone in office permitted him to get the test done on Thursday but cant remember who it was. States that he will not be doing the procedure if he has to arrive at 6:15 am to get covid tested. Patient is asking to speak directly to Dr. Valeta Harms. Appt is scheduled for tomorrow at 9:15 but instructed to arrive at 6:15 for covid testing.     Dr. Valeta Harms patient would like to talk to you directly today.

## 2021-05-15 NOTE — Telephone Encounter (Signed)
Called and spoke to patient regarding Dr. Fabio Bering note. He verified he will be at the appt. Nothing furhter needed at this time.

## 2021-05-15 NOTE — Anesthesia Preprocedure Evaluation (Addendum)
Anesthesia Evaluation  Patient identified by MRN, date of birth, ID band Patient awake    Reviewed: Allergy & Precautions, H&P , NPO status , Patient's Chart, lab work & pertinent test results  Airway Mallampati: II  TM Distance: >3 FB Neck ROM: Full    Dental no notable dental hx. (+) Teeth Intact, Caps, Dental Advisory Given   Pulmonary neg pulmonary ROS, sleep apnea , former smoker,    Pulmonary exam normal breath sounds clear to auscultation       Cardiovascular Exercise Tolerance: Good hypertension, Pt. on medications negative cardio ROS Normal cardiovascular exam Rhythm:Regular Rate:Normal     Neuro/Psych  Headaches,  Neuromuscular disease CVA negative neurological ROS  negative psych ROS   GI/Hepatic negative GI ROS, Neg liver ROS, GERD  ,  Endo/Other  negative endocrine ROS  Renal/GU negative Renal ROS  negative genitourinary   Musculoskeletal negative musculoskeletal ROS (+)   Abdominal   Peds negative pediatric ROS (+)  Hematology negative hematology ROS (+) Blood dyscrasia, anemia ,   Anesthesia Other Findings   Reproductive/Obstetrics negative OB ROS                           Anesthesia Physical Anesthesia Plan  ASA: 3  Anesthesia Plan: General   Post-op Pain Management:    Induction:   PONV Risk Score and Plan: 2 and Ondansetron  Airway Management Planned: Oral ETT  Additional Equipment: None  Intra-op Plan:   Post-operative Plan: Extubation in OR  Informed Consent: I have reviewed the patients History and Physical, chart, labs and discussed the procedure including the risks, benefits and alternatives for the proposed anesthesia with the patient or authorized representative who has indicated his/her understanding and acceptance.       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  Evaluated by cardiology in 2018 for episode of chest discomfort.  Exercise tolerance test was normal and he was advised to follow-up with cardiology on an as-needed basis.  History of OSA, unable to tolerate CPAP.  History of C3-5 ACDF.  Will need day of surgery labs and evaluation.  CT chest 03/21/2021: IMPRESSION: Mild increase in size and density of 2.7 cm sub-solid pulmonary nodule in posterior left lower lobe, suspicious for low-grade adenocarcinoma. Surgical resection should be considered. Recommend referral to the [facility] multi disciplinary thoracic oncology clinic. This recommendation follows the consensus statement: Guidelines for Management of Small Pulmonary Nodules Detected on CT Images: From the Fleischner Society 2017; Radiology 2017; 284:228-243.  No evidence of thoracic metastatic disease.  Chronic interstitial lung disease.  Exercise tolerance test 10/23/2016: . Blood pressure demonstrated a hypertensive response to exercise. . There was no ST segment deviation noted during stress. . Normal exercise treadmill stress test. . Duke treadmill score predicts a low risk for major cardiac events.  TTE 11/16/2015: - Left ventricle: The cavity size was normal. Wall thickness was  normal. Systolic function was vigorous. The estimated ejection  fraction was in the range of 65% to 70%. Wall motion was normal;  there were no regional wall motion abnormalities. Doppler  parameters are consistent with abnormal left ventricular  relaxation (grade 1 diastolic dysfunction). The E/e&' ratio is <8,  suggesting normal LV filling pressure.  - Aortic valve: Trileaflet. There  was mild regurgitation. Valve  area (VTI): 2.96 cm^2. Valve area (Vmax): 3.16 cm^2. Valve area  (Vmean): 3.08 cm^2.  - Mitral valve: Mildly thickened  leaflets . There was trivial  regurgitation.  - Left atrium: The atrium was at the upper limits of normal in  size.  - Right atrium: The atrium was mildly dilated.  - Tricuspid valve: There was trivial regurgitation.  - Pulmonary arteries: PA peak pressure: 21 mm Hg (S).  - Inferior vena cava: The vessel was normal in size. The  respirophasic diameter changes were in the normal range (>= 50%),  consistent with normal central venous pressure.   Impressions:   - LVEF 65-70%, normal wall thickness, diastolic dysfunction, normal  LV filling pressure, mild AI, trivial MR, upper normal LA size,  mild RAE, trivial TR, normal RVSP, normal IVC.   )       Anesthesia Quick Evaluation

## 2021-05-15 NOTE — Progress Notes (Signed)
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  Evaluated by cardiology in 2018 for episode of chest discomfort.  Exercise tolerance test was normal and he was advised to follow-up with cardiology on an as-needed basis.  History of OSA, unable to tolerate CPAP.  History of C3-5 ACDF.  Will need day of surgery labs and evaluation.  CT chest 03/21/2021: IMPRESSION: Mild increase in size and density of 2.7 cm sub-solid pulmonary nodule in posterior left lower lobe, suspicious for low-grade adenocarcinoma. Surgical resection should be considered. Recommend referral to the [facility] multi disciplinary thoracic oncology clinic. This recommendation follows the consensus statement: Guidelines for Management of Small Pulmonary Nodules Detected on CT Images: From the Fleischner Society 2017; Radiology 2017; 284:228-243.   No evidence of thoracic metastatic disease.   Chronic interstitial lung disease.  Exercise tolerance test 10/23/2016: Blood pressure demonstrated a hypertensive response to exercise. There was no ST segment deviation noted during stress. Normal exercise treadmill stress test. Duke treadmill score predicts a low risk for major cardiac events.  TTE 11/16/2015: - Left ventricle: The cavity size was normal. Wall thickness was    normal. Systolic function was vigorous. The estimated ejection    fraction was in the range of 65% to 70%. Wall motion was normal;    there were no regional wall motion abnormalities. Doppler    parameters are consistent with abnormal left ventricular    relaxation (grade 1 diastolic dysfunction). The E/e&' ratio is <8,    suggesting normal LV filling pressure.  - Aortic valve: Trileaflet. There was mild regurgitation. Valve    area (VTI): 2.96 cm^2. Valve area (Vmax): 3.16 cm^2. Valve area     (Vmean): 3.08 cm^2.  - Mitral valve: Mildly thickened leaflets . There was trivial    regurgitation.  - Left atrium: The atrium was at the upper limits of normal in    size.  - Right atrium: The atrium was mildly dilated.  - Tricuspid valve: There was trivial regurgitation.  - Pulmonary arteries: PA peak pressure: 21 mm Hg (S).  - Inferior vena cava: The vessel was normal in size. The    respirophasic diameter changes were in the normal range (>= 50%),    consistent with normal central venous pressure.   Impressions:   - LVEF 65-70%, normal wall thickness, diastolic dysfunction, normal    LV filling pressure, mild AI, trivial MR, upper normal LA size,    mild RAE, trivial TR, normal RVSP, normal IVC.     Wynonia Musty [facility] Short Stay Center/Anesthesiology Phone [phone] 05/15/2021 1:08 PM

## 2021-05-16 ENCOUNTER — Ambulatory Visit (HOSPITAL_COMMUNITY): Payer: Medicare PPO | Admitting: Physician Assistant

## 2021-05-16 ENCOUNTER — Encounter (HOSPITAL_COMMUNITY): Payer: Self-pay | Admitting: Pulmonary Disease

## 2021-05-16 ENCOUNTER — Ambulatory Visit (HOSPITAL_COMMUNITY)
Admission: RE | Admit: 2021-05-16 | Discharge: 2021-05-16 | Disposition: A | Discharge status: Home / Self Care | Payer: Medicare PPO | Source: Ambulatory Visit | Attending: Pulmonary Disease | Admitting: Pulmonary Disease

## 2021-05-16 ENCOUNTER — Ambulatory Visit (HOSPITAL_COMMUNITY): Payer: Medicare PPO

## 2021-05-16 ENCOUNTER — Other Ambulatory Visit: Payer: Self-pay

## 2021-05-16 ENCOUNTER — Encounter (HOSPITAL_COMMUNITY)
Admission: RE | Disposition: A | Discharge status: Home / Self Care | Payer: Self-pay | Source: Ambulatory Visit | Attending: Pulmonary Disease

## 2021-05-16 DIAGNOSIS — Z419 Encounter for procedure for purposes other than remedying health state, unspecified: Secondary | ICD-10-CM

## 2021-05-16 DIAGNOSIS — R911 Solitary pulmonary nodule: Secondary | ICD-10-CM

## 2021-05-16 DIAGNOSIS — C3432 Malignant neoplasm of lower lobe, left bronchus or lung: Secondary | ICD-10-CM | POA: Insufficient documentation

## 2021-05-16 DIAGNOSIS — Z9889 Other specified postprocedural states: Secondary | ICD-10-CM

## 2021-05-16 DIAGNOSIS — Z87891 Personal history of nicotine dependence: Secondary | ICD-10-CM | POA: Insufficient documentation

## 2021-05-16 DIAGNOSIS — J849 Interstitial pulmonary disease, unspecified: Secondary | ICD-10-CM | POA: Diagnosis not present

## 2021-05-16 HISTORY — PX: BRONCHIAL BIOPSY: SHX5109

## 2021-05-16 HISTORY — PX: BRONCHIAL BRUSHINGS: SHX5108

## 2021-05-16 HISTORY — DX: Personal history of urinary calculi: Z87.442

## 2021-05-16 HISTORY — PX: VIDEO BRONCHOSCOPY WITH RADIAL ENDOBRONCHIAL ULTRASOUND: SHX6849

## 2021-05-16 HISTORY — PX: VIDEO BRONCHOSCOPY WITH ENDOBRONCHIAL NAVIGATION: SHX6175

## 2021-05-16 HISTORY — PX: FIDUCIAL MARKER PLACEMENT: SHX6858

## 2021-05-16 HISTORY — PX: BRONCHIAL NEEDLE ASPIRATION BIOPSY: SHX5106

## 2021-05-16 HISTORY — DX: Other amnesia: R41.3

## 2021-05-16 LAB — BASIC METABOLIC PANEL
Anion gap: 7 (ref 5–15)
BUN: 18 mg/dL (ref 8–23)
CO2: 25 mmol/L (ref 22–32)
Calcium: 8.9 mg/dL (ref 8.9–10.3)
Chloride: 105 mmol/L (ref 98–111)
Creatinine, Ser: 0.68 mg/dL (ref 0.61–1.24)
GFR, Estimated: >60 mL/min (ref >60)
Glucose, Bld: 97 mg/dL (ref 70–99)
Potassium: 3.8 mmol/L (ref 3.5–5.1)
Sodium: 137 mmol/L (ref 135–145)

## 2021-05-16 LAB — CBC
HCT: 40.5 % (ref 39.0–52.0)
Hemoglobin: 12.7 g/dL — ABNORMAL LOW (ref 13.0–17.0)
MCH: 25.5 pg — ABNORMAL LOW (ref 26.0–34.0)
MCHC: 31.4 g/dL (ref 30.0–36.0)
MCV: 81.3 fL (ref 80.0–100.0)
Platelets: 201 10*3/uL (ref 150–400)
RBC: 4.98 MIL/uL (ref 4.22–5.81)
RDW: 14.4 % (ref 11.5–15.5)
WBC: 5 10*3/uL (ref 4.0–10.5)
nRBC: 0 % (ref 0.0–0.2)

## 2021-05-16 SURGERY — VIDEO BRONCHOSCOPY WITH ENDOBRONCHIAL NAVIGATION
Anesthesia: General | Laterality: Left

## 2021-05-16 MED ORDER — ACETAMINOPHEN 160 MG/5ML PO SOLN: 325.0000 mg | ORAL | Status: DC | PRN

## 2021-05-16 MED ORDER — MEPERIDINE HCL 100 MG/ML IJ SOLN: 6.2500 mg | INTRAMUSCULAR | Status: DC | PRN

## 2021-05-16 MED ORDER — OXYCODONE HCL 5 MG PO TABS: 5.0000 mg | ORAL_TABLET | Freq: Once | ORAL | Status: DC | PRN

## 2021-05-16 MED ORDER — CHLORHEXIDINE GLUCONATE 0.12 % MT SOLN
OROMUCOSAL | Status: AC
  Administered 2021-05-16: 15 mL via OROMUCOSAL
  Filled 2021-05-16: qty 15

## 2021-05-16 MED ORDER — FENTANYL CITRATE (PF) 100 MCG/2ML IJ SOLN: 25.0000 ug | INTRAMUSCULAR | Status: DC | PRN

## 2021-05-16 MED ORDER — FENTANYL CITRATE (PF) 100 MCG/2ML IJ SOLN
INTRAMUSCULAR | Status: DC | PRN
  Administered 2021-05-16: 100 ug via INTRAVENOUS

## 2021-05-16 MED ORDER — CHLORHEXIDINE GLUCONATE 0.12 % MT SOLN: 15.0000 mL | Freq: Once | OROMUCOSAL | Status: AC

## 2021-05-16 MED ORDER — DEXAMETHASONE SODIUM PHOSPHATE 4 MG/ML IJ SOLN
INTRAMUSCULAR | Status: DC | PRN
  Administered 2021-05-16: 10 mg via INTRAVENOUS

## 2021-05-16 MED ORDER — LACTATED RINGERS IV SOLN: INTRAVENOUS | Status: DC

## 2021-05-16 MED ORDER — PROPOFOL 10 MG/ML IV BOLUS
INTRAVENOUS | Status: DC | PRN
  Administered 2021-05-16: 200 mg via INTRAVENOUS

## 2021-05-16 MED ORDER — ONDANSETRON HCL 4 MG/2ML IJ SOLN
INTRAMUSCULAR | Status: DC | PRN
Start: 2021-05-16 — End: 2021-05-16
  Administered 2021-05-16: 4 mg via INTRAVENOUS

## 2021-05-16 MED ORDER — SUCCINYLCHOLINE CHLORIDE 200 MG/10ML IV SOSY
PREFILLED_SYRINGE | INTRAVENOUS | Status: DC | PRN
  Administered 2021-05-16: 140 mg via INTRAVENOUS

## 2021-05-16 MED ORDER — PHENYLEPHRINE 40 MCG/ML (10ML) SYRINGE FOR IV PUSH (FOR BLOOD PRESSURE SUPPORT)
PREFILLED_SYRINGE | INTRAVENOUS | Status: DC | PRN
  Administered 2021-05-16 (×2): 80 ug via INTRAVENOUS

## 2021-05-16 MED ORDER — ONDANSETRON HCL 4 MG/2ML IJ SOLN: 4.0000 mg | Freq: Once | INTRAMUSCULAR | Status: DC | PRN

## 2021-05-16 MED ORDER — EPHEDRINE SULFATE-NACL 50-0.9 MG/10ML-% IV SOSY
PREFILLED_SYRINGE | INTRAVENOUS | Status: DC | PRN
  Administered 2021-05-16: 5 mg via INTRAVENOUS
  Administered 2021-05-16: 10 mg via INTRAVENOUS
  Administered 2021-05-16: 5 mg via INTRAVENOUS

## 2021-05-16 MED ORDER — SUGAMMADEX SODIUM 200 MG/2ML IV SOLN
INTRAVENOUS | Status: DC | PRN
  Administered 2021-05-16: 200 mg via INTRAVENOUS

## 2021-05-16 MED ORDER — LIDOCAINE 2% (20 MG/ML) 5 ML SYRINGE
INTRAMUSCULAR | Status: DC | PRN
  Administered 2021-05-16: 60 mg via INTRAVENOUS

## 2021-05-16 MED ORDER — ACETAMINOPHEN 325 MG PO TABS: 325.0000 mg | ORAL_TABLET | ORAL | Status: DC | PRN

## 2021-05-16 MED ORDER — OXYCODONE HCL 5 MG/5ML PO SOLN: 5.0000 mg | Freq: Once | ORAL | Status: DC | PRN

## 2021-05-16 MED ORDER — ROCURONIUM BROMIDE 10 MG/ML (PF) SYRINGE
PREFILLED_SYRINGE | INTRAVENOUS | Status: DC | PRN
  Administered 2021-05-16: 40 mg via INTRAVENOUS
  Administered 2021-05-16: 10 mg via INTRAVENOUS

## 2021-05-16 SURGICAL SUPPLY — 47 items

## 2021-05-16 NOTE — Op Note (Signed)
Video Bronchoscopy with Robotic Assisted Bronchoscopic Navigation   Date of Operation: 05/16/2021   Pre-op Diagnosis: Left lower lobe pulmonary nodule  Post-op Diagnosis: Left lower lobe pulmonary nodule  Surgeon: Garner Nash, DO  Assistants: None   Anesthesia: General endotracheal anesthesia  Operation: Flexible video fiberoptic bronchoscopy with robotic assistance and biopsies.  Estimated Blood Loss: Minimal  Complications: None  Indications and History: Russell [name] is a 75 y.o. male with history of LLL nodule. The risks, benefits, complications, treatment options and expected outcomes were discussed with the patient.  The possibilities of pneumothorax, pneumonia, reaction to medication, pulmonary aspiration, perforation of a viscus, bleeding, failure to diagnose a condition and creating a complication requiring transfusion or operation were discussed with the patient who freely signed the consent.    Description of Procedure: The patient was seen in the Preoperative Area, was examined and was deemed appropriate to proceed.  The patient was taken to [facility] endoscopy room 3, identified as Russell [name] and the procedure verified as Flexible Video Fiberoptic Bronchoscopy.  A Time Out was held and the above information confirmed.   Prior to the date of the procedure a high-resolution CT scan of the chest was performed. Utilizing ION software program a virtual tracheobronchial tree was generated to allow the creation of distinct navigation pathways to the patient's parenchymal abnormalities. After being taken to the operating room general anesthesia was initiated and the patient  was orally intubated. The video fiberoptic bronchoscope was introduced via the endotracheal tube and a general inspection was performed which showed normal right and left lung anatomy, aspiration of the bilateral mainstems was completed to remove any remaining secretions. Robotic catheter inserted into  patient's endotracheal tube.   Target #1 left lower lobe groundglass opacity: The distinct navigation pathways prepared prior to this procedure were then utilized to navigate to patient's lesion identified on CT scan. The robotic catheter was secured into place and the vision probe was withdrawn.  Lesion location was approximated using fluoroscopy and radial endobronchial ultrasound for peripheral targeting.  The lesion was located using mobile 3D CBCT imaging and the registration of the target within the robotic system was recalibrated to align with three-dimensional imaging for catheter placement. Under fluoroscopic guidance transbronchial needle brushings, transbronchial needle biopsies, and transbronchial forceps biopsies were performed to be sent for cytology and pathology.  Following tissue sampling under direct fluoroscopy a single fiducial was placed at approximately 2 cm from the near edge of the lesion using a fiducial catheter wire delivery kit.  A bronchioalveolar lavage was performed in the left lower lobe and sent for cultures.  At the end of the procedure a general airway inspection was performed and there was no evidence of active bleeding. The bronchoscope was removed.  The patient tolerated the procedure well. There was no significant blood loss and there were no obvious complications. A post-procedural chest x-ray is pending.  Samples Target #1: 1. Transbronchial needle brushings from LLL 2. Transbronchial Wang needle biopsies from LLL 3. Transbronchial forceps biopsies from LLL 4. Bronchoalveolar lavage from LLL  Plans:  The patient will be discharged from the PACU to home when recovered from anesthesia and after chest x-ray is reviewed. We will review the cytology, pathology and microbiology results with the patient when they become available. Outpatient followup will be with Octavio Graves Bobbette Eakes, DO.    Images: CBCT imaging with catheter tip at the start and near edge of the  lesion.   Garner Nash, DO [place]  Pulmonary Critical Care 05/16/2021 10:30 AM

## 2021-05-16 NOTE — Anesthesia Procedure Notes (Signed)
Procedure Name: Intubation Date/Time: 05/16/2021 9:26 AM Performed by: Lieutenant Diego, CRNA Pre-anesthesia Checklist: Patient identified, Emergency Drugs available, Suction available and Patient being monitored Patient Re-evaluated:Patient Re-evaluated prior to induction Oxygen Delivery Method: Circle system utilized Preoxygenation: Pre-oxygenation with 100% oxygen Induction Type: IV induction Ventilation: Mask ventilation without difficulty Laryngoscope Size: Miller and 2 Grade View: Grade I Tube type: Oral Tube size: 8.5 mm Number of attempts: 1 Airway Equipment and Method: Stylet Placement Confirmation: ETT inserted through vocal cords under direct vision, positive ETCO2 and breath sounds checked- equal and bilateral Secured at: 24 cm Tube secured with: Tape Dental Injury: Teeth and Oropharynx as per pre-operative assessment

## 2021-05-16 NOTE — Interval H&P Note (Signed)
History and Physical Interval Note:  05/16/2021 9:18 AM  Russell [name]  has presented today for surgery, with the diagnosis of lung nodule.  The various methods of treatment have been discussed with the patient and family. After consideration of risks, benefits and other options for treatment, the patient has consented to  Procedure(s) with comments: [place] (Left) - ION w/ fiducial placement as a surgical intervention.  The patient's history has been reviewed, patient examined, no change in status, stable for surgery.  I have reviewed the patient's chart and labs.  Questions were answered to the patient's satisfaction.     [place]

## 2021-05-16 NOTE — Anesthesia Postprocedure Evaluation (Signed)
Anesthesia Post Note  Patient: Russell [name]  Procedure(s) Performed: VIDEO BRONCHOSCOPY WITH ENDOBRONCHIAL NAVIGATION (Left) BRONCHIAL BIOPSIES BRONCHIAL BRUSHINGS BRONCHIAL NEEDLE ASPIRATION BIOPSIES FIDUCIAL MARKER PLACEMENT VIDEO BRONCHOSCOPY WITH RADIAL ENDOBRONCHIAL ULTRASOUND     Patient location during evaluation: PACU Anesthesia Type: General Level of consciousness: awake and alert Pain management: pain level controlled Vital Signs Assessment: post-procedure vital signs reviewed and stable Respiratory status: spontaneous breathing, nonlabored ventilation, respiratory function stable and patient connected to nasal cannula oxygen Cardiovascular status: blood pressure returned to baseline and stable Postop Assessment: no apparent nausea or vomiting Anesthetic complications: no   No notable events documented.  Last Vitals:  Vitals:   05/16/21 1045 05/16/21 1100  BP: 120/75 116/74  Pulse: 69 70  Resp: 20 18  Temp:  36.7 C  SpO2: 95% 96%    Last Pain:  Vitals:   05/16/21 1100  TempSrc:   PainSc: 0-No pain                 Olie Scaffidi

## 2021-05-16 NOTE — Discharge Instructions (Signed)
Flexible Bronchoscopy, Care After This sheet gives you information about how to care for yourself after your test. Your doctor may also give you more specific instructions. If you have problems or questions, contact your doctor. Follow these instructions at home: Eating and drinking Do not eat or drink anything (not even water) for 2 hours after your test, or until your numbing medicine (local anesthetic) wears off. When your numbness is gone and your cough and gag reflexes have come back, you may: Eat only soft foods. Slowly drink liquids. The day after the test, go back to your normal diet. Driving Do not drive for 24 hours if you were given a medicine to help you relax (sedative). Do not drive or use heavy machinery while taking prescription pain medicine. General instructions  Take over-the-counter and prescription medicines only as told by your doctor. Return to your normal activities as told. Ask what activities are safe for you. Do not use any products that have nicotine or tobacco in them. This includes cigarettes and e-cigarettes. If you need help quitting, ask your doctor. Keep all follow-up visits as told by your doctor. This is important. It is very important if you had a tissue sample (biopsy) taken. Get help right away if: You have shortness of breath that gets worse. You get light-headed. You feel like you are going to pass out (faint). You have chest pain. You cough up: More than a little blood. More blood than before. Summary Do not eat or drink anything (not even water) for 2 hours after your test, or until your numbing medicine wears off. Do not use cigarettes. Do not use e-cigarettes. Get help right away if you have chest pain.  This information is not intended to replace advice given to you by your health care provider. Make sure you discuss any questions you have with your health care provider. Document Released: 04/29/2009 Document Revised: 06/14/2017 Document  Reviewed: 07/20/2016 Elsevier Patient Education  2020 [employer].

## 2021-05-16 NOTE — H&P (Signed)
Synopsis: Referred in Nov 2022 for lung nodule by No ref. provider found  Subjective:   PATIENT ID: Russell [name] GENDER: male DOB: 10-11-1945, MRN: [id number]  Russell [name] is a 75 year old male, former smoker who returns to pulmonary clinic for pulmonary nodules and occupational related interstitial lung disease.   He had CT chest scan on 9/6 for nodule follow up which displayed an increase in size and density of a subpleural nodule of the left lower lobe, measuring 2.7 x 2.4cm previously measuring 2.4 x 1.8cm. There may be slight progression in the subpleural interstitial changes, greatest in the lower lobes.   His wife is currently ill with mild covid and he is starting to have sore throat and headache symptoms. Otherwise he has not noticed any changes in his respiratory status since last visit. No issues with dyspnea or cough.  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   anterior cer disc and fusion with plates C3-4 and X5-2 at [facility]   Constipation    Enlarged prostate    GERD (gastroesophageal reflux disease)    Hemorrhoids    History of kidney stones    passed stones   History of lumbar laminectomy 05/12/2020   open L2-L5 at [facility]   Hx of colonic polyp 05/29/2018   05/2018 cecal ssp   Lung nodule 02/2021   Maxillary sinus polyp    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   Prolapsed internal hemorrhoids, grade 3 09/10/2006   Qualifier: Diagnosis of  By: Jonna Munro MD, Cornelius     Salivary gland enlargement    Sleep apnea 10 years ago   declined CPAP   Stroke ([place]) 15-20 years ago     Family History  Problem Relation Age of Onset   Aortic aneurysm Mother    Heart  attack Father    Aortic aneurysm Sister    CVA Sister    Aortic aneurysm Sister    Aortic aneurysm Sister    Colon cancer Neg Hx    Pancreatic cancer Neg Hx    Rectal cancer Neg Hx    Stomach cancer Neg Hx      Past Surgical History:  Procedure Laterality Date   CERVICAL SPINE SURGERY  04/10/2020   C3-C4 with pin at [place]  03/08/2010   L2-3, cauda equina syndrome   lumbar laminectomy  05/12/2020   [facility]   UPPER GI ENDOSCOPY     wisdom teeth ext      Social History   Socioeconomic History   Marital status: Married    Spouse name: Not on file   Number of children: Not on file   Years of education: Not on file   Highest education level: Not on file  Occupational History   Occupation: retired  Tobacco Use   Smoking status: Former    Packs/day: 0.25    Types: Cigarettes    Start date: 10/15/1965    Quit date: 10/16/1974    Years since quitting: 46.6   Smokeless tobacco: Never  Vaping Use   Vaping Use:  Never used  Substance and Sexual Activity   Alcohol use: Not Currently    Comment: rarely wine   Drug use: No   Sexual activity: Yes  Other Topics Concern   Not on file  Social History Narrative   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   Social Determinants of Health   Financial Resource Strain: Not on file  Food Insecurity: Not on file  Transportation Needs: Not on file  Physical Activity: Not on file  Stress: Not on file  Social Connections: Not on file  Intimate Partner Violence: Not on file     No Known Allergies   @ENCMEDSTART @  Review of Systems  Constitutional:  Negative for chills, fever, malaise/fatigue and weight loss.  HENT:  Negative for hearing loss, sore throat and tinnitus.   Eyes:  Negative for blurred  vision and double vision.  Respiratory:  Positive for cough. Negative for hemoptysis, sputum production, shortness of breath, wheezing and stridor.   Cardiovascular:  Negative for chest pain, palpitations, orthopnea, leg swelling and PND.  Gastrointestinal:  Negative for abdominal pain, constipation, diarrhea, heartburn, nausea and vomiting.  Genitourinary:  Negative for dysuria, hematuria and urgency.  Musculoskeletal:  Negative for joint pain and myalgias.  Skin:  Negative for itching and rash.  Neurological:  Negative for dizziness, tingling, weakness and headaches.  Endo/Heme/Allergies:  Negative for environmental allergies. Does not bruise/bleed easily.  Psychiatric/Behavioral:  Negative for depression. The patient is not nervous/anxious and does not have insomnia.   All other systems reviewed and are negative.   Objective:  Physical Exam Vitals reviewed.  Constitutional:      General: He is not in acute distress.    Appearance: He is well-developed.  HENT:     Head: Normocephalic and atraumatic.  Eyes:     General: No scleral icterus.    Conjunctiva/sclera: Conjunctivae normal.     Pupils: Pupils are equal, round, and reactive to light.  Neck:     Vascular: No JVD.     Trachea: No tracheal deviation.  Cardiovascular:     Rate and Rhythm: Normal rate and regular rhythm.     Heart sounds: Normal heart sounds. No murmur heard. Pulmonary:     Effort: Pulmonary effort is normal. No tachypnea, accessory muscle usage or respiratory distress.     Breath sounds: Normal breath sounds. No stridor. No wheezing, rhonchi or rales.  Abdominal:     General: Bowel sounds are normal. There is no distension.     Palpations: Abdomen is soft.     Tenderness: There is no abdominal tenderness.  Musculoskeletal:        General: No tenderness.     Cervical back: Neck supple.  Lymphadenopathy:     Cervical: No cervical adenopathy.  Skin:    General: Skin is warm and dry.     Capillary  Refill: Capillary refill takes less than 2 seconds.     Findings: No rash.  Neurological:     Mental Status: He is alert and oriented to person, place, and time.  Psychiatric:        Behavior: Behavior normal.     Vitals:   05/15/21 1058 05/16/21 0726  BP:  128/78  Pulse:  60  Resp:  17  Temp:  97.6 F (36.4 C)  TempSrc:  Oral  SpO2:  93%  Weight: 80.7 kg 80.7 kg  Height: 6\' 2"  (1.88 m) 6\' 2"  (1.88 m)   93% on RA BMI Readings from Last 3 Encounters:  05/16/21 22.85 kg/m  05/08/21 23.50 kg/m  05/05/21 23.53 kg/m   Wt Readings from Last 3 Encounters:  05/16/21 80.7 kg  05/08/21 83 kg  05/05/21 83.1 kg     CBC    Component Value Date/Time   WBC 5.0 05/16/2021 0747   RBC 4.98 05/16/2021 0747   HGB 12.7 (L) 05/16/2021 0747   HCT 40.5 05/16/2021 0747   PLT 201 05/16/2021 0747   MCV 81.3 05/16/2021 0747   MCH 25.5 (L) 05/16/2021 0747   MCHC 31.4 05/16/2021 0747   RDW 14.4 05/16/2021 0747   LYMPHSABS 1.6 09/28/2019 1418   MONOABS 0.5 09/28/2019 1418   EOSABS 0.1 09/28/2019 1418   BASOSABS 0.0 09/28/2019 1418      Chest Imaging: CT Chest: enlarging LLL GGO nodule The patient's images have been independently reviewed by me.    Pulmonary Functions Testing Results: No flowsheet data found.  FeNO:   Pathology:   Echocardiogram:   Heart Catheterization:     Assessment & Plan:     ICD-10-CM   1. Lung nodule  R91.1    Added automatically from request for surgery [id number]    2. Surgery, elective  Z41.9 DG C-ARM BRONCHOSCOPY    DG C-ARM BRONCHOSCOPY      Discussion:  Discussed nodule and ct findings. Enlarging LLL GGO nodule   P: Discussed planned bronchoscopy  Discussed risks, benefits, alternatives Patient is agreeable to proceed.    Current Facility-Administered Medications:    lactated ringers infusion, , Intravenous, Continuous, Janeece Riggers, MD, Last Rate: 10 mL/hr at 05/16/21 0254, Continued from Pre-op at 05/16/21 [place], DO [facility] 05/16/2021 9:15 AM

## 2021-05-16 NOTE — Transfer of Care (Signed)
Immediate Anesthesia Transfer of Care Note  Patient: Russell [name]  Procedure(s) Performed: VIDEO BRONCHOSCOPY WITH ENDOBRONCHIAL NAVIGATION (Left) BRONCHIAL BIOPSIES BRONCHIAL BRUSHINGS BRONCHIAL NEEDLE ASPIRATION BIOPSIES FIDUCIAL MARKER PLACEMENT VIDEO BRONCHOSCOPY WITH RADIAL ENDOBRONCHIAL ULTRASOUND  Patient Location: PACU  Anesthesia Type:General  Level of Consciousness: awake and alert   Airway & Oxygen Therapy: Patient Spontanous Breathing and Patient connected to face mask oxygen  Post-op Assessment: Report given to RN and Post -op Vital signs reviewed and stable  Post vital signs: Reviewed and stable  Last Vitals:  Vitals Value Taken Time  BP 135/84 05/16/21 1029  Temp    Pulse 66 05/16/21 1031  Resp 19 05/16/21 1032  SpO2 98 % 05/16/21 1031  Vitals shown include unvalidated device data.  Last Pain:  Vitals:   05/16/21 0739  TempSrc:   PainSc: 0-No pain         Complications: No notable events documented.

## 2021-05-18 ENCOUNTER — Encounter (HOSPITAL_COMMUNITY): Payer: Self-pay | Admitting: Pulmonary Disease

## 2021-05-18 LAB — CULTURE, BAL-QUANTITATIVE W GRAM STAIN: Culture: NO GROWTH

## 2021-05-18 LAB — ACID FAST SMEAR (AFB, MYCOBACTERIA)
Acid Fast Smear: NEGATIVE
Acid Fast Smear: NEGATIVE

## 2021-05-18 LAB — FUNGUS STAIN

## 2021-05-18 LAB — FUNGAL STAIN REFLEX

## 2021-05-19 LAB — CYTOLOGY - NON PAP

## 2021-05-21 LAB — AEROBIC/ANAEROBIC CULTURE W GRAM STAIN (SURGICAL/DEEP WOUND): Culture: NO GROWTH

## 2021-05-22 ENCOUNTER — Encounter: Payer: Self-pay | Admitting: Acute Care

## 2021-05-22 ENCOUNTER — Ambulatory Visit: Payer: Medicare PPO | Admitting: Acute Care

## 2021-05-22 ENCOUNTER — Other Ambulatory Visit: Payer: Self-pay

## 2021-05-22 VITALS — BP 120/76 | HR 60 | Temp 98.1°F | Ht 74.0 in | Wt 183.2 lb

## 2021-05-22 DIAGNOSIS — C3490 Malignant neoplasm of unspecified part of unspecified bronchus or lung: Secondary | ICD-10-CM

## 2021-05-22 NOTE — Progress Notes (Signed)
Thoracic Location of Tumor / Histology: Left Lower Lobe Lung- Adenocarcinoma  Patient presented for follow-up scans of a nodule under surveillance  Bronchoscopy 05/16/2021  CT Chest 03/21/2021: Increase in size and density of a subpleural nodule of the left lower lobe, measuring 2.7 x 2.4 cm previously measuring 2.4 x 1.8 cm.  Biopsies of LLL Lung 05/16/2021    Tobacco/Marijuana/Snuff/ETOH use: Former Smoker, quit 10/16/1974   Past/Anticipated interventions by cardiothoracic surgery, if any:   Past/Anticipated interventions by Pulmonary, if any: Eric Form NP 05/22/2021 -Dr. Valeta Harms did a video assisted bronchoscopy with robotic assisted Bronchoscopic navigation on 05/16/2021. He did a LLL brushing, as well as a LLL fine needle aspiration. Both  were positive for malignant cells consistent with adenocarcinoma. -Fiducial was placed  at approximately 2 cm from the near edge of the lesion.  -We discussed this finding, and that options for treatment were surgical removal of the nodule or radiation . -Patient has had many extensive surgeries in the past. He does not want to be evaluated for surgery. He prefers referral to radiation oncology for SBRT.   Past/Anticipated interventions by medical oncology, if any:     Signs/Symptoms Weight changes, if any: Stable Respiratory complaints, if any: Going pretty good, notes mild lightheadedness with walking very long distances.  Denies any SOB. Hemoptysis, if any: He notes occasional cough first thing in the morning, then it subsides.  Denies hemoptysis. Pain issues, if any: No   SAFETY ISSUES: Prior radiation? No Pacemaker/ICD? No  Possible current pregnancy? N/a Is the patient on methotrexate? No  Current Complaints / other details:

## 2021-05-22 NOTE — Progress Notes (Signed)
History of Present Illness Russell [name] is a 75 y.o. 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 He is followed by Dr. Erin Fulling for his ILD , and Dr. Valeta Harms for his pulmonary nodule.     05/22/2021 Pt. Presents for follow up after navigational bronchoscopy to a subpleural nodule of the left lower lobe that had increased in both density and size  on imaging between  08/2020 and 03/2021.  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 Dr. Valeta Harms did a video assisted bronchoscopy with robotic assisted Bronchoscopic navigation on 05/16/2021. He did a LLL brushing, as well as a LLL fine needle aspiration. Both  were positive for malignant cells consistent with adenocarcinoma.Fiducial was placed  at approximately 2 cm from the near edge of the lesion. We discussed this finding, and that options for treatment were surgical removal of the nodule or radiation . Patient has had many extensive surgeries in the past. He does not want to be evaluated for surgery. He prefers referral to radiation oncology for SBRT.  Patient states he did have a slight sore throat after the procedure that has resolved. He had minimal hemoptysis post procedure that has resolved.   Test Results: Cytology 05/16/2021  FINAL MICROSCOPIC DIAGNOSIS:   A. LUNG, LLL, BRUSHING:  - Atypical cells present   B. LUNG, LLL, FINE NEEDLE ASPIRATION:  - Malignant cells consistent with adenocarcinoma   03/21/2021 CT Chest without contrast A persistent sub-solid pulmonary nodule is seen in the posterior left lower lobe which which shows mild increase in density in size since previous study. This currently measures 2.7 x 2.4 cm on image 150/4, compared to 2.4 x 1.8 cm previously. This is suspicious for low-grade adenocarcinoma Mild increase in size and density of 2.7 cm sub-solid  pulmonary nodule in posterior left lower lobe, suspicious for low-grade adenocarcinoma. Surgical resection should be considered  08/30/2020 CT Chest without Contrast Biapical scarring. Peripheral interstitial prominence throughout much of the right lung and in the left base most compatible with scarring. Calcified granuloma in the left lung base. Ground-glass nodule in the left lower lobe on image 139 measures 8 mm. 8 mm ground-glass nodular density at the left lung base. Non-contrast chest CT at 6-12 months is recommended. If the nodule is stable at time of repeat CT, then future CT at 18-24 months (from today's scan) is considered optional for low-risk patients, but is recommended for high-risk patients.  CBC Latest Ref Rng & Units 05/16/2021 09/28/2019 06/13/2018  WBC 4.0 - 10.5 K/uL 5.0 5.9 13.8(H)  Hemoglobin 13.0 - 17.0 g/dL 12.7(L) 12.7(L) 12.9(L)  Hematocrit 39.0 - 52.0 % 40.5 39.3 41.4  Platelets 150 - 400 K/uL 201 207.0 212    BMP Latest Ref Rng & Units 05/16/2021 09/28/2019 06/13/2018  Glucose 70 - 99 mg/dL 97 78 114(H)  BUN 8 - 23 mg/dL 18 16 11   Creatinine 0.61 - 1.24 mg/dL 0.68 0.69 0.82  Sodium 135 - 145 mmol/L 137 139 136  Potassium 3.5 - 5.1 mmol/L 3.8 3.9 4.1  Chloride 98 - 111 mmol/L 105 102 100  CO2 22 - 32 mmol/L 25 30 27   Calcium 8.9 - 10.3 mg/dL 8.9 9.2 9.2    BNP No results found for: BNP  ProBNP No results found for: PROBNP  PFT No results found for: FEV1PRE, FEV1POST, FVCPRE, FVCPOST, TLC,  DLCOUNC, PREFEV1FVCRT, PSTFEV1FVCRT  MR THORACIC SPINE WO CONTRAST  Result Date: 05/11/2021 CLINICAL DATA:  75 year old male with persistent mid back pain. Previous cervical and lumbar surgery. EXAM: MRI THORACIC SPINE WITHOUT CONTRAST TECHNIQUE: Multiplanar, multisequence MR imaging of the thoracic spine was performed. No intravenous contrast was administered. COMPARISON:  Cervical spine MRI 01/14/2014. Cervical CT myelogram 01/28/2014. Chest CT 03/21/2021.  FINDINGS: Limited cervical spine imaging: ACDF since 2015. ACDF hardware appears to extend to the C5 level. Lower cervical degenerative disc osteophyte formation. Little upper cervical spinal canal detail on these images, but there is borderline to mild spinal stenosis at C5-C6 and C6-C7. Thoracic spine segmentation:  Normal on the comparison CT. Alignment: Straightening of thoracic kyphosis, with mildly exaggerated kyphosis at the thoracolumbar junction, is stable from the CT last month. No significant scoliosis. Subtle anterolisthesis of T1 on T2. No significant spondylolisthesis. Vertebrae: No marrow edema or evidence of acute osseous abnormality. Visualized bone marrow signal is within normal limits. Cord: No thoracic spinal cord signal abnormality despite some degenerative cord mass effect detailed below. The conus medullaris occurs below T12. Paraspinal and other soft tissues: Visible lung parenchyma appears stable since September, including a rounded greater than 2 cm lung nodule in the left costophrenic angle (series 21, image 33), please see that report. No pericardial or pleural effusion. Stable visible upper abdominal viscera. Thoracic paraspinal soft tissues remain within normal limits. Disc levels: T1-T2: Mild anterolisthesis. Mild to moderate facet and ligament flavum hypertrophy. Mild disc bulging. No spinal stenosis. Mild left and moderate to severe right T1 foraminal stenosis (series 18, image 6). T2-T3: Disc space loss. Circumferential disc osteophyte complex. Mild posterior element hypertrophy. Partially effaced ventral CSF space but no significant spinal stenosis. However, moderate to severe bilateral T2 foraminal stenosis (same image on right). T3-T4: Minor disc bulge. Mild facet and ligament flavum hypertrophy. No stenosis. T4-T5: Mild broad-based posterior disc bulging. Mild to moderate facet hypertrophy on the right. Mild right T4 foraminal stenosis. T5-T6: Small central disc protrusion  (series 21, image 21). Subtle ventral cord mass effect but no significant stenosis. T6-T7: Mild disc bulge and facet hypertrophy. No stenosis. T7-T8: Broad-based fairly small right paracentral disc extrusion (series 21, image 27 and series 18, image 9). Mild facet and ligament flavum hypertrophy. Mild left hemi cord mass effect but no significant spinal stenosis. There is mild to moderate bilateral T7 foraminal stenosis. T8-T9: Mild disc bulge. Moderate facet and ligament flavum hypertrophy. Mild to moderate bilateral T8 foraminal stenosis. T9-T10: Mild circumferential disc bulge. Moderate facet and ligament flavum hypertrophy. Mild to moderate left, mild right T9 foraminal stenosis. T10-T11: Mild circumferential disc bulge. Moderate facet and ligament flavum hypertrophy. Moderate to severe left and mild right T10 foraminal stenosis. T11-T12: Mild circumferential disc bulge with broad-based small right paracentral disc protrusion (series 21, image 37). Mild facet hypertrophy. Borderline to mild spinal stenosis with subtle ventral cord mass effect. Up to mild right T11 foraminal stenosis. T12-L1: Anterior disc osteophyte complex. No stenosis. IMPRESSION: 1. Thoracic spine degeneration with no acute osseous abnormality. 2. Widespread thoracic facet arthropathy. Intermittent small thoracic disc herniations. Up to mild spinal stenosis at T11-T12. Subtle spinal cord mass effect there and occasionally at other levels, but no thoracic spinal cord signal abnormality. Moderate or severe degenerative neural foraminal stenosis at the right T1, bilateral T2, bilateral T7, left T9, and Left T10 nerve levels. 3. Pulmonary nodule/mass in the left costophrenic angle as detailed on the Chest CT last month (please see that report). Electronically Signed  By: Genevie Ann M.D.   On: 05/11/2021 05:58   DG CHEST PORT 1 VIEW  Result Date: 05/16/2021 CLINICAL DATA:  Postop left bronchoscopy EXAM: PORTABLE CHEST 1 VIEW COMPARISON:   11/01/2016 chest radiograph. FINDINGS: Fiducial marker overlies the left lung base. Stable cardiomediastinal silhouette with normal heart size. No pneumothorax. No pleural effusion. Mild bibasilar atelectasis. No pulmonary edema. No acute consolidative airspace disease. IMPRESSION: Mild bibasilar atelectasis. No pneumothorax. Electronically Signed   By: Ilona Sorrel M.D.   On: 05/16/2021 10:45   XR Hand 2 View Left  Result Date: 05/05/2021 Severe CMC narrowing and subluxation was noted.  Narrowing of first, second and fourth MCP joints was noted.  Severe narrowing of all PIP and DIP joints was noted.  Ankylosis of fourth and fifth PIP joints was noted.  Increased narrowing of PIP and DIP joints was noted when compared to the x-rays of 2019.  No intercarpal or radiocarpal joint space narrowing was noted.  No erosive changes were noted. 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  XR Hand 2 View Right  Result Date: 05/05/2021 Severe CMC narrowing and subluxation with spurring was noted.  Second MCP narrowing was noted.  Severe narrowing of all PIP and DIP joints with subluxation of some of these joints was noted.  No erosive changes were noted.  No intercarpal or radiocarpal joint space narrowing was noted.  Increased narrowing of PIP and DIP joints was noted when compared to the x-rays of 2019. 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  DG C-ARM BRONCHOSCOPY  Result Date: 05/16/2021 C-ARM BRONCHOSCOPY: Fluoroscopy was utilized by the requesting physician.  No radiographic interpretation.     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   anterior cer disc and  fusion with plates C3-4 and Z1-2 at [facility]   Constipation    Enlarged prostate    GERD (gastroesophageal reflux disease)    Hemorrhoids    History of kidney stones    passed stones   History of lumbar laminectomy 05/12/2020   open L2-L5 at [facility]   Hx of colonic polyp 05/29/2018   05/2018 cecal ssp   Lung nodule 02/2021   Maxillary sinus polyp    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   Prolapsed internal hemorrhoids, grade 3 09/10/2006   Qualifier: Diagnosis of  By: Jonna Munro MD, Cornelius     Salivary gland enlargement    Sleep apnea 10 years ago   declined CPAP   Stroke ([place]) 15-20 years ago     Social History   Tobacco Use   Smoking status: Former    Packs/day: 0.25    Types: Cigarettes    Start date: 10/15/1965    Quit date: 10/16/1974    Years since quitting: 46.6   Smokeless tobacco: Never  Vaping Use   Vaping Use: Never used  Substance Use Topics   Alcohol use: Not Currently    Comment: rarely wine   Drug use: No    Mr.Rasmusson reports that he quit smoking about 46 years ago. His smoking use included cigarettes. He started smoking about 55 years ago. He smoked an average of .25 packs  per day. He has never used smokeless tobacco. He reports that he does not currently use alcohol. He reports that he does not use drugs.  Tobacco Cessation: Former smoker , quit 10/1974   Past surgical hx, Family hx, Social hx all reviewed.  Current Outpatient Medications on File Prior to Visit  Medication Sig   aspirin 81 MG EC tablet Take 81 mg by mouth every evening.   clotrimazole-betamethasone (LOTRISONE) cream Apply 1 application topically 2 (two) times daily.   docusate sodium (COLACE) 100 MG capsule Take 200 mg by mouth daily as needed for mild constipation.   finasteride (PROSCAR) 5 MG tablet Take 1 tablet (5 mg total) by mouth daily.   gabapentin (NEURONTIN) 300 MG capsule Take 300 mg by mouth 3 (three) times daily as needed (severe pain).   Ginger, Zingiber officinalis, (GINGER PO) Take  4 g by mouth daily.   Magnesium 400 MG CAPS Take 400 mg by mouth 2 (two) times daily. asporotate   melatonin 3 MG TABS tablet Take 3 mg by mouth at bedtime.   MISC NATURAL PRODUCTS PO Take 1 capsule by mouth daily. Nettle  Root Extract   Omega-3 Fatty Acids (FISH OIL) 1200 MG CAPS Take 1,200 mg by mouth daily.   omeprazole (PRILOSEC) 40 MG capsule Take 40 mg by mouth daily in the afternoon.   OVER THE COUNTER MEDICATION Apply 1 application topically 2 (two) times daily. Topical CBD oil Lower back and left leg   polyethylene glycol powder (GLYCOLAX/MIRALAX) 17 GM/SCOOP powder Take 0.5 Containers by mouth daily as needed for moderate constipation or mild constipation.   Probiotic Product (PROBIOTIC DAILY PO) Take 1 tablet by mouth 2 (two) times daily.   senna-docusate (SENOKOT-S) 8.6-50 MG tablet Take 2 tablets by mouth at bedtime as needed for mild constipation.   silodosin (RAPAFLO) 8 MG CAPS capsule Take 1 capsule (8 mg total) by mouth daily with breakfast.   SOOLANTRA 1 % CREA Apply 1 application topically daily as needed (Rosacea).   Tart Cherry 1200 MG CAPS Take 3,600 capsules by mouth daily.   tiZANidine (ZANAFLEX) 4 MG tablet Take 4 mg by mouth 3 (three) times daily as needed for severe pain.   TURMERIC PO Take 750 mg by mouth daily. curamed curcumin   Zinc Sulfate (ZINC 15 PO) Take 15 mg by mouth daily.   No current facility-administered medications on file prior to visit.     No Known Allergies  Review Of Systems:  Constitutional:   No  weight loss, night sweats,  Fevers, chills, fatigue, or  lassitude.  HEENT:   No headaches,  Difficulty swallowing,  Tooth/dental problems, or  Sore throat,                No sneezing, itching, ear ache, nasal congestion, post nasal drip,   CV:  No chest pain,  Orthopnea, PND, swelling in lower extremities, anasarca, dizziness, palpitations, syncope.   GI  No heartburn, indigestion, abdominal pain, nausea, vomiting, diarrhea, change in bowel  habits, loss of appetite, bloody stools.   Resp: + shortness of breath with exertion olessat rest.  No excess mucus, no productive cough,  No non-productive cough,  No coughing up of blood.  No change in color of mucus.  No wheezing.  No chest wall deformity  Skin: no rash or lesions.  GU: no dysuria, change in color of urine, no urgency or frequency.  No flank pain, no hematuria   MS:  No joint pain or swelling.  No decreased range of motion.  No back pain.  Psych:  No change in mood or affect. No depression or anxiety.  No memory loss.   Vital Signs BP 120/76 (BP Location: Right Arm, Patient Position: Sitting, Cuff Size: Normal)   Pulse 60   Temp 98.1 F (36.7 C) (Oral)   Ht 6\' 2"  (1.88 m)   Wt 183 lb 3.2 oz (83.1 kg)   SpO2 99%   BMI 23.52 kg/m    Physical Exam:  General- No distress,  A&Ox3, appropiate ENT: No sinus tenderness, TM clear, pale nasal mucosa, no oral exudate,no post nasal drip, no LAN Cardiac: S1, S2, regular rate and rhythm, no murmur Chest: No wheeze/ rales/ dullness; no accessory muscle use, no nasal flaring, no sternal retractions Abd.: Soft Non-tender, ND, BS +, Body mass index is 23.52 kg/m. Ext: No clubbing cyanosis, edema Neuro:  normal strength, MAE x 4, A&O x 3 Skin: No rashes, warm and dry Psych: normal mood and behavior, appropriately concerned.    Assessment/Plan Adenocarcinoma of the lung Former remote smoking history Plan Referral to radiation oncology ( Made today) for plan of care.  SBRT ( Fiducial placed )  Follow up in 3 months with Dr. Franne Grip or  as needed   I spent 35 minutes dedicated to the care of this patient on the date of this encounter to include pre-visit review of records, face-to-face time with the patient discussing conditions above, post visit ordering of testing, clinical documentation with the electronic health record, making appropriate referrals as documented, and communicating necessary information to the  patient's healthcare team.   Magdalen Spatz, NP 05/22/2021  10:35 AM

## 2021-05-22 NOTE — Patient Instructions (Addendum)
It is good to see you today. We will refer you to radiation oncology Follow up in 3 months with Dr. Erin Fulling. Continue your reflux medication  We will give you a reflux diet.  If you need [place] sooner just call or send a My Chart message.  Please contact office for sooner follow up if symptoms do not improve or worsen or seek emergency care

## 2021-05-23 ENCOUNTER — Ambulatory Visit
Admission: RE | Admit: 2021-05-23 | Discharge: 2021-05-23 | Disposition: A | Discharge status: Home / Self Care | Payer: Medicare PPO | Source: Ambulatory Visit | Attending: Radiation Oncology | Admitting: Radiation Oncology

## 2021-05-23 ENCOUNTER — Other Ambulatory Visit: Payer: Self-pay

## 2021-05-23 ENCOUNTER — Encounter: Payer: Self-pay | Admitting: Radiation Oncology

## 2021-05-23 VITALS — Ht 74.0 in | Wt 183.0 lb

## 2021-05-23 DIAGNOSIS — C3432 Malignant neoplasm of lower lobe, left bronchus or lung: Secondary | ICD-10-CM

## 2021-05-25 NOTE — Progress Notes (Signed)
Radiation Oncology         [phone] ________________________________  Name: Russell [name] MRN: [id number]  Date: 05/23/2021  DOB: Aug 18, 1945  QQ:[id number], Marvis Repress, FNP  Icard, Octavio Graves, DO     REFERRING PHYSICIAN: Garner Nash, DO   DIAGNOSIS: The primary encounter diagnosis was Malignant neoplasm of lower lobe of left lung ([place]). A diagnosis of 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) was also pertinent to this visit.   HISTORY OF PRESENT ILLNESS::Russell [name] is a 75 y.o. male who is seen for an initial consultation visit regarding the patient's diagnosis of non-small cell lung cancer.  The patient has been followed with imaging which has demonstrated a potential lesion within the left lower lobe.  Previously this measured 2.4 cm.  Recent follow-up imaging included a CT scan of the chest which describes a 2.7 cm left lower lobe tumor, increased from previously.  The patient in terms of symptoms states that he is doing very well overall however.  He denies any worsening shortness of breath or other respiratory symptoms.  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  Patient does not appear to be a good candidate for surgical resection so I have been asked to see the patient for consideration of stereotactic body radiation treatment to this lesion.    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     PAST SURGICAL HISTORY: Past Surgical History:  Procedure Laterality Date   BRONCHIAL BIOPSY  05/16/2021   Procedure: BRONCHIAL BIOPSIES;  Surgeon: Garner Nash, DO;  Location: [facility];  Service: Pulmonary;;   BRONCHIAL BRUSHINGS  05/16/2021   Procedure: BRONCHIAL BRUSHINGS;  Surgeon: Garner Nash, DO;  Location: [place] [facility];  Service: Pulmonary;;   BRONCHIAL NEEDLE ASPIRATION BIOPSY  05/16/2021   Procedure: BRONCHIAL NEEDLE ASPIRATION BIOPSIES;  Surgeon: Garner Nash, DO;  Location: [place] [facility];  Service: Pulmonary;;   CERVICAL SPINE SURGERY  04/10/2020   C3-C4 with pin at [facility]   COLONOSCOPY     FIDUCIAL MARKER PLACEMENT  05/16/2021   Procedure: FIDUCIAL MARKER PLACEMENT;  Surgeon: Garner Nash, DO;  Location: [place] [facility];  Service: Pulmonary;;   HEMORRHOID BANDING     LUMBAR LAMINECTOMY  03/08/2010   L2-3, cauda equina syndrome   lumbar laminectomy  05/12/2020   [facility]   UPPER GI ENDOSCOPY     VIDEO BRONCHOSCOPY WITH ENDOBRONCHIAL NAVIGATION Left 05/16/2021   Procedure: VIDEO BRONCHOSCOPY WITH ENDOBRONCHIAL NAVIGATION;  Surgeon: Garner Nash, DO;  Location: [facility];  Service: Pulmonary;  Laterality: Left;  ION w/ fiducial placement   VIDEO BRONCHOSCOPY WITH RADIAL ENDOBRONCHIAL ULTRASOUND  05/16/2021   Procedure: VIDEO BRONCHOSCOPY WITH RADIAL ENDOBRONCHIAL ULTRASOUND;  Surgeon: Garner Nash, DO;  Location: [name];  Service: Pulmonary;;   wisdom teeth ext       FAMILY HISTORY: family history includes Aortic aneurysm in his mother, sister, sister, and sister;  CVA in his sister; Heart attack in his father.   SOCIAL HISTORY:  reports that he quit smoking about 46 years ago. His smoking use included cigarettes. He started smoking about 55 years ago. He smoked an average  of .25 packs per day. He has never used smokeless tobacco. He reports that he does not currently use alcohol. He reports that he does not use drugs.   ALLERGIES: Patient has no known allergies.   MEDICATIONS:  Current Outpatient Medications  Medication Sig Dispense Refill   aspirin 81 MG EC tablet Take 81 mg by mouth every evening.     clotrimazole-betamethasone (LOTRISONE) cream Apply 1 application topically 2 (two) times daily. 30 g 0   docusate sodium (COLACE) 100 MG capsule Take 200 mg by mouth daily as needed for mild constipation.     finasteride (PROSCAR) 5 MG tablet Take 1 tablet (5 mg total) by mouth daily. 90 tablet 3   gabapentin (NEURONTIN) 300 MG capsule Take 300 mg by mouth 3 (three) times daily as needed (severe pain).     Ginger, Zingiber officinalis, (GINGER PO) Take 4 g by mouth daily.     Magnesium 400 MG CAPS Take 400 mg by mouth 2 (two) times daily. asporotate     melatonin 3 MG TABS tablet Take 3 mg by mouth at bedtime.     MISC NATURAL PRODUCTS PO Take 1 capsule by mouth daily. Nettle  Root Extract     Omega-3 Fatty Acids (FISH OIL) 1200 MG CAPS Take 1,200 mg by mouth daily.     omeprazole (PRILOSEC) 40 MG capsule Take 40 mg by mouth daily in the afternoon.     OVER THE COUNTER MEDICATION Apply 1 application topically 2 (two) times daily. Topical CBD oil Lower back and left leg     polyethylene glycol powder (GLYCOLAX/MIRALAX) 17 GM/SCOOP powder Take 0.5 Containers by mouth daily as needed for moderate constipation or mild constipation.     Probiotic Product (PROBIOTIC DAILY PO) Take 1 tablet by mouth 2 (two) times daily.     senna-docusate (SENOKOT-S) 8.6-50 MG tablet Take 2 tablets by mouth at bedtime as needed for mild constipation.     silodosin (RAPAFLO) 8 MG CAPS capsule Take 1 capsule (8 mg total) by mouth daily with breakfast. 30 capsule 11   SOOLANTRA 1 % CREA Apply 1 application topically daily as needed (Rosacea).  5   Tart Cherry 1200 MG CAPS Take 3,600  capsules by mouth daily.     tiZANidine (ZANAFLEX) 4 MG tablet Take 4 mg by mouth 3 (three) times daily as needed for severe pain.     TURMERIC PO Take 750 mg by mouth daily. curamed curcumin     Zinc Sulfate (ZINC 15 PO) Take 15 mg by mouth daily.     No current facility-administered medications for this encounter.     REVIEW OF SYSTEMS:  A 15 point review of systems is documented in the electronic medical record. This was obtained by the nursing staff. However, I reviewed this with the patient to discuss relevant findings and make appropriate changes.  Pertinent items are noted in HPI.    PHYSICAL EXAM:  height is 6\' 2"  (1.88 m) and weight is 183 lb (83 kg).   ECOG = 0  0 - Asymptomatic (Fully active, able to carry on all predisease activities without restriction)  1 - Symptomatic but completely ambulatory (Restricted in physically strenuous activity but ambulatory and able to carry out work of a light  or sedentary nature. For example, light housework, office work)  2 - Symptomatic, <50% in bed during the day (Ambulatory and capable of all self care but unable to carry out any work activities. Up and about more than 50% of waking hours)  3 - Symptomatic, >50% in bed, but not bedbound (Capable of only limited self-care, confined to bed or chair 50% or more of waking hours)  4 - Bedbound (Completely disabled. Cannot carry on any self-care. Totally confined to bed or chair)  5 - Death   Eustace Pen MM, Creech RH, Tormey DC, et al. [phone]). "Toxicity and response criteria of the [facility]". [place] Oncol. 5 (6): 649-55  Alert, no distress   LABORATORY DATA:  Lab Results  Component Value Date   WBC 5.0 05/16/2021   HGB 12.7 (L) 05/16/2021   HCT 40.5 05/16/2021   MCV 81.3 05/16/2021   PLT 201 05/16/2021   Lab Results  Component Value Date   NA 137 05/16/2021   K 3.8 05/16/2021   CL 105 05/16/2021   CO2 25 05/16/2021   Lab Results  Component Value  Date   ALT 16 09/28/2019   AST 21 09/28/2019   ALKPHOS 44 09/28/2019   BILITOT 0.6 09/28/2019      RADIOGRAPHY: MR THORACIC SPINE WO CONTRAST  Result Date: 05/11/2021 CLINICAL DATA:  75 year old male with persistent mid back pain. Previous cervical and lumbar surgery. EXAM: MRI THORACIC SPINE WITHOUT CONTRAST TECHNIQUE: Multiplanar, multisequence MR imaging of the thoracic spine was performed. No intravenous contrast was administered. COMPARISON:  Cervical spine MRI 01/14/2014. Cervical CT myelogram 01/28/2014. Chest CT 03/21/2021. FINDINGS: Limited cervical spine imaging: ACDF since 2015. ACDF hardware appears to extend to the C5 level. Lower cervical degenerative disc osteophyte formation. Little upper cervical spinal canal detail on these images, but there is borderline to mild spinal stenosis at C5-C6 and C6-C7. Thoracic spine segmentation:  Normal on the comparison CT. Alignment: Straightening of thoracic kyphosis, with mildly exaggerated kyphosis at the thoracolumbar junction, is stable from the CT last month. No significant scoliosis. Subtle anterolisthesis of T1 on T2. No significant spondylolisthesis. Vertebrae: No marrow edema or evidence of acute osseous abnormality. Visualized bone marrow signal is within normal limits. Cord: No thoracic spinal cord signal abnormality despite some degenerative cord mass effect detailed below. The conus medullaris occurs below T12. Paraspinal and other soft tissues: Visible lung parenchyma appears stable since September, including a rounded greater than 2 cm lung nodule in the left costophrenic angle (series 21, image 33), please see that report. No pericardial or pleural effusion. Stable visible upper abdominal viscera. Thoracic paraspinal soft tissues remain within normal limits. Disc levels: T1-T2: Mild anterolisthesis. Mild to moderate facet and ligament flavum hypertrophy. Mild disc bulging. No spinal stenosis. Mild left and moderate to severe right T1  foraminal stenosis (series 18, image 6). T2-T3: Disc space loss. Circumferential disc osteophyte complex. Mild posterior element hypertrophy. Partially effaced ventral CSF space but no significant spinal stenosis. However, moderate to severe bilateral T2 foraminal stenosis (same image on right). T3-T4: Minor disc bulge. Mild facet and ligament flavum hypertrophy. No stenosis. T4-T5: Mild broad-based posterior disc bulging. Mild to moderate facet hypertrophy on the right. Mild right T4 foraminal stenosis. T5-T6: Small central disc protrusion (series 21, image 21). Subtle ventral cord mass effect but no significant stenosis. T6-T7: Mild disc bulge and facet hypertrophy. No stenosis. T7-T8: Broad-based fairly small right paracentral disc extrusion (series 21, image 27 and series 18, image 9).  Mild facet and ligament flavum hypertrophy. Mild left hemi cord mass effect but no significant spinal stenosis. There is mild to moderate bilateral T7 foraminal stenosis. T8-T9: Mild disc bulge. Moderate facet and ligament flavum hypertrophy. Mild to moderate bilateral T8 foraminal stenosis. T9-T10: Mild circumferential disc bulge. Moderate facet and ligament flavum hypertrophy. Mild to moderate left, mild right T9 foraminal stenosis. T10-T11: Mild circumferential disc bulge. Moderate facet and ligament flavum hypertrophy. Moderate to severe left and mild right T10 foraminal stenosis. T11-T12: Mild circumferential disc bulge with broad-based small right paracentral disc protrusion (series 21, image 37). Mild facet hypertrophy. Borderline to mild spinal stenosis with subtle ventral cord mass effect. Up to mild right T11 foraminal stenosis. T12-L1: Anterior disc osteophyte complex. No stenosis. IMPRESSION: 1. Thoracic spine degeneration with no acute osseous abnormality. 2. Widespread thoracic facet arthropathy. Intermittent small thoracic disc herniations. Up to mild spinal stenosis at T11-T12. Subtle spinal cord mass effect there  and occasionally at other levels, but no thoracic spinal cord signal abnormality. Moderate or severe degenerative neural foraminal stenosis at the right T1, bilateral T2, bilateral T7, left T9, and Left T10 nerve levels. 3. Pulmonary nodule/mass in the left costophrenic angle as detailed on the Chest CT last month (please see that report). Electronically Signed   By: Genevie Ann M.D.   On: 05/11/2021 05:58   DG CHEST PORT 1 VIEW  Result Date: 05/16/2021 CLINICAL DATA:  Postop left bronchoscopy EXAM: PORTABLE CHEST 1 VIEW COMPARISON:  11/01/2016 chest radiograph. FINDINGS: Fiducial marker overlies the left lung base. Stable cardiomediastinal silhouette with normal heart size. No pneumothorax. No pleural effusion. Mild bibasilar atelectasis. No pulmonary edema. No acute consolidative airspace disease. IMPRESSION: Mild bibasilar atelectasis. No pneumothorax. Electronically Signed   By: Ilona Sorrel M.D.   On: 05/16/2021 10:45   XR Hand 2 View Left  Result Date: 05/05/2021 Severe CMC narrowing and subluxation was noted.  Narrowing of first, second and fourth MCP joints was noted.  Severe narrowing of all PIP and DIP joints was noted.  Ankylosis of fourth and fifth PIP joints was noted.  Increased narrowing of PIP and DIP joints was noted when compared to the x-rays of 2019.  No intercarpal or radiocarpal joint space narrowing was noted.  No erosive changes were noted. 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  XR Hand 2 View Right  Result Date: 05/05/2021 Severe CMC narrowing and subluxation with spurring was noted.  Second MCP narrowing was noted.  Severe narrowing of all PIP and DIP joints with subluxation of some of these joints was noted.  No erosive changes were noted.  No intercarpal or radiocarpal joint space narrowing was noted.  Increased narrowing of PIP and DIP joints was noted when compared to the x-rays of 2019. Impression: These findings are consistent with  severe osteoarthritis of the hand.  DG C-ARM BRONCHOSCOPY  Result Date: 05/16/2021 C-ARM BRONCHOSCOPY: Fluoroscopy was utilized by the requesting physician.  No radiographic interpretation.       IMPRESSION/ PLAN:  The patient currently appears to be a good candidate for stereotactic body radiation treatment to a adenocarcinoma within the left lower lobe.  This represents a stage I non-small cell lung cancer.  I did discuss proceeding with a PET scan to complete his staging.  No indication on current imaging any additional disease is present.  We therefore did discuss a potential course of stereotactic body radiation treatment and what this would entail.  We discussed the rationale of  treatment as well as possible side effects and risks.  All of his questions were answered.  Based on the size and location of the tumor, I would tentatively anticipate a 5 fraction course of stereotactic body radiation treatment.  All of his questions were answered and he is eager to proceed with his overall treatment plan.  Staging-stage I non-small cell lung cancer of the left lower lobe  Given current concerns for patient exposure during the COVID-19 pandemic, this encounter was conducted via telephone.  The patient has given verbal consent for this type of encounter. The time spent during this encounter was 45 minutes and more than 50% of that time was spent in the coordination of the patient's care. The attendants for this meeting included Dr. Lisbeth Renshaw, and the patient.  During the encounter Dr. Lisbeth Renshaw was located at [facility].  The patient  was located at home.        ________________________________   Jodelle Gross, MD, PhD   **Disclaimer: This note was dictated with voice recognition software. Similar sounding words can inadvertently be transcribed and this note may contain transcription errors which may not have been corrected upon publication of  note.**

## 2021-05-29 NOTE — Telephone Encounter (Signed)
Russell [name] please advise on the following My Chart message:   I spoke to the radiologist the next day after our appointment last Monday and he wanted a PET scan before beginning radiation. I didn't get his name so can't contact him directly about the lack of response by [name] to schedule one for me.   Could you pass this information on; that one still hasn't been scheduled, to the doctor you referred me to?  I can see where the PET ordered has been placed but am unable to see what is holding scheduling up.   Thank you

## 2021-06-01 ENCOUNTER — Other Ambulatory Visit: Payer: Self-pay

## 2021-06-01 ENCOUNTER — Ambulatory Visit (HOSPITAL_COMMUNITY)
Admission: RE | Admit: 2021-06-01 | Discharge: 2021-06-01 | Disposition: A | Discharge status: Home / Self Care | Payer: Medicare PPO | Source: Ambulatory Visit | Attending: Radiation Oncology | Admitting: Radiation Oncology

## 2021-06-01 DIAGNOSIS — C3432 Malignant neoplasm of lower lobe, left bronchus or lung: Secondary | ICD-10-CM | POA: Diagnosis present

## 2021-06-01 MED ORDER — FLUDEOXYGLUCOSE F - 18 (FDG) INJECTION
9.4720 | Freq: Once | INTRAVENOUS | Status: AC | PRN
  Administered 2021-06-01: 12:00:00 9.472 via INTRAVENOUS

## 2021-06-05 ENCOUNTER — Telehealth: Payer: Self-pay | Admitting: Radiation Oncology

## 2021-06-05 DIAGNOSIS — C3432 Malignant neoplasm of lower lobe, left bronchus or lung: Secondary | ICD-10-CM

## 2021-06-05 NOTE — Telephone Encounter (Signed)
I called the patient to let him know the results of his PET and Dr. Ida Rogue input.  In summary this is a patient who has chronic interstitial lung disease he sees Dr. Raeanne Barry at [facility] [place] and imaging recently showed an increase in size of a mass in the left lower lobe measuring 2.7 cm, previously 2.4 cm in February 2022.  He underwent bronchoscopy on 05/16/2021 and final pathology showed atypical cells in the brushings showed atypia, but malignant cells consistent with adenocarcinoma were noted in the fine-needle aspirate.  He was referred to Dr. Lisbeth Renshaw as he was not interested in surgical resection however PET scan was recommended and performed on 06/01/21.  The blood pool was an SUV of 1.9, the lesion was also measuring with an SUV of 1.9 and was not as easy to distinguish as we had hoped.  While the patient is interested in being aggressive of his treatment for this, he is also trying to take into consideration his quality of life as he has had some complications in the past from other surgeries and changes in his stamina and tolerance for activity given his interstitial lung disease.  While SBRT is not completely off the table at this point, Dr. Lisbeth Renshaw would like this to have him meet with cardiothoracic surgery to see how they might consider approaching this if he is a surgical candidate.  We discussed the long-term risks of progressive interstitial lung disease from SBRT as well as risks associated with volume loss.  While this decision is not an easy one, we appreciate the input of the other team members.  He may need pulmonary function testing prior to making this decision.

## 2021-06-06 LAB — CULTURE, FUNGUS WITHOUT SMEAR

## 2021-06-12 ENCOUNTER — Encounter: Payer: Self-pay | Admitting: Urology

## 2021-06-12 ENCOUNTER — Other Ambulatory Visit: Payer: Self-pay | Admitting: Thoracic Surgery (Cardiothoracic Vascular Surgery)

## 2021-06-12 ENCOUNTER — Other Ambulatory Visit: Payer: Self-pay

## 2021-06-12 ENCOUNTER — Ambulatory Visit: Payer: Medicare PPO | Admitting: Urology

## 2021-06-12 VITALS — BP 114/70 | HR 79

## 2021-06-12 DIAGNOSIS — N138 Other obstructive and reflux uropathy: Secondary | ICD-10-CM

## 2021-06-12 DIAGNOSIS — R972 Elevated prostate specific antigen [PSA]: Secondary | ICD-10-CM

## 2021-06-12 DIAGNOSIS — R339 Retention of urine, unspecified: Secondary | ICD-10-CM | POA: Diagnosis not present

## 2021-06-12 DIAGNOSIS — N401 Enlarged prostate with lower urinary tract symptoms: Secondary | ICD-10-CM

## 2021-06-12 LAB — URINALYSIS, ROUTINE W REFLEX MICROSCOPIC
Bilirubin, UA: NEGATIVE
Glucose, UA: NEGATIVE
Ketones, UA: NEGATIVE
Leukocytes,UA: NEGATIVE
Nitrite, UA: NEGATIVE
Protein,UA: NEGATIVE
RBC, UA: NEGATIVE
Specific Gravity, UA: 1.02 (ref 1.005–1.030)
Urobilinogen, Ur: 1 mg/dL (ref 0.2–1.0)
pH, UA: 6 (ref 5.0–7.5)

## 2021-06-12 LAB — SARS CORONAVIRUS 2 (TAT 6-24 HRS): SARS Coronavirus 2: NEGATIVE

## 2021-06-12 LAB — BLADDER SCAN AMB NON-IMAGING: Scan Result: 113

## 2021-06-12 MED ORDER — SILODOSIN 8 MG PO CAPS: 8.0000 mg | ORAL_CAPSULE | Freq: Every day | ORAL | 11 refills | Status: DC

## 2021-06-12 MED ORDER — FINASTERIDE 5 MG PO TABS: 5.0000 mg | ORAL_TABLET | Freq: Every day | ORAL | 3 refills | Status: DC

## 2021-06-12 NOTE — Progress Notes (Signed)
Urological Symptom Review  Patient is experiencing the following symptoms: Stream starts and stops Weak stream Erection problems (male only)   Review of Systems  Gastrointestinal (upper)  : Negative for upper GI symptoms  Gastrointestinal (lower) : Negative for lower GI symptoms  Constitutional : Negative for symptoms  Skin: Negative for skin symptoms  Eyes: Negative for eye symptoms  Ear/Nose/Throat : Negative for Ear/Nose/Throat symptoms  Hematologic/Lymphatic: Negative for Hematologic/Lymphatic symptoms  Cardiovascular : Leg swelling  Respiratory : Shortness of breath  Endocrine: Negative for endocrine symptoms  Musculoskeletal: Back pain Joint pain  Neurological: Negative for neurological symptoms  Psychologic: Negative for psychiatric symptoms

## 2021-06-12 NOTE — Progress Notes (Signed)
post void residual=113

## 2021-06-12 NOTE — Patient Instructions (Signed)

## 2021-06-12 NOTE — Progress Notes (Signed)
06/12/2021 11:31 AM   [name] March 11, 1946 [id number]  Referring provider: Marrian Salvage, [address] [place],  [place] [address]  Followup BPH   HPI: Mr Bur is a 75yo here for followup for BPH and new hematospermia. He has stable LUTS on rapaflo 8mg  daily. PVR 113cc. He had hematospermia last week without pain with ejaculation. No dysuria or gross hematuria. No hx of prostate infections. He is currently on finasteride. He cannot get a firm erection without his ErectAid. 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   anterior cer disc and fusion with plates C3-4 and O8-4 at [facility]   Constipation    Enlarged prostate    GERD (gastroesophageal reflux disease)    Hemorrhoids    History of kidney stones    passed stones   History of lumbar laminectomy 05/12/2020   open L2-L5 at [facility]   Hx of colonic polyp 05/29/2018   05/2018 cecal ssp   Lung nodule 02/2021   Maxillary sinus polyp    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   Prolapsed internal hemorrhoids, grade 3 09/10/2006   Qualifier: Diagnosis of  By: Jonna Munro MD, Cornelius     Salivary gland enlargement    Sleep apnea 10 years ago   declined CPAP   Stroke ([place]) 15-20 years ago    Surgical History: Past Surgical History:  Procedure Laterality Date   BRONCHIAL BIOPSY  05/16/2021   Procedure: BRONCHIAL BIOPSIES;  Surgeon: Garner Nash, DO;  Location: [place] [facility];  Service: Pulmonary;;   BRONCHIAL BRUSHINGS  05/16/2021   Procedure: BRONCHIAL BRUSHINGS;  Surgeon: Garner Nash, DO;  Location: [place];  Service: Pulmonary;;   BRONCHIAL NEEDLE ASPIRATION BIOPSY  05/16/2021   Procedure: BRONCHIAL NEEDLE  ASPIRATION BIOPSIES;  Surgeon: Garner Nash, DO;  Location: [place] [facility];  Service: Pulmonary;;   CERVICAL SPINE SURGERY  04/10/2020   C3-C4 with pin at [facility]   COLONOSCOPY     FIDUCIAL MARKER PLACEMENT  05/16/2021   Procedure: FIDUCIAL MARKER PLACEMENT;  Surgeon: Garner Nash, DO;  Location: [place] [facility];  Service: Pulmonary;;   HEMORRHOID BANDING     LUMBAR LAMINECTOMY  03/08/2010   L2-3, cauda equina syndrome   lumbar laminectomy  05/12/2020   [facility]   UPPER GI ENDOSCOPY     VIDEO BRONCHOSCOPY WITH ENDOBRONCHIAL NAVIGATION Left 05/16/2021   Procedure: VIDEO BRONCHOSCOPY WITH ENDOBRONCHIAL NAVIGATION;  Surgeon: Garner Nash, DO;  Location: [place];  Service: Pulmonary;  Laterality: Left;  ION w/ fiducial placement   VIDEO BRONCHOSCOPY WITH RADIAL ENDOBRONCHIAL ULTRASOUND  05/16/2021   Procedure: VIDEO BRONCHOSCOPY WITH RADIAL ENDOBRONCHIAL ULTRASOUND;  Surgeon: Garner Nash, DO;  Location: [place] [facility];  Service: Pulmonary;;   wisdom teeth ext      Home Medications:  Allergies as of 06/12/2021   No Known Allergies      Medication List        Accurate as of June 12, 2021 11:31 AM. If you have any questions, ask your nurse or doctor.          STOP taking these medications    finasteride 5 MG tablet  Commonly known as: PROSCAR Stopped by: Nicolette Bang, MD   tiZANidine 4 MG tablet Commonly known as: ZANAFLEX Stopped by: Nicolette Bang, MD       TAKE these medications    aspirin 81 MG EC tablet Take 81 mg by mouth every evening.   clotrimazole-betamethasone cream Commonly known as: Lotrisone Apply 1 application topically 2 (two) times daily.   docusate sodium 100 MG capsule Commonly known as: COLACE Take 200 mg by mouth daily as needed for mild constipation.   Fish Oil 1200 MG Caps Take 1,200 mg by mouth daily.   gabapentin 300 MG capsule Commonly known as: NEURONTIN Take 300 mg by mouth 3 (three) times daily as needed (severe  pain).   GINGER PO Take 4 g by mouth daily.   Magnesium 400 MG Caps Take 400 mg by mouth 2 (two) times daily. asporotate   melatonin 3 MG Tabs tablet Take 3 mg by mouth at bedtime.   MISC NATURAL PRODUCTS PO Take 1 capsule by mouth daily. Nettle  Root Extract   omeprazole 40 MG capsule Commonly known as: PRILOSEC Take 40 mg by mouth daily in the afternoon.   OVER THE COUNTER MEDICATION Apply 1 application topically 2 (two) times daily. Topical CBD oil Lower back and left leg   polyethylene glycol powder 17 GM/SCOOP powder Commonly known as: GLYCOLAX/MIRALAX Take 0.5 Containers by mouth daily as needed for moderate constipation or mild constipation.   PROBIOTIC DAILY PO Take 1 tablet by mouth 2 (two) times daily.   senna-docusate 8.6-50 MG tablet Commonly known as: Senokot-S Take 2 tablets by mouth at bedtime as needed for mild constipation.   silodosin 8 MG Caps capsule Commonly known as: RAPAFLO Take 1 capsule (8 mg total) by mouth daily with breakfast.   Soolantra 1 % Crea Generic drug: Ivermectin Apply 1 application topically daily as needed (Rosacea).   Tart Cherry 1200 MG Caps Take 3,600 capsules by mouth daily.   TURMERIC PO Take 750 mg by mouth daily. curamed curcumin   ZINC 15 PO Take 15 mg by mouth daily.        Allergies: No Known Allergies  Family History: Family History  Problem Relation Age of Onset   Aortic aneurysm Mother    Heart attack Father    Aortic aneurysm Sister    CVA Sister    Aortic aneurysm Sister    Aortic aneurysm Sister    Colon cancer Neg Hx    Pancreatic cancer Neg Hx    Rectal cancer Neg Hx    Stomach cancer Neg Hx     Social History:  reports that he quit smoking about 46 years ago. His smoking use included cigarettes. He started smoking about 55 years ago. He smoked an average of .25 packs per day. He has never used smokeless tobacco. He reports that he does not currently use alcohol. He reports that he does not  use drugs.  ROS: All other review of systems were reviewed and are negative except what is noted above in HPI  Physical Exam: BP 114/70   Pulse 79   Constitutional:  Alert and oriented, No acute distress. HEENT: [place] AT, moist mucus membranes.  Trachea midline, no masses. Cardiovascular: No clubbing, cyanosis, or edema. Respiratory: Normal respiratory effort, no increased work of breathing. GI: Abdomen is soft, nontender, nondistended, no abdominal masses GU: No CVA tenderness.  Lymph: No cervical or inguinal lymphadenopathy. Skin: No rashes, bruises or suspicious lesions. Neurologic: Grossly intact, no focal deficits, moving  all 4 extremities. Psychiatric: Normal mood and affect.  Laboratory Data: Lab Results  Component Value Date   WBC 5.0 05/16/2021   HGB 12.7 (L) 05/16/2021   HCT 40.5 05/16/2021   MCV 81.3 05/16/2021   PLT 201 05/16/2021    Lab Results  Component Value Date   CREATININE 0.68 05/16/2021    Lab Results  Component Value Date   PSA 1.95 09/28/2019   PSA 1.10 02/10/2009    Lab Results  Component Value Date   TESTOSTERONE 467.03 09/12/2006    Lab Results  Component Value Date   HGBA1C 5.6 09/29/2019    Urinalysis    Component Value Date/Time   COLORURINE YELLOW 03/07/2010 2315   APPEARANCEUR Clear 05/08/2021 1658   LABSPEC 1.021 03/07/2010 2315   PHURINE 6.0 03/07/2010 2315   GLUCOSEU Negative 05/08/2021 1658   HGBUR NEGATIVE 03/07/2010 2315   BILIRUBINUR Negative 05/08/2021 1658   KETONESUR NEGATIVE 03/07/2010 2315   PROTEINUR Negative 05/08/2021 1658   PROTEINUR NEGATIVE 03/07/2010 2315   UROBILINOGEN 1.0 03/20/2018 1406   UROBILINOGEN 0.2 03/07/2010 2315   NITRITE Negative 05/08/2021 1658   NITRITE NEGATIVE 03/07/2010 2315   LEUKOCYTESUR Negative 05/08/2021 1658    Lab Results  Component Value Date   LABMICR Comment 05/08/2021   WBCUA None seen 03/08/2021   LABEPIT 0-10 03/08/2021   BACTERIA None seen 03/08/2021     Pertinent Imaging:  No results found for this or any previous visit.  No results found for this or any previous visit.  No results found for this or any previous visit.  No results found for this or any previous visit.  No results found for this or any previous visit.  No results found for this or any previous visit.  No results found for this or any previous visit.  No results found for this or any previous visit.   Assessment & Plan:    1. Elevated PSA -RTC 3 months with PSA  2. Benign prostatic hyperplasia with urinary obstruction -Continue rapaflo 8mg  and finasteride 5mg  - BLADDER SCAN AMB NON-IMAGING - Urinalysis, Routine w reflex microscopic  3. Urinary retention -Continue rapaflo 8mg  and finasteride 5mg   4. Hematospermia -We discussed the benign nature of hematospermia and we will proceed with observation since the patient is not having an associated symptoms   No follow-ups on file.  Nicolette Bang, MD  [facility] [place]

## 2021-06-14 ENCOUNTER — Inpatient Hospital Stay (HOSPITAL_COMMUNITY): Admission: RE | Admit: 2021-06-14 | Payer: Medicare PPO | Source: Ambulatory Visit

## 2021-06-15 LAB — FUNGUS CULTURE WITH STAIN

## 2021-06-15 LAB — FUNGAL ORGANISM REFLEX

## 2021-06-15 LAB — FUNGUS CULTURE RESULT

## 2021-06-17 ENCOUNTER — Other Ambulatory Visit: Payer: Self-pay

## 2021-06-17 ENCOUNTER — Ambulatory Visit
Admission: EM | Admit: 2021-06-17 | Discharge: 2021-06-17 | Disposition: A | Discharge status: Home / Self Care | Payer: Medicare PPO | Attending: Physician Assistant | Admitting: Physician Assistant

## 2021-06-17 DIAGNOSIS — S61210A Laceration without foreign body of right index finger without damage to nail, initial encounter: Secondary | ICD-10-CM

## 2021-06-17 MED ORDER — TETANUS-DIPHTH-ACELL PERTUSSIS 5-2.5-18.5 LF-MCG/0.5 IM SUSY
0.5000 mL | PREFILLED_SYRINGE | Freq: Once | INTRAMUSCULAR | Status: DC
Start: 2021-06-17 — End: 2021-06-17

## 2021-06-17 NOTE — ED Notes (Addendum)
Patient cut left index finger with a knife.  Points to laceration across knuckle of finger.  Wife applied butterfly bandage to this wound and then a bandaid.  Patient reports wound was washed thoroughly prior to application of bandage at home.  Bleeding is controlled.  Patient has good sensation, brisk cap refill, and able to move finger

## 2021-06-17 NOTE — ED Triage Notes (Signed)
Patient states he was sharpening a knife for cooking and sliced his finger.   He thinks he may need sutures.

## 2021-06-17 NOTE — Discharge Instructions (Addendum)
Keep steri strips in place for 5-7 days.  Keep clean and dry  For Tdap follow up in [facility] clinic tomorrow, at [facility], or call Primary Care Physician on Monday.

## 2021-06-17 NOTE — ED Provider Notes (Signed)
[facility]    CSN: [id number] Arrival date & time: 06/17/21  1505      History   Chief Complaint No chief complaint on file.   HPI Russell [name] is a 75 y.o. male.   Pt presents with a laceration to his right index that occurred earlier today while chopping onions.  He cleaned the wound with soap and water and rubbing alcohol.  His wife applied steri strips.  Bleeding is controlled.  Last tdap in 2009.     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   anterior cer disc and fusion with plates C3-4 and X3-8 at [facility]   Constipation    Enlarged prostate    GERD (gastroesophageal reflux disease)    Hemorrhoids    History of kidney stones    passed stones   History of lumbar laminectomy 05/12/2020   open L2-L5 at [facility]   Hx of colonic polyp 05/29/2018   05/2018 cecal ssp   Lung nodule 02/2021   Maxillary sinus polyp    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   Prolapsed internal hemorrhoids, grade 3 09/10/2006   Qualifier: Diagnosis of  By: Jonna Munro MD, Cornelius     Salivary gland enlargement    Sleep apnea 10 years ago   declined CPAP   Stroke ([place]) 15-20 years ago    Patient Active Problem List   Diagnosis Date Noted   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 ([place]) 05/23/2021   Former smoker 05/05/2021   Lung nodule 04/21/2021   Elevated PSA 02/16/2021   Penile edema 10/05/2020   Erectile dysfunction due to arterial insufficiency 05/11/2020   Gross hematuria 05/11/2020   Benign prostatic hyperplasia with urinary obstruction 04/11/2020   Urinary retention 04/11/2020   DDD (degenerative disc disease), cervical 06/11/2018   DDD (degenerative disc disease), lumbar 06/11/2018   History of kidney stones  06/11/2018   Cerebellar infarct ([place]) 06/11/2018   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[place]) 06/11/2018   Primary osteoarthritis of left knee 06/11/2018   Primary osteoarthritis of both feet 06/11/2018   Plantar fasciitis 06/11/2018   Hx of colonic polyp 05/29/2018   Left lumbar radiculopathy 18/29/9371   Diastolic dysfunction 69/67/8938   Psoriasis 10/15/2016   DEGENERATIVE JOINT DISEASE 02/07/2009   HEADACHE 02/07/2009   GERD 10/02/2007   HYPERTENSION, BENIGN ESSENTIAL 10/08/2006   TESTOSTERONE DEFICIENCY 09/10/2006   ERECTILE DYSFUNCTION 09/10/2006   Prolapsed internal hemorrhoids, grade 2 and 3 09/10/2006   PLANTAR FASCIITIS, BILATERAL 09/10/2006   SLEEP APNEA 09/10/2006   MALAISE AND FATIGUE 09/10/2006    Past Surgical History:  Procedure Laterality Date   BRONCHIAL BIOPSY  05/16/2021   Procedure: BRONCHIAL BIOPSIES;  Surgeon: Garner Nash, DO;  Location: [place] [facility];  Service: Pulmonary;;   BRONCHIAL BRUSHINGS  05/16/2021   Procedure: BRONCHIAL BRUSHINGS;  Surgeon: Garner Nash, DO;  Location: [place] [facility];  Service: Pulmonary;;   BRONCHIAL NEEDLE ASPIRATION BIOPSY  05/16/2021   Procedure: BRONCHIAL NEEDLE ASPIRATION BIOPSIES;  Surgeon: Garner Nash, DO;  Location: [place] [facility];  Service: Pulmonary;;   CERVICAL SPINE SURGERY  04/10/2020   C3-C4 with pin at [place]  05/16/2021   Procedure: FIDUCIAL MARKER PLACEMENT;  Surgeon: Garner Nash, DO;  Location: [place] [facility];  Service: Pulmonary;;   HEMORRHOID BANDING     LUMBAR LAMINECTOMY  03/08/2010   L2-3, cauda equina syndrome   lumbar laminectomy  05/12/2020   [facility]   UPPER GI ENDOSCOPY     VIDEO BRONCHOSCOPY WITH ENDOBRONCHIAL NAVIGATION Left 05/16/2021   Procedure: VIDEO BRONCHOSCOPY WITH ENDOBRONCHIAL NAVIGATION;  Surgeon: Garner Nash, DO;  Location: [place];  Service: Pulmonary;  Laterality: Left;  ION w/ fiducial placement   VIDEO BRONCHOSCOPY WITH RADIAL ENDOBRONCHIAL  ULTRASOUND  05/16/2021   Procedure: VIDEO BRONCHOSCOPY WITH RADIAL ENDOBRONCHIAL ULTRASOUND;  Surgeon: Garner Nash, DO;  Location: [place] [facility];  Service: Pulmonary;;   wisdom teeth ext         Home Medications    Prior to Admission medications   Medication Sig Start Date End Date Taking? Authorizing Provider  aspirin 81 MG EC tablet Take 81 mg by mouth every evening. 05/18/20   [provider]  clotrimazole-betamethasone (LOTRISONE) cream Apply 1 application topically 2 (two) times daily. 10/05/20   McKenzie, Candee Furbish, MD  docusate sodium (COLACE) 100 MG capsule Take 200 mg by mouth daily as needed for mild constipation.    [provider]  finasteride (PROSCAR) 5 MG tablet Take 1 tablet (5 mg total) by mouth daily. 06/12/21   McKenzie, Candee Furbish, MD  gabapentin (NEURONTIN) 300 MG capsule Take 300 mg by mouth 3 (three) times daily as needed (severe pain).    [provider]  Ginger, Zingiber officinalis, (GINGER PO) Take 4 g by mouth daily.    [provider]  Magnesium 400 MG CAPS Take 400 mg by mouth 2 (two) times daily. asporotate    [provider]  melatonin 3 MG TABS tablet Take 3 mg by mouth at bedtime.    [provider]  MISC NATURAL PRODUCTS PO Take 1 capsule by mouth daily. Nettle  Root Extract    [provider]  Omega-3 Fatty Acids (FISH OIL) 1200 MG CAPS Take 1,200 mg by mouth daily.    [provider]  omeprazole (PRILOSEC) 40 MG capsule Take 40 mg by mouth daily in the afternoon.    [provider]  OVER THE COUNTER MEDICATION Apply 1 application topically 2 (two) times daily. Topical CBD oil Lower back and left leg    [provider]  polyethylene glycol powder (GLYCOLAX/MIRALAX) 17 GM/SCOOP powder Take 0.5 Containers by mouth daily as needed for moderate constipation or mild constipation.    [provider]  Probiotic Product (PROBIOTIC DAILY PO) Take 1 tablet by mouth 2  (two) times daily.    [provider]  senna-docusate (SENOKOT-S) 8.6-50 MG tablet Take 2 tablets by mouth at bedtime as needed for mild constipation.    [provider]  silodosin (RAPAFLO) 8 MG CAPS capsule Take 1 capsule (8 mg total) by mouth daily with breakfast. 06/12/21   McKenzie, Candee Furbish, MD  SOOLANTRA 1 % CREA Apply 1 application topically daily as needed (Rosacea). 02/06/18   [provider]  Tart Cherry 1200 MG CAPS Take 3,600 capsules by mouth daily.    [provider]  TURMERIC PO Take 750 mg by mouth daily. curamed curcumin    [provider]  Zinc Sulfate (ZINC 15 PO) Take 15 mg by mouth daily.    [provider]    Family History Family History  Problem Relation Age of Onset  Aortic aneurysm Mother    Heart attack Father    Aortic aneurysm Sister    CVA Sister    Aortic aneurysm Sister    Aortic aneurysm Sister    Colon cancer Neg Hx    Pancreatic cancer Neg Hx    Rectal cancer Neg Hx    Stomach cancer Neg Hx     Social History Social History   Tobacco Use   Smoking status: Former    Packs/day: 0.25    Types: Cigarettes    Start date: 10/15/1965    Quit date: 10/16/1974    Years since quitting: 46.7   Smokeless tobacco: Never  Vaping Use   Vaping Use: Never used  Substance Use Topics   Alcohol use: Not Currently    Comment: rarely wine   Drug use: No     Allergies   Patient has no known allergies.   Review of Systems Review of Systems  Constitutional:  Negative for chills and fever.  HENT:  Negative for ear pain and sore throat.   Eyes:  Negative for pain and visual disturbance.  Respiratory:  Negative for cough and shortness of breath.   Cardiovascular:  Negative for chest pain and palpitations.  Gastrointestinal:  Negative for abdominal pain and vomiting.  Genitourinary:  Negative for dysuria and hematuria.  Musculoskeletal:  Negative for arthralgias and back pain.  Skin:  Positive for  wound (laceration right index finger). Negative for color change and rash.  Neurological:  Negative for seizures and syncope.  All other systems reviewed and are negative.   Physical Exam Triage Vital Signs ED Triage Vitals  Enc Vitals Group     BP 06/17/21 1548 111/77     Pulse Rate 06/17/21 1548 80     Resp 06/17/21 1548 18     Temp 06/17/21 1548 98.8 F (37.1 C)     Temp Source 06/17/21 1548 Oral     SpO2 06/17/21 1548 94 %     Weight --      Height --      Head Circumference --      Peak Flow --      Pain Score 06/17/21 1547 0     Pain Loc --      Pain Edu? --      Excl. in [place]? --    No data found.  Updated Vital Signs BP 111/77 (BP Location: Right Arm)   Pulse 80   Temp 98.8 F (37.1 C) (Oral)   Resp 18   SpO2 94%   Visual Acuity Right Eye Distance:   Left Eye Distance:   Bilateral Distance:    Right Eye Near:   Left Eye Near:    Bilateral Near:     Physical Exam Vitals and nursing note reviewed.  Constitutional:      General: He is not in acute distress.    Appearance: He is well-developed.  HENT:     Head: Normocephalic and atraumatic.  Eyes:     Conjunctiva/sclera: Conjunctivae normal.  Cardiovascular:     Rate and Rhythm: Normal rate and regular rhythm.     Heart sounds: No murmur heard. Pulmonary:     Effort: Pulmonary effort is normal. No respiratory distress.     Breath sounds: Normal breath sounds.  Abdominal:     Palpations: Abdomen is soft.     Tenderness: There is no abdominal tenderness.  Musculoskeletal:        General: No swelling.     Right  hand: Laceration (rigt index finger laceration) present.     Cervical back: Neck supple.     Comments: Right index finger laceration, lateral aspect.  Approximately 1 cm in length.  Wound well approximated with steri strips in place, no active bleeding.  Wound approximates well even with flexion and extension of finger.   Skin:    General: Skin is warm and dry.     Capillary Refill: Capillary  refill takes less than 2 seconds.  Neurological:     Mental Status: He is alert.  Psychiatric:        Mood and Affect: Mood normal.     UC Treatments / Results  Labs (all labs ordered are listed, but only abnormal results are displayed) Labs Reviewed - No data to display  EKG   Radiology No results found.  Procedures Procedures (including critical care time)  Medications Ordered in UC Medications  Tdap (BOOSTRIX) injection 0.5 mL (has no administration in time range)    Initial Impression / Assessment and Plan / UC Course  I have reviewed the triage vital signs and the nursing notes.  Pertinent labs & imaging results that were available during my care of the patient were reviewed by me and considered in my medical decision making (see chart for details).     Right index finger laceration.  Opted to keep current steri strips in place as wound is well approximated, does not open with flexion and extension, and bleeding is controlled.   There is no tdap available in this clinic.  Advised pt to go to the [facility] clinic, [place], or his PCP for tdap.    Wound care discussed. Return precautions discussed.  Final Clinical Impressions(s) / UC Diagnoses   Final diagnoses:  Laceration of right index finger without damage to nail, foreign body presence unspecified, initial encounter     Discharge Instructions      Keep steri strips in place for 5-7 days.  Keep clean and dry  For Tdap follow up in [facility] clinic tomorrow, at [facility], or call Primary Care Physician on Monday.     ED Prescriptions   None    PDMP not reviewed this encounter.   Ward, Lenise Arena, PA-C 06/17/21 1635

## 2021-06-19 ENCOUNTER — Other Ambulatory Visit: Payer: Self-pay | Admitting: Thoracic Surgery (Cardiothoracic Vascular Surgery)

## 2021-06-19 LAB — SARS CORONAVIRUS 2 (TAT 6-24 HRS): SARS Coronavirus 2: NEGATIVE

## 2021-06-21 ENCOUNTER — Ambulatory Visit (HOSPITAL_COMMUNITY)
Admission: RE | Admit: 2021-06-21 | Discharge: 2021-06-21 | Disposition: A | Discharge status: Home / Self Care | Payer: Medicare PPO | Source: Ambulatory Visit | Attending: Pulmonary Disease | Admitting: Pulmonary Disease

## 2021-06-21 DIAGNOSIS — J849 Interstitial pulmonary disease, unspecified: Secondary | ICD-10-CM | POA: Insufficient documentation

## 2021-06-21 LAB — PULMONARY FUNCTION TEST
DL/VA % pred: 83 %
DL/VA: 3.27 ml/min/mmHg/L
DLCO unc % pred: 80 %
DLCO unc: 23.07 ml/min/mmHg
FEF 25-75 Post: 3.57 L/sec
FEF 25-75 Pre: 3.02 L/sec
FEF2575-%Change-Post: 18 %
FEF2575-%Pred-Post: 131 %
FEF2575-%Pred-Pre: 111 %
FEV1-%Change-Post: 3 %
FEV1-%Pred-Post: 104 %
FEV1-%Pred-Pre: 101 %
FEV1-Post: 3.85 L
FEV1-Pre: 3.72 L
FEV1FVC-%Change-Post: 0 %
FEV1FVC-%Pred-Pre: 104 %
FEV6-%Change-Post: 3 %
FEV6-%Pred-Post: 104 %
FEV6-%Pred-Pre: 101 %
FEV6-Post: 4.96 L
FEV6-Pre: 4.79 L
FEV6FVC-%Change-Post: 0 %
FEV6FVC-%Pred-Post: 104 %
FEV6FVC-%Pred-Pre: 103 %
FVC-%Change-Post: 2 %
FVC-%Pred-Post: 100 %
FVC-%Pred-Pre: 97 %
FVC-Post: 5.01 L
FVC-Pre: 4.88 L
Post FEV1/FVC ratio: 77 %
Post FEV6/FVC ratio: 99 %
Pre FEV1/FVC ratio: 76 %
Pre FEV6/FVC Ratio: 98 %
RV % pred: 104 %
RV: 2.84 L
TLC % pred: 103 %
TLC: 8.12 L

## 2021-06-21 MED ORDER — ALBUTEROL SULFATE (2.5 MG/3ML) 0.083% IN NEBU
2.5000 mg | INHALATION_SOLUTION | Freq: Once | RESPIRATORY_TRACT | Status: AC
  Administered 2021-06-21: 2.5 mg via RESPIRATORY_TRACT

## 2021-06-22 ENCOUNTER — Ambulatory Visit: Payer: Medicare PPO | Admitting: Family

## 2021-06-22 VITALS — BP 124/73 | HR 58 | Temp 98.4°F | Ht 74.0 in | Wt 185.2 lb

## 2021-06-22 DIAGNOSIS — Z23 Encounter for immunization: Secondary | ICD-10-CM

## 2021-06-22 DIAGNOSIS — T300 Burn of unspecified body region, unspecified degree: Secondary | ICD-10-CM

## 2021-06-22 MED ORDER — SILVER SULFADIAZINE 1 % EX CREA: 1.0000 "application " | TOPICAL_CREAM | Freq: Two times a day (BID) | CUTANEOUS | 0 refills | Status: DC

## 2021-06-22 MED ORDER — CEPHALEXIN 500 MG PO CAPS: 500.0000 mg | ORAL_CAPSULE | Freq: Three times a day (TID) | ORAL | 0 refills | Status: AC

## 2021-06-22 NOTE — Progress Notes (Signed)
Russell [name] is a 75 y.o. 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 ([place]) 05/23/2021   Former smoker 05/05/2021   Lung nodule 04/21/2021   Elevated PSA 02/16/2021   Penile edema 10/05/2020   Erectile dysfunction due to arterial insufficiency 05/11/2020   Gross hematuria 05/11/2020   Benign prostatic hyperplasia with urinary obstruction 04/11/2020   Urinary retention 04/11/2020   DDD (degenerative disc disease), cervical 06/11/2018   DDD (degenerative disc disease), lumbar 06/11/2018   History of kidney stones 06/11/2018   Cerebellar infarct ([place]) 06/11/2018   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[place]) 06/11/2018   Primary osteoarthritis of left knee 06/11/2018   Primary osteoarthritis of both feet 06/11/2018   Plantar fasciitis 06/11/2018   Hx of colonic polyp 05/29/2018   Left lumbar radiculopathy 02/77/4128   Diastolic dysfunction 78/67/6720   Psoriasis 10/15/2016   DEGENERATIVE JOINT DISEASE 02/07/2009   HEADACHE 02/07/2009   GERD 10/02/2007   HYPERTENSION, BENIGN ESSENTIAL 10/08/2006   TESTOSTERONE DEFICIENCY 09/10/2006   ERECTILE DYSFUNCTION 09/10/2006   Prolapsed internal hemorrhoids, grade 2 and 3 09/10/2006   PLANTAR FASCIITIS, BILATERAL 09/10/2006   SLEEP APNEA 09/10/2006   MALAISE AND FATIGUE 09/10/2006    Current Outpatient Medications  Medication Sig Dispense Refill   aspirin 81 MG EC tablet Take 81 mg by mouth every evening.     cephALEXin (KEFLEX) 500 MG capsule Take 1 capsule (500 mg total) by mouth 3 (three) times daily for 7 days. 21 capsule 0   chlorhexidine (PERIDEX) 0.12 % solution SMARTSIG:By Mouth     clotrimazole-betamethasone (LOTRISONE) cream Apply 1 application topically 2 (two) times daily. 30 g 0   docusate sodium (COLACE) 100 MG capsule Take 200 mg by mouth daily as needed for mild constipation.     finasteride (PROSCAR) 5 MG tablet Take  1 tablet (5 mg total) by mouth daily. 90 tablet 3   gabapentin (NEURONTIN) 300 MG capsule Take 300 mg by mouth 3 (three) times daily as needed (severe pain).     Ginger, Zingiber officinalis, (GINGER PO) Take 4 g by mouth daily.     Magnesium 400 MG CAPS Take 400 mg by mouth 2 (two) times daily. asporotate     melatonin 3 MG TABS tablet Take 3 mg by mouth at bedtime.     MISC NATURAL PRODUCTS PO Take 1 capsule by mouth daily. Nettle  Root Extract     Omega-3 Fatty Acids (FISH OIL) 1200 MG CAPS Take 1,200 mg by mouth daily.     omeprazole (PRILOSEC) 40 MG capsule Take 40 mg by mouth daily in the afternoon.     OVER THE COUNTER MEDICATION Apply 1 application topically 2 (two) times daily. Topical CBD oil Lower back and left leg     polyethylene glycol powder (GLYCOLAX/MIRALAX) 17 GM/SCOOP powder Take 0.5 Containers by mouth daily as needed for moderate constipation or mild constipation.     Probiotic Product (PROBIOTIC DAILY PO) Take 1 tablet by mouth 2 (two) times daily.     senna-docusate (SENOKOT-S) 8.6-50 MG tablet Take 2 tablets by mouth at bedtime as needed for mild constipation.     silodosin (RAPAFLO) 8 MG CAPS capsule Take 1 capsule (8 mg total) by mouth daily with breakfast. 30 capsule 11   silver sulfADIAZINE (SILVADENE) 1 % cream Apply 1 application topically 2 (two) times daily. 50 g 0   SOOLANTRA 1 %  CREA Apply 1 application topically daily as needed (Rosacea).  5   Tart Cherry 1200 MG CAPS Take 3,600 capsules by mouth daily.     TURMERIC PO Take 750 mg by mouth daily. curamed curcumin     Zinc Sulfate (ZINC 15 PO) Take 15 mg by mouth daily.     penicillin v potassium (VEETID) 500 MG tablet Take 500 mg by mouth 4 (four) times daily.     No current facility-administered medications for this visit.    Allergies: Patient has no known allergies.  Past Medical History:  Diagnosis Date   Anemia 2019   Arachnoiditis    Arthritis    Bleeding internal hemorrhoids 09/10/2006    Qualifier: Diagnosis of  By: Jonna Munro MD, Cornelius     Cauda equina syndrome [facility]) 2011   Cerebellar infarct [facility])    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   anterior cer disc and fusion with plates C3-4 and H4-7 at [facility]   Constipation    Enlarged prostate    GERD (gastroesophageal reflux disease)    Hemorrhoids    History of kidney stones    passed stones   History of lumbar laminectomy 05/12/2020   open L2-L5 at [facility]   Hx of colonic polyp 05/29/2018   05/2018 cecal ssp   Lung nodule 02/2021   Maxillary sinus polyp    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   Prolapsed internal hemorrhoids, grade 3 09/10/2006   Qualifier: Diagnosis of  By: Jonna Munro MD, Cornelius     Salivary gland enlargement    Sleep apnea 10 years ago   declined CPAP   Stroke ([place]) 15-20 years ago    Past Surgical History:  Procedure Laterality Date   BRONCHIAL BIOPSY  05/16/2021   Procedure: BRONCHIAL BIOPSIES;  Surgeon: Garner Nash, DO;  Location: [place];  Service: Pulmonary;;   BRONCHIAL BRUSHINGS  05/16/2021   Procedure: BRONCHIAL BRUSHINGS;  Surgeon: Garner Nash, DO;  Location: [place];  Service: Pulmonary;;   BRONCHIAL NEEDLE ASPIRATION BIOPSY  05/16/2021   Procedure: BRONCHIAL NEEDLE ASPIRATION BIOPSIES;  Surgeon: Garner Nash, DO;  Location: [place] ENDOSCOPY;  Service: Pulmonary;;   CERVICAL SPINE SURGERY  04/10/2020   C3-C4 with pin at [facility]   COLONOSCOPY     FIDUCIAL MARKER PLACEMENT  05/16/2021   Procedure: FIDUCIAL MARKER PLACEMENT;  Surgeon: Garner Nash, DO;  Location: [place] [facility];  Service: Pulmonary;;   HEMORRHOID BANDING     LUMBAR LAMINECTOMY  03/08/2010   L2-3, cauda equina syndrome   lumbar laminectomy  05/12/2020   [facility]   UPPER GI ENDOSCOPY     VIDEO BRONCHOSCOPY WITH ENDOBRONCHIAL NAVIGATION Left 05/16/2021   Procedure: VIDEO BRONCHOSCOPY WITH ENDOBRONCHIAL NAVIGATION;  Surgeon: Garner Nash, DO;  Location: [place];  Service: Pulmonary;  Laterality: Left;  ION w/  fiducial placement   VIDEO BRONCHOSCOPY WITH RADIAL ENDOBRONCHIAL ULTRASOUND  05/16/2021   Procedure: VIDEO BRONCHOSCOPY WITH RADIAL ENDOBRONCHIAL ULTRASOUND;  Surgeon: Garner Nash, DO;  Location: [name];  Service: Pulmonary;;   wisdom teeth ext      Family History  Problem Relation Age of Onset   Aortic aneurysm Mother    Heart attack Father    Aortic aneurysm Sister    CVA Sister    Aortic aneurysm Sister    Aortic aneurysm Sister    Colon cancer Neg Hx    Pancreatic cancer Neg Hx    Rectal cancer Neg Hx    Stomach cancer Neg  Hx     Social History   Tobacco Use   Smoking status: Former    Packs/day: 0.25    Types: Cigarettes    Start date: 10/15/1965    Quit date: 10/16/1974    Years since quitting: 46.7   Smokeless tobacco: Never  Substance Use Topics   Alcohol use: Not Currently    Comment: rarely wine    Subjective:   Accompanied by wife;  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 that limit his ability to feel sensation on lower left extremity; was having increased back pain recently and sat on heating pad to try and help with back pain; unfortunately, he did not realize that his skin was burning on the heating pad; burn occurred Sunday or Monday;   Also requesting Tdap to be updated due to finger injury that occurred last week; [facility] did not have Tdap and recommended that he get with PCP;      Objective:  Vitals:   06/22/21 1515  BP: 124/73  Pulse: (!) 58  Temp: 98.4 F (36.9 C)  TempSrc: Oral  SpO2: 100%  Weight: 185 lb 3.2 oz (84 kg)  Height: 6\' 2"  (1.88 m)    General: Well developed, well nourished, in no acute distress  Skin : Warm and dry. Dark, scabbed area noted at base of left buttock with mild surrounding erythema; no streaking;  Head: Normocephalic and atraumatic  Eyes: Sclera and conjunctiva clear; pupils round and reactive to light; extraocular movements intact  Ears: External normal; canals clear; tympanic membranes normal   Oropharynx: Pink, supple. No suspicious lesions  Neck: Supple without thyromegaly, adenopathy  Lungs: Respirations unlabored;  Neurologic: Alert and oriented; speech intact; face symmetrical; uses walker for stability;  Assessment:  1. Burn     Plan:  Appearance raises question of eschar but will treat as stage 1 or stage 2; encouraged to keep area moist and covered; Rx for Bactroban sent to pharmacy- use bid-tid; start Keflex after finishing PCN for oral infection; if area does not look improved by early next week, patient to call back and will need to get to wound specialist;   Tdap updated;   This visit occurred during the SARS-CoV-2 public health emergency.  Safety protocols were in place, including screening questions prior to the visit, additional usage of staff PPE, and extensive cleaning of exam room while observing appropriate contact time as indicated for disinfecting solutions.    No follow-ups on file.  Orders Placed This Encounter  Procedures   Tdap vaccine greater than or equal to 7yo IM    Requested Prescriptions   Signed Prescriptions Disp Refills   silver sulfADIAZINE (SILVADENE) 1 % cream 50 g 0    Sig: Apply 1 application topically 2 (two) times daily.   cephALEXin (KEFLEX) 500 MG capsule 21 capsule 0    Sig: Take 1 capsule (500 mg total) by mouth 3 (three) times daily for 7 days.

## 2021-06-22 NOTE — Patient Instructions (Signed)
Start the Keflex once you finish your PCN; Please use the Silvadene Cream 2-3 x per day and keep the area covered;  Follow up if the area is not improving;

## 2021-06-23 ENCOUNTER — Encounter: Payer: Medicare PPO | Admitting: Thoracic Surgery (Cardiothoracic Vascular Surgery)

## 2021-06-27 ENCOUNTER — Other Ambulatory Visit: Payer: Self-pay | Admitting: Internal Medicine

## 2021-06-29 LAB — ACID FAST CULTURE WITH REFLEXED SENSITIVITIES (MYCOBACTERIA)
Acid Fast Culture: NEGATIVE
Acid Fast Culture: NEGATIVE

## 2021-06-29 NOTE — Progress Notes (Signed)
[address]       [place] [address]             [phone]                    Russell [name]  Medical Record #[id number] Date of Birth: 13-Jul-1946  Referring: Garner Nash, DO Primary Care: Marrian Salvage, [place] Primary Cardiologist: None  Chief Complaint:    Chief Complaint  Patient presents with   Lung Lesion    Surgical consult, Chest CT 03/21/21, Bronch 05/16/21, PET Scan 06/01/21, PFT's 06/21/21    History of Present Illness:    Russell [name] 75 y.o. male referred by Dr. 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  He is a former smoker and also has a history of interstitial lung disease, and was followed for this pulmonary nodule.  It is slowly grown over time.  He subsequently underwent navigational bronchoscopy with biopsy which was positive for adenocarcinoma.  Patient is unaware of his diagnosis of interstitial lung disease, and is never had a biopsy for this.  He spent most of our visit discussing his wife's ovarian cancer as well as his neuropathic pain.  He denies any shortness of breath or neurologic symptoms.       Zubrod Score: At the time of surgery this patients most appropriate activity status/level should be described as: []     0    Normal activity, no symptoms [x]     1    Restricted in physical strenuous activity but ambulatory, able to do out light work []     2    Ambulatory and capable of self care, unable to do work activities, up and about               >50 % of waking hours                              []     3    Only limited self care, in bed greater than 50% of waking hours []     4    Completely disabled, no self care, confined to bed or chair []     5    Moribund   Past Medical History:  Diagnosis Date   Anemia 2019   Arachnoiditis    Arthritis    Bleeding internal hemorrhoids 09/10/2006   Qualifier: Diagnosis of  By: Jonna Munro MD, Cornelius     Cauda  equina syndrome [facility]) 2011   Cerebellar infarct [facility])    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   anterior cer disc and fusion with plates C3-4 and Y7-7 at [facility]   Constipation    Enlarged prostate    GERD (gastroesophageal reflux disease)    Hemorrhoids    History of kidney stones    passed stones   History of lumbar laminectomy 05/12/2020   open L2-L5 at [facility]   Hx of colonic polyp 05/29/2018   05/2018 cecal ssp   Lung nodule 02/2021   Maxillary sinus polyp    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   Prolapsed internal hemorrhoids, grade 3 09/10/2006   Qualifier: Diagnosis of  By: Jonna Munro MD, Cornelius     Salivary gland enlargement    Sleep apnea 10 years ago   declined CPAP   Stroke ([place]) 15-20 years ago    Past Surgical History:  Procedure Laterality Date   BRONCHIAL BIOPSY  05/16/2021   Procedure: BRONCHIAL BIOPSIES;  Surgeon: Garner Nash, DO;  Location: [place] [facility];  Service: Pulmonary;;   BRONCHIAL BRUSHINGS  05/16/2021   Procedure: BRONCHIAL BRUSHINGS;  Surgeon: Garner Nash, DO;  Location: [place] END[facility];  Service: Pulmonary;;   BRONCHIAL NEEDLE ASPIRATION BIOPSY  05/16/2021   Procedure: BRONCHIAL NEEDLE ASPIRATION BIOPSIES;  Surgeon: Garner Nash, DO;  Location: [place] ENDOSCOPY;  Service: Pulmonary;;   CERVICAL SPINE SURGERY  04/10/2020   C3-C4 with pin at [facility]   COLONOSCOPY     FIDUCIAL MARKER PLACEMENT  05/16/2021   Procedure: FIDUCIAL MARKER PLACEMENT;  Surgeon: Garner Nash, DO;  Location: [place] ENDOSCOPY;  Service: Pulmonary;;   HEMORRHOID BANDING     LUMBAR LAMINECTOMY  03/08/2010   L2-3, cauda equina syndrome   lumbar laminectomy  05/12/2020   [facility]   UPPER GI ENDOSCOPY     VIDEO BRONCHOSCOPY WITH ENDOBRONCHIAL NAVIGATION Left 05/16/2021   Procedure: VIDEO BRONCHOSCOPY WITH ENDOBRONCHIAL NAVIGATION;  Surgeon: Garner Nash, DO;  Location: [place];  Service: Pulmonary;  Laterality: Left;  ION w/ fiducial placement   VIDEO BRONCHOSCOPY WITH RADIAL  ENDOBRONCHIAL ULTRASOUND  05/16/2021   Procedure: VIDEO BRONCHOSCOPY WITH RADIAL ENDOBRONCHIAL ULTRASOUND;  Surgeon: Garner Nash, DO;  Location[name];  Service: Pulmonary;;   wisdom teeth ext      Family History  Problem Relation Age of Onset   Aortic aneurysm Mother    Heart attack Father    Aortic aneurysm Sister    CVA Sister    Aortic aneurysm Sister    Aortic aneurysm Sister    Colon cancer Neg Hx    Pancreatic cancer Neg Hx    Rectal cancer Neg Hx    Stomach cancer Neg Hx      Social History   Tobacco Use  Smoking Status Former   Packs/day: 0.25   Types: Cigarettes   Start date: 10/15/1965   Quit date: 10/16/1974   Years since quitting: 46.7  Smokeless Tobacco Never    Social History   Substance and Sexual Activity  Alcohol Use Not Currently   Comment: rarely wine     No Known Allergies  Current Outpatient Medications  Medication Sig Dispense Refill   aspirin 81 MG EC tablet Take 81 mg by mouth every evening.     chlorhexidine (PERIDEX) 0.12 % solution SMARTSIG:By Mouth     clotrimazole-betamethasone (LOTRISONE) cream Apply 1 application topically 2 (two) times daily. 30 g 0   docusate sodium (COLACE) 100 MG capsule Take 200 mg by mouth daily as needed for mild constipation.     finasteride (PROSCAR) 5 MG tablet Take 1 tablet (5 mg total) by mouth daily. 90 tablet 3   gabapentin (NEURONTIN) 300 MG capsule Take 300 mg by mouth 3 (three) times daily as needed (severe pain).     Ginger, Zingiber officinalis, (GINGER PO) Take 4 g by mouth daily.     Magnesium 400 MG CAPS Take 400 mg by mouth 2 (two) times daily. asporotate     melatonin 3 MG TABS tablet Take 3 mg by mouth at bedtime.     MISC NATURAL PRODUCTS PO Take 1 capsule by mouth daily. Nettle  Root Extract     Omega-3 Fatty Acids (FISH OIL) 1200 MG CAPS Take 1,200 mg by mouth daily.     omeprazole (PRILOSEC) 40 MG capsule TAKE ONE CAPSULE BY MOUTH DAILY 90 capsule 0   OVER THE COUNTER  MEDICATION  Apply 1 application topically 2 (two) times daily. Topical CBD oil Lower back and left leg     penicillin v potassium (VEETID) 500 MG tablet Take 500 mg by mouth 4 (four) times daily.     polyethylene glycol powder (GLYCOLAX/MIRALAX) 17 GM/SCOOP powder Take 0.5 Containers by mouth daily as needed for moderate constipation or mild constipation.     Probiotic Product (PROBIOTIC DAILY PO) Take 1 tablet by mouth 2 (two) times daily.     senna-docusate (SENOKOT-S) 8.6-50 MG tablet Take 2 tablets by mouth at bedtime as needed for mild constipation.     silodosin (RAPAFLO) 8 MG CAPS capsule Take 1 capsule (8 mg total) by mouth daily with breakfast. 30 capsule 11   silver sulfADIAZINE (SILVADENE) 1 % cream Apply 1 application topically 2 (two) times daily. 50 g 0   SOOLANTRA 1 % CREA Apply 1 application topically daily as needed (Rosacea).  5   Tart Cherry 1200 MG CAPS Take 3,600 capsules by mouth daily.     TURMERIC PO Take 750 mg by mouth daily. curamed curcumin     Zinc Sulfate (ZINC 15 PO) Take 15 mg by mouth daily.     No current facility-administered medications for this visit.    Review of Systems  Constitutional:  Negative for malaise/fatigue and weight loss.  Respiratory:  Negative for cough and shortness of breath.   Cardiovascular:  Negative for chest pain.  Musculoskeletal:  Positive for back pain, joint pain and myalgias.  Neurological: Negative.     PHYSICAL EXAMINATION: BP 122/82    Pulse 72    Resp 20    Ht 6\' 2"  (1.88 m)    Wt 180 lb (81.6 kg)    SpO2 100% Comment: RA   BMI 23.11 kg/m  Physical Exam  Diagnostic Studies & Laboratory data:     Recent Radiology Findings:   NM PET Image Initial (PI) Skull Base To Thigh  Result Date: 06/03/2021 CLINICAL DATA:  Initial treatment strategy for non-small non-small cell lung cancer LEFT lung biopsy. EXAM: NUCLEAR MEDICINE PET SKULL BASE TO THIGH TECHNIQUE: 9.5 mCi F-18 FDG was injected intravenously. Full-ring PET imaging was  performed from the skull base to thigh after the radiotracer. CT data was obtained and used for attenuation correction and anatomic localization. Fasting blood glucose: 101 mg/dl COMPARISON:  CT 03/21/2021 FINDINGS: Mediastinal blood pool activity: SUV max 1.9 Liver activity: SUV max NA NECK: No hypermetabolic lymph nodes in the neck. Incidental CT findings: none CHEST: LEFT lobe sub solid nodule is again demonstrated measuring 2.3 x 2.1 cm unchanged in size from comparison CT 03/21/2021. Nodule has very low metabolic activity (SUV max equal 1.9) which is equal to background blood pool. No additional pulmonary nodules. No hypermetabolic mediastinal lymph nodes. Incidental CT findings: none ABDOMEN/PELVIS: Normal adrenal glands. Several low-density lesions in the liver without metabolic activity consistent with cysts. No hypermetabolic abdominopelvic lymph nodes. Incidental CT findings: Prostate enlargement. SKELETON: No focal hypermetabolic activity to suggest skeletal metastasis. Benign lipoma in the RIGHT biceps compartment. Incidental CT findings: none IMPRESSION: 1. No significant metabolic activity associated with the sub solid LEFT lower lobe pulmonary nodule. Findings remain concerning for low-grade adenocarcinoma. 2. No metastatic mediastinal adenopathy or distant metastatic disease. Electronically Signed   By: Suzy Bouchard M.D.   On: 06/03/2021 10:21       I have independently reviewed the above radiology studies  and reviewed the findings with the patient.   Recent Lab Findings: Lab  Results  Component Value Date   WBC 5.0 05/16/2021   HGB 12.7 (L) 05/16/2021   HCT 40.5 05/16/2021   PLT 201 05/16/2021   GLUCOSE 97 05/16/2021   CHOL 154 02/10/2009   TRIG 77 02/10/2009   HDL 53 02/10/2009   LDLCALC 86 02/10/2009   ALT 16 09/28/2019   AST 21 09/28/2019   NA 137 05/16/2021   K 3.8 05/16/2021   CL 105 05/16/2021   CREATININE 0.68 05/16/2021   BUN 18 05/16/2021   CO2 25 05/16/2021    TSH 1.64 09/28/2019   HGBA1C 5.6 09/29/2019     PFTs:  - FVC: 97% - FEV1: 101% -DLCO: 80%  Problem List: 2.3 cm Left lower lobe adenocarcinoma Interstitial lung disease    Assessment / Plan:   75 year old male with a biopsy-proven left lower lobe adenocarcinoma.  He also has a history of interstitial lung disease, but his pulmonary function testings are adequate.  We discussed the risks and benefits of surgical resection versus radiation therapy.  The patient seem to perseverate on his wife's treatment here at [facility] and having a better experience at [facility].  He would like to obtain a second opinion.  I have referred him to thoracic surgery at [facility].  I  spent 60 minutes with  the patient face to face in counseling and coordination of care.    Lajuana Matte 06/30/2021 3:53 PM

## 2021-06-30 ENCOUNTER — Institutional Professional Consult (permissible substitution): Payer: Medicare PPO | Admitting: Thoracic Surgery (Cardiothoracic Vascular Surgery)

## 2021-06-30 ENCOUNTER — Other Ambulatory Visit: Payer: Self-pay

## 2021-06-30 VITALS — BP 122/82 | HR 72 | Resp 20 | Ht 74.0 in | Wt 180.0 lb

## 2021-06-30 DIAGNOSIS — R911 Solitary pulmonary nodule: Secondary | ICD-10-CM | POA: Diagnosis not present

## 2021-07-06 ENCOUNTER — Encounter: Payer: Self-pay | Admitting: Family

## 2021-07-08 ENCOUNTER — Encounter (HOSPITAL_COMMUNITY): Payer: Self-pay | Admitting: Emergency Medicine

## 2021-07-08 ENCOUNTER — Emergency Department (HOSPITAL_COMMUNITY)
Admission: EM | Admit: 2021-07-08 | Discharge: 2021-07-08 | Disposition: A | Discharge status: Home / Self Care | Payer: Medicare PPO | Attending: Emergency Medicine | Admitting: Emergency Medicine

## 2021-07-08 ENCOUNTER — Other Ambulatory Visit: Payer: Self-pay

## 2021-07-08 DIAGNOSIS — Z85118 Personal history of other malignant neoplasm of bronchus and lung: Secondary | ICD-10-CM | POA: Diagnosis not present

## 2021-07-08 DIAGNOSIS — Z87891 Personal history of nicotine dependence: Secondary | ICD-10-CM | POA: Insufficient documentation

## 2021-07-08 DIAGNOSIS — T310 Burns involving less than 10% of body surface: Secondary | ICD-10-CM | POA: Diagnosis not present

## 2021-07-08 DIAGNOSIS — Z7982 Long term (current) use of aspirin: Secondary | ICD-10-CM | POA: Diagnosis not present

## 2021-07-08 DIAGNOSIS — Z79899 Other long term (current) drug therapy: Secondary | ICD-10-CM | POA: Insufficient documentation

## 2021-07-08 DIAGNOSIS — T2105XA Burn of unspecified degree of buttock, initial encounter: Secondary | ICD-10-CM | POA: Diagnosis not present

## 2021-07-08 DIAGNOSIS — I10 Essential (primary) hypertension: Secondary | ICD-10-CM | POA: Insufficient documentation

## 2021-07-08 DIAGNOSIS — X16XXXA Contact with hot heating appliances, radiators and pipes, initial encounter: Secondary | ICD-10-CM | POA: Diagnosis not present

## 2021-07-08 DIAGNOSIS — T2125XA Burn of second degree of buttock, initial encounter: Secondary | ICD-10-CM | POA: Diagnosis not present

## 2021-07-08 NOTE — ED Provider Notes (Signed)
[facility] EMERGENCY DEPARTMENT Provider Note   CSN: [id number] Arrival date & time: 07/08/21  9629     History Chief Complaint  Patient presents with   Wound Check    Russell [name] is a 75 y.o. 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  Patient states that due to his arachnoiditis he utilizes a heating pad to the area for pain relief.  Patient states on 12/4 he was utilizing his heating pad and he left it on for an extended period of time which resulted in a burn.  Patient states that he sought medical treatment with his primary care doctor on 12/8 who prescribed him silver sulfadiazine and instructed him to apply the ointment and keep the area covered.  Patient states that since 12/8 his wound on his left buttock has not shown any signs of healing and recently began to "weep".    Patient states that the appearance of the moisture coming from this wound was clear.  Patient states he contacted his PCP yesterday who advised him to report to ED for evaluation.Patient denies any nausea, vomiting, diarrhea, fevers, abdominal pain.  Patient is here requesting referral to a burn specialist at his PCPs instruction.   Wound Check       Past Medical History:  Diagnosis Date   Anemia 2019   Arachnoiditis    Arthritis    Bleeding internal hemorrhoids 09/10/2006   Qualifier: Diagnosis of  By: Jonna Munro MD, Cornelius     Cauda equina syndrome [facility]) 2011   Cerebellar infarct [facility])    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   anterior cer disc and fusion with plates C3-4 and B2-8 at [facility]   Constipation    Enlarged prostate    GERD (gastroesophageal reflux disease)    Hemorrhoids    History of kidney stones    passed stones   History of lumbar laminectomy 05/12/2020   open L2-L5 at [facility]   Hx of colonic polyp 05/29/2018   05/2018 cecal ssp   Lung nodule 02/2021   Maxillary sinus polyp    Memory loss    related  to age   Prolapsed internal hemorrhoids, grade 3 09/10/2006   Qualifier: Diagnosis of  By: Jonna Munro MD, Cornelius     Salivary gland enlargement    Sleep apnea 10 years ago   declined CPAP   Stroke ([place]) 15-20 years ago    Patient Active Problem List   Diagnosis Date Noted   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 ([place]) 05/23/2021   Former smoker 05/05/2021   Lung nodule 04/21/2021   Elevated PSA 02/16/2021   Penile edema 10/05/2020   Erectile dysfunction due to arterial insufficiency 05/11/2020   Gross hematuria 05/11/2020   Benign prostatic hyperplasia with urinary obstruction 04/11/2020   Urinary retention 04/11/2020   DDD (degenerative disc disease), cervical 06/11/2018   DDD (degenerative disc disease), lumbar 06/11/2018   History of kidney stones 06/11/2018   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[place]) 06/11/2018   Primary osteoarthritis of left knee 06/11/2018   Primary osteoarthritis of both feet 06/11/2018   Plantar fasciitis 06/11/2018   Hx of colonic polyp 05/29/2018   Left lumbar radiculopathy 41/32/4401   Diastolic dysfunction 02/72/5366   Psoriasis 10/15/2016   DEGENERATIVE JOINT DISEASE 02/07/2009   HEADACHE 02/07/2009   GERD 10/02/2007   HYPERTENSION, BENIGN ESSENTIAL 10/08/2006   TESTOSTERONE DEFICIENCY 09/10/2006   ERECTILE DYSFUNCTION 09/10/2006  Prolapsed internal hemorrhoids, grade 2 and 3 09/10/2006   PLANTAR FASCIITIS, BILATERAL 09/10/2006   SLEEP APNEA 09/10/2006   MALAISE AND FATIGUE 09/10/2006    Past Surgical History:  Procedure Laterality Date   BRONCHIAL BIOPSY  05/16/2021   Procedure: BRONCHIAL BIOPSIES;  Surgeon: Garner Nash, DO;  Location: [place] [facility];  Service: Pulmonary;;   BRONCHIAL BRUSHINGS  05/16/2021   Procedure: BRONCHIAL BRUSHINGS;  Surgeon: Garner Nash, DO;  Location: [place] [facility];  Service: Pulmonary;;   BRONCHIAL NEEDLE ASPIRATION BIOPSY  05/16/2021   Procedure: BRONCHIAL NEEDLE  ASPIRATION BIOPSIES;  Surgeon: Garner Nash, DO;  Location: [place] [facility];  Service: Pulmonary;;   CERVICAL SPINE SURGERY  04/10/2020   C3-C4 with pin at [facility]   COLONOSCOPY     FIDUCIAL MARKER PLACEMENT  05/16/2021   Procedure: FIDUCIAL MARKER PLACEMENT;  Surgeon: Garner Nash, DO;  Location: [place] [facility];  Service: Pulmonary;;   HEMORRHOID BANDING     LUMBAR LAMINECTOMY  03/08/2010   L2-3, cauda equina syndrome   lumbar laminectomy  05/12/2020   [facility]   UPPER GI ENDOSCOPY     VIDEO BRONCHOSCOPY WITH ENDOBRONCHIAL NAVIGATION Left 05/16/2021   Procedure: VIDEO BRONCHOSCOPY WITH ENDOBRONCHIAL NAVIGATION;  Surgeon: Garner Nash, DO;  Location: [place];  Service: Pulmonary;  Laterality: Left;  ION w/ fiducial placement   VIDEO BRONCHOSCOPY WITH RADIAL ENDOBRONCHIAL ULTRASOUND  05/16/2021   Procedure: VIDEO BRONCHOSCOPY WITH RADIAL ENDOBRONCHIAL ULTRASOUND;  Surgeon: Garner Nash, DO;  Location: [name];  Service: Pulmonary;;   wisdom teeth ext         Family History  Problem Relation Age of Onset   Aortic aneurysm Mother    Heart attack Father    Aortic aneurysm Sister    CVA Sister    Aortic aneurysm Sister    Aortic aneurysm Sister    Colon cancer Neg Hx    Pancreatic cancer Neg Hx    Rectal cancer Neg Hx    Stomach cancer Neg Hx     Social History   Tobacco Use   Smoking status: Former    Packs/day: 0.25    Types: Cigarettes    Start date: 10/15/1965    Quit date: 10/16/1974    Years since quitting: 46.7   Smokeless tobacco: Never  Vaping Use   Vaping Use: Never used  Substance Use Topics   Alcohol use: Not Currently    Comment: rarely wine   Drug use: No    Home Medications Prior to Admission medications   Medication Sig Start Date End Date Taking? Authorizing Provider  aspirin 81 MG EC tablet Take 81 mg by mouth every evening. 05/18/20   [provider]  chlorhexidine (PERIDEX) 0.12 % solution SMARTSIG:By Mouth 06/16/21   [provider]  clotrimazole-betamethasone (LOTRISONE) cream Apply 1 application topically 2 (two) times daily. 10/05/20   McKenzie, Candee Furbish, MD  docusate sodium (COLACE) 100 MG capsule Take 200 mg by mouth daily as needed for mild constipation.    [provider]  finasteride (PROSCAR) 5 MG tablet Take 1 tablet (5 mg total) by mouth daily. 06/12/21   McKenzie, Candee Furbish, MD  gabapentin (NEURONTIN) 300 MG capsule Take 300 mg by mouth 3 (three) times daily as needed (severe pain).    [provider]  Ginger, Zingiber officinalis, (GINGER PO) Take 4 g by mouth daily.    [provider]  Magnesium 400 MG CAPS Take 400 mg by mouth 2 (two) times daily. asporotate  [provider]  melatonin 3 MG TABS tablet Take 3 mg by mouth at bedtime.    [provider]  MISC NATURAL PRODUCTS PO Take 1 capsule by mouth daily. Nettle  Root Extract    [provider]  Omega-3 Fatty Acids (FISH OIL) 1200 MG CAPS Take 1,200 mg by mouth daily.    [provider]  omeprazole (PRILOSEC) 40 MG capsule TAKE ONE CAPSULE BY MOUTH DAILY 06/27/21   Gatha Mayer, MD  OVER THE COUNTER MEDICATION Apply 1 application topically 2 (two) times daily. Topical CBD oil Lower back and left leg    [provider]  penicillin v potassium (VEETID) 500 MG tablet Take 500 mg by mouth 4 (four) times daily. 06/16/21   [provider]  polyethylene glycol powder (GLYCOLAX/MIRALAX) 17 GM/SCOOP powder Take 0.5 Containers by mouth daily as needed for moderate constipation or mild constipation.    [provider]  Probiotic Product (PROBIOTIC DAILY PO) Take 1 tablet by mouth 2 (two) times daily.    [provider]  senna-docusate (SENOKOT-S) 8.6-50 MG tablet Take 2 tablets by mouth at bedtime as needed for mild constipation.    [provider]  silodosin (RAPAFLO) 8 MG CAPS capsule Take 1 capsule (8 mg total) by mouth daily with breakfast.  06/12/21   McKenzie, Candee Furbish, MD  silver sulfADIAZINE (SILVADENE) 1 % cream Apply 1 application topically 2 (two) times daily. 06/22/21   Marrian Salvage, FNP  SOOLANTRA 1 % CREA Apply 1 application topically daily as needed (Rosacea). 02/06/18   [provider]  Tart Cherry 1200 MG CAPS Take 3,600 capsules by mouth daily.    [provider]  TURMERIC PO Take 750 mg by mouth daily. curamed curcumin    [provider]  Zinc Sulfate (ZINC 15 PO) Take 15 mg by mouth daily.    [provider]    Allergies    Patient has no known allergies.  Review of Systems   Review of Systems  Constitutional:  Negative for chills and fever.  Musculoskeletal:  Negative for myalgias.  Skin:  Positive for wound.  All other systems reviewed and are negative.  Physical Exam Updated Vital Signs BP (!) 147/85 (BP Location: Left Arm)    Pulse 68    Temp 97.7 F (36.5 C) (Oral)    Resp 18    Ht 6\' 2"  (1.88 m)    Wt 82 kg    SpO2 98%    BMI 23.21 kg/m   Physical Exam Vitals and nursing note reviewed.  Constitutional:      General: He is not in acute distress.    Appearance: He is not ill-appearing, toxic-appearing or diaphoretic.  HENT:     Head: Normocephalic.     Nose: Nose normal.     Mouth/Throat:     Mouth: Mucous membranes are moist.  Eyes:     Pupils: Pupils are equal, round, and reactive to light.  Cardiovascular:     Rate and Rhythm: Normal rate and regular rhythm.  Pulmonary:     Effort: Pulmonary effort is normal.     Breath sounds: Normal breath sounds. No wheezing.  Abdominal:     General: Abdomen is flat.     Palpations: Abdomen is soft.     Tenderness: There is no abdominal tenderness.  Musculoskeletal:     Cervical back: Normal range of motion.  Skin:    General: Skin is warm and dry.  Capillary Refill: Capillary refill takes less than 2 seconds.     Findings: Burn present.          Comments: Four centimeter eschared burn to left  buttock with surrounding erythema.  Neurological:     Mental Status: He is alert.    ED Results / Procedures / Treatments   Labs (all labs ordered are listed, but only abnormal results are displayed) Labs Reviewed - No data to display  EKG None  Radiology No results found.  Procedures Procedures   Medications Ordered in ED Medications - No data to display  ED Course  I have reviewed the triage vital signs and the nursing notes.  Pertinent labs & imaging results that were available during my care of the patient were reviewed by me and considered in my medical decision making (see chart for details).    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 Wound has eschar covering it, has no drainage noted on my exam, shows no signs of infection.   Patient is requesting referral to a burn center, and I discussed this with Dr. Roderic Palau who feels this is an appropriate referral for general surgery.  I will provide this patient with both referral to general surgery as well as the number for the burn center at [facility].  I have advised this patient if he feels so inclined he may contact the burn center at [facility] to try and establish an appointment.  This patient is nontoxic-appearing, denies fevers, chills, nausea, vomiting.  Denies all symptoms of systemic infection.  He is not tachycardic, his blood pressure on further evaluation is 117/90.  He is afebrile here.  He does not meet any criteria for sepsis.  I instructed this patient to continue to keep the wound covered and change dressings twice a day.  I encouraged him to utilize Neosporin ointment.  The patient has been given return precautions to include fevers or systemic signs of infection.  I have discussed with the patient how we feel a general surgeon would be more appropriate referral and he is in agreement.   I have provided the patient education regarding the black area to his wound and he expresses understanding.  This patient is stable on discharge  Final Clinical Impression(s) / ED Diagnoses Final diagnoses:  Burn of buttock, unspecified burn degree, initial encounter    Rx / DC Orders ED Discharge Orders     None        Lawana Chambers 07/08/21 1005    Milton Ferguson, MD 07/09/21 [phone]

## 2021-07-08 NOTE — ED Triage Notes (Addendum)
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 Pt saw PCP who recommended that he seek care at ED. Pt seeking referral to burn specialist.

## 2021-07-08 NOTE — Discharge Instructions (Addendum)
Return to ED with any new or worsening symptoms such as fevers, high heart rate, nausea, vomiting Follow up with General Surgery (number attached to this form) Continue to keep the area clean and dry Change dressings twice adily Utilize neosporin to cover the wound The number to the [facility] is [phone]

## 2021-07-11 ENCOUNTER — Other Ambulatory Visit: Payer: Self-pay | Admitting: Family

## 2021-07-12 ENCOUNTER — Telehealth: Payer: Self-pay

## 2021-07-12 ENCOUNTER — Encounter: Payer: Self-pay | Admitting: Dermatology

## 2021-07-12 ENCOUNTER — Other Ambulatory Visit: Payer: Self-pay

## 2021-07-12 ENCOUNTER — Ambulatory Visit: Payer: Medicare PPO | Admitting: Dermatology

## 2021-07-12 DIAGNOSIS — R202 Paresthesia of skin: Secondary | ICD-10-CM

## 2021-07-12 DIAGNOSIS — L111 Transient acantholytic dermatosis [Grover]: Secondary | ICD-10-CM

## 2021-07-12 DIAGNOSIS — L719 Rosacea, unspecified: Secondary | ICD-10-CM

## 2021-07-12 DIAGNOSIS — Z1283 Encounter for screening for malignant neoplasm of skin: Secondary | ICD-10-CM

## 2021-07-12 MED ORDER — IVERMECTIN 1 % EX CREA: 1.0000 "application " | TOPICAL_CREAM | Freq: Every day | CUTANEOUS | 3 refills | Status: DC

## 2021-07-12 MED ORDER — METRONIDAZOLE 0.75 % EX GEL: CUTANEOUS | 4 refills | Status: DC

## 2021-07-12 NOTE — Addendum Note (Signed)
Addended by: Sheran Lawless on: 07/12/2021 03:51 PM   Modules accepted: Orders

## 2021-07-12 NOTE — Patient Instructions (Signed)
YOU ARE TO PICK UP OVER THE COUNTER PRAMOXINE IF ITCHING OCCURS

## 2021-07-12 NOTE — Telephone Encounter (Signed)
See note

## 2021-07-13 ENCOUNTER — Encounter: Payer: Self-pay | Admitting: General Surgery

## 2021-07-13 ENCOUNTER — Ambulatory Visit: Payer: Medicare PPO | Admitting: General Surgery

## 2021-07-13 ENCOUNTER — Encounter: Payer: Self-pay | Admitting: Family

## 2021-07-13 VITALS — BP 138/83 | HR 73 | Temp 98.1°F | Resp 16 | Ht 74.0 in | Wt 190.0 lb

## 2021-07-13 DIAGNOSIS — T2125XA Burn of second degree of buttock, initial encounter: Secondary | ICD-10-CM

## 2021-07-13 NOTE — Progress Notes (Addendum)
Office Visit Note  Patient: Russell [name]             Date of Birth: Dec 02, 1945           MRN: [id number]             PCP: Marrian Salvage, FNP Referring: Marrian Salvage,* Visit Date: 07/27/2021 Occupation: @GUAROCC @  Subjective:  Lower back pain and joint stiffness   History of Present Illness: Russell [name] is a 75 y.o. male with a history of positive ANA and osteoarthritis.  He states that his Raynauds symptoms are mild and not very active currently.  He continues to have some stiffness in his hands and his knee joints.  He has been taking natural supplements which has been helpful.  He has noticed reduced discomfort in his joints.  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  He will be getting a stim unit for chronic pain.  He is also going to [facility] for second opinion on his lung cancer.  He states most likely he will undergo left lobectomy.  He is also under a lot of stress as his wife is going through the treatment for ovarian cancer.  Activities of Daily Living:  Patient reports morning stiffness for 1 hour.   Patient Denies nocturnal pain.  Difficulty dressing/grooming: Denies Difficulty climbing stairs: Reports Difficulty getting out of chair: Reports Difficulty using hands for taps, buttons, cutlery, and/or writing: Reports  Review of Systems  Constitutional:  Positive for fatigue.  HENT:  Positive for mouth dryness.   Eyes:  Negative for dryness.  Respiratory:  Positive for shortness of breath.   Cardiovascular:  Positive for swelling in legs/feet.  Gastrointestinal:  Positive for constipation and diarrhea.  Endocrine: Positive for cold intolerance.  Genitourinary:  Negative for difficulty urinating.  Musculoskeletal:  Positive for joint pain, gait problem, joint pain, muscle weakness and morning stiffness. Negative for joint swelling.  Skin:  Positive for sensitivity to sunlight. Negative for rash.  Allergic/Immunologic: Negative  for susceptible to infections.  Neurological:  Positive for numbness and weakness.  Hematological:  Negative for bruising/bleeding tendency and swollen glands.  Psychiatric/Behavioral:  Negative for sleep disturbance.    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 ([place]) 05/23/2021   Former smoker 05/05/2021   Lung nodule 04/21/2021   Elevated PSA 02/16/2021   Penile edema 10/05/2020   Erectile dysfunction due to arterial insufficiency 05/11/2020   Gross hematuria 05/11/2020   Benign prostatic hyperplasia with urinary obstruction 04/11/2020   Urinary retention 04/11/2020   DDD (degenerative disc disease), cervical 06/11/2018   DDD (degenerative disc disease), lumbar 06/11/2018   History of kidney stones 06/11/2018   Cerebellar infarct ([place]) 06/11/2018   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[place]) 06/11/2018   Primary osteoarthritis of left knee 06/11/2018   Primary osteoarthritis of both feet 06/11/2018   Plantar fasciitis 06/11/2018   Hx of colonic polyp 05/29/2018   Left lumbar radiculopathy 35/00/9381   Diastolic dysfunction 82/99/3716   Psoriasis 10/15/2016   DEGENERATIVE JOINT DISEASE 02/07/2009   HEADACHE 02/07/2009   GERD 10/02/2007   HYPERTENSION, BENIGN ESSENTIAL 10/08/2006   TESTOSTERONE DEFICIENCY 09/10/2006   ERECTILE DYSFUNCTION 09/10/2006   Prolapsed internal hemorrhoids, grade 2 and 3 09/10/2006   PLANTAR FASCIITIS, BILATERAL 09/10/2006   SLEEP APNEA 09/10/2006   MALAISE AND FATIGUE 09/10/2006    Past Medical History:  Diagnosis Date   Anemia  2019   Arachnoiditis    Arthritis    Bleeding internal hemorrhoids 09/10/2006   Qualifier: Diagnosis of  By: Jonna Munro MD, Cornelius     Cauda equina syndrome [facility] 2011   Cerebellar infarct [facility])    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   anterior cer disc and fusion with plates C3-4 and S5-6 at [facility]   Constipation    Enlarged prostate    GERD  (gastroesophageal reflux disease)    Hemorrhoids    History of kidney stones    passed stones   History of lumbar laminectomy 05/12/2020   open L2[facility]L5 at [facility]   Hx of colonic polyp 05/29/2018   05/2018 cecal ssp   Lung nodule 02/2021   Maxillary sinus polyp    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   Prolapsed internal hemorrhoids, grade 3 09/10/2006   Qualifier: Diagnosis of  By: Jonna Munro MD, Cornelius     Salivary gland enlargement    Sleep apnea 10 years ago   declined CPAP   Stroke ([place]) 15-20 years ago    Family History  Problem Relation Age of Onset   Aortic aneurysm Mother    Heart attack Father    Aortic aneurysm Sister    CVA Sister    Aortic aneurysm Sister    Aortic aneurysm Sister    Colon cancer Neg Hx    Pancreatic cancer Neg Hx    Rectal cancer Neg Hx    Stomach cancer Neg Hx    Past Surgical History:  Procedure Laterality Date   BRONCHIAL BIOPSY  05/16/2021   Procedure: BRONCHIAL BIOPSIES;  Surgeon: Garner Nash, DO;  Location: [place] ENDOSCOPY;  Service: Pulmonary;;   BRONCHIAL BRUSHINGS  05/16/2021   Procedure: BRONCHIAL BRUSHINGS;  Surgeon: Garner Nash, DO;  Location: [place] ENDOSCOPY;  Service: Pulmonary;;   BRONCHIAL NEEDLE ASPIRATION BIOPSY  05/16/2021   Procedure: BRONCHIAL NEEDLE ASPIRATION BIOPSIES;  Surgeon: Garner Nash, DO;  Location: [place] ENDOSCOPY;  Service: Pulmonary;;   CERVICAL SPINE SURGERY  04/10/2020   C3-C4 with pin at [facility]   COLONOSCOPY     FIDUCIAL MARKER PLACEMENT  05/16/2021   Procedure: FIDUCIAL MARKER PLACEMENT;  Surgeon: Garner Nash, DO;  Location: [place] ENDOSCOPY;  Service: Pulmonary;;   HEMORRHOID BANDING     LUMBAR LAMINECTOMY  03/08/2010   L2-3, cauda equina syndrome   lumbar laminectomy  05/12/2020   [facility]   UPPER GI ENDOSCOPY     VIDEO BRONCHOSCOPY WITH ENDOBRONCHIAL NAVIGATION Left 05/16/2021   Procedure: VIDEO BRONCHOSCOPY WITH ENDOBRONCHIAL NAVIGATION;  Surgeon: Garner Nash, DO;  [name];  Service:  Pulmonary;  Laterality: Left;  ION w/ fiducial placement   VIDEO BRONCHOSCOPY WITH RADIAL ENDOBRONCHIAL ULTRASOUND  05/16/2021   Procedure: VIDEO BRONCHOSCOPY WITH RADIAL ENDOBRONCHIAL ULTRASOUND;  Surgeon: Garner Nash, DO;  Location: [name];  Service: Pulmonary;;   wisdom teeth ext     Social History   Social History Narrative   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   Immunization History  Administered Date(s) Administered   Influenza Split 05/18/2014   Influenza Whole 04/28/2008   Influenza, High Dose Seasonal PF 03/31/2019, 04/08/2020   Influenza,inj,Quad PF,6+ Mos 05/01/2019   Influenza,inj,quad, With Preservative 05/13/2018   Influenza-Unspecified 05/18/2014,  05/13/2018   Moderna SARS-COV2 Booster Vaccination 06/01/2020   Moderna Sars-Covid-2 Vaccination 08/21/2019, 09/19/2019   Pneumococcal Conjugate-13 10/09/2013   Pneumococcal-Unspecified 10/09/2013   Td 04/28/2008   Tdap 04/28/2008, 06/22/2021   Zoster Recombinat (Shingrix) 12/21/2014   Zoster, Live 12/21/2014     Objective: Vital Signs: BP 132/75 (BP Location: Left Arm, Patient Position: Sitting, Cuff Size: Normal)   Pulse 63   Resp 15   Ht 6\' 2"  (1.88 m)   Wt 191 lb (86.6 kg)   BMI 24.52 kg/m    Physical Exam Vitals and nursing note reviewed.  Constitutional:      Appearance: He is well-developed.  HENT:     Head: Normocephalic and atraumatic.  Eyes:     Conjunctiva/sclera: Conjunctivae normal.     Pupils: Pupils are equal, round, and reactive to light.  Cardiovascular:     Rate and Rhythm: Normal rate and regular rhythm.     Heart sounds: Normal heart sounds.  Pulmonary:     Effort: Pulmonary effort is normal.     Breath sounds: Normal breath sounds.  Abdominal:     General: Bowel sounds are normal.      Palpations: Abdomen is soft.  Musculoskeletal:     Cervical back: Normal range of motion and neck supple.  Skin:    General: Skin is warm and dry.     Capillary Refill: Capillary refill takes less than 2 seconds.  Neurological:     Mental Status: He is alert and oriented to person, place, and time.  Psychiatric:        Behavior: Behavior normal.     Musculoskeletal Exam: He had limited range of motion of his cervical and lumbar spine.  He had discomfort with range of motion of lumbar spine.  Shoulder joints, elbow joints with good range of motion.  Wrist joints with good range of motion.  He had severe PIP and DIP thickening with incomplete fist formation and limited extension of PIP and DIP joints.  No synovitis was noted.  Hip joints and knee joints with good range of motion.  He had no tenderness over ankles or MTPs.  CDAI Exam: CDAI Score: -- Patient Global: --; Provider Global: -- Swollen: --; Tender: -- Joint Exam 07/27/2021   No joint exam has been documented for this visit   There is currently no information documented on the homunculus. Go to the Rheumatology activity and complete the homunculus joint exam.  Investigation: No additional findings.  Imaging: No results found.  Recent Labs: Lab Results  Component Value Date   WBC 5.0 05/16/2021   HGB 12.7 (L) 05/16/2021   PLT 201 05/16/2021   NA 137 05/16/2021   K 3.8 05/16/2021   CL 105 05/16/2021   CO2 25 05/16/2021   GLUCOSE 97 05/16/2021   BUN 18 05/16/2021   CREATININE 0.68 05/16/2021   BILITOT 0.6 09/28/2019   ALKPHOS 44 09/28/2019   AST 21 09/28/2019   ALT 16 09/28/2019   PROT 6.9 09/28/2019   ALBUMIN 4.2 09/28/2019   CALCIUM 8.9 05/16/2021   GFRAA >60 06/13/2018    Speciality Comments: No specialty comments available.  Procedures:  No procedures performed Allergies: Patient has no known allergies.   Assessment / Plan:     Visit Diagnoses: Positive ANA (antinuclear antibody) - he has positive  ANA.  ENA is completely negative.  He also gives history of Raynauds which is most likely related to history of smoking.  The Raynaud's phenomenon is currently not very  active.  He had good capillary refill with no nailbed capillary changes or telangiectasias.  Keeping core temperature warm and warm clothing was discussed.  Primary osteoarthritis of both hands - X-ray revealed second and third MCP joint narrowing and PIP and DIP severe narrowing.  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  He has been taking natural anti-inflammatories which are helpful.  I advised him to stop turmeric and ginger prior to the surgery due to blood thinning effect.  Primary osteoarthritis of left knee-he has popping sensation in his left knee joint.  Lower extremity muscle strengthening exercises were demonstrated in the office today.  Primary osteoarthritis of both feet-he has limited extension in his right foot due to back problems.  Ankle and calf muscle strengthening exercises were also demonstrated in the office today.  DDD (degenerative disc disease), cervical - he had seen Dr. Christella Noa in the past.  He had C-spine fusion in September 2021 by Dr. Redmond Pulling at [facility] [place].  DDD (degenerative disc disease), lumbar - he had lumbar laminectomy by Dr. Redmond Pulling at [facility] in October 2021.  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  He is waiting to get stim unit for pain relief.  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  Will be going to [facility] for second opinion.  Patient believes that he will be undergoing left lobectomy.  I reviewed the records from [place].  He has been coming up with the oncologist.  Former smoker - He smoked  1 pack/day for 9 years.  He quit smoking in 1976.  Sharen Hint has been under a  lot of stress due to his own health and also his wife's health.  His wife has ovarian cancer and is undergoing radiation therapy.  Daily exercise and meditation to relieve his stress was discussed.  HYPERTENSION, BENIGN ESSENTIAL  Diastolic dysfunction  NCGS (non-celiac gluten sensitivity)  Hx of colonic polyp  Cerebellar infarct (HCC)  History of kidney stones  Rosacea - he is followed by dermatology.  Orders: No orders of the defined types were placed in this encounter.  No orders of the defined types were placed in this encounter.   Face-to-face time spent with patient was 30 minutes. Greater than 50% of time was spent in counseling and coordination of care.  Follow-Up Instructions: Return in about 6 months (around 01/24/2022) for Osteoarthritis.   Bo Merino, MD  Note - This record has been created using Editor, commissioning.  Chart creation errors have been sought, but may not always  have been located. Such creation errors do not reflect on  the standard of medical care.
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
## 2021-07-18 DIAGNOSIS — R846 Abnormal cytological findings in specimens from respiratory organs and thorax: Secondary | ICD-10-CM | POA: Diagnosis not present

## 2021-07-18 DIAGNOSIS — C801 Malignant (primary) neoplasm, unspecified: Secondary | ICD-10-CM | POA: Diagnosis not present

## 2021-07-27 ENCOUNTER — Ambulatory Visit: Payer: Medicare PPO | Admitting: General Surgery

## 2021-07-27 ENCOUNTER — Ambulatory Visit: Payer: Medicare PPO | Admitting: Rheumatology

## 2021-07-27 ENCOUNTER — Other Ambulatory Visit: Payer: Self-pay

## 2021-07-27 ENCOUNTER — Encounter: Payer: Self-pay | Admitting: General Surgery

## 2021-07-27 ENCOUNTER — Encounter: Payer: Self-pay | Admitting: Rheumatology

## 2021-07-27 VITALS — BP 132/75 | HR 63 | Resp 15 | Ht 74.0 in | Wt 191.0 lb

## 2021-07-27 VITALS — BP 121/73 | HR 59 | Temp 98.2°F | Resp 14 | Ht 74.0 in | Wt 182.0 lb

## 2021-07-27 DIAGNOSIS — R768 Other specified abnormal immunological findings in serum: Secondary | ICD-10-CM | POA: Diagnosis not present

## 2021-07-27 DIAGNOSIS — M19072 Primary osteoarthritis, left ankle and foot: Secondary | ICD-10-CM

## 2021-07-27 DIAGNOSIS — Z87891 Personal history of nicotine dependence: Secondary | ICD-10-CM | POA: Diagnosis not present

## 2021-07-27 DIAGNOSIS — Z860100 Personal history of colon polyps, unspecified: Secondary | ICD-10-CM

## 2021-07-27 DIAGNOSIS — I10 Essential (primary) hypertension: Secondary | ICD-10-CM

## 2021-07-27 DIAGNOSIS — M19041 Primary osteoarthritis, right hand: Secondary | ICD-10-CM | POA: Diagnosis not present

## 2021-07-27 DIAGNOSIS — M19042 Primary osteoarthritis, left hand: Secondary | ICD-10-CM

## 2021-07-27 DIAGNOSIS — C3432 Malignant neoplasm of lower lobe, left bronchus or lung: Secondary | ICD-10-CM | POA: Diagnosis not present

## 2021-07-27 DIAGNOSIS — K9041 Non-celiac gluten sensitivity: Secondary | ICD-10-CM

## 2021-07-27 DIAGNOSIS — I5189 Other ill-defined heart diseases: Secondary | ICD-10-CM

## 2021-07-27 DIAGNOSIS — T2125XD Burn of second degree of buttock, subsequent encounter: Secondary | ICD-10-CM | POA: Diagnosis not present

## 2021-07-27 DIAGNOSIS — M19071 Primary osteoarthritis, right ankle and foot: Secondary | ICD-10-CM | POA: Diagnosis not present

## 2021-07-27 DIAGNOSIS — F439 Reaction to severe stress, unspecified: Secondary | ICD-10-CM

## 2021-07-27 DIAGNOSIS — I639 Cerebral infarction, unspecified: Secondary | ICD-10-CM

## 2021-07-27 DIAGNOSIS — M5030 Other cervical disc degeneration, unspecified cervical region: Secondary | ICD-10-CM | POA: Diagnosis not present

## 2021-07-27 DIAGNOSIS — M51369 Other intervertebral disc degeneration, lumbar region without mention of lumbar back pain or lower extremity pain: Secondary | ICD-10-CM

## 2021-07-27 DIAGNOSIS — M1712 Unilateral primary osteoarthritis, left knee: Secondary | ICD-10-CM | POA: Diagnosis not present

## 2021-07-27 DIAGNOSIS — M5136 Other intervertebral disc degeneration, lumbar region: Secondary | ICD-10-CM | POA: Diagnosis not present

## 2021-07-27 DIAGNOSIS — Z87442 Personal history of urinary calculi: Secondary | ICD-10-CM

## 2021-07-27 DIAGNOSIS — L719 Rosacea, unspecified: Secondary | ICD-10-CM
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
## 2021-08-04 ENCOUNTER — Ambulatory Visit: Payer: Medicare PPO | Admitting: Rheumatology

## 2021-08-04 DIAGNOSIS — Z981 Arthrodesis status: Secondary | ICD-10-CM | POA: Diagnosis not present

## 2021-08-04 DIAGNOSIS — S343XXD Injury of cauda equina, subsequent encounter: Secondary | ICD-10-CM | POA: Diagnosis not present

## 2021-08-04 DIAGNOSIS — I714 Abdominal aortic aneurysm, without rupture, unspecified: Secondary | ICD-10-CM | POA: Diagnosis not present

## 2021-08-04 DIAGNOSIS — C3432 Malignant neoplasm of lower lobe, left bronchus or lung: Secondary | ICD-10-CM | POA: Diagnosis not present

## 2021-08-04 DIAGNOSIS — I719 Aortic aneurysm of unspecified site, without rupture: Secondary | ICD-10-CM | POA: Diagnosis not present

## 2021-08-04 DIAGNOSIS — I10 Essential (primary) hypertension: Secondary | ICD-10-CM | POA: Diagnosis not present

## 2021-08-04 DIAGNOSIS — R001 Bradycardia, unspecified: Secondary | ICD-10-CM | POA: Diagnosis not present

## 2021-08-04 DIAGNOSIS — I639 Cerebral infarction, unspecified: Secondary | ICD-10-CM | POA: Diagnosis not present

## 2021-08-04 DIAGNOSIS — T2105XD Burn of unspecified degree of buttock, subsequent encounter: Secondary | ICD-10-CM | POA: Diagnosis not present

## 2021-08-04 DIAGNOSIS — Z01818 Encounter for other preprocedural examination: Secondary | ICD-10-CM | POA: Diagnosis not present

## 2021-08-04 DIAGNOSIS — R911 Solitary pulmonary nodule: Secondary | ICD-10-CM | POA: Diagnosis not present

## 2021-08-04 DIAGNOSIS — K2100 Gastro-esophageal reflux disease with esophagitis, without bleeding: Secondary | ICD-10-CM | POA: Diagnosis not present

## 2021-08-04 DIAGNOSIS — G4730 Sleep apnea, unspecified: Secondary | ICD-10-CM | POA: Diagnosis not present

## 2021-08-04 DIAGNOSIS — Z79899 Other long term (current) drug therapy: Secondary | ICD-10-CM | POA: Diagnosis not present

## 2021-08-07 ENCOUNTER — Telehealth: Payer: Self-pay | Admitting: *Deleted
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
## 2021-08-10 ENCOUNTER — Ambulatory Visit: Payer: Medicare PPO | Admitting: General Surgery

## 2021-08-11 ENCOUNTER — Encounter: Payer: Self-pay | Admitting: Pulmonary Disease

## 2021-08-11 NOTE — Telephone Encounter (Signed)
Please advise on Mychart. Thanks

## 2021-08-13 ENCOUNTER — Encounter: Payer: Self-pay | Admitting: Dermatology

## 2021-08-13 NOTE — Progress Notes (Signed)
° °  Follow-Up Visit   Subjective  Russell [name] is a 76 y.o. male who presents for the following: Annual Exam (No new concerns).  General skin check, discuss options for rosacea and Location:  Duration:  Quality:  Associated Signs/Symptoms: Modifying Factors:  Severity:  Timing: Context:   Objective  Well appearing patient in no apparent distress; mood and affect are within normal limits. Upper Body General skin examination, no atypical pigmented lesions or nonmelanoma skin cancer.  Torso - Posterior (Back) Monomorphic 3 mm slightly inflamed truncal papules   Right Upper Back Upper right back itch without visible growth or rash compatible with notalgia  Head - Anterior (Face) Central facial erythema with few inflammatory papules    A focused examination was performed including physical examination. Relevant physical exam findings are noted in the Assessment and Plan.   Assessment & Plan    Screening exam for skin cancer Upper Body  Annual skin examination.  Grover's disease Torso - Posterior (Back)  If affected areas do not itch, then leave it alone If it does begin to itch, Otc Pramoxine    Notalgia paresthetica Right Upper Back  OTC Pramoxine containing anti-itch lotion  Rosacea Head - Anterior (Face)  Soolantra or generic topical ivermectin if covered by health plan applied nightly for 6 weeks thencontact me with status report  Ivermectin (SOOLANTRA) 1 % CREA - Head - Anterior (Face) Apply 1 application topically daily.      I, Lavonna Monarch, MD, have reviewed all documentation for this visit.  The documentation on 08/13/21 for the exam, diagnosis, procedures, and orders are all accurate and complete.

## 2021-08-15 ENCOUNTER — Ambulatory Visit: Payer: Medicare PPO | Admitting: Internal Medicine

## 2021-08-15 ENCOUNTER — Other Ambulatory Visit: Payer: Self-pay | Admitting: Internal Medicine

## 2021-08-15 ENCOUNTER — Other Ambulatory Visit: Payer: Self-pay

## 2021-08-15 ENCOUNTER — Encounter: Payer: Self-pay | Admitting: Internal Medicine

## 2021-08-15 VITALS — BP 124/84 | HR 80 | Resp 18 | Ht 74.0 in | Wt 190.0 lb

## 2021-08-15 DIAGNOSIS — Z2821 Immunization not carried out because of patient refusal: Secondary | ICD-10-CM | POA: Diagnosis not present

## 2021-08-15 DIAGNOSIS — M5416 Radiculopathy, lumbar region: Secondary | ICD-10-CM | POA: Diagnosis not present

## 2021-08-15 DIAGNOSIS — G039 Meningitis, unspecified: Secondary | ICD-10-CM | POA: Diagnosis not present

## 2021-08-15 DIAGNOSIS — K219 Gastro-esophageal reflux disease without esophagitis: Secondary | ICD-10-CM | POA: Diagnosis not present

## 2021-08-15 DIAGNOSIS — N401 Enlarged prostate with lower urinary tract symptoms: Secondary | ICD-10-CM | POA: Diagnosis not present

## 2021-08-15 DIAGNOSIS — C3432 Malignant neoplasm of lower lobe, left bronchus or lung: Secondary | ICD-10-CM | POA: Diagnosis not present

## 2021-08-15 DIAGNOSIS — N138 Other obstructive and reflux uropathy: Secondary | ICD-10-CM | POA: Diagnosis not present

## 2021-08-15 NOTE — Patient Instructions (Signed)
Please continue to take medications as prescribed.  Please check with your Surgeon about PPSV23 and second dose of Shingrix vaccine.

## 2021-08-15 NOTE — Progress Notes (Signed)
New Patient Office Visit  Subjective:  Patient ID: Russell [name], male    DOB: 03-25-1946  Age: 76 y.o. MRN: [id number]  CC:  Chief Complaint  Patient presents with   New Patient (Initial Visit)    New patient just establishing care     HPI Russell [name] is a 76 y.o. male with past medical history of DDD of cervical and lumbar spine, cauda equina syndrome, GERD, lung cancer and BPH who presents for establishing care.  He sees Dr. Erin Fulling for history of left lower lobe lung CA and is planned to get resection of partial left lower lobe with Dr. Erie Noe at [facility].  He denies any chest pain, dyspnea or wheezing currently.  He has history of DDD of cervical and lumbar spine.  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  He sees Dr. Tamala Julian at [facility] spine and pain management clinic.  He also has a history of elevated PSA and sees Dr. Alyson Ingles for it.  He is on Rapaflo and finasteride for BPH.  He has history of GERD and takes omeprazole for it.  He is up to date with COVID-vaccine.  He has had 1 dose of pneumococcal and Shingrix vaccines each.  Past Medical History:  Diagnosis Date   Anemia 2019   Arachnoiditis    Arthritis    Bleeding internal hemorrhoids 09/10/2006   Qualifier: Diagnosis of  By: Jonna Munro MD, Cornelius     Cauda equina syndrome [facility]) 2011   Cerebellar infarct [facility])    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   anterior cer disc and fusion with plates C3-4 and H0-6 at [facility]   Constipation    Enlarged prostate    GERD (gastroesophageal reflux disease)    Hemorrhoids    History of kidney stones    passed stones   History of lumbar laminectomy 05/12/2020   open L2-L5 at [facility]   Hx of colonic polyp 05/29/2018   05/2018 cecal ssp   Lung nodule 02/2021   Maxillary sinus polyp    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   Prolapsed internal hemorrhoids, grade 3 09/10/2006   Qualifier: Diagnosis of  By: Jonna Munro  MD, Cornelius     Salivary gland enlargement    Sleep apnea 10 years ago   declined CPAP   Stroke ([place]) 15-20 years ago    Past Surgical History:  Procedure Laterality Date   BRONCHIAL BIOPSY  05/16/2021   Procedure: BRONCHIAL BIOPSIES;  Surgeon: Garner Nash, DO;  Location: [place];  Service: Pulmonary;;   BRONCHIAL BRUSHINGS  05/16/2021   Procedure: BRONCHIAL BRUSHINGS;  Surgeon: Garner Nash, DO;  Location: [place];  Service: Pulmonary;;   BRONCHIAL NEEDLE ASPIRATION BIOPSY  05/16/2021   Procedure: BRONCHIAL NEEDLE ASPIRATION BIOPSIES;  Surgeon: Garner Nash, DO;  Location: [place] [facility];  Service: Pulmonary;;   CERVICAL SPINE SURGERY  04/10/2020   C3-C4 with pin at [facility]   COLONOSCOPY     FIDUCIAL MARKER PLACEMENT  05/16/2021   Procedure: FIDUCIAL MARKER PLACEMENT;  Surgeon: Garner Nash, DO;  Location: [place] [facility];  Service: Pulmonary;;   HEMORRHOID BANDING     LUMBAR LAMINECTOMY  03/08/2010   L2-3, cauda equina syndrome   lumbar laminectomy  05/12/2020   [facility]   UPPER GI ENDOSCOPY     VIDEO BRONCHOSCOPY WITH ENDOBRONCHIAL NAVIGATION Left 05/16/2021   Procedure: VIDEO BRONCHOSCOPY WITH ENDOBRONCHIAL NAVIGATION;  Surgeon: Garner Nash, DO;  Location: [name];  Service: Pulmonary;  Laterality: Left;  ION w/ fiducial placement   VIDEO BRONCHOSCOPY WITH RADIAL ENDOBRONCHIAL ULTRASOUND  05/16/2021   Procedure: VIDEO BRONCHOSCOPY WITH RADIAL ENDOBRONCHIAL ULTRASOUND;  Surgeon: Garner Nash, DO;  Location: [name];  Service: Pulmonary;;   wisdom teeth ext      Family History  Problem Relation Age of Onset   Aortic aneurysm Mother    Heart attack Father    Aortic aneurysm Sister    CVA Sister    Aortic aneurysm Sister    Aortic aneurysm Sister    Colon cancer Neg Hx    Pancreatic cancer Neg Hx    Rectal cancer Neg Hx    Stomach cancer Neg Hx     Social History   Socioeconomic History   Marital status: Married    Spouse name: Not on  file   Number of children: Not on file   Years of education: Not on file   Highest education level: Not on file  Occupational History   Occupation: retired  Tobacco Use   Smoking status: Former    Packs/day: 0.25    Years: 9.00    Pack years: 2.25    Types: Cigarettes    Start date: 10/15/1965    Quit date: 10/16/1974    Years since quitting: 46.8   Smokeless tobacco: Never  Vaping Use   Vaping Use: Never used  Substance and Sexual Activity   Alcohol use: Not Currently    Comment: rarely wine   Drug use: No   Sexual activity: Yes  Other Topics Concern   Not on file  Social History Narrative   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   Social Determinants of Health   Financial Resource Strain: Not on file  Food Insecurity: Not on file  Transportation Needs: Not on file  Physical Activity: Not on file  Stress: Not on file  Social Connections: Not on file  Intimate Partner Violence: Not on file    ROS Review of Systems  Constitutional:  Negative for chills and fever.  HENT:  Negative for congestion and sore throat.   Eyes:  Negative for pain and discharge.  Respiratory:  Negative for cough and shortness of breath.   Cardiovascular:  Negative for chest pain and palpitations.  Gastrointestinal:  Negative for diarrhea, nausea and vomiting.  Endocrine: Negative for polydipsia and polyuria.  Genitourinary:  Negative for dysuria and hematuria.  Musculoskeletal:  Positive for back pain and neck pain. Negative for neck stiffness.  Skin:  Negative for rash.  Neurological:  Positive for weakness and numbness. Negative for headaches.  Psychiatric/Behavioral:  Negative for agitation and behavioral problems.    Objective:   Today's Vitals: BP 124/84 (BP Location: Left Arm, Patient Position: Sitting,  Cuff Size: Normal)    Pulse 80    Resp 18    Ht 6\' 2"  (1.88 m)    Wt 190 lb (86.2 kg)    SpO2 100%    BMI 24.39 kg/m   Physical Exam Vitals reviewed.  Constitutional:      General: He is not in acute distress.    Appearance: He is not diaphoretic.     Comments: Has a cane for support  HENT:  Head: Normocephalic and atraumatic.     Nose: Nose normal.     Mouth/Throat:     Mouth: Mucous membranes are moist.  Eyes:     General: No scleral icterus.    Extraocular Movements: Extraocular movements intact.     Pupils: Pupils are equal, round, and reactive to light.  Cardiovascular:     Rate and Rhythm: Normal rate and regular rhythm.     Pulses: Normal pulses.     Heart sounds: Normal heart sounds. No murmur heard. Pulmonary:     Breath sounds: Normal breath sounds. No wheezing or rales.  Musculoskeletal:     Cervical back: Neck supple. No tenderness.     Right lower leg: No edema.     Left lower leg: No edema.  Skin:    General: Skin is warm.     Findings: No rash.  Neurological:     General: No focal deficit present.     Mental Status: He is alert and oriented to person, place, and time.  Psychiatric:        Mood and Affect: Mood normal.        Behavior: Behavior normal.    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 Alyson Ingles      Other Visit Diagnoses     Influenza vaccine refused           Outpatient Encounter Medications as of 08/15/2021  Medication Sig   aspirin 81 MG EC tablet Take 81 mg by mouth every evening.   clotrimazole-betamethasone (LOTRISONE) cream Apply 1 application topically 2 (two) times daily.   finasteride (PROSCAR) 5 MG tablet Take 1 tablet (5 mg total) by mouth daily.   gabapentin (NEURONTIN) 300 MG capsule Take 300 mg by mouth 3 (three) times daily as needed (severe pain).   Ginger, Zingiber officinalis, (GINGER PO) Take 4 g by mouth daily.   Ivermectin (SOOLANTRA) 1 % CREA Apply 1 application topically daily.   Magnesium 400 MG CAPS Take 400 mg by mouth 2 (two) times daily. asporotate   melatonin 3 MG TABS tablet Take 3 mg by mouth at bedtime.   metroNIDAZOLE (METROGEL) 0.75 % gel Apply to face daily for rosacea   MISC NATURAL PRODUCTS PO Take 1 capsule by mouth daily. Nettle  Root Extract   Omega-3 Fatty Acids (FISH OIL) 1200 MG CAPS Take 1,200 mg by mouth daily.   OVER THE COUNTER MEDICATION Apply 1 application topically 2 (two) times daily. Topical CBD oil Lower back and left leg   polyethylene glycol powder (GLYCOLAX/MIRALAX) 17 GM/SCOOP powder Take 0.5 Containers by mouth daily as needed for moderate  constipation or mild constipation.   Probiotic Product (PROBIOTIC DAILY PO) Take 1 tablet by mouth 2 (two) times daily.   senna-docusate (SENOKOT-S) 8.6-50 MG tablet Take 2 tablets by mouth at bedtime as needed for mild constipation.   silodosin (RAPAFLO) 8 MG CAPS capsule Take 1 capsule (8 mg total) by mouth daily with breakfast.   silver sulfADIAZINE (SILVADENE) 1 % cream Apply 1 application topically 2 (two) times daily.   Tart Cherry 1200 MG CAPS Take 3,600 capsules by mouth daily.   TURMERIC PO Take 750 mg by mouth daily. curamed curcumin   Zinc Sulfate (ZINC 15 PO) Take 15 mg by  mouth daily.   [DISCONTINUED] omeprazole (PRILOSEC) 40 MG capsule TAKE ONE CAPSULE BY MOUTH DAILY   No facility-administered encounter medications on file as of 08/15/2021.    Follow-up: Return in about 6 months (around 02/12/2022) for F/u after surgery and low back pain.   [name], MD

## 2021-08-16 NOTE — Assessment & Plan Note (Signed)
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

## 2021-08-16 NOTE — Assessment & Plan Note (Signed)
Has had lumbar and cervical spine surgery Currently followed by Dr. Tamala Julian at [facility] spine and pain management clinic Planning to get spinal stimulator Has had PT

## 2021-08-16 NOTE — Assessment & Plan Note (Signed)
Former smoker Followed by Pulmonology and Oncology Plan to have lobectomy at [facility]

## 2021-08-16 NOTE — Assessment & Plan Note (Signed)
Well controlled with omeprazole. 

## 2021-08-16 NOTE — Assessment & Plan Note (Signed)
Takes Rapaflo and finasteride Followed by Dr. Alyson Ingles

## 2021-08-21 DIAGNOSIS — R911 Solitary pulmonary nodule: Secondary | ICD-10-CM | POA: Diagnosis not present

## 2021-08-21 DIAGNOSIS — R0609 Other forms of dyspnea: Secondary | ICD-10-CM | POA: Diagnosis not present

## 2021-08-22 DIAGNOSIS — I351 Nonrheumatic aortic (valve) insufficiency: Secondary | ICD-10-CM | POA: Diagnosis not present

## 2021-08-22 DIAGNOSIS — R0609 Other forms of dyspnea: Secondary | ICD-10-CM | POA: Diagnosis not present

## 2021-08-30 ENCOUNTER — Ambulatory Visit (HOSPITAL_COMMUNITY): Payer: Medicare PPO

## 2021-09-01 DIAGNOSIS — I719 Aortic aneurysm of unspecified site, without rupture: Secondary | ICD-10-CM | POA: Insufficient documentation

## 2021-09-01 DIAGNOSIS — Z01818 Encounter for other preprocedural examination: Secondary | ICD-10-CM | POA: Insufficient documentation

## 2021-09-03 DIAGNOSIS — C3432 Malignant neoplasm of lower lobe, left bronchus or lung: Secondary | ICD-10-CM | POA: Diagnosis not present

## 2021-09-03 DIAGNOSIS — D649 Anemia, unspecified: Secondary | ICD-10-CM | POA: Diagnosis not present

## 2021-09-03 DIAGNOSIS — I10 Essential (primary) hypertension: Secondary | ICD-10-CM | POA: Diagnosis not present

## 2021-09-03 DIAGNOSIS — I7122 Aneurysm of the aortic arch, without rupture: Secondary | ICD-10-CM | POA: Diagnosis not present

## 2021-09-03 DIAGNOSIS — J939 Pneumothorax, unspecified: Secondary | ICD-10-CM | POA: Diagnosis not present

## 2021-09-03 DIAGNOSIS — Z4682 Encounter for fitting and adjustment of non-vascular catheter: Secondary | ICD-10-CM | POA: Diagnosis not present

## 2021-09-03 DIAGNOSIS — R54 Age-related physical debility: Secondary | ICD-10-CM | POA: Diagnosis not present

## 2021-09-03 DIAGNOSIS — K219 Gastro-esophageal reflux disease without esophagitis: Secondary | ICD-10-CM | POA: Diagnosis not present

## 2021-09-03 DIAGNOSIS — J8410 Pulmonary fibrosis, unspecified: Secondary | ICD-10-CM | POA: Diagnosis not present

## 2021-09-03 DIAGNOSIS — M1990 Unspecified osteoarthritis, unspecified site: Secondary | ICD-10-CM | POA: Diagnosis not present

## 2021-09-03 DIAGNOSIS — J984 Other disorders of lung: Secondary | ICD-10-CM | POA: Diagnosis not present

## 2021-09-03 DIAGNOSIS — G8929 Other chronic pain: Secondary | ICD-10-CM | POA: Diagnosis not present

## 2021-09-03 DIAGNOSIS — R911 Solitary pulmonary nodule: Secondary | ICD-10-CM | POA: Diagnosis not present

## 2021-09-03 DIAGNOSIS — J479 Bronchiectasis, uncomplicated: Secondary | ICD-10-CM | POA: Diagnosis not present

## 2021-09-03 DIAGNOSIS — J849 Interstitial pulmonary disease, unspecified: Secondary | ICD-10-CM | POA: Diagnosis not present

## 2021-09-03 DIAGNOSIS — N138 Other obstructive and reflux uropathy: Secondary | ICD-10-CM | POA: Diagnosis not present

## 2021-09-05 ENCOUNTER — Other Ambulatory Visit: Payer: Medicare PPO

## 2021-09-12 ENCOUNTER — Ambulatory Visit: Payer: Medicare PPO | Admitting: Urology

## 2021-09-18 DIAGNOSIS — M47816 Spondylosis without myelopathy or radiculopathy, lumbar region: Secondary | ICD-10-CM | POA: Diagnosis not present

## 2021-09-22 DIAGNOSIS — J90 Pleural effusion, not elsewhere classified: Secondary | ICD-10-CM | POA: Diagnosis not present

## 2021-09-22 DIAGNOSIS — Z08 Encounter for follow-up examination after completed treatment for malignant neoplasm: Secondary | ICD-10-CM | POA: Diagnosis not present

## 2021-09-22 DIAGNOSIS — Z87891 Personal history of nicotine dependence: Secondary | ICD-10-CM | POA: Diagnosis not present

## 2021-09-22 DIAGNOSIS — Z85118 Personal history of other malignant neoplasm of bronchus and lung: Secondary | ICD-10-CM | POA: Diagnosis not present

## 2021-09-22 DIAGNOSIS — R911 Solitary pulmonary nodule: Secondary | ICD-10-CM | POA: Diagnosis not present

## 2021-09-22 DIAGNOSIS — Z902 Acquired absence of lung [part of]: Secondary | ICD-10-CM | POA: Diagnosis not present

## 2021-09-22 DIAGNOSIS — J9811 Atelectasis: Secondary | ICD-10-CM | POA: Diagnosis not present

## 2021-10-02 ENCOUNTER — Other Ambulatory Visit: Payer: Self-pay

## 2021-10-02 ENCOUNTER — Other Ambulatory Visit: Payer: Medicare PPO

## 2021-10-02 DIAGNOSIS — R972 Elevated prostate specific antigen [PSA]: Secondary | ICD-10-CM

## 2021-10-03 LAB — PSA: Prostate Specific Ag, Serum: 1.6 ng/mL (ref 0.0–4.0)

## 2021-10-09 ENCOUNTER — Ambulatory Visit: Payer: Medicare PPO | Admitting: Urology

## 2021-10-09 DIAGNOSIS — M47816 Spondylosis without myelopathy or radiculopathy, lumbar region: Secondary | ICD-10-CM | POA: Diagnosis not present

## 2021-10-17 ENCOUNTER — Encounter: Payer: Self-pay | Admitting: Physician Assistant

## 2021-10-18 ENCOUNTER — Other Ambulatory Visit (HOSPITAL_COMMUNITY): Payer: Self-pay | Admitting: Physician Assistant

## 2021-10-18 ENCOUNTER — Other Ambulatory Visit: Payer: Self-pay | Admitting: Pain Medicine

## 2021-10-18 ENCOUNTER — Other Ambulatory Visit: Payer: Self-pay | Admitting: Physician Assistant

## 2021-10-18 DIAGNOSIS — M47816 Spondylosis without myelopathy or radiculopathy, lumbar region: Secondary | ICD-10-CM

## 2021-10-19 ENCOUNTER — Ambulatory Visit (HOSPITAL_COMMUNITY)
Admission: RE | Admit: 2021-10-19 | Discharge: 2021-10-19 | Disposition: A | Discharge status: Home / Self Care | Payer: Medicare PPO | Source: Ambulatory Visit | Attending: Pain Medicine | Admitting: Pain Medicine

## 2021-10-19 DIAGNOSIS — M5127 Other intervertebral disc displacement, lumbosacral region: Secondary | ICD-10-CM | POA: Diagnosis not present

## 2021-10-19 DIAGNOSIS — M47816 Spondylosis without myelopathy or radiculopathy, lumbar region: Secondary | ICD-10-CM | POA: Insufficient documentation

## 2021-10-19 DIAGNOSIS — M48061 Spinal stenosis, lumbar region without neurogenic claudication: Secondary | ICD-10-CM | POA: Diagnosis not present

## 2021-11-06 ENCOUNTER — Encounter: Payer: Self-pay | Admitting: Physician Assistant

## 2021-11-06 ENCOUNTER — Ambulatory Visit: Payer: Medicare PPO | Admitting: Physician Assistant

## 2021-11-06 VITALS — BP 128/84 | HR 53 | Ht 74.0 in | Wt 182.4 lb

## 2021-11-06 DIAGNOSIS — K625 Hemorrhage of anus and rectum: Secondary | ICD-10-CM | POA: Diagnosis not present

## 2021-11-06 DIAGNOSIS — K649 Unspecified hemorrhoids: Secondary | ICD-10-CM

## 2021-11-06 MED ORDER — HYDROCORTISONE (PERIANAL) 2.5 % EX CREA: 1.0000 "application " | TOPICAL_CREAM | Freq: Two times a day (BID) | CUTANEOUS | 1 refills | Status: DC

## 2021-11-06 NOTE — Progress Notes (Signed)
? ?Chief Complaint: Hemorrhoids ? ?HPI: ?   [name] is a 76 year old male with a past medical history as listed below including lung cancer diagnosed in 2022 status post left lower lobe lobectomy in February of this year, constipation and reflux, known to Dr. Carlean Purl, who was referred to me by [name], MD for a complaint of hemorrhoids. ?   05/16/2018 colonoscopy and EGD.  Colonoscopy with 1 diminutive polyp in the cecum and internal hemorrhoids.  Pathology showed 1 sessile serrated polyp and recall placed for 5 years.  At that time Dr. Carlean Purl made note of considering hemorrhoid banding in office.  EGD with LA grade a reflux esophagitis, multiple gastric polyps, gastritis and duodenitis.  Biopsies showed reflux and gastritis.  Patient continued on Omeprazole daily. ?   December 2019 patient underwent banding. ?   Today, the patient presents to clinic and tells me that he got diagnosed with lung cancer back in November of last year and underwent a lobectomy in February.  He was also diagnosed with Arachnoiditis about 9 months ago and since then has had trouble with constipation off and on for which he uses MiraLAX/Dulcolax as needed.  He is able to keep his stools fairly consistent, but tells me that ever since he had this trouble his hemorrhoids seem to flareup.  Describes that he had banding done back in 2019 and had no issues after that until just now.  He tells me symptoms when he wipes he sees a lot of high heavy bleeding and other times it is just a little or none at all.  He wonders if there is anything else to be done at this point.  He does use RectiCare cream occasionally on the "external one", which makes it feel better and currently this one "is not bothering me", he thinks it has more to do with his internal hemorrhoids. ?   Denies fever, chills or abdominal pain. ? ?Past Medical History:  ?Diagnosis Date  ? Anemia 2019  ? Arachnoiditis   ? Arthritis   ? Bleeding internal hemorrhoids  09/10/2006  ? Qualifier: Diagnosis of  By: Jonna Munro MD, Roderic Scarce    ? 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[place]) 2011  ? Cerebellar infarct ([place])   ? 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  ? anterior cer disc and fusion with plates C3-4 and S2-8 at [facility]  ? Constipation   ? Enlarged prostate   ? GERD (gastroesophageal reflux disease)   ? Hemorrhoids   ? History of kidney stones   ? passed stones  ? History of lumbar laminectomy 05/12/2020  ? open L2-L5 at [facility]  ? Hx of colonic polyp 05/29/2018  ? 05/2018 cecal ssp  ? Lung nodule 02/2021  ? Maxillary sinus polyp   ? Memory loss   ? related to age  ? Prolapsed internal hemorrhoids, grade 3 09/10/2006  ? Qualifier: Diagnosis of  By: Jonna Munro MD, Roderic Scarce    ? Salivary gland enlargement   ? Sleep apnea 10 years ago  ? declined CPAP  ? Stroke ([place]) 15-20 years ago  ? ? ?Past Surgical History:  ?Procedure Laterality Date  ? BRONCHIAL BIOPSY  05/16/2021  ? Procedure: BRONCHIAL BIOPSIES;  Surgeon: Garner Nash, DO;  Location: [place] ENDOSCOPY;  Service: Pulmonary;;  ? BRONCHIAL BRUSHINGS  05/16/2021  ? Procedure: BRONCHIAL BRUSHINGS;  Surgeon: Garner Nash, DO;  Location: [place] ENDOSCOPY;  Service: Pulmonary;;  ? BRONCHIAL NEEDLE ASPIRATION BIOPSY  05/16/2021  ? Procedure: BRONCHIAL NEEDLE ASPIRATION BIOPSIES;  Surgeon: Valeta Harms,  Octavio Graves, DO;  Location: [place] [facility]OSCOPY;  Service: Pulmonary;;  ? CERVICAL SPINE SURGERY  04/10/2020  ? C3-C4 with pin at [facility]  ? COLONOSCOPY    ? FIDUCIAL MARKER PLACEMENT  05/16/2021  ? Procedure: FIDUCIAL MARKER PLACEMENT;  Surgeon: Garner Nash, DO;  Location: [place] ENDOSCOPY;  Service: Pulmonary;;  ? HEMORRHOID BANDING    ? LUMBAR LAMINECTOMY  03/08/2010  ? L2-3, cauda equina syndrome  ? lumbar laminectomy  05/12/2020  ? [facility]  ? UPPER GI ENDOSCOPY    ? VIDEO BRONCHOSCOPY WITH ENDOBRONCHIAL NAVIGATION Left 05/16/2021  ? Procedure: VIDEO BRONCHOSCOPY WITH ENDOBRONCHIAL NAVIGATION;  Surgeon: Garner Nash, DO;  Location: [place];  Service: Pulmonary;   Laterality: Left;  ION w/ fiducial placement  ? VIDEO BRONCHOSCOPY WITH RADIAL ENDOBRONCHIAL ULTRASOUND  05/16/2021  ? Procedure: VIDEO BRONCHOSCOPY WITH RADIAL ENDOBRONCHIAL ULTRASOUND;  Surgeon: Garner Nash, DO;  Location: [place] ENDOSCOPY;  Service: Pulmonary;;  ? wisdom teeth ext    ? ? ?Current Outpatient Medications  ?Medication Sig Dispense Refill  ? aspirin 81 MG EC tablet Take 81 mg by mouth every evening.    ? clotrimazole-betamethasone (LOTRISONE) cream Apply 1 application topically 2 (two) times daily. 30 g 0  ? finasteride (PROSCAR) 5 MG tablet Take 1 tablet (5 mg total) by mouth daily. 90 tablet 3  ? gabapentin (NEURONTIN) 300 MG capsule Take 300 mg by mouth 3 (three) times daily as needed (severe pain).    ? Ginger, Zingiber officinalis, (GINGER PO) Take 4 g by mouth daily.    ? Ivermectin (SOOLANTRA) 1 % CREA Apply 1 application topically daily. 30 g 3  ? Magnesium 400 MG CAPS Take 400 mg by mouth 2 (two) times daily. asporotate    ? melatonin 3 MG TABS tablet Take 3 mg by mouth at bedtime.    ? metroNIDAZOLE (METROGEL) 0.75 % gel Apply to face daily for rosacea 45 g 4  ? MISC NATURAL PRODUCTS PO Take 1 capsule by mouth daily. Nettle  Root Extract    ? Omega-3 Fatty Acids (FISH OIL) 1200 MG CAPS Take 1,200 mg by mouth daily.    ? omeprazole (PRILOSEC) 40 MG capsule TAKE (1) CAPSULE BY MOUTH ONCE DAILY. 90 capsule 0  ? OVER THE COUNTER MEDICATION Apply 1 application topically 2 (two) times daily. Topical CBD oil ?Lower back and left leg    ? polyethylene glycol powder (GLYCOLAX/MIRALAX) 17 GM/SCOOP powder Take 0.5 Containers by mouth daily as needed for moderate constipation or mild constipation.    ? Probiotic Product (PROBIOTIC DAILY PO) Take 1 tablet by mouth 2 (two) times daily.    ? senna-docusate (SENOKOT-S) 8.6-50 MG tablet Take 2 tablets by mouth at bedtime as needed for mild constipation.    ? silodosin (RAPAFLO) 8 MG CAPS capsule Take 1 capsule (8 mg total) by mouth daily with breakfast. 30  capsule 11  ? silver sulfADIAZINE (SILVADENE) 1 % cream Apply 1 application topically 2 (two) times daily. 50 g 0  ? Tart Cherry 1200 MG CAPS Take 3,600 capsules by mouth daily.    ? TURMERIC PO Take 750 mg by mouth daily. curamed curcumin    ? Zinc Sulfate (ZINC 15 PO) Take 15 mg by mouth daily.    ? ?No current facility-administered medications for this visit.  ? ? ?Allergies as of 11/06/2021  ? (No Known Allergies)  ? ? ?Family History  ?Problem Relation Age of Onset  ? Aortic aneurysm Mother   ? Heart attack  Father   ? Aortic aneurysm Sister   ? CVA Sister   ? Aortic aneurysm Sister   ? Aortic aneurysm Sister   ? Colon cancer Neg Hx   ? Pancreatic cancer Neg Hx   ? Rectal cancer Neg Hx   ? Stomach cancer Neg Hx   ? ? ?Social History  ? ?Socioeconomic History  ? Marital status: Married  ?  Spouse name: Not on file  ? Number of children: Not on file  ? Years of education: Not on file  ? Highest education level: Not on file  ?Occupational History  ? Occupation: retired  ?Tobacco Use  ? Smoking status: Former  ?  Packs/day: 0.25  ?  Years: 9.00  ?  Pack years: 2.25  ?  Types: Cigarettes  ?  Start date: 10/15/1965  ?  Quit date: 10/16/1974  ?  Years since quitting: 47.0  ? Smokeless tobacco: Never  ?Vaping Use  ? Vaping Use: Never used  ?Substance and Sexual Activity  ? Alcohol use: Not Currently  ?  Comment: rarely wine  ? Drug use: No  ? Sexual activity: Yes  ?Other Topics Concern  ? Not on file  ?Social History Narrative  ? 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 No children, lives with his wife  ? 3 to 4 cups of 50-50 caffeinated coffee a day  ? No alcohol or tobacco or drug use there is former cigarette use.  ? ?Social Determinants of Health  ? ?Financial Resource Strain: Not on file  ?Food Insecurity: Not on file  ?Transportation Needs: Not on file  ?Physical Activity: Not on file  ?Stress: Not on file  ?Social Connections: Not on file  ?Intimate Partner Violence: Not on file   ? ? ?Review of Systems:    ?Constitutional: No weight loss, fever or chills ?Skin: No rash  ?Cardiovascular: No chest pain ?Respiratory: No SOB  ?Gastrointestinal: See HPI and otherwise negative ?Genitourinary: No dysuria

## 2021-11-06 NOTE — Patient Instructions (Signed)
We have sent the following medications to your pharmacy for you to pick up at your convenience: ?Hydrocortisone 2.5 % cream - apply externally and cover a Preparation H suppository two times daily for 7-14 days. ? ?If you are age 74 or older, your body mass index should be between 23-30. Your Body mass index is 23.42 kg/m?Marland Kitchen If this is out of the aforementioned range listed, please consider follow up with your Primary Care Provider. ? ?If you are age 57 or younger, your body mass index should be between 19-25. Your Body mass index is 23.42 kg/m?Marland Kitchen If this is out of the aformentioned range listed, please consider follow up with your Primary Care Provider.  ? ?________________________________________________________ ? ?The [place] [facility] [facility] providers would like to encourage you to use [facility] to communicate with providers for non-urgent requests or questions.  Due to long hold times on the telephone, sending your provider a message by [facility] may be a faster and more efficient way to get a response.  Please allow 48 business hours for a response.  Please remember that this is for non-urgent requests.  ?_______________________________________________________ ? ? ?

## 2021-11-07 DIAGNOSIS — Z09 Encounter for follow-up examination after completed treatment for conditions other than malignant neoplasm: Secondary | ICD-10-CM | POA: Insufficient documentation

## 2021-11-22 DIAGNOSIS — M47816 Spondylosis without myelopathy or radiculopathy, lumbar region: Secondary | ICD-10-CM | POA: Diagnosis not present

## 2021-11-22 DIAGNOSIS — M4316 Spondylolisthesis, lumbar region: Secondary | ICD-10-CM | POA: Diagnosis not present

## 2021-11-22 DIAGNOSIS — M5136 Other intervertebral disc degeneration, lumbar region: Secondary | ICD-10-CM | POA: Diagnosis not present

## 2021-11-22 DIAGNOSIS — G039 Meningitis, unspecified: Secondary | ICD-10-CM | POA: Diagnosis not present

## 2021-11-22 DIAGNOSIS — Z9889 Other specified postprocedural states: Secondary | ICD-10-CM | POA: Diagnosis not present

## 2021-11-22 DIAGNOSIS — M48061 Spinal stenosis, lumbar region without neurogenic claudication: Secondary | ICD-10-CM | POA: Diagnosis not present

## 2021-12-04 DIAGNOSIS — M5442 Lumbago with sciatica, left side: Secondary | ICD-10-CM | POA: Diagnosis not present

## 2021-12-04 DIAGNOSIS — M9901 Segmental and somatic dysfunction of cervical region: Secondary | ICD-10-CM | POA: Diagnosis not present

## 2021-12-04 DIAGNOSIS — M546 Pain in thoracic spine: Secondary | ICD-10-CM | POA: Diagnosis not present

## 2021-12-04 DIAGNOSIS — M9902 Segmental and somatic dysfunction of thoracic region: Secondary | ICD-10-CM | POA: Diagnosis not present

## 2021-12-04 DIAGNOSIS — M9905 Segmental and somatic dysfunction of pelvic region: Secondary | ICD-10-CM | POA: Diagnosis not present

## 2021-12-04 DIAGNOSIS — M542 Cervicalgia: Secondary | ICD-10-CM | POA: Diagnosis not present

## 2021-12-05 ENCOUNTER — Telehealth: Payer: Self-pay | Admitting: Internal Medicine

## 2021-12-05 MED ORDER — OMEPRAZOLE 40 MG PO CPDR: DELAYED_RELEASE_CAPSULE | ORAL | 0 refills | Status: DC

## 2021-12-05 NOTE — Telephone Encounter (Signed)
Patient called requesting a refill for omeprazole medication. Please advise.

## 2021-12-05 NOTE — Telephone Encounter (Signed)
Rx sent 

## 2021-12-06 ENCOUNTER — Other Ambulatory Visit: Payer: Self-pay | Admitting: Internal Medicine

## 2021-12-06 MED ORDER — OMEPRAZOLE 40 MG PO CPDR: DELAYED_RELEASE_CAPSULE | ORAL | 0 refills | Status: DC

## 2021-12-06 NOTE — Addendum Note (Signed)
Addended by: Aleatha Borer on: 12/06/2021 03:42 PM   Modules accepted: Orders

## 2021-12-06 NOTE — Telephone Encounter (Signed)
I called [facility] and the pharmacist is going to call [facility] and check to see if they filled it yesterday, if so she is going to get them to cancel it out so it can be filled at [facility]. I called and told Russell [name] all of this and per his request I removed [facility] from his pharmacy list.

## 2021-12-06 NOTE — Telephone Encounter (Signed)
Patient calling back in regards to Omeprazole medication refill. Patient states pharmacy still has not received the prescription and will not refill until prescription is received. Patient states prescription refill should go to [facility] in [place]. Patient states he has to drive 1 hour and 1/2 to receive prescription and would like to be given a call back asap. Please call patient back to advise.  Thank You.

## 2021-12-18 DIAGNOSIS — M9902 Segmental and somatic dysfunction of thoracic region: Secondary | ICD-10-CM | POA: Diagnosis not present

## 2021-12-18 DIAGNOSIS — M542 Cervicalgia: Secondary | ICD-10-CM | POA: Diagnosis not present

## 2021-12-18 DIAGNOSIS — M9901 Segmental and somatic dysfunction of cervical region: Secondary | ICD-10-CM | POA: Diagnosis not present

## 2021-12-18 DIAGNOSIS — M9905 Segmental and somatic dysfunction of pelvic region: Secondary | ICD-10-CM | POA: Diagnosis not present

## 2021-12-18 DIAGNOSIS — M5442 Lumbago with sciatica, left side: Secondary | ICD-10-CM | POA: Diagnosis not present

## 2021-12-18 DIAGNOSIS — M546 Pain in thoracic spine: Secondary | ICD-10-CM | POA: Diagnosis not present

## 2021-12-20 ENCOUNTER — Encounter: Payer: Self-pay | Admitting: Internal Medicine

## 2021-12-20 ENCOUNTER — Ambulatory Visit: Payer: Medicare PPO | Admitting: Internal Medicine

## 2021-12-20 VITALS — BP 110/74 | HR 70 | Ht 71.5 in | Wt 184.0 lb

## 2021-12-20 DIAGNOSIS — K5900 Constipation, unspecified: Secondary | ICD-10-CM

## 2021-12-20 DIAGNOSIS — K648 Other hemorrhoids: Secondary | ICD-10-CM

## 2021-12-20 NOTE — Progress Notes (Signed)
Russell [name] 76 y.o. [birth year] [id number]  Assessment & Plan:   Encounter Diagnoses  Name Primary?   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  He feels like it is manageable right now and he has a collapsed right ankle and bunion that is giving him problems and he does not want to do any other procedures right now i.e. does not want to pursue hemorrhoid banding.  I told him that is fine we can hold that in reserve.  I do think banding of the left lateral internal hemorrhoid reduce problems with the external component.  He will follow-up as needed and continue measures to avoid constipation and straining.  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  That is led to constipation and some perianal/rectal numbness issues.  He saw Ellouise Newer, PA-C in April she found an external hemorrhoid that was inflamed and prescribed hydrocortisone cream which helped.  His symptoms are much better.  He will still struggles with variable bowel movements and uses a mixture of MiraLAX, stool softener and Dulcolax.  That is all been an issue since this arachnoiditis.  He feels like his hemorrhoids are controllable at this point even though they are intermittently symptomatic.  He is up-to-date on colonoscopy (06/04/2018 1 diminutive SSP and internal hemorrhoids) No Known Allergies Current Meds  Medication Sig   aspirin 81 MG EC tablet Take 81 mg by mouth every evening.   finasteride (PROSCAR) 5 MG tablet Take 1 tablet (5 mg total) by mouth daily.   Ginger, Zingiber officinalis, (GINGER PO) Take 4 g  by mouth daily.   hydrocortisone (ANUSOL-HC) 2.5 % rectal cream Place 1 application. rectally 2 (two) times daily.   Magnesium 400 MG CAPS Take 400 mg by mouth 2 (two) times daily. asporotate   melatonin 3 MG TABS tablet Take 3 mg by mouth at bedtime.   metroNIDAZOLE (METROGEL) 0.75 % gel Apply to face daily for rosacea   Omega-3 Fatty Acids (FISH OIL) 1200 MG CAPS Take 1,200 mg by mouth daily.   omeprazole (PRILOSEC) 40 MG capsule TAKE (1) CAPSULE BY MOUTH ONCE DAILY.   polyethylene glycol powder (GLYCOLAX/MIRALAX) 17 GM/SCOOP powder Take 0.5 Containers by mouth daily as needed for moderate constipation or mild constipation.   Probiotic Product (PROBIOTIC DAILY PO) Take 1 tablet by mouth 2 (two) times daily.   senna-docusate (SENOKOT-S) 8.6-50 MG tablet Take 2 tablets by mouth at bedtime as needed for mild constipation.   silodosin (RAPAFLO) 8 MG CAPS capsule Take 1 capsule (8 mg total) by mouth daily with breakfast.   Tart Cherry 1200 MG CAPS Take 3,600 capsules by mouth daily.   TURMERIC PO Take 750 mg by mouth daily. curamed curcumin   Zinc Sulfate (ZINC 15 PO) Take 15 mg by mouth daily.   [DISCONTINUED] MISC NATURAL PRODUCTS PO Take 1 capsule by mouth daily. 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   anterior cer disc and fusion with plates C3-4 and S2-8 at [facility]   Constipation    Enlarged prostate    GERD (gastroesophageal reflux disease)    Hemorrhoids    History of kidney stones    passed stones   History of lumbar laminectomy 05/12/2020   open L2-L5 at [facility]   Hx of colonic polyp 05/29/2018   05/2018 cecal ssp   Lung nodule 02/2021   Maxillary sinus polyp    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   Prolapsed internal  hemorrhoids, grade 3 09/10/2006   Qualifier: Diagnosis of  By: Jonna Munro MD, Cornelius     Salivary gland enlargement    Sleep apnea 10 years ago   declined CPAP   Stroke ([place]) 15-20 years ago   Past Surgical History:  Procedure Laterality Date   BRONCHIAL BIOPSY  05/16/2021   Procedure: BRONCHIAL BIOPSIES;  Surgeon: Garner Nash, DO;  Location: [place] ENDOSCOPY;  Service: Pulmonary;;   BRONCHIAL BRUSHINGS  05/16/2021   Procedure: BRONCHIAL BRUSHINGS;  Surgeon: Garner Nash, DO;  Location: [place];  Service: Pulmonary;;   BRONCHIAL NEEDLE ASPIRATION BIOPSY  05/16/2021   Procedure: BRONCHIAL NEEDLE ASPIRATION BIOPSIES;  Surgeon: Garner Nash, DO;  Location: [place] ENDOSCOPY;  Service: Pulmonary;;   CERVICAL SPINE SURGERY  04/10/2020   C3-C4 with pin at [facility]   COLONOSCOPY     FIDUCIAL MARKER PLACEMENT  05/16/2021   Procedure: FIDUCIAL MARKER PLACEMENT;  Surgeon: Garner Nash, DO;  Location: [place] ENDOSCOPY;  Service: Pulmonary;;   HEMORRHOID BANDING     LUMBAR LAMINECTOMY  03/08/2010   L2-3, cauda equina syndrome   lumbar laminectomy  05/12/2020   [facility]   UPPER GI ENDOSCOPY     VIDEO BRONCHOSCOPY WITH ENDOBRONCHIAL NAVIGATION Left 05/16/2021   Procedure: VIDEO BRONCHOSCOPY WITH ENDOBRONCHIAL NAVIGATION;  Surgeon: Garner Nash, DO;  Location: [place];  Service: Pulmonary;  Laterality: Left;  ION w/ fiducial placement   VIDEO BRONCHOSCOPY WITH RADIAL ENDOBRONCHIAL ULTRASOUND  05/16/2021   Procedure: VIDEO BRONCHOSCOPY WITH RADIAL ENDOBRONCHIAL ULTRASOUND;  Surgeon: Garner Nash, DO;  [name];  Service: Pulmonary;;   wisdom teeth ext     Social History   Social History Narrative   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   family history includes Aortic aneurysm in his mother,  sister, sister, and sister; CVA in his sister; Heart attack in his father.   Review of Systems As per HPI  Objective:   Physical Exam BP 110/74   Pulse 70   Ht 5' 11.5" (1.816 m)   Wt 184 lb (83.5 kg)   SpO2 99%   BMI 25.31 kg/m  Rectal exam reveals a left lateral/posterior inflamed friable external hemorrhoid no mass.  Anoscopy shows the same external hemorrhoid and there is an attached/associated internal component as well.  The external hemorrhoid is a grade 3 prolapse.  He has grade 1 right posterior and right anterior hemorrhoids (internal).

## 2021-12-20 NOTE — Patient Instructions (Addendum)
Keep managing your constipation.  If you are age 74 or older, your body mass index should be between 23-30. Your Body mass index is 25.31 kg/m. If this is out of the aforementioned range listed, please consider follow up with your Primary Care Provider.  If you are age 87 or younger, your body mass index should be between 19-25. Your Body mass index is 25.31 kg/m. If this is out of the aformentioned range listed, please consider follow up with your Primary Care Provider.   ________________________________________________________  The [place] [facility] providers would like to encourage you to use [facility] to communicate with providers for non-urgent requests or questions.  Due to long hold times on the telephone, sending your provider a message by [facility] may be a faster and more efficient way to get a response.  Please allow 48 business hours for a response.  Please remember that this is for non-urgent requests.  _______________________________________________________  Due to recent changes in healthcare laws, you may see the results of your imaging and laboratory studies on MyChart before your provider has had a chance to review them.  We understand that in some cases there may be results that are confusing or concerning to you. Not all laboratory results come back in the same time frame and the provider may be waiting for multiple results in order to interpret others.  Please give [place] 48 hours in order for your provider to thoroughly review all the results before contacting the office for clarification of your results.   I appreciate the opportunity to care for you. Silvano Rusk, MD, [facility]

## 2022-01-01 DIAGNOSIS — M546 Pain in thoracic spine: Secondary | ICD-10-CM | POA: Diagnosis not present

## 2022-01-01 DIAGNOSIS — M9902 Segmental and somatic dysfunction of thoracic region: Secondary | ICD-10-CM | POA: Diagnosis not present

## 2022-01-01 DIAGNOSIS — M9901 Segmental and somatic dysfunction of cervical region: Secondary | ICD-10-CM | POA: Diagnosis not present

## 2022-01-01 DIAGNOSIS — M9905 Segmental and somatic dysfunction of pelvic region: Secondary | ICD-10-CM | POA: Diagnosis not present

## 2022-01-01 DIAGNOSIS — M5442 Lumbago with sciatica, left side: Secondary | ICD-10-CM | POA: Diagnosis not present

## 2022-01-01 DIAGNOSIS — M542 Cervicalgia: Secondary | ICD-10-CM | POA: Diagnosis not present

## 2022-01-04 DIAGNOSIS — M21621 Bunionette of right foot: Secondary | ICD-10-CM | POA: Diagnosis not present

## 2022-01-04 DIAGNOSIS — Z87891 Personal history of nicotine dependence: Secondary | ICD-10-CM | POA: Diagnosis not present

## 2022-01-04 DIAGNOSIS — S343XXS Injury of cauda equina, sequela: Secondary | ICD-10-CM | POA: Diagnosis not present

## 2022-01-04 DIAGNOSIS — M2141 Flat foot [pes planus] (acquired), right foot: Secondary | ICD-10-CM | POA: Diagnosis not present

## 2022-01-04 DIAGNOSIS — C801 Malignant (primary) neoplasm, unspecified: Secondary | ICD-10-CM | POA: Diagnosis not present

## 2022-01-04 DIAGNOSIS — S93304A Unspecified dislocation of right foot, initial encounter: Secondary | ICD-10-CM | POA: Diagnosis not present

## 2022-01-04 DIAGNOSIS — M76829 Posterior tibial tendinitis, unspecified leg: Secondary | ICD-10-CM | POA: Diagnosis not present

## 2022-01-09 ENCOUNTER — Ambulatory Visit: Payer: Medicare PPO | Admitting: Internal Medicine

## 2022-01-09 ENCOUNTER — Encounter: Payer: Self-pay | Admitting: Internal Medicine

## 2022-01-09 VITALS — BP 124/82 | HR 79 | Resp 18 | Ht 71.5 in | Wt 182.2 lb

## 2022-01-09 DIAGNOSIS — G039 Meningitis, unspecified: Secondary | ICD-10-CM

## 2022-01-09 DIAGNOSIS — C3432 Malignant neoplasm of lower lobe, left bronchus or lung: Secondary | ICD-10-CM

## 2022-01-09 DIAGNOSIS — M5416 Radiculopathy, lumbar region: Secondary | ICD-10-CM

## 2022-01-09 MED ORDER — GABAPENTIN 300 MG PO CAPS: 300.0000 mg | ORAL_CAPSULE | Freq: Every day | ORAL | 1 refills | Status: DC

## 2022-01-09 MED ORDER — CYCLOBENZAPRINE HCL 10 MG PO TABS: 10.0000 mg | ORAL_TABLET | Freq: Every day | ORAL | 0 refills | Status: DC

## 2022-01-09 NOTE — Assessment & Plan Note (Addendum)
Reports history of arachnoiditis Started gabapentin 300 mg qHS Followed by pain clinic Had lengthy discussion about measures for pain control including neuropathic pain - answered questions up to my best ability.

## 2022-01-09 NOTE — Assessment & Plan Note (Signed)
Former smoker Followed by Pulmonology and Oncology S/p lobectomy at [facility] 

## 2022-01-09 NOTE — Progress Notes (Addendum)
Established Patient Office Visit  Subjective:  Patient ID: Russell [name], male    DOB: 10-24-1945  Age: 76 y.o. MRN: [id number]  CC:  Chief Complaint  Patient presents with   Pain Management    Pt would like referral to pain management he has nerve pain in back down left leg this has been going on and off since 2021    HPI [name] is a 76 y.o. male with past medical history of DDD of cervical and lumbar spine, cauda equina syndrome, GERD, lung cancer and BPH who presents for f/u of his chronic medical conditions.  He has history of DDD of cervical and lumbar spine.  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  He sees Dr. Katrinka Blazing at [facility] spine and pain management clinic.  He has worsening of his symptoms with bending, especially while tying his shoelaces.  His symptoms are worse in the evening.  He has been treated with steroids, gabapentin and muscle relaxers in the past.  He is undergoing evaluation for spinal stimulator.  He sees Dr. Francine Graven for history of left lower lobe lung CA and has had resection of left lower lobe with Dr. Mikel Cella at [facility].  He denies any chest pain, dyspnea or wheezing currently.  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   anterior cer disc and fusion with plates W0-9 and C4-5 at [facility]   Constipation    Enlarged prostate    GERD (gastroesophageal reflux disease)    Hemorrhoids    History of kidney stones    passed stones   History of lumbar laminectomy 05/12/2020   open L2-L5 at [facility]   Hx of colonic polyp 05/29/2018   05/2018 cecal ssp   Lung nodule 02/2021   Maxillary sinus polyp    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   Prolapsed internal  hemorrhoids, grade 3 09/10/2006   Qualifier: Diagnosis of  By: Erby Pian MD, Cornelius     Salivary gland enlargement    Sleep apnea 10 years ago   declined CPAP   Stroke ([facility]) 15-20 years ago    Past Surgical History:  Procedure Laterality Date   BRONCHIAL BIOPSY  05/16/2021   Procedure: BRONCHIAL BIOPSIES;  Surgeon: Josephine Igo, DO;  [name];  Service: Pulmonary;;   BRONCHIAL BRUSHINGS  05/16/2021   Procedure: BRONCHIAL BRUSHINGS;  Surgeon: Josephine Igo, DO;  [name];  Service: Pulmonary;;   BRONCHIAL NEEDLE ASPIRATION BIOPSY  05/16/2021   Procedure: BRONCHIAL NEEDLE ASPIRATION BIOPSIES;  Surgeon: Josephine Igo, DO;  [name];  Service: Pulmonary;;   CERVICAL SPINE SURGERY  04/10/2020   C3-C4 with pin at [facility]   COLONOSCOPY     FIDUCIAL MARKER PLACEMENT  05/16/2021   Procedure: FIDUCIAL MARKER PLACEMENT;  Surgeon: Josephine Igo, DO;  [name];  Service: Pulmonary;;   HEMORRHOID BANDING     LUMBAR LAMINECTOMY  03/08/2010   L2-3, cauda equina syndrome   lumbar laminectomy  05/12/2020   [facility]   UPPER GI ENDOSCOPY     VIDEO BRONCHOSCOPY WITH ENDOBRONCHIAL NAVIGATION Left 05/16/2021   Procedure: VIDEO BRONCHOSCOPY WITH ENDOBRONCHIAL  NAVIGATION;  Surgeon: Josephine Igo, DO;  [name];  Service: Pulmonary;  Laterality: Left;  ION w/ fiducial placement   VIDEO BRONCHOSCOPY WITH RADIAL ENDOBRONCHIAL ULTRASOUND  05/16/2021   Procedure: VIDEO BRONCHOSCOPY WITH RADIAL ENDOBRONCHIAL ULTRASOUND;  Surgeon: Josephine Igo, DO;  [name];  Service: Pulmonary;;   wisdom teeth ext      Family History  Problem Relation Age of Onset   Aortic aneurysm Mother    Heart attack Father    Aortic aneurysm Sister    CVA Sister    Aortic aneurysm Sister    Aortic aneurysm Sister    Colon cancer Neg Hx    Pancreatic cancer Neg Hx    Rectal cancer Neg Hx    Stomach cancer Neg Hx    Esophageal cancer Neg Hx     Colon polyps Neg Hx     Social History   Socioeconomic History   Marital status: Married    Spouse name: Not on file   Number of children: 0   Years of education: Not on file   Highest education level: Not on file  Occupational History   Occupation: retired  Tobacco Use   Smoking status: Former    Packs/day: 0.25    Years: 9.00    Total pack years: 2.25    Types: Cigarettes    Start date: 10/15/1965    Quit date: 10/16/1974    Years since quitting: 47.2   Smokeless tobacco: Never  Vaping Use   Vaping Use: Never used  Substance and Sexual Activity   Alcohol use: Yes    Comment: rarely wine   Drug use: No   Sexual activity: Yes  Other Topics Concern   Not on file  Social History Narrative   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   Social Determinants of Health   Financial Resource Strain: Low Risk  (06/22/2019)   Overall Financial Resource Strain (CARDIA)    Difficulty of Paying Living Expenses: Not hard at all  Food Insecurity: No Food Insecurity (06/22/2019)   Hunger Vital Sign    Worried About Running Out of Food in the Last Year: Never true    Ran Out of Food in the Last Year: Never true  Transportation Needs: No Transportation Needs (06/22/2019)   PRAPARE - Administrator, Civil Service (Medical): No    Lack of Transportation (Non-Medical): No  Physical Activity: Insufficiently Active (06/22/2019)   Exercise Vital Sign    Days of Exercise per Week: 3 days    Minutes of Exercise per Session: 40 min  Stress: No Stress Concern Present (06/22/2019)   [employer] of Occupational Health - Occupational Stress Questionnaire    Feeling of Stress : Not at all  Social Connections: Socially Integrated (06/22/2019)   Social Connection and Isolation Panel [NHANES]    Frequency of  Communication with Friends and Family: More than three times a week    Frequency of Social Gatherings with Friends and Family: More than three times a week    Attends Religious Services: 1 to 4 times per year    Active Member of [employer] or Organizations: Yes    Attends Banker Meetings: 1 to 4 times per year  Marital Status: Married  Catering manager Violence: Not At Risk (06/22/2019)   Humiliation, Afraid, Rape, and Kick questionnaire    Fear of Current or Ex-Partner: No    Emotionally Abused: No    Physically Abused: No    Sexually Abused: No    Outpatient Medications Prior to Visit  Medication Sig Dispense Refill   aspirin 81 MG EC tablet Take 81 mg by mouth every evening.     finasteride (PROSCAR) 5 MG tablet Take 1 tablet (5 mg total) by mouth daily. 90 tablet 3   Ginger, Zingiber officinalis, (GINGER PO) Take 4 g by mouth daily.     hydrocortisone (ANUSOL-HC) 2.5 % rectal cream Place 1 application. rectally 2 (two) times daily. 30 g 1   Magnesium 400 MG CAPS Take 400 mg by mouth 2 (two) times daily. asporotate     melatonin 3 MG TABS tablet Take 3 mg by mouth at bedtime.     metroNIDAZOLE (METROGEL) 0.75 % gel Apply to face daily for rosacea 45 g 4   Omega-3 Fatty Acids (FISH OIL) 1200 MG CAPS Take 1,200 mg by mouth daily.     omeprazole (PRILOSEC) 40 MG capsule TAKE (1) CAPSULE BY MOUTH ONCE DAILY. 90 capsule 0   polyethylene glycol powder (GLYCOLAX/MIRALAX) 17 GM/SCOOP powder Take 0.5 Containers by mouth daily as needed for moderate constipation or mild constipation.     Probiotic Product (PROBIOTIC DAILY PO) Take 1 tablet by mouth 2 (two) times daily.     senna-docusate (SENOKOT-S) 8.6-50 MG tablet Take 2 tablets by mouth at bedtime as needed for mild constipation.     silodosin (RAPAFLO) 8 MG CAPS capsule Take 1 capsule (8 mg total) by mouth daily with breakfast. 30 capsule 11   Tart Cherry 1200 MG CAPS Take 3,600 capsules by mouth daily.     TURMERIC PO Take 750  mg by mouth daily. curamed curcumin     Zinc Sulfate (ZINC 15 PO) Take 15 mg by mouth daily.     No facility-administered medications prior to visit.    No Known Allergies  ROS Review of Systems  Constitutional:  Negative for chills and fever.  HENT:  Negative for congestion and sore throat.   Eyes:  Negative for pain and discharge.  Respiratory:  Negative for cough and shortness of breath.   Cardiovascular:  Negative for chest pain and palpitations.  Gastrointestinal:  Negative for diarrhea, nausea and vomiting.  Endocrine: Negative for polydipsia and polyuria.  Genitourinary:  Negative for dysuria and hematuria.  Musculoskeletal:  Positive for back pain and neck pain. Negative for neck stiffness.  Skin:  Negative for rash.  Neurological:  Positive for weakness and numbness. Negative for headaches.  Psychiatric/Behavioral:  Negative for agitation and behavioral problems.       Objective:    Physical Exam Vitals reviewed.  Constitutional:      General: He is not in acute distress.    Appearance: He is not diaphoretic.  HENT:     Head: Normocephalic and atraumatic.     Nose: Nose normal.     Mouth/Throat:     Mouth: Mucous membranes are moist.  Eyes:     General: No scleral icterus.    Extraocular Movements: Extraocular movements intact.  Cardiovascular:     Rate and Rhythm: Normal rate and regular rhythm.     Pulses: Normal pulses.     Heart sounds: Normal heart sounds. No murmur heard. Pulmonary:     Breath sounds: Normal breath  sounds. No wheezing or rales.  Musculoskeletal:     Cervical back: Neck supple. No tenderness.     Right lower leg: No edema.     Left lower leg: No edema.  Skin:    General: Skin is warm.     Findings: No rash.  Neurological:     General: No focal deficit present.     Mental Status: He is alert and oriented to person, place, and time.  Psychiatric:        Mood and Affect: Mood normal.        Behavior: Behavior normal.     BP  124/82 (BP Location: Right Arm, Patient Position: Sitting, Cuff Size: Normal)   Pulse 79   Resp 18   Ht 5' 11.5" (1.816 m)   Wt 182 lb 3.2 oz (82.6 kg)   SpO2 97%   BMI 25.06 kg/m  Wt Readings from Last 3 Encounters:  01/09/22 182 lb 3.2 oz (82.6 kg)  12/20/21 184 lb (83.5 kg)  11/06/21 182 lb 6.4 oz (82.7 kg)    Lab Results  Component Value Date   TSH 1.64 09/28/2019   Lab Results  Component Value Date   WBC 5.0 05/16/2021   HGB 12.7 (L) 05/16/2021   HCT 40.5 05/16/2021   MCV 81.3 05/16/2021   PLT 201 05/16/2021   Lab Results  Component Value Date   NA 137 05/16/2021   K 3.8 05/16/2021   CO2 25 05/16/2021   GLUCOSE 97 05/16/2021   BUN 18 05/16/2021   CREATININE 0.68 05/16/2021   BILITOT 0.6 09/28/2019   ALKPHOS 44 09/28/2019   AST 21 09/28/2019   ALT 16 09/28/2019   PROT 6.9 09/28/2019   ALBUMIN 4.2 09/28/2019   CALCIUM 8.9 05/16/2021   ANIONGAP 7 05/16/2021   GFR 112.81 09/28/2019   Lab Results  Component Value Date   CHOL 154 02/10/2009   Lab Results  Component Value Date   HDL 53 02/10/2009   Lab Results  Component Value Date   LDLCALC 86 02/10/2009   Lab Results  Component Value Date   TRIG 77 02/10/2009   Lab Results  Component Value Date   CHOLHDL 2.9 Ratio 02/10/2009   Lab Results  Component Value Date   HGBA1C 5.6 09/29/2019      Assessment & Plan:   Problem List Items Addressed This Visit       Respiratory   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 Katrinka Blazing at [facility] spine and pain management clinic Planning to get spinal stimulator Has had PT Started gabapentin 300 mg nightly Also has piriformis syndrome -Flexeril as needed      Relevant Medications   gabapentin (NEURONTIN) 300 MG capsule    cyclobenzaprine (FLEXERIL) 10 MG tablet   Arachnoiditis - Primary    Reports history of arachnoiditis Started gabapentin 300 mg qHS Followed by pain clinic Had lengthy discussion about measures for pain control including neuropathic pain - answered questions up to my best ability.      Relevant Medications   gabapentin (NEURONTIN) 300 MG capsule    Meds ordered this encounter  Medications   gabapentin (NEURONTIN) 300 MG capsule    Sig: Take 1 capsule (300 mg total) by mouth  at bedtime.    Dispense:  90 capsule    Refill:  1   cyclobenzaprine (FLEXERIL) 10 MG tablet    Sig: Take 1 tablet (10 mg total) by mouth at bedtime.    Dispense:  30 tablet    Refill:  0    Follow-up: Return in about 4 months (around 05/11/2022) for Annual physical.    Anabel Halon, MD

## 2022-01-15 DIAGNOSIS — M546 Pain in thoracic spine: Secondary | ICD-10-CM | POA: Diagnosis not present

## 2022-01-15 DIAGNOSIS — M542 Cervicalgia: Secondary | ICD-10-CM | POA: Diagnosis not present

## 2022-01-15 DIAGNOSIS — M9905 Segmental and somatic dysfunction of pelvic region: Secondary | ICD-10-CM | POA: Diagnosis not present

## 2022-01-15 DIAGNOSIS — M5442 Lumbago with sciatica, left side: Secondary | ICD-10-CM | POA: Diagnosis not present

## 2022-01-15 DIAGNOSIS — M9901 Segmental and somatic dysfunction of cervical region: Secondary | ICD-10-CM | POA: Diagnosis not present

## 2022-01-15 DIAGNOSIS — M9902 Segmental and somatic dysfunction of thoracic region: Secondary | ICD-10-CM | POA: Diagnosis not present

## 2022-01-17 NOTE — Progress Notes (Signed)
Office Visit Note  Patient: Russell [name]             Date of Birth: [birth year]           MRN: [id number]             PCP: [name], MD Referring: Marrian Salvage,* Visit Date: 01/30/2022 Occupation: @GUAROCC @  Subjective:  Pain in multiple joints  History of Present Illness: Russell [name] is a 76 y.o. male with history of osteoarthritis, degenerative disc disease, positive ANA and Raynaud's phenomenon.  He states that he continues to have severe lower back pain with the left-sided radiculopathy.  He states he has an appointment coming up at [facility] for the stimulator placement.  He has some stiffness in his hands but is manageable.  He uses a cane for mobility due to his right ankle joint problem.  He states he will eventually require surgery on his right ankle.  His left knee joint hurts off and on.  He denies any history of joint swelling.  He has not had any recent problems with Raynauds.  Activities of Daily Living:  Patient reports morning stiffness for 0  none .   Patient Reports nocturnal pain.  Difficulty dressing/grooming: Reports Difficulty climbing stairs: Reports Difficulty getting out of chair: Denies Difficulty using hands for taps, buttons, cutlery, and/or writing: Reports  Review of Systems  Constitutional:  Positive for fatigue.  HENT:  Negative for mouth dryness.   Eyes:  Negative for dryness.  Respiratory:  Negative for shortness of breath.   Cardiovascular:  Positive for swelling in legs/feet.  Gastrointestinal:  Positive for constipation and diarrhea.  Endocrine: Negative for increased urination.  Genitourinary:  Positive for decreased urine output and difficulty urinating.  Musculoskeletal:  Positive for gait problem, muscle weakness and muscle tenderness.  Skin:  Negative for rash.  Allergic/Immunologic: Negative for susceptible to infections.  Neurological:  Positive for numbness and weakness.  Hematological:  Negative for  bruising/bleeding tendency.  Psychiatric/Behavioral:  Negative for sleep disturbance.     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 ([place]) 04/21/2021   Elevated PSA 02/16/2021   Erectile dysfunction due to arterial insufficiency 05/11/2020   Benign prostatic hyperplasia with urinary obstruction 04/11/2020   Urinary retention 04/11/2020   DDD (degenerative disc disease), cervical 06/11/2018   DDD (degenerative disc disease), lumbar 06/11/2018   History of kidney stones 06/11/2018   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[place]) 06/11/2018   Primary osteoarthritis of left knee 06/11/2018   Primary osteoarthritis of both feet 06/11/2018   Plantar fasciitis 06/11/2018   Hx of colonic polyp 05/29/2018   Left lumbar radiculopathy 10/93/2355   Diastolic dysfunction 73/22/0254   Psoriasis 10/15/2016   GERD 10/02/2007   Prolapsed internal hemorrhoids, grade 2 and 3 09/10/2006   SLEEP APNEA 09/10/2006    Past Medical History:  Diagnosis Date   Anemia 2019   Arachnoiditis    Arthritis    Bleeding internal hemorrhoids 09/10/2006   Qualifier: Diagnosis of  By: Jonna Munro MD, Cornelius     Cauda equina syndrome [facility]) 2011   Cerebellar infarct [facility])    Cervical spine degeneration  04/07/2020   anterior cer disc and fusion with plates C3-4 and X9-3 at [facility]   Constipation    Enlarged prostate    GERD (gastroesophageal reflux disease)    Hemorrhoids    History of kidney stones    passed stones   History of lumbar laminectomy 05/12/2020   open L2-L5 at [facility]   Hx of colonic polyp 05/29/2018   05/2018 cecal ssp   Lung nodule 02/2021   Maxillary sinus polyp    Memory  loss    related to age   Prolapsed internal hemorrhoids, grade 3 09/10/2006   Qualifier: Diagnosis of  By: Jonna Munro MD, Cornelius     Salivary gland enlargement    Sleep apnea 10 years ago   declined CPAP   Stroke ([place]) 15-20 years ago    Family History  Problem Relation Age of Onset   Aortic aneurysm Mother    Heart attack Father    Aortic aneurysm Sister    CVA Sister    Aortic aneurysm Sister    Aortic aneurysm Sister    Colon cancer Neg Hx    Pancreatic cancer Neg Hx    Rectal cancer Neg Hx    Stomach cancer Neg Hx    Esophageal cancer Neg Hx    Colon polyps Neg Hx    Past Surgical History:  Procedure Laterality Date   BRONCHIAL BIOPSY  05/16/2021   Procedure: BRONCHIAL BIOPSIES;  Surgeon: Garner Nash, DO;  Location: [place] [facility];  Service: Pulmonary;;   BRONCHIAL BRUSHINGS  05/16/2021   Procedure: BRONCHIAL BRUSHINGS;  Surgeon: Garner Nash, DO;  Location: [place] [facility];  Service: Pulmonary;;   BRONCHIAL NEEDLE ASPIRATION BIOPSY  05/16/2021   Procedure: BRONCHIAL NEEDLE ASPIRATION BIOPSIES;  Surgeon: Garner Nash, DO;  Location: [place] [facility];  Service: Pulmonary;;   CERVICAL SPINE SURGERY  04/10/2020   C3-C4 with pin at [facility]   COLONOSCOPY     FIDUCIAL MARKER PLACEMENT  05/16/2021   Procedure: FIDUCIAL MARKER PLACEMENT;  Surgeon: Garner Nash, DO;  Location: [place] [facility];  Service: Pulmonary;;   HEMORRHOID BANDING     LUMBAR LAMINECTOMY  03/08/2010   L2-3, cauda equina syndrome   lumbar laminectomy  05/12/2020   [facility]   UPPER GI ENDOSCOPY     VIDEO BRONCHOSCOPY WITH ENDOBRONCHIAL NAVIGATION Left 05/16/2021   Procedure: VIDEO BRONCHOSCOPY WITH ENDOBRONCHIAL NAVIGATION;  Surgeon: Garner Nash, DO;  [name];  Service: Pulmonary;  Laterality: Left;  ION w/ fiducial placement   VIDEO BRONCHOSCOPY WITH RADIAL ENDOBRONCHIAL ULTRASOUND  05/16/2021   Procedure: VIDEO BRONCHOSCOPY WITH RADIAL ENDOBRONCHIAL ULTRASOUND;  Surgeon: Garner Nash, DO;   Location: [name];  Service: Pulmonary;;   wisdom teeth ext     Social History   Social History Narrative   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   Immunization History  Administered Date(s) Administered   Influenza Split 05/18/2014   Influenza Whole 04/28/2008   Influenza, High Dose Seasonal PF 03/31/2019, 04/08/2020   Influenza,inj,Quad PF,6+ Mos 05/01/2019   Influenza,inj,quad, With Preservative 05/13/2018   Influenza-Unspecified 05/18/2014, 05/13/2018, 04/29/2021   Moderna SARS-COV2 Booster Vaccination 06/01/2020   Moderna Sars-Covid-2 Vaccination 08/21/2019, 09/19/2019, 06/01/2020   Pneumococcal Conjugate-13 10/09/2013   Pneumococcal-Unspecified 10/09/2013   Td 04/28/2008  Tdap 04/28/2008, 06/22/2021   Zoster Recombinat (Shingrix) 12/21/2014   Zoster, Live 12/21/2014   Zoster, Unspecified 12/21/2014     Objective: Vital Signs: BP 118/81 (BP Location: Left Arm, Patient Position: Sitting, Cuff Size: Small)   Pulse 77   Resp 12   Ht 6' (1.829 m)   Wt 186 lb (84.4 kg)   BMI 25.23 kg/m    Physical Exam Vitals and nursing note reviewed.  Constitutional:      Appearance: He is well-developed.  HENT:     Head: Normocephalic and atraumatic.  Eyes:     Conjunctiva/sclera: Conjunctivae normal.     Pupils: Pupils are equal, round, and reactive to light.  Cardiovascular:     Rate and Rhythm: Normal rate and regular rhythm.     Heart sounds: Normal heart sounds.  Pulmonary:     Effort: Pulmonary effort is normal.     Breath sounds: Normal breath sounds.  Abdominal:     General: Bowel sounds are normal.     Palpations: Abdomen is soft.  Musculoskeletal:     Cervical back: Normal range of motion and neck supple.  Skin:    General: Skin is warm and dry.     Capillary Refill:  Capillary refill takes less than 2 seconds.  Neurological:     Mental Status: He is alert and oriented to person, place, and time.  Psychiatric:        Behavior: Behavior normal.      Musculoskeletal Exam: He had limited range of motion of the cervical spine.  He had painful limited range of motion of the lumbar spine with scoliosis.  Shoulder joints, elbow joints, wrist joints with good range of motion.  He had bilateral PIP and DIP and CMC thickening with incomplete fist formation bilaterally due to limited extension and flexion of his PIPs and DIPs.  No synovitis was noted.  Hip joints and knee joints in good range of motion.  He had no tenderness over trochanteric bursa.  He had some thickening of his right ankle joint.  There was no tenderness over ankles or MTPs.  CDAI Exam: CDAI Score: -- Patient Global: --; Provider Global: -- Swollen: --; Tender: -- Joint Exam 01/30/2022   No joint exam has been documented for this visit   There is currently no information documented on the homunculus. Go to the Rheumatology activity and complete the homunculus joint exam.  Investigation: No additional findings.  Imaging: No results found.  Recent Labs: Lab Results  Component Value Date   WBC 5.0 05/16/2021   HGB 12.7 (L) 05/16/2021   PLT 201 05/16/2021   NA 137 05/16/2021   K 3.8 05/16/2021   CL 105 05/16/2021   CO2 25 05/16/2021   GLUCOSE 97 05/16/2021   BUN 18 05/16/2021   CREATININE 0.68 05/16/2021   BILITOT 0.6 09/28/2019   ALKPHOS 44 09/28/2019   AST 21 09/28/2019   ALT 16 09/28/2019   PROT 6.9 09/28/2019   ALBUMIN 4.2 09/28/2019   CALCIUM 8.9 05/16/2021   GFRAA >60 06/13/2018    Speciality Comments: No specialty comments available.  Procedures:  No procedures performed Allergies: Patient has no known allergies.   Assessment / Plan:     Visit Diagnoses: Positive ANA (antinuclear antibody) - he has positive ANA.  ENA is completely negative.  He had no clinical  features of autoimmune disease.  He denies any history of oral ulcers, nasal ulcers, malar rash, photosensitivity, lymphadenopathy.  He has intermittent Raynaud's during the  winter months most likely related to history of smoking.  Primary osteoarthritis of both hands -he has incomplete fist formation and limited extension and flexion of his PIPs and DIPs due to severe osteoarthritis.  X-rays obtained in the past revealed second and third MCP joint narrowing and PIP and DIP severe narrowing.   Primary osteoarthritis of left knee-he has intermittent discomfort in his knee joints.  No warmth swelling or effusion was noted.  Primary osteoarthritis of both feet-he has severe arthritis right ankle joint.  He states he has seen orthopedic surgeon at [facility] and will require right ankle joint surgery.  He also has discomfort in his feet with no synovitis.  DDD (degenerative disc disease), cervical - he was evaluated by Dr. Christella Noa in the past.  He had C-spine fusion in September 2021 by Dr. Redmond Pulling at [facility] for [place].  DDD (degenerative disc disease), lumbar - he had lumbar laminectomy by Dr. Redmond Pulling at [facility] in October 2021.  He continues to have severe lower back pain.  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  He has left-sided radiculopathy.  He has an [facility] for participating in a study for this point of his stim unit.  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  Former smoker - He smoked  1 pack/day for 9 years.  He quit smoking in 1976.  Stress  HYPERTENSION, BENIGN ESSENTIAL  Diastolic dysfunction  NCGS (non-celiac gluten sensitivity)  Cerebellar infarct (HCC)  Hx of colonic polyp  Rosacea - he is followed by dermatology.  History of kidney stones  Orders: No orders of the defined types were placed in this encounter.  No orders of the  defined types were placed in this encounter.    Follow-Up Instructions: Return in about 1 year (around 01/31/2023) for Osteoarthritis.   Bo Merino, MD  Note - This record has been created using Editor, commissioning.  Chart creation errors have been sought, but may not always  have been located. Such creation errors do not reflect on  the standard of medical care.

## 2022-01-22 ENCOUNTER — Ambulatory Visit (INDEPENDENT_AMBULATORY_CARE_PROVIDER_SITE_OTHER): Payer: Medicare PPO | Admitting: Urology

## 2022-01-22 ENCOUNTER — Encounter: Payer: Self-pay | Admitting: Urology

## 2022-01-22 VITALS — BP 129/88 | HR 75

## 2022-01-22 DIAGNOSIS — R972 Elevated prostate specific antigen [PSA]: Secondary | ICD-10-CM

## 2022-01-22 DIAGNOSIS — N401 Enlarged prostate with lower urinary tract symptoms: Secondary | ICD-10-CM

## 2022-01-22 DIAGNOSIS — R339 Retention of urine, unspecified: Secondary | ICD-10-CM | POA: Diagnosis not present

## 2022-01-22 DIAGNOSIS — N138 Other obstructive and reflux uropathy: Secondary | ICD-10-CM | POA: Diagnosis not present

## 2022-01-22 LAB — URINALYSIS, ROUTINE W REFLEX MICROSCOPIC
Bilirubin, UA: NEGATIVE
Glucose, UA: NEGATIVE
Ketones, UA: NEGATIVE
Leukocytes,UA: NEGATIVE
Nitrite, UA: NEGATIVE
Protein,UA: NEGATIVE
RBC, UA: NEGATIVE
Specific Gravity, UA: <1.005 — ABNORMAL LOW (ref 1.005–1.030)
Urobilinogen, Ur: 0.2 mg/dL (ref 0.2–1.0)
pH, UA: 5.5 (ref 5.0–7.5)

## 2022-01-22 LAB — BLADDER SCAN AMB NON-IMAGING: Scan Result: 236

## 2022-01-22 MED ORDER — SILODOSIN 8 MG PO CAPS
8.0000 mg | ORAL_CAPSULE | Freq: Two times a day (BID) | ORAL | 11 refills | Status: DC
Start: 2022-01-22 — End: 2022-02-26

## 2022-01-22 MED ORDER — FINASTERIDE 5 MG PO TABS
5.0000 mg | ORAL_TABLET | Freq: Every day | ORAL | 3 refills | Status: DC
Start: 2022-01-22 — End: 2023-02-28

## 2022-01-22 NOTE — Progress Notes (Signed)
01/22/2022 10:09 AM   Russell [name] 1946-03-07 [id number]  Referring provider: [name], MD [address] [place],  [place] [address]  Difficulty urinating    HPI: [name] is a is a 76yo here for followup for urinary retention and elevated PSA. PSA 1.6 on finasteride. IPSS 21 QOl 5 starting 1 week ago. He is having a weaker urinary stream, straining to urinate and a feeling of incomplete emptying. PVR 236. UA normal. He has severe constipation.    PMH: Past Medical History:  Diagnosis Date   Anemia 2019   Arachnoiditis    Arthritis    Bleeding internal hemorrhoids 09/10/2006   Qualifier: Diagnosis of  By: Jonna Munro MD, Cornelius     Cauda equina syndrome [facility]) 2011   Cerebellar infarct [facility])    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   anterior cer disc and fusion with plates C3-4 and B5-1 at [facility]   Constipation    Enlarged prostate    GERD (gastroesophageal reflux disease)    Hemorrhoids    History of kidney stones    passed stones   History of lumbar laminectomy 05/12/2020   open L2-L5 at [facility]   Hx of colonic polyp 05/29/2018   05/2018 cecal ssp   Lung nodule 02/2021   Maxillary sinus polyp    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   Prolapsed internal hemorrhoids, grade 3 09/10/2006   Qualifier: Diagnosis of  By: Jonna Munro MD, Cornelius     Salivary gland enlargement    Sleep apnea 10 years ago   declined CPAP   Stroke ([place]) 15-20 years ago    Surgical History: Past Surgical History:  Procedure Laterality Date   BRONCHIAL BIOPSY  05/16/2021   Procedure: BRONCHIAL BIOPSIES;  Surgeon: Garner Nash, DO;  Location: [place];  Service: Pulmonary;;   BRONCHIAL BRUSHINGS  05/16/2021   Procedure: BRONCHIAL BRUSHINGS;  Surgeon: Garner Nash, DO;  Location: [place];  Service: Pulmonary;;   BRONCHIAL NEEDLE ASPIRATION BIOPSY  05/16/2021   Procedure: BRONCHIAL NEEDLE ASPIRATION BIOPSIES;  Surgeon: Garner Nash, DO;  Location: [place] ENDOSCOPY;  Service:  Pulmonary;;   CERVICAL SPINE SURGERY  04/10/2020   C3[facility]C4 [facility] pin at [facility]   COLONOSCOPY     FIDUCIAL MARKER PLACEMENT  05/16/2021   Procedure: FIDUCIAL MARKER PLACEMENT;  Surgeon: Garner Nash, DO;  Location: [place] ENDOSCOPY;  Service: Pulmonary;;   HEMORRHOID BANDING     LUMBAR LAMINECTOMY  03/08/2010   L2-3, cauda equina syndrome   lumbar laminectomy  05/12/2020   [facility]   UPPER GI ENDOSCOPY     VIDEO BRONCHOSCOPY WITH ENDOBRONCHIAL NAVIGATION Left 05/16/2021   Procedure: VIDEO BRONCHOSCOPY WITH ENDOBRONCHIAL NAVIGATION;  Surgeon: Garner Nash, DO;  Location: [place];  Service: Pulmonary;  Laterality: Left;  ION w/ fiducial placement   VIDEO BRONCHOSCOPY WITH RADIAL ENDOBRONCHIAL ULTRASOUND  05/16/2021   Procedure: VIDEO BRONCHOSCOPY WITH RADIAL ENDOBRONCHIAL ULTRASOUND;  Surgeon: Garner Nash, DO;  Location: [place] ENDOSCOPY;  Service: Pulmonary;;   wisdom teeth ext      Home Medications:  Allergies as of 01/22/2022   No Known Allergies      Medication List        Accurate as of January 22, 2022 10:09 AM. If you have any questions, ask your nurse or doctor.          aspirin EC 81 MG tablet Take 81 mg by mouth every evening.   cyclobenzaprine 10 MG tablet Commonly known  as: FLEXERIL Take 1 tablet (10 mg total) by mouth at bedtime.   finasteride 5 MG tablet Commonly known as: PROSCAR Take 1 tablet (5 mg total) by mouth daily.   Fish Oil 1200 MG Caps Take 1,200 mg by mouth daily.   gabapentin 300 MG capsule Commonly known as: NEURONTIN Take 1 capsule (300 mg total) by mouth at bedtime.   GINGER PO Take 4 g by mouth daily.   hydrocortisone 2.5 % rectal cream Commonly known as: ANUSOL-HC Place 1 application. rectally 2 (two) times daily.   Magnesium 400 MG Caps Take 400 mg by mouth 2 (two) times daily. asporotate   melatonin 3 MG Tabs tablet Take 3 mg by mouth at bedtime.   metroNIDAZOLE 0.75 % gel Commonly known as: METROGEL Apply to face daily for  rosacea   omeprazole 40 MG capsule Commonly known as: PRILOSEC TAKE (1) CAPSULE BY MOUTH ONCE DAILY.   polyethylene glycol powder 17 GM/SCOOP powder Commonly known as: GLYCOLAX/MIRALAX Take 0.5 Containers by mouth daily as needed for moderate constipation or mild constipation.   PROBIOTIC DAILY PO Take 1 tablet by mouth 2 (two) times daily.   senna-docusate 8.6-50 MG tablet Commonly known as: Senokot-S Take 2 tablets by mouth at bedtime as needed for mild constipation.   silodosin 8 MG Caps capsule Commonly known as: RAPAFLO Take 1 capsule (8 mg total) by mouth daily with breakfast.   Tart Cherry 1200 MG Caps Take 3,600 capsules by mouth daily.   TURMERIC PO Take 750 mg by mouth daily. curamed curcumin   ZINC 15 PO Take 15 mg by mouth daily.        Allergies: No Known Allergies  Family History: Family History  Problem Relation Age of Onset   Aortic aneurysm Mother    Heart attack Father    Aortic aneurysm Sister    CVA Sister    Aortic aneurysm Sister    Aortic aneurysm Sister    Colon cancer Neg Hx    Pancreatic cancer Neg Hx    Rectal cancer Neg Hx    Stomach cancer Neg Hx    Esophageal cancer Neg Hx    Colon polyps Neg Hx     Social History:  reports that he quit smoking about 47 years ago. His smoking use included cigarettes. He started smoking about 56 years ago. He has a 2.25 pack-year smoking history. He has never used smokeless tobacco. He reports current alcohol use. He reports that he does not use drugs.  ROS: All other review of systems were reviewed and are negative except what is noted above in HPI  Physical Exam: BP 129/88   Pulse 75   Constitutional:  Alert and oriented, No acute distress. HEENT: [place] AT, moist mucus membranes.  Trachea midline, no masses. Cardiovascular: No clubbing, cyanosis, or edema. Respiratory: Normal respiratory effort, no increased work of breathing. GI: Abdomen is soft, nontender, nondistended, no abdominal  masses GU: No CVA tenderness.  Lymph: No cervical or inguinal lymphadenopathy. Skin: No rashes, bruises or suspicious lesions. Neurologic: Grossly intact, no focal deficits, moving all 4 extremities. Psychiatric: Normal mood and affect.  Laboratory Data: Lab Results  Component Value Date   WBC 5.0 05/16/2021   HGB 12.7 (L) 05/16/2021   HCT 40.5 05/16/2021   MCV 81.3 05/16/2021   PLT 201 05/16/2021    Lab Results  Component Value Date   CREATININE 0.68 05/16/2021    Lab Results  Component Value Date   PSA 1.95 09/28/2019  PSA 1.10 02/10/2009    Lab Results  Component Value Date   TESTOSTERONE 467.03 09/12/2006    Lab Results  Component Value Date   HGBA1C 5.6 09/29/2019    Urinalysis    Component Value Date/Time   COLORURINE YELLOW 03/07/2010 2315   APPEARANCEUR Clear 06/12/2021 1325   LABSPEC 1.021 03/07/2010 2315   PHURINE 6.0 03/07/2010 2315   GLUCOSEU Negative 06/12/2021 1325   HGBUR NEGATIVE 03/07/2010 2315   BILIRUBINUR Negative 06/12/2021 1325   KETONESUR NEGATIVE 03/07/2010 2315   PROTEINUR Negative 06/12/2021 1325   PROTEINUR NEGATIVE 03/07/2010 2315   UROBILINOGEN 1.0 03/20/2018 1406   UROBILINOGEN 0.2 03/07/2010 2315   NITRITE Negative 06/12/2021 1325   NITRITE NEGATIVE 03/07/2010 2315   LEUKOCYTESUR Negative 06/12/2021 1325    Lab Results  Component Value Date   LABMICR Comment 06/12/2021   WBCUA None seen 03/08/2021   LABEPIT 0-10 03/08/2021   BACTERIA None seen 03/08/2021    Pertinent Imaging:  No results found for this or any previous visit.  No results found for this or any previous visit.  No results found for this or any previous visit.  No results found for this or any previous visit.  No results found for this or any previous visit.  No results found for this or any previous visit.  No results found for this or any previous visit.  No results found for this or any previous visit.   Assessment & Plan:    1.  Urinary retention -Increase rapaflo to 8mg  BD - BLADDER SCAN AMB NON-IMAGING  2. Benign prostatic hyperplasia with urinary obstruction Increased rapaflo to 8mg  BID and continue finasteride 5mg  daily - Urinalysis, Routine w reflex microscopic  3. Elevated PSA -RTC 6 months with PSA   No follow-ups on file.  Nicolette Bang, MD  [facility]

## 2022-01-22 NOTE — Patient Instructions (Signed)

## 2022-01-22 NOTE — Progress Notes (Signed)
post void residual=236

## 2022-01-29 DIAGNOSIS — M5442 Lumbago with sciatica, left side: Secondary | ICD-10-CM | POA: Diagnosis not present

## 2022-01-29 DIAGNOSIS — M542 Cervicalgia: Secondary | ICD-10-CM | POA: Diagnosis not present

## 2022-01-29 DIAGNOSIS — M9901 Segmental and somatic dysfunction of cervical region: Secondary | ICD-10-CM | POA: Diagnosis not present

## 2022-01-29 DIAGNOSIS — M9905 Segmental and somatic dysfunction of pelvic region: Secondary | ICD-10-CM | POA: Diagnosis not present

## 2022-01-29 DIAGNOSIS — M9902 Segmental and somatic dysfunction of thoracic region: Secondary | ICD-10-CM | POA: Diagnosis not present

## 2022-01-29 DIAGNOSIS — M546 Pain in thoracic spine: Secondary | ICD-10-CM | POA: Diagnosis not present

## 2022-01-30 ENCOUNTER — Encounter: Payer: Self-pay | Admitting: Rheumatology

## 2022-01-30 ENCOUNTER — Ambulatory Visit (INDEPENDENT_AMBULATORY_CARE_PROVIDER_SITE_OTHER): Payer: Medicare PPO | Admitting: Rheumatology

## 2022-01-30 VITALS — BP 118/81 | HR 77 | Resp 12 | Ht 72.0 in | Wt 186.0 lb

## 2022-01-30 DIAGNOSIS — M19071 Primary osteoarthritis, right ankle and foot: Secondary | ICD-10-CM | POA: Diagnosis not present

## 2022-01-30 DIAGNOSIS — M5136 Other intervertebral disc degeneration, lumbar region: Secondary | ICD-10-CM

## 2022-01-30 DIAGNOSIS — L719 Rosacea, unspecified: Secondary | ICD-10-CM

## 2022-01-30 DIAGNOSIS — Z87891 Personal history of nicotine dependence: Secondary | ICD-10-CM | POA: Diagnosis not present

## 2022-01-30 DIAGNOSIS — M19042 Primary osteoarthritis, left hand: Secondary | ICD-10-CM

## 2022-01-30 DIAGNOSIS — M51369 Other intervertebral disc degeneration, lumbar region without mention of lumbar back pain or lower extremity pain: Secondary | ICD-10-CM

## 2022-01-30 DIAGNOSIS — C3432 Malignant neoplasm of lower lobe, left bronchus or lung: Secondary | ICD-10-CM

## 2022-01-30 DIAGNOSIS — M5030 Other cervical disc degeneration, unspecified cervical region: Secondary | ICD-10-CM | POA: Diagnosis not present

## 2022-01-30 DIAGNOSIS — M1712 Unilateral primary osteoarthritis, left knee: Secondary | ICD-10-CM

## 2022-01-30 DIAGNOSIS — F439 Reaction to severe stress, unspecified: Secondary | ICD-10-CM | POA: Diagnosis not present

## 2022-01-30 DIAGNOSIS — Z860100 Personal history of colon polyps, unspecified: Secondary | ICD-10-CM

## 2022-01-30 DIAGNOSIS — I10 Essential (primary) hypertension: Secondary | ICD-10-CM

## 2022-01-30 DIAGNOSIS — Z87442 Personal history of urinary calculi: Secondary | ICD-10-CM

## 2022-01-30 DIAGNOSIS — M19041 Primary osteoarthritis, right hand: Secondary | ICD-10-CM | POA: Diagnosis not present

## 2022-01-30 DIAGNOSIS — I639 Cerebral infarction, unspecified: Secondary | ICD-10-CM

## 2022-01-30 DIAGNOSIS — M19072 Primary osteoarthritis, left ankle and foot: Secondary | ICD-10-CM

## 2022-01-30 DIAGNOSIS — I5189 Other ill-defined heart diseases: Secondary | ICD-10-CM

## 2022-01-30 DIAGNOSIS — R768 Other specified abnormal immunological findings in serum: Secondary | ICD-10-CM | POA: Diagnosis not present

## 2022-01-30 DIAGNOSIS — K9041 Non-celiac gluten sensitivity: Secondary | ICD-10-CM

## 2022-01-31 ENCOUNTER — Ambulatory Visit (INDEPENDENT_AMBULATORY_CARE_PROVIDER_SITE_OTHER): Payer: Medicare PPO | Admitting: Internal Medicine

## 2022-01-31 ENCOUNTER — Encounter: Payer: Self-pay | Admitting: Internal Medicine

## 2022-01-31 ENCOUNTER — Ambulatory Visit (INDEPENDENT_AMBULATORY_CARE_PROVIDER_SITE_OTHER): Payer: Medicare PPO | Admitting: *Deleted

## 2022-01-31 VITALS — BP 118/76 | HR 79 | Resp 18 | Ht 72.0 in | Wt 185.6 lb

## 2022-01-31 VITALS — BP 118/76 | HR 79 | Ht 72.0 in | Wt 185.6 lb

## 2022-01-31 DIAGNOSIS — Z1159 Encounter for screening for other viral diseases: Secondary | ICD-10-CM | POA: Diagnosis not present

## 2022-01-31 DIAGNOSIS — Z0000 Encounter for general adult medical examination without abnormal findings: Secondary | ICD-10-CM | POA: Diagnosis not present

## 2022-01-31 DIAGNOSIS — M5116 Intervertebral disc disorders with radiculopathy, lumbar region: Secondary | ICD-10-CM | POA: Diagnosis not present

## 2022-01-31 DIAGNOSIS — M5136 Other intervertebral disc degeneration, lumbar region: Secondary | ICD-10-CM

## 2022-01-31 DIAGNOSIS — M5416 Radiculopathy, lumbar region: Secondary | ICD-10-CM

## 2022-01-31 MED ORDER — CYCLOBENZAPRINE HCL 10 MG PO TABS
10.0000 mg | ORAL_TABLET | Freq: Every day | ORAL | 2 refills | Status: DC
Start: 2022-01-31 — End: 2022-06-18

## 2022-01-31 MED ORDER — GABAPENTIN 300 MG PO CAPS
300.0000 mg | ORAL_CAPSULE | Freq: Two times a day (BID) | ORAL | 1 refills | Status: DC
Start: 2022-01-31 — End: 2022-06-27

## 2022-01-31 NOTE — Assessment & Plan Note (Signed)
Has had lumbar and cervical spine surgery Currently followed by Dr. Tamala Julian at [facility] spine and pain management clinic Planning to get spinal stimulator Has had PT Had started gabapentin 300 mg nightly, increased dose of gabapentin to 300 mg twice daily Also has piriformis syndrome -Flexeril as needed

## 2022-01-31 NOTE — Progress Notes (Signed)
Subjective:   Russell [name] is a 76 y.o. male who presents for Medicare Annual/Subsequent preventive examination.  Review of Systems     [name] , Thank you for taking time to come for your Medicare Wellness Visit. I appreciate your ongoing commitment to your health goals. Please review the following plan we discussed and let me know if I can assist you in the future.   These are the goals we discussed:  Goals      Patient Stated     Maintain my current health status. Watch my weight and stay active.        This is a list of the screening recommended for you and due dates:  Health Maintenance  Topic Date Due   Hepatitis C Screening: USPSTF Recommendation to screen - Ages 79-79 yo.  Never done   Pneumonia Vaccine (2 - PPSV23 or PCV20) 10/10/2014   Zoster (Shingles) Vaccine (2 of 2) 02/15/2015   COVID-19 Vaccine (3 - Moderna risk series) 06/29/2020   Flu Shot  02/13/2022   Colon Cancer Screening  05/17/2023   Tetanus Vaccine  06/23/2031   HPV Vaccine  Aged Out   Cologuard (Stool DNA test)  Discontinued          Objective:    There were no vitals filed for this visit. There is no height or weight on file to calculate BMI.     05/23/2021   10:47 AM 05/16/2021    7:45 AM 07/03/2020    1:37 PM 12/25/2019    1:43 PM 06/22/2019   12:16 PM 06/13/2018    9:37 AM 11/01/2016   12:09 PM  Advanced Directives  Does Patient Have a Medical Advance Directive? Yes Yes Yes Yes Yes Yes No  Type of Paramedic of [place];Living will [place];Living will Healthcare Power of [place];Living will [place];Living will [place];Living will   Does patient want to make changes to medical advance directive? No - Patient declined        Copy of [place] in Chart? No - copy requested Yes - validated most recent copy scanned in chart (See row information)   No -  copy requested    Would patient like information on creating a medical advance directive? No - Patient declined No - Patient declined         Current Medications (verified) Outpatient Encounter Medications as of 01/31/2022  Medication Sig   aspirin 81 MG EC tablet Take 81 mg by mouth every evening.   cyclobenzaprine (FLEXERIL) 10 MG tablet Take 1 tablet (10 mg total) by mouth at bedtime.   finasteride (PROSCAR) 5 MG tablet Take 1 tablet (5 mg total) by mouth daily.   gabapentin (NEURONTIN) 300 MG capsule Take 1 capsule (300 mg total) by mouth at bedtime.   Ginger, Zingiber officinalis, (GINGER PO) Take 4 g by mouth daily.   hydrocortisone (ANUSOL-HC) 2.5 % rectal cream Place 1 application. rectally 2 (two) times daily.   Magnesium 400 MG CAPS Take 400 mg by mouth 2 (two) times daily. asporotate   melatonin 3 MG TABS tablet Take 6 mg by mouth at bedtime.   metroNIDAZOLE (METROGEL) 0.75 % gel Apply to face daily for rosacea   Omega-3 Fatty Acids (FISH OIL) 1200 MG CAPS Take 1,200 mg by mouth daily.   omeprazole (PRILOSEC) 40 MG capsule TAKE (1) CAPSULE BY MOUTH ONCE DAILY.   polyethylene glycol  powder (GLYCOLAX/MIRALAX) 17 GM/SCOOP powder Take 0.5 Containers by mouth daily as needed for moderate constipation or mild constipation.   Probiotic Product (PROBIOTIC DAILY PO) Take 1 tablet by mouth 2 (two) times daily.   senna-docusate (SENOKOT-S) 8.6-50 MG tablet Take 2 tablets by mouth at bedtime as needed for mild constipation.   silodosin (RAPAFLO) 8 MG CAPS capsule Take 1 capsule (8 mg total) by mouth 2 (two) times daily.   Tart Cherry 1200 MG CAPS Take 3,600 capsules by mouth daily.   TURMERIC PO Take 750 mg by mouth daily. curamed curcumin   No facility-administered encounter medications on file as of 01/31/2022.    Allergies (verified) Patient has no known allergies.   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   anterior cer disc and fusion with plates C3-4 and F0-9 at [facility]   Constipation    Enlarged prostate    GERD (gastroesophageal reflux disease)    Hemorrhoids    History of kidney stones    passed stones   History of lumbar laminectomy 05/12/2020   open L2-L5 at [facility]   Hx of colonic polyp 05/29/2018   05/2018 cecal ssp   Lung nodule 02/2021   Maxillary sinus polyp    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   Prolapsed internal hemorrhoids, grade 3 09/10/2006   Qualifier: Diagnosis of  By: Jonna Munro MD, Cornelius     Salivary gland enlargement    Sleep apnea 10 years ago   declined CPAP   Stroke ([place]) 15-20 years ago   Past Surgical History:  Procedure Laterality Date   BRONCHIAL BIOPSY  05/16/2021   Procedure: BRONCHIAL BIOPSIES;  Surgeon: Garner Nash, DO;  Location: [place];  Service: Pulmonary;;   BRONCHIAL BRUSHINGS  05/16/2021   Procedure: BRONCHIAL BRUSHINGS;  Surgeon: Garner Nash, DO;  Location: [place];  Service: Pulmonary;;   BRONCHIAL NEEDLE ASPIRATION BIOPSY  05/16/2021   Procedure: BRONCHIAL NEEDLE ASPIRATION BIOPSIES;  Surgeon: Garner Nash, DO;  Location: [facility];  Service: Pulmonary;;   CERVICAL SPINE SURGERY  04/10/2020   C3-C4 with pin at [facility]   COLONOSCOPY     FIDUCIAL MARKER PLACEMENT  05/16/2021   Procedure: FIDUCIAL MARKER PLACEMENT;  Surgeon: Garner Nash, DO;  Location: [place] [facility];  Service: Pulmonary;;   HEMORRHOID BANDING     LUMBAR LAMINECTOMY  03/08/2010   L2-3, cauda equina syndrome   lumbar laminectomy  05/12/2020   [facility]   UPPER GI ENDOSCOPY     VIDEO BRONCHOSCOPY WITH ENDOBRONCHIAL NAVIGATION Left 05/16/2021   Procedure: VIDEO BRONCHOSCOPY WITH ENDOBRONCHIAL NAVIGATION;  Surgeon: Garner Nash, DO;  Location: [place];  Service: Pulmonary;   Laterality: Left;  ION w/ fiducial placement   VIDEO BRONCHOSCOPY WITH RADIAL ENDOBRONCHIAL ULTRASOUND  05/16/2021   Procedure: VIDEO BRONCHOSCOPY WITH RADIAL ENDOBRONCHIAL ULTRASOUND;  Surgeon: Garner Nash, DO;  Location: [name];  Service: Pulmonary;;   wisdom teeth ext     Family History  Problem Relation Age of Onset   Aortic aneurysm Mother    Heart attack Father    Aortic aneurysm Sister    CVA Sister    Aortic aneurysm Sister    Aortic aneurysm Sister  Colon cancer Neg Hx    Pancreatic cancer Neg Hx    Rectal cancer Neg Hx    Stomach cancer Neg Hx    Esophageal cancer Neg Hx    Colon polyps Neg Hx    Social History   Socioeconomic History   Marital status: Married    Spouse name: Not on file   Number of children: 0   Years of education: Not on file   Highest education level: Not on file  Occupational History   Occupation: retired  Tobacco Use   Smoking status: Former    Packs/day: 0.25    Years: 9.00    Total pack years: 2.25    Types: Cigarettes    Start date: 10/15/1965    Quit date: 10/16/1974    Years since quitting: 47.3    Passive exposure: Past   Smokeless tobacco: Never  Vaping Use   Vaping Use: Never used  Substance and Sexual Activity   Alcohol use: Yes    Comment: rarely wine   Drug use: No   Sexual activity: Yes  Other Topics Concern   Not on file  Social History Narrative   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   Social Determinants of Health   Financial Resource Strain: Low Risk  (06/22/2019)   Overall Financial Resource Strain (CARDIA)    Difficulty of Paying Living Expenses: Not hard at all  Food Insecurity: No Food Insecurity (06/22/2019)   Hunger Vital Sign    Worried About Running Out of Food in the Last Year: Never true    Ran Out of Food in  the Last Year: Never true  Transportation Needs: No Transportation Needs (06/22/2019)   PRAPARE - Hydrologist (Medical): No    Lack of Transportation (Non-Medical): No  Physical Activity: Insufficiently Active (06/22/2019)   Exercise Vital Sign    Days of Exercise per Week: 3 days    Minutes of Exercise per Session: 40 min  Stress: No Stress Concern Present (06/22/2019)   [place]    Feeling of Stress : Not at all  Social Connections: [place] (06/22/2019)   Social Connection and Isolation Panel [NHANES]    Frequency of Communication with Friends and Family: More than three times a week    Frequency of Social Gatherings with Friends and Family: More than three times a week    Attends Religious Services: 1 to 4 times per year    Active Member of [employer] or Organizations: Yes    Attends Archivist Meetings: 1 to 4 times per year    Marital Status: Married    Tobacco Counseling Counseling given: Not Answered   Clinical Intake:              How often do you need to have someone help you when you read instructions, pamphlets, or other written materials from your doctor or pharmacy?: (P) 2 - Rarely  Diabetic?no         Activities of Daily Living    01/29/2022    9:05 PM  In your present state of health, do you have any difficulty performing the following activities:  Hearing? 0  Vision? 0  Difficulty  concentrating or making decisions? 0  Walking or climbing stairs? 1  Dressing or bathing? 0  Doing errands, shopping? 0  Preparing Food and eating ? N  Using the Toilet? N  In the past six months, have you accidently leaked urine? Y  Do you have problems with loss of bowel control? N  Managing your Medications? N  Managing your Finances? N  Housekeeping or managing your Housekeeping? N    Patient Care Team: [name], MD as PCP - General  (Internal Medicine) Lavonna Monarch, MD as Consulting Physician (Dermatology)  Indicate any recent Medical Services you may have received from other than [facility] providers in the past year (date may be approximate).     Assessment:   This is a routine wellness examination for Ciaran.  Hearing/Vision screen No results found.  Dietary issues and exercise activities discussed:     Goals Addressed   None   Depression Screen    01/09/2022   10:26 AM 08/15/2021   10:57 AM 06/22/2021    3:19 PM 06/22/2019   12:05 PM  PHQ 2/9 Scores  PHQ - 2 Score 0 0 0 1  PHQ- 9 Score    1    Fall Risk    01/29/2022    9:05 PM 01/09/2022   10:26 AM 08/15/2021   10:57 AM 07/27/2021   11:38 AM 07/13/2021    9:10 AM  Fall Risk   Falls in the past year? 1 1 0 0 1  Number falls in past yr: 1 0 0  0  Injury with Fall? 0 0 0  0  Risk for fall due to :  History of fall(s);Impaired balance/gait No Fall Risks Impaired balance/gait History of fall(s);Impaired mobility  Follow up  Falls evaluation completed;Education provided Falls evaluation completed Falls evaluation completed Falls evaluation completed    FALL RISK PREVENTION PERTAINING TO THE HOME:  Any stairs in or around the home? Yes  If so, are there any without handrails? No  Home free of loose throw rugs in walkways, pet beds, electrical cords, etc? Yes  Adequate lighting in your home to reduce risk of falls? Yes   ASSISTIVE DEVICES UTILIZED TO PREVENT FALLS:  Life alert? No  Use of a cane, walker or w/c? Yes  Grab bars in the bathroom? No  Shower chair or bench in shower? Yes  Elevated toilet seat or a handicapped toilet? Yes   TIMED UP AND GO:  Was the test performed? Yes .  Length of time to ambulate 10 feet: 40 sec.   Gait slow and steady with assistive device  Cognitive Function:        Immunizations Immunization History  Administered Date(s) Administered   Influenza Split 05/18/2014   Influenza Whole 04/28/2008    Influenza, High Dose Seasonal PF 03/31/2019, 04/08/2020   Influenza,inj,Quad PF,6+ Mos 05/01/2019   Influenza,inj,quad, With Preservative 05/13/2018   Influenza-Unspecified 05/18/2014, 05/13/2018, 04/29/2021   Moderna SARS-COV2 Booster Vaccination 06/01/2020   Moderna Sars-Covid-2 Vaccination 08/21/2019, 09/19/2019, 06/01/2020   Pneumococcal Conjugate-13 10/09/2013   Pneumococcal-Unspecified 10/09/2013   Td 04/28/2008   Tdap 04/28/2008, 06/22/2021   Zoster Recombinat (Shingrix) 12/21/2014   Zoster, Live 12/21/2014   Zoster, Unspecified 12/21/2014    TDAP status: Up to date  Flu Vaccine status: Up to date  Pneumococcal vaccine status: Up to date  Covid-19 vaccine status: Completed vaccines  Qualifies for Shingles Vaccine? Yes   Zostavax completed Yes   Shingrix Completed?: Yes  Screening Tests Health Maintenance  Topic Date Due   Hepatitis C Screening  Never done   Pneumonia Vaccine 37+ Years old (2 - PPSV23 or PCV20) 10/10/2014   Zoster Vaccines- Shingrix (2 of 2) 02/15/2015   COVID-19 Vaccine (3 - Moderna risk series) 06/29/2020   INFLUENZA VACCINE  02/13/2022   COLONOSCOPY (Pts 45-14yrs Insurance coverage will need to be confirmed)  05/17/2023   TETANUS/TDAP  06/23/2031   HPV VACCINES  Aged Out   Fecal DNA (Cologuard)  Discontinued    Health Maintenance  Health Maintenance Due  Topic Date Due   Hepatitis C Screening  Never done   Pneumonia Vaccine 47+ Years old (2 - PPSV23 or PCV20) 10/10/2014   Zoster Vaccines- Shingrix (2 of 2) 02/15/2015   COVID-19 Vaccine (3 - Moderna risk series) 06/29/2020    Colorectal Screening: Completed 05-16-18  Lung Cancer Screening: (Low Dose CT Chest recommended if Age 66-80 years, 30 pack-year currently smoking OR have quit w/in 15years.) does not qualify.   Lung Cancer Screening Referral: NA  Additional Screening:  Hepatitis C Screening: does qualify; ordered  Vision Screening: Recommended annual ophthalmology exams for  early detection of glaucoma and other disorders of the eye. Is the patient up to date with their annual eye exam?  No  Who is the provider or what is the name of the office in which the patient attends annual eye exams? [facility]  If pt is not established with a provider, would they like to be referred to a provider to establish care? No .   Dental Screening: Recommended annual dental exams for proper oral hygiene  Community Resource Referral / Chronic Care Management: CRR required this visit?  No   CCM required this visit?  No      Plan:     I have personally reviewed and noted the following in the patient's chart:   Medical and social history Use of alcohol, tobacco or illicit drugs  Current medications and supplements including opioid prescriptions. Patient is not currently taking opioid prescriptions. Functional ability and status Nutritional status Physical activity Advanced directives List of other physicians Hospitalizations, surgeries, and ER visits in previous 12 months Vitals Screenings to include cognitive, depression, and falls Referrals and appointments  In addition, I have reviewed and discussed with patient certain preventive protocols, quality metrics, and best practice recommendations. A written personalized care plan for preventive services as well as general preventive health recommendations were provided to patient.     Shelda Altes, CMA   01/31/2022   Nurse Notes:  Mr. Kresse , Thank you for taking time to come for your Medicare Wellness Visit. I appreciate your ongoing commitment to your health goals. Please review the following plan we discussed and let me know if I can assist you in the future.   These are the goals we discussed:  Goals      Patient Stated     Maintain my current health status. Watch my weight and stay active.     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  Never done   Pneumonia Vaccine (2 - PPSV23 or PCV20) 10/10/2014   Zoster (Shingles) Vaccine (2 of 2) 02/15/2015   COVID-19 Vaccine (3 - Moderna risk  series) 06/29/2020   Flu Shot  02/13/2022   Colon Cancer Screening  05/17/2023   Tetanus Vaccine  06/23/2031   HPV Vaccine  Aged Out   Cologuard (Stool DNA test)  Discontinued

## 2022-01-31 NOTE — Patient Instructions (Signed)
Please take Gabapentin 300 mg twice daily.  Please take Flexeril as needed for muscle spasms.  Please avoid heavy lifting and frequent bending.

## 2022-01-31 NOTE — Patient Instructions (Signed)
[name] , Thank you for taking time to come for your Medicare Wellness Visit. I appreciate your ongoing commitment to your health goals. Please review the following plan we discussed and let me know if I can assist you in the future.   Screening recommendations/referrals: Recommended yearly ophthalmology/optometry visit for glaucoma screening and checkup Recommended yearly dental visit for hygiene and checkup  Vaccinations: Influenza vaccine: due late August Pneumococcal vaccine: completed  Tdap vaccine: completed  Shingles vaccine: completed     Advanced directives: copy in chart  Conditions/risks identified: Falls  Next appointment: 1 year   Preventive Care 74 Years and Older, Male Preventive care refers to lifestyle choices and visits with your health care provider that can promote health and wellness. What does preventive care include? A yearly physical exam. This is also called an annual well check. Dental exams once or twice a year. Routine eye exams. Ask your health care provider how often you should have your eyes checked. Personal lifestyle choices, including: Daily care of your teeth and gums. Regular physical activity. Eating a healthy diet. Avoiding tobacco and drug use. Limiting alcohol use. Practicing safe sex. Taking low doses of aspirin every day. Taking vitamin and mineral supplements as recommended by your health care provider. What happens during an annual well check? The services and screenings done by your health care provider during your annual well check will depend on your age, overall health, lifestyle risk factors, and family history of disease. Counseling  Your health care provider may ask you questions about your: Alcohol use. Tobacco use. Drug use. Emotional well-being. Home and relationship well-being. Sexual activity. Eating habits. History of falls. Memory and ability to understand (cognition). Work and work Statistician. Screening   You may have the following tests or measurements: Height, weight, and BMI. Blood pressure. Lipid and cholesterol levels. These may be checked every 5 years, or more frequently if you are over 85 years old. Skin check. Lung cancer screening. You may have this screening every year starting at age 56 if you have a 30-pack-year history of smoking and currently smoke or have quit within the past 15 years. Fecal occult blood test (FOBT) of the stool. You may have this test every year starting at age 51. Flexible sigmoidoscopy or colonoscopy. You may have a sigmoidoscopy every 5 years or a colonoscopy every 10 years starting at age 9. Prostate cancer screening. Recommendations will vary depending on your family history and other risks. Hepatitis C blood test. Hepatitis B blood test. Sexually transmitted disease (STD) testing. Diabetes screening. This is done by checking your blood sugar (glucose) after you have not eaten for a while (fasting). You may have this done every 1-3 years. Abdominal aortic aneurysm (AAA) screening. You may need this if you are a current or former smoker. Osteoporosis. You may be screened starting at age 49 if you are at high risk. Talk with your health care provider about your test results, treatment options, and if necessary, the need for more tests. Vaccines  Your health care provider may recommend certain vaccines, such as: Influenza vaccine. This is recommended every year. Tetanus, diphtheria, and acellular pertussis (Tdap, Td) vaccine. You may need a Td booster every 10 years. Zoster vaccine. You may need this after age 51. Pneumococcal 13-valent conjugate (PCV13) vaccine. One dose is recommended after age 55. Pneumococcal polysaccharide (PPSV23) vaccine. One dose is recommended after age 75. Talk to your health care provider about which screenings and vaccines you need and how often you  need them. This information is not intended to replace advice given to you by  your health care provider. Make sure you discuss any questions you have with your health care provider. Document Released: 07/29/2015 Document Revised: 03/21/2016 Document Reviewed: 05/03/2015 Elsevier Interactive Patient Education  2017 [place] Prevention in the Home Falls can cause injuries. They can happen to people of all ages. There are many things you can do to make your home safe and to help prevent falls. What can I do on the outside of my home? Regularly fix the edges of walkways and driveways and fix any cracks. Remove anything that might make you trip as you walk through a door, such as a raised step or threshold. Trim any bushes or trees on the path to your home. Use bright outdoor lighting. Clear any walking paths of anything that might make someone trip, such as rocks or tools. Regularly check to see if handrails are loose or broken. Make sure that both sides of any steps have handrails. Any raised decks and porches should have guardrails on the edges. Have any leaves, snow, or ice cleared regularly. Use sand or salt on walking paths during winter. Clean up any spills in your garage right away. This includes oil or grease spills. What can I do in the bathroom? Use night lights. Install grab bars by the toilet and in the tub and shower. Do not use towel bars as grab bars. Use non-skid mats or decals in the tub or shower. If you need to sit down in the shower, use a plastic, non-slip stool. Keep the floor dry. Clean up any water that spills on the floor as soon as it happens. Remove soap buildup in the tub or shower regularly. Attach bath mats securely with double-sided non-slip rug tape. Do not have throw rugs and other things on the floor that can make you trip. What can I do in the bedroom? Use night lights. Make sure that you have a light by your bed that is easy to reach. Do not use any sheets or blankets that are too big for your bed. They should not hang  down onto the floor. Have a firm chair that has side arms. You can use this for support while you get dressed. Do not have throw rugs and other things on the floor that can make you trip. What can I do in the kitchen? Clean up any spills right away. Avoid walking on wet floors. Keep items that you use a lot in easy-to-reach places. If you need to reach something above you, use a strong step stool that has a grab bar. Keep electrical cords out of the way. Do not use floor polish or wax that makes floors slippery. If you must use wax, use non-skid floor wax. Do not have throw rugs and other things on the floor that can make you trip. What can I do with my stairs? Do not leave any items on the stairs. Make sure that there are handrails on both sides of the stairs and use them. Fix handrails that are broken or loose. Make sure that handrails are as long as the stairways. Check any carpeting to make sure that it is firmly attached to the stairs. Fix any carpet that is loose or worn. Avoid having throw rugs at the top or bottom of the stairs. If you do have throw rugs, attach them to the floor with carpet tape. Make sure that you have a light switch  at the top of the stairs and the bottom of the stairs. If you do not have them, ask someone to add them for you. What else can I do to help prevent falls? Wear shoes that: Do not have high heels. Have rubber bottoms. Are comfortable and fit you well. Are closed at the toe. Do not wear sandals. If you use a stepladder: Make sure that it is fully opened. Do not climb a closed stepladder. Make sure that both sides of the stepladder are locked into place. Ask someone to hold it for you, if possible. Clearly mark and make sure that you can see: Any grab bars or handrails. First and last steps. Where the edge of each step is. Use tools that help you move around (mobility aids) if they are needed. These  include: Canes. Walkers. Scooters. Crutches. Turn on the lights when you go into a dark area. Replace any light bulbs as soon as they burn out. Set up your furniture so you have a clear path. Avoid moving your furniture around. If any of your floors are uneven, fix them. If there are any pets around you, be aware of where they are. Review your medicines with your doctor. Some medicines can make you feel dizzy. This can increase your chance of falling. Ask your doctor what other things that you can do to help prevent falls. This information is not intended to replace advice given to you by your health care provider. Make sure you discuss any questions you have with your health care provider. Document Released: 04/28/2009 Document Revised: 12/08/2015 Document Reviewed: 08/06/2014 Elsevier Interactive Patient Education  2017 [employer].

## 2022-01-31 NOTE — Progress Notes (Signed)
Acute Office Visit  Subjective:    Patient ID: Russell [name], male    DOB: 1946-05-20, 76 y.o.   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  He was recently started on gabapentin.  He states that he has better pain control overall, but has worsening of pain and muscle spasms when he has to bend.  He had severe left thigh and leg pain yesterday after bending, which lasted for rest of the day.  He prefers to avoid opioid medicine. He sees Dr. Tamala Julian at [facility] pain management clinic.  He has worsening of his symptoms with bending, especially while tying his shoelaces.  His symptoms are worse in the evening.  He has been treated with steroids, gabapentin and muscle relaxers in the past.  He is undergoing evaluation for spinal stimulator.  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   anterior cer disc and fusion with plates C3-4 and R4-8 at [facility]   Constipation    Enlarged prostate    GERD (gastroesophageal reflux disease)    Hemorrhoids    History of kidney stones    passed stones   History of lumbar laminectomy 05/12/2020   open L2-L5 at [facility]   Hx of colonic polyp 05/29/2018   05/2018 cecal ssp   Lung nodule 02/2021   Maxillary sinus polyp    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   Prolapsed internal hemorrhoids, grade 3 09/10/2006   Qualifier: Diagnosis of  By: Jonna Munro MD, Cornelius     Salivary gland enlargement    Sleep apnea 10 years ago   declined CPAP    Stroke ([place]) 15-20 years ago    Past Surgical History:  Procedure Laterality Date   BRONCHIAL BIOPSY  05/16/2021   Procedure: BRONCHIAL BIOPSIES;  Surgeon: Garner Nash, DO;  Location: [place] [facility];  Service: Pulmonary;;   BRONCHIAL BRUSHINGS  05/16/2021   Procedure: BRONCHIAL BRUSHINGS;  Surgeon: Garner Nash, DO;  Location: [place];  Service: Pulmonary;;   BRONCHIAL NEEDLE ASPIRATION BIOPSY  05/16/2021   Procedure: BRONCHIAL NEEDLE ASPIRATION BIOPSIES;  Surgeon: Garner Nash, DO;  Location: [place] [facility];  Service: Pulmonary;;   CERVICAL SPINE SURGERY  04/10/2020   C3-C4 with pin at [facility]   COLONOSCOPY     FIDUCIAL MARKER PLACEMENT  05/16/2021   Procedure: FIDUCIAL MARKER PLACEMENT;  Surgeon: Garner Nash, DO;  Location: [place] [facility];  Service: Pulmonary;;   HEMORRHOID BANDING     LUMBAR LAMINECTOMY  03/08/2010   L2-3, cauda equina syndrome   lumbar laminectomy  05/12/2020   [facility]   SPINE SURGERY     UPPER GI ENDOSCOPY     VIDEO BRONCHOSCOPY WITH ENDOBRONCHIAL NAVIGATION Left 05/16/2021   Procedure: VIDEO BRONCHOSCOPY WITH ENDOBRONCHIAL NAVIGATION;  Surgeon: Garner Nash, DO;  Location: [place];  Service: Pulmonary;  Laterality: Left;  ION w/ fiducial placement  VIDEO BRONCHOSCOPY WITH RADIAL ENDOBRONCHIAL ULTRASOUND  05/16/2021   Procedure: VIDEO BRONCHOSCOPY WITH RADIAL ENDOBRONCHIAL ULTRASOUND;  Surgeon: Garner Nash, DO;  Location: [name];  Service: Pulmonary;;   wisdom teeth ext      Family History  Problem Relation Age of Onset   Aortic aneurysm Mother    Varicose Veins Mother    Heart attack Father    Arthritis Father    Aortic aneurysm Sister    Early death Sister    CVA Sister    Aortic aneurysm Sister    Aortic aneurysm Sister    Colon cancer Neg Hx    Pancreatic cancer Neg Hx    Rectal cancer Neg Hx    Stomach cancer Neg Hx    Esophageal cancer Neg Hx    Colon polyps Neg Hx     Social History   Socioeconomic  History   Marital status: Married    Spouse name: Not on file   Number of children: 0   Years of education: Not on file   Highest education level: Not on file  Occupational History   Occupation: retired  Tobacco Use   Smoking status: Former    Packs/day: 0.25    Years: 9.00    Total pack years: 2.25    Types: Cigarettes    Start date: 10/15/1965    Quit date: 10/16/1974    Years since quitting: 47.3    Passive exposure: Past   Smokeless tobacco: Never  Vaping Use   Vaping Use: Never used  Substance and Sexual Activity   Alcohol use: Not Currently    Comment: rarely wine   Drug use: No   Sexual activity: Not Currently  Other Topics Concern   Not on file  Social History Narrative   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   Social Determinants of Health   Financial Resource Strain: Low Risk  (01/31/2022)   Overall Financial Resource Strain (CARDIA)    Difficulty of Paying Living Expenses: Not hard at all  Food Insecurity: No Food Insecurity (01/31/2022)   Hunger Vital Sign    Worried About Running Out of Food in the Last Year: Never true    Ran Out of Food in the Last Year: Never true  Transportation Needs: No Transportation Needs (01/31/2022)   PRAPARE - Hydrologist (Medical): No    Lack of Transportation (Non-Medical): No  Physical Activity: Sufficiently Active (01/31/2022)   Exercise Vital Sign    Days of Exercise per Week: 7 days    Minutes of Exercise per Session: 60 min  Stress: No Stress Concern Present (01/31/2022)   [place]    Feeling of Stress : Not at all  Social Connections: Moderately Isolated (01/31/2022)   Social Connection and Isolation Panel [NHANES]    Frequency of Communication with  Friends and Family: More than three times a week    Frequency of Social Gatherings with Friends and Family: More than three times a week    Attends Religious Services: Never    Marine scientist or Organizations: No    Attends Archivist Meetings: Never    Marital Status: Married  Human resources officer Violence: Not  At Risk (01/31/2022)   Humiliation, Afraid, Rape, and Kick questionnaire    Fear of Current or Ex-Partner: No    Emotionally Abused: No    Physically Abused: No    Sexually Abused: No    Outpatient Medications Prior to Visit  Medication Sig Dispense Refill   aspirin 81 MG EC tablet Take 81 mg by mouth every evening.     finasteride (PROSCAR) 5 MG tablet Take 1 tablet (5 mg total) by mouth daily. 90 tablet 3   Ginger, Zingiber officinalis, (GINGER PO) Take 4 g by mouth daily.     hydrocortisone (ANUSOL-HC) 2.5 % rectal cream Place 1 application. rectally 2 (two) times daily. 30 g 1   Magnesium 400 MG CAPS Take 400 mg by mouth 2 (two) times daily. asporotate     melatonin 3 MG TABS tablet Take 6 mg by mouth at bedtime.     metroNIDAZOLE (METROGEL) 0.75 % gel Apply to face daily for rosacea 45 g 4   Omega-3 Fatty Acids (FISH OIL) 1200 MG CAPS Take 1,200 mg by mouth daily.     omeprazole (PRILOSEC) 40 MG capsule TAKE (1) CAPSULE BY MOUTH ONCE DAILY. 90 capsule 0   polyethylene glycol powder (GLYCOLAX/MIRALAX) 17 GM/SCOOP powder Take 0.5 Containers by mouth daily as needed for moderate constipation or mild constipation.     Probiotic Product (PROBIOTIC DAILY PO) Take 1 tablet by mouth 2 (two) times daily.     senna-docusate (SENOKOT-S) 8.6-50 MG tablet Take 2 tablets by mouth at bedtime as needed for mild constipation.     silodosin (RAPAFLO) 8 MG CAPS capsule Take 1 capsule (8 mg total) by mouth 2 (two) times daily. 30 capsule 11   Tart Cherry 1200 MG CAPS Take 3,600 capsules by mouth daily.     TURMERIC PO Take 750 mg by mouth daily. curamed curcumin      cyclobenzaprine (FLEXERIL) 10 MG tablet Take 1 tablet (10 mg total) by mouth at bedtime. 30 tablet 0   gabapentin (NEURONTIN) 300 MG capsule Take 1 capsule (300 mg total) by mouth at bedtime. 90 capsule 1   No facility-administered medications prior to visit.    No Known Allergies  Review of Systems  Constitutional:  Negative for chills and fever.  HENT:  Negative for congestion and sore throat.   Eyes:  Negative for pain and discharge.  Respiratory:  Negative for cough and shortness of breath.   Cardiovascular:  Negative for chest pain and palpitations.  Gastrointestinal:  Negative for diarrhea, nausea and vomiting.  Endocrine: Negative for polydipsia and polyuria.  Genitourinary:  Negative for dysuria and hematuria.  Musculoskeletal:  Positive for back pain and neck pain. Negative for neck stiffness.  Skin:  Negative for rash.  Neurological:  Positive for weakness and numbness. Negative for headaches.  Psychiatric/Behavioral:  Negative for agitation and behavioral problems.        Objective:    Physical Exam Vitals reviewed.  Constitutional:      General: He is not in acute distress.    Appearance: He is not diaphoretic.  HENT:     Head: Normocephalic and atraumatic.     Nose: Nose normal.     Mouth/Throat:     Mouth: Mucous membranes are moist.  Eyes:     General: No scleral icterus.    Extraocular Movements: Extraocular movements intact.  Cardiovascular:     Rate and Rhythm: Normal rate and regular rhythm.     Pulses: Normal pulses.  Heart sounds: Normal heart sounds. No murmur heard. Pulmonary:     Breath sounds: Normal breath sounds. No wheezing or rales.  Musculoskeletal:     Cervical back: Neck supple. No tenderness.     Right lower leg: No edema.     Left lower leg: No edema.  Skin:    General: Skin is warm.     Findings: No rash.  Neurological:     General: No focal deficit present.     Mental Status: He is alert and oriented to person, place, and  time.  Psychiatric:        Mood and Affect: Mood normal.        Behavior: Behavior normal.     There were no vitals taken for this visit. Wt Readings from Last 3 Encounters:  01/31/22 185 lb 9.6 oz (84.2 kg)  01/30/22 186 lb (84.4 kg)  01/09/22 182 lb 3.2 oz (82.6 kg)        Assessment & Plan:   Problem List Items Addressed This Visit       Nervous and Auditory   Left lumbar radiculopathy    Has had lumbar and cervical spine surgery Currently followed by Dr. Tamala Julian at [facility] spine and pain management clinic Planning to get spinal stimulator Has had PT Had started gabapentin 300 mg nightly, increased dose of gabapentin to 300 mg twice daily Also has piriformis syndrome -Flexeril as needed      Relevant Medications   cyclobenzaprine (FLEXERIL) 10 MG tablet   gabapentin (NEURONTIN) 300 MG capsule     Musculoskeletal and Integument   DDD (degenerative disc disease), lumbar - Primary    On gabapentin and as needed Flexeril Avoid heavy lifting and frequent bending Simple back exercises      Relevant Medications   cyclobenzaprine (FLEXERIL) 10 MG tablet     Meds ordered this encounter  Medications   cyclobenzaprine (FLEXERIL) 10 MG tablet    Sig: Take 1 tablet (10 mg total) by mouth at bedtime.    Dispense:  30 tablet    Refill:  2   gabapentin (NEURONTIN) 300 MG capsule    Sig: Take 1 capsule (300 mg total) by mouth 2 (two) times daily.    Dispense:  180 capsule    Refill:  1     Rozalyn Osland Keith Rake, MD

## 2022-01-31 NOTE — Assessment & Plan Note (Addendum)
On gabapentin and as needed Flexeril Avoid heavy lifting and frequent bending Simple back exercises

## 2022-02-12 ENCOUNTER — Ambulatory Visit: Payer: Medicare PPO | Admitting: Internal Medicine

## 2022-02-16 ENCOUNTER — Ambulatory Visit: Payer: Medicare PPO | Admitting: Urology

## 2022-02-19 DIAGNOSIS — M546 Pain in thoracic spine: Secondary | ICD-10-CM | POA: Diagnosis not present

## 2022-02-19 DIAGNOSIS — M5442 Lumbago with sciatica, left side: Secondary | ICD-10-CM | POA: Diagnosis not present

## 2022-02-19 DIAGNOSIS — M9905 Segmental and somatic dysfunction of pelvic region: Secondary | ICD-10-CM | POA: Diagnosis not present

## 2022-02-19 DIAGNOSIS — M542 Cervicalgia: Secondary | ICD-10-CM | POA: Diagnosis not present

## 2022-02-19 DIAGNOSIS — M9901 Segmental and somatic dysfunction of cervical region: Secondary | ICD-10-CM | POA: Diagnosis not present

## 2022-02-19 DIAGNOSIS — M9902 Segmental and somatic dysfunction of thoracic region: Secondary | ICD-10-CM | POA: Diagnosis not present

## 2022-02-26 ENCOUNTER — Other Ambulatory Visit: Payer: Self-pay

## 2022-02-26 ENCOUNTER — Other Ambulatory Visit: Payer: Self-pay | Admitting: Urology

## 2022-02-26 MED ORDER — SILODOSIN 8 MG PO CAPS: 8.0000 mg | ORAL_CAPSULE | Freq: Two times a day (BID) | ORAL | 11 refills | Status: DC

## 2022-02-27 DIAGNOSIS — M546 Pain in thoracic spine: Secondary | ICD-10-CM | POA: Diagnosis not present

## 2022-02-27 DIAGNOSIS — M5442 Lumbago with sciatica, left side: Secondary | ICD-10-CM | POA: Diagnosis not present

## 2022-02-27 DIAGNOSIS — M9901 Segmental and somatic dysfunction of cervical region: Secondary | ICD-10-CM | POA: Diagnosis not present

## 2022-02-27 DIAGNOSIS — M542 Cervicalgia: Secondary | ICD-10-CM | POA: Diagnosis not present

## 2022-02-27 DIAGNOSIS — M9902 Segmental and somatic dysfunction of thoracic region: Secondary | ICD-10-CM | POA: Diagnosis not present

## 2022-02-27 DIAGNOSIS — M9905 Segmental and somatic dysfunction of pelvic region: Secondary | ICD-10-CM | POA: Diagnosis not present

## 2022-03-28 ENCOUNTER — Telehealth: Payer: Self-pay | Admitting: Internal Medicine

## 2022-03-28 DIAGNOSIS — M9902 Segmental and somatic dysfunction of thoracic region: Secondary | ICD-10-CM | POA: Diagnosis not present

## 2022-03-28 DIAGNOSIS — M9905 Segmental and somatic dysfunction of pelvic region: Secondary | ICD-10-CM | POA: Diagnosis not present

## 2022-03-28 DIAGNOSIS — M542 Cervicalgia: Secondary | ICD-10-CM | POA: Diagnosis not present

## 2022-03-28 DIAGNOSIS — M5442 Lumbago with sciatica, left side: Secondary | ICD-10-CM | POA: Diagnosis not present

## 2022-03-28 DIAGNOSIS — M546 Pain in thoracic spine: Secondary | ICD-10-CM | POA: Diagnosis not present

## 2022-03-28 DIAGNOSIS — M9901 Segmental and somatic dysfunction of cervical region: Secondary | ICD-10-CM | POA: Diagnosis not present

## 2022-03-28 MED ORDER — OMEPRAZOLE 40 MG PO CPDR: DELAYED_RELEASE_CAPSULE | ORAL | 3 refills | Status: DC

## 2022-03-28 NOTE — Telephone Encounter (Signed)
Inbound call from patient needing a refill for Omeprazole medication. Please send prescription to [facility] . Patient also states if pharmacy can be notified to put medication into a easy open container due to him having arthritis. Please give a call if needing to further advise.  Thank you

## 2022-03-28 NOTE — Telephone Encounter (Signed)
Omeprazole refilled, up to date on visits.

## 2022-03-29 ENCOUNTER — Encounter: Payer: Self-pay | Admitting: Internal Medicine

## 2022-03-30 NOTE — Telephone Encounter (Signed)
Pt stated that he is having increased issues with his Hemorrhoids: Pt was recommended to use wet wipes instead of toilet paper that is increasing the discomfort: Pt was scheduled for an office visit with Earnie Larsson PA on 04/05/2022 at 1:30: Pt made aware: Pt verbalized understanding with all questions answered.

## 2022-04-02 DIAGNOSIS — G834 Cauda equina syndrome: Secondary | ICD-10-CM | POA: Diagnosis not present

## 2022-04-04 ENCOUNTER — Other Ambulatory Visit: Payer: Self-pay | Admitting: Internal Medicine

## 2022-04-05 ENCOUNTER — Ambulatory Visit: Payer: Medicare PPO | Admitting: Physician Assistant

## 2022-04-27 DIAGNOSIS — Z9889 Other specified postprocedural states: Secondary | ICD-10-CM | POA: Diagnosis not present

## 2022-04-27 DIAGNOSIS — C3432 Malignant neoplasm of lower lobe, left bronchus or lung: Secondary | ICD-10-CM | POA: Diagnosis not present

## 2022-04-27 DIAGNOSIS — Z87891 Personal history of nicotine dependence: Secondary | ICD-10-CM | POA: Diagnosis not present

## 2022-04-27 DIAGNOSIS — Z85118 Personal history of other malignant neoplasm of bronchus and lung: Secondary | ICD-10-CM | POA: Diagnosis not present

## 2022-04-27 DIAGNOSIS — J8410 Pulmonary fibrosis, unspecified: Secondary | ICD-10-CM | POA: Diagnosis not present

## 2022-04-27 DIAGNOSIS — Z08 Encounter for follow-up examination after completed treatment for malignant neoplasm: Secondary | ICD-10-CM | POA: Diagnosis not present

## 2022-04-27 DIAGNOSIS — R918 Other nonspecific abnormal finding of lung field: Secondary | ICD-10-CM | POA: Diagnosis not present

## 2022-05-02 DIAGNOSIS — M9901 Segmental and somatic dysfunction of cervical region: Secondary | ICD-10-CM | POA: Diagnosis not present

## 2022-05-02 DIAGNOSIS — M546 Pain in thoracic spine: Secondary | ICD-10-CM | POA: Diagnosis not present

## 2022-05-02 DIAGNOSIS — M542 Cervicalgia: Secondary | ICD-10-CM | POA: Diagnosis not present

## 2022-05-02 DIAGNOSIS — M9905 Segmental and somatic dysfunction of pelvic region: Secondary | ICD-10-CM | POA: Diagnosis not present

## 2022-05-02 DIAGNOSIS — M5442 Lumbago with sciatica, left side: Secondary | ICD-10-CM | POA: Diagnosis not present

## 2022-05-02 DIAGNOSIS — M9902 Segmental and somatic dysfunction of thoracic region: Secondary | ICD-10-CM | POA: Diagnosis not present

## 2022-05-07 DIAGNOSIS — M5417 Radiculopathy, lumbosacral region: Secondary | ICD-10-CM | POA: Diagnosis not present

## 2022-05-09 ENCOUNTER — Telehealth: Payer: Self-pay

## 2022-05-09 NOTE — Telephone Encounter (Signed)
Patient is needing    6 left  [place] [place] - [place] Phone:  [phone]  Fax:  [phone]

## 2022-05-10 DIAGNOSIS — G834 Cauda equina syndrome: Secondary | ICD-10-CM | POA: Diagnosis not present

## 2022-05-11 ENCOUNTER — Encounter: Payer: Medicare PPO | Admitting: Internal Medicine

## 2022-06-18 ENCOUNTER — Ambulatory Visit: Payer: Medicare PPO | Attending: Cardiovascular Disease | Admitting: Cardiovascular Disease

## 2022-06-18 ENCOUNTER — Ambulatory Visit: Payer: Medicare PPO | Attending: Cardiovascular Disease

## 2022-06-18 DIAGNOSIS — G8929 Other chronic pain: Secondary | ICD-10-CM | POA: Diagnosis not present

## 2022-06-18 DIAGNOSIS — Z8249 Family history of ischemic heart disease and other diseases of the circulatory system: Secondary | ICD-10-CM

## 2022-06-18 DIAGNOSIS — Z85118 Personal history of other malignant neoplasm of bronchus and lung: Secondary | ICD-10-CM

## 2022-06-18 DIAGNOSIS — R002 Palpitations: Secondary | ICD-10-CM

## 2022-06-18 DIAGNOSIS — E785 Hyperlipidemia, unspecified: Secondary | ICD-10-CM | POA: Diagnosis not present

## 2022-06-18 DIAGNOSIS — M5442 Lumbago with sciatica, left side: Secondary | ICD-10-CM

## 2022-06-18 NOTE — Progress Notes (Unsigned)
Enrolled for [employer] to mail a ZIO XT long term holter monitor to the patients address on file.  

## 2022-06-18 NOTE — Patient Instructions (Signed)
Medication Instructions:  Your physician recommends that you continue on your current medications as directed. Please refer to the Current Medication list given to you today.  *If you need a refill on your cardiac medications before your next appointment, please call your pharmacy*  Lab Work: Your physician recommends that you return for lab work TODAY:  CMP FLP MG TSH If you have labs (blood work) drawn today and your tests are completely normal, you will receive your results only by: [place] (if you have MyChart) OR A paper copy in the mail If you have any lab test that is abnormal or we need to change your treatment, we will call you to review the results.  Testing/Procedures: Your physician has requested that you have an abdominal aorta duplex. During this test, an ultrasound is used to evaluate the aorta. Allow 30 minutes for this exam. Do not eat after midnight the day before and avoid carbonated beverages   Dr. Claiborne Billings has ordered a CT coronary calcium score.   Test locations:  [place]   This is $99 out of pocket.   Coronary CalciumScan A coronary calcium scan is an imaging test used to look for deposits of calcium and other fatty materials (plaques) in the inner lining of the blood vessels of the heart (coronary arteries). These deposits of calcium and plaques can partly clog and narrow the coronary arteries without producing any symptoms or warning signs. This puts a person at risk for a heart attack. This test can detect these deposits before symptoms develop. Tell a health care provider about: Any allergies you have. All medicines you are taking, including vitamins, herbs, eye drops, creams, and over-the-counter medicines. Any problems you or family members have had with anesthetic medicines. Any blood disorders you have. Any surgeries you have had. Any medical conditions you have. Whether you are pregnant or may be  pregnant. What are the risks? Generally, this is a safe procedure. However, problems may occur, including: Harm to a pregnant woman and her unborn baby. This test involves the use of radiation. Radiation exposure can be dangerous to a pregnant woman and her unborn baby. If you are pregnant, you generally should not have this procedure done. Slight increase in the risk of cancer. This is because of the radiation involved in the test. What happens before the procedure? No preparation is needed for this procedure. What happens during the procedure? You will undress and remove any jewelry around your neck or chest. You will put on a hospital gown. Sticky electrodes will be placed on your chest. The electrodes will be connected to an electrocardiogram (ECG) machine to record a tracing of the electrical activity of your heart. A CT scanner will take pictures of your heart. During this time, you will be asked to lie still and hold your breath for 2-3 seconds while a picture of your heart is being taken. The procedure may vary among health care providers and hospitals. What happens after the procedure? You can get dressed. You can return to your normal activities. It is up to you to get the results of your test. Ask your health care provider, or the department that is doing the test, when your results will be ready. Summary A coronary calcium scan is an imaging test used to look for deposits of calcium and other fatty materials (plaques) in the inner lining of the blood vessels of the heart (coronary arteries). Generally, this is a safe procedure. Tell your health  care provider if you are pregnant or may be pregnant. No preparation is needed for this procedure. A CT scanner will take pictures of your heart. You can return to your normal activities after the scan is done. This information is not intended to replace advice given to you by your health care provider. Make sure you discuss any questions  you have with your health care provider. Document Released: 12/29/2007 Document Revised: 05/21/2016 Document Reviewed: 05/21/2016 Elsevier Interactive Patient Education  2017 [place] Term Monitor Instructions  Your physician has requested you wear a ZIO patch monitor for 14 days.  This is a single patch monitor. [employer] supplies one patch monitor per enrollment. Additional stickers are not available. Please do not apply patch if you will be having a Nuclear Stress Test,  Echocardiogram, Cardiac CT, MRI, or Chest Xray during the period you would be wearing the  monitor. The patch cannot be worn during these tests. You cannot remove and re-apply the  ZIO XT patch monitor.  Your ZIO patch monitor will be mailed 3 day USPS to your address on file. It may take 3-5 days  to receive your monitor after you have been enrolled.  Once you have received your monitor, please review the enclosed instructions. Your monitor  has already been registered assigning a specific monitor serial # to you.  Billing and Patient Assistance Program Information  We have supplied [employer] with any of your insurance information on file for billing purposes. [employer] offers a sliding scale Patient Assistance Program for patients that do not have  insurance, or whose insurance does not completely cover the cost of the ZIO monitor.  You must apply for the Patient Assistance Program to qualify for this discounted rate.  To apply, please call [employer] at [phone], select option 4, select option 2, ask to apply for  Patient Assistance Program. Theodore Demark will ask your household income, and how many people  are in your household. They will quote your out-of-pocket cost based on that information.  [employer] will also be able to set up a 13-month, interest-free payment plan if needed.  Applying the monitor   Shave hair from upper left chest.  Hold abrader disc by orange tab. Rub abrader in 40 strokes  over the upper left chest as  indicated in your monitor instructions.  Clean area with 4 enclosed alcohol pads. Let dry.  Apply patch as indicated in monitor instructions. Patch will be placed under collarbone on left  side of chest with arrow pointing upward.  Rub patch adhesive wings for 2 minutes. Remove white label marked "1". Remove the white  label marked "2". Rub patch adhesive wings for 2 additional minutes.  While looking in a mirror, press and release button in center of patch. A small green light will  flash 3-4 times. This will be your only indicator that the monitor has been turned on.  Do not shower for the first 24 hours. You may shower after the first 24 hours.  Press the button if you feel a symptom. You will hear a small click. Record Date, Time and  Symptom in the Patient Logbook.  When you are ready to remove the patch, follow instructions on the last 2 pages of Patient  Logbook. Stick patch monitor onto the last page of Patient Logbook.  Place Patient Logbook in the blue and white box. Use locking tab on box and tape box closed  securely. The blue and white box has  prepaid postage on it. Please place it in the mailbox as  soon as possible. Your physician should have your test results approximately 7 days after the  monitor has been mailed back to [facility].  Call [facility] at [phone] if you have questions regarding  your ZIO XT patch monitor. Call them immediately if you see an orange light blinking on your  monitor.  If your monitor falls off in less than 4 days, contact our Monitor department at [phone].  If your monitor becomes loose or falls off after 4 days call [employer] at [phone] for  suggestions on securing your monitor   Follow-Up: At [facility], you and your health needs are our priority.  As part of our continuing mission to provide you with exceptional heart care, we have created designated Provider Care  Teams.  These Care Teams include your primary Cardiologist (physician) and Advanced Practice Providers (APPs -  Physician Assistants and Nurse Practitioners) who all work together to provide you with the care you need, when you need it.   Your next appointment:   6-8 week(s)  The format for your next appointment:   In Person  Provider:   Shelva Majestic, MD     Other Instructions   Important Information About Sugar      Your physician recommends that you continue on your current medications as directed. Please refer to the Current Medication list given to you today.

## 2022-06-18 NOTE — Progress Notes (Signed)
Cardiology Office Note    Date:  06/30/2022   ID:  RIP HAWES, DOB 03/12/1946, MRN [id number]  PCP:  [name], MD  Cardiologist:  Shelva Majestic, MD   Chief Complaint  Patient presents with   New Patient (Initial Visit)   Shortness of Breath    New cardiology evaluation referred by Dr. Posey Pronto for palpitations and shortness of breath.  History of Present Illness:  ROCKWELL ZENTZ is a 76 y.o. male who had remotely seen Dr. Johnsie Cancel in 2018 for chest pain.  He has a history of chronic GERD, arthritis with back pain issues.  It was felt his chest pain was noncardiac in etiology.  Follow-up GI evaluation was recommended.  Recently, he has been followed by Dr. Posey Pronto.  He was seen by him in July 2003 with chronic intermittent low back pain and weakness of his left lower extremity.  He has been evaluated at the [facility] spine and pain management clinic.  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  He was last evaluated by Dr. Erie Noe on April 27, 2022.  CT of his chest revealed minimal soft tissue thickening at the resection margin of his left lower lobe sublobar resection.  There was no evidence for recurrent or metastatic disease.  There was subpleural reticulation concerning for mild fibrosis/interstitial lung disease.  Recently, Mr. Blaisdell has noticed 3 weeks of sudden episodes of palpitations.  He admits to a dry cough.  He had a sister who was undergone CABG revascularization in the mother who had aortic aneurysm.  Mr. Baik smoked from [age] but quit smoking in 1976.  Through most of his work years he worked in [employer], [employer]/video with clay silica dust which may be the etiology for his lung cancer.  He denies any chest tightness or pressure.  He  admits to transient episodes of skipped beats that may last 2 minutes.  At times he walks with a walker for balance.  When he walks his dog he denies chest pain.  He presents for evaluation.   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   anterior cer disc and fusion with plates C3-4 and D4-3 at [facility]   Constipation    Encounter for general adult medical examination with abnormal findings 06/27/2022   Enlarged prostate    GERD (gastroesophageal reflux disease)    Hemorrhoids    History of kidney stones    passed stones   History of lumbar laminectomy 05/12/2020   open L2-L5 at [facility]   Hx of colonic polyp 05/29/2018   05/2018 cecal ssp   Lung nodule 02/2021   Maxillary sinus polyp    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   Prolapsed internal hemorrhoids, grade 3 09/10/2006   Qualifier: Diagnosis of  By: Jonna Munro MD, Cornelius     Salivary gland enlargement    Sleep apnea 10 years ago   declined CPAP  Stroke ([place]) 15-20 years ago    Past Surgical History:  Procedure Laterality Date   BRONCHIAL BIOPSY  05/16/2021   Procedure: BRONCHIAL BIOPSIES;  Surgeon: Garner Nash, DO;  Location: [place] [facility];  Service: Pulmonary;;   BRONCHIAL BRUSHINGS  05/16/2021   Procedure: BRONCHIAL BRUSHINGS;  Surgeon: Garner Nash, DO;  Location: [place] [facility];  Service: Pulmonary;;   BRONCHIAL NEEDLE ASPIRATION BIOPSY  05/16/2021   Procedure: BRONCHIAL NEEDLE ASPIRATION BIOPSIES;  Surgeon: Garner Nash, DO;  Location: [place] [facility];  Service: Pulmonary;;   CERVICAL SPINE SURGERY  04/10/2020   C3-C4 with pin at [facility]   COLONOSCOPY     FIDUCIAL MARKER PLACEMENT  05/16/2021   Procedure: FIDUCIAL MARKER PLACEMENT;  Surgeon: Garner Nash,  DO;  Location: [place] [facility];  Service: Pulmonary;;   HEMORRHOID BANDING     LUMBAR LAMINECTOMY  03/08/2010   L2-3, cauda equina syndrome   lumbar laminectomy  05/12/2020   [facility]   SPINE SURGERY     UPPER GI ENDOSCOPY     VIDEO BRONCHOSCOPY WITH ENDOBRONCHIAL NAVIGATION Left 05/16/2021   Procedure: VIDEO BRONCHOSCOPY WITH ENDOBRONCHIAL NAVIGATION;  Surgeon: Garner Nash, DO;  Location: [place];  Service: Pulmonary;  Laterality: Left;  ION w/ fiducial placement   VIDEO BRONCHOSCOPY WITH RADIAL ENDOBRONCHIAL ULTRASOUND  05/16/2021   Procedure: VIDEO BRONCHOSCOPY WITH RADIAL ENDOBRONCHIAL ULTRASOUND;  Surgeon: Garner Nash, DO;  Location: [place] [facility]COPY;  Service: Pulmonary;;   wisdom teeth ext      Current Medications: Outpatient Medications Prior to Visit  Medication Sig Dispense Refill   Melatonin 5 MG/ML LIQD Take 5 mg by mouth daily.     aspirin 81 MG EC tablet Take 81 mg by mouth every evening.     finasteride (PROSCAR) 5 MG tablet Take 1 tablet (5 mg total) by mouth daily. 90 tablet 3   hydrocortisone (ANUSOL-HC) 2.5 % rectal cream Place 1 application. rectally 2 (two) times daily. (Patient taking differently: Place 1 application  rectally 3 times/day as needed-between meals & bedtime.) 30 g 1   Magnesium 400 MG CAPS Take 400 mg by mouth 2 (two) times daily. asporotate     metroNIDAZOLE (METROGEL) 0.75 % gel Apply to face daily for rosacea 45 g 4   Omega-3 Fatty Acids (FISH OIL) 1200 MG CAPS Take 1,200 mg by mouth daily.     omeprazole (PRILOSEC) 40 MG capsule TAKE (1) CAPSULE BY MOUTH ONCE DAILY. 90 capsule 3   polyethylene glycol powder (GLYCOLAX/MIRALAX) 17 GM/SCOOP powder Take 0.5 Containers by mouth daily as needed for moderate constipation or mild constipation.     Probiotic Product (PROBIOTIC DAILY PO) Take 1 tablet by mouth 2 (two) times daily.     senna-docusate (SENOKOT-S) 8.6-50 MG tablet Take 2 tablets by mouth at bedtime as needed for mild constipation.      silodosin (RAPAFLO) 8 MG CAPS capsule Take 1 capsule (8 mg total) by mouth 2 (two) times daily. 30 capsule 11   TURMERIC PO Take 750 mg by mouth daily. curamed curcumin     cyclobenzaprine (FLEXERIL) 10 MG tablet Take 1 tablet (10 mg total) by mouth at bedtime. 30 tablet 2   gabapentin (NEURONTIN) 300 MG capsule Take 1 capsule (300 mg total) by mouth 2 (two) times daily. 180 capsule 1   Ginger, Zingiber officinalis, (GINGER PO) Take 4 g by mouth daily.     melatonin 3 MG TABS tablet Take 6 mg by mouth at bedtime.     silodosin (RAPAFLO)  8 MG CAPS capsule TAKE (1) CAPSULE BY MOUTH ONCE DAILY WITH BREAKFAST 90 capsule 0   Tart Cherry 1200 MG CAPS Take 3,600 capsules by mouth daily.     No facility-administered medications prior to visit.     Allergies:   Patient has no known allergies.   Social History   Socioeconomic History   Marital status: Married    Spouse name: Not on file   Number of children: 0   Years of education: Not on file   Highest education level: Not on file  Occupational History   Occupation: retired  Tobacco Use   Smoking status: Former    Packs/day: 0.25    Years: 9.00    Total pack years: 2.25    Types: Cigarettes    Start date: 10/15/1965    Quit date: 10/16/1974    Years since quitting: 47.7    Passive exposure: Past   Smokeless tobacco: Never  Vaping Use   Vaping Use: Never used  Substance and Sexual Activity   Alcohol use: Not Currently    Comment: rarely wine   Drug use: No   Sexual activity: Not Currently  Other Topics Concern   Not on file  Social History Narrative   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   Social Determinants of Health   Financial Resource Strain: Low Risk  (01/31/2022)   Overall Financial Resource Strain (CARDIA)    Difficulty of Paying Living  Expenses: Not hard at all  Food Insecurity: No Food Insecurity (01/31/2022)   Hunger Vital Sign    Worried About Running Out of Food in the Last Year: Never true    Ran Out of Food in the Last Year: Never true  Transportation Needs: No Transportation Needs (01/31/2022)   PRAPARE - Hydrologist (Medical): No    Lack of Transportation (Non-Medical): No  Physical Activity: Sufficiently Active (01/31/2022)   Exercise Vital Sign    Days of Exercise per Week: 7 days    Minutes of Exercise per Session: 60 min  Stress: No Stress Concern Present (01/31/2022)   [place]    Feeling of Stress : Not at all  Social Connections: Moderately Isolated (01/31/2022)   Social Connection and Isolation Panel [NHANES]    Frequency of Communication with Friends and Family: More than three times a week    Frequency of Social Gatherings with Friends and Family: More than three times a week    Attends Religious Services: Never    Marine scientist or Organizations: No    Attends Archivist Meetings: Never    Marital Status: Married    Socially he was born in [place] near [place].  He is married for 53 years.  No children.  He is retired.  He does not drink alcohol.  He walks approximately 6 days a week.  His wife was diagnosed with ovarian cancer and is undergoing chemotherapy at [facility].  Family History:  The patient's family history includes Aortic aneurysm in his mother, sister, sister, and sister; Arthritis in his father; CVA in his sister; Early death in his sister; Heart attack in his father; Varicose Veins in his mother.  ROS General: Negative; No fevers, chills, or night sweats;  HEENT: Negative; No changes in vision or hearing, sinus congestion, difficulty swallowing Pulmonary: Negative; No cough, wheezing, shortness of breath, hemoptysis Cardiovascular: Negative; No chest  pain, presyncope, syncope, palpitations GI: Negative; No nausea, vomiting, diarrhea, or abdominal pain GU: Negative; No dysuria, hematuria, or difficulty voiding Musculoskeletal: Sciatica symptoms left leg Hematologic/Oncology: Negative; no easy bruising, bleeding Endocrine: Negative; no heat/cold intolerance; no diabetes Neuro: Negative; no changes in balance, headaches Skin: Negative; No rashes or skin lesions Psychiatric: Negative; No behavioral problems, depression Sleep: Negative; No snoring, daytime sleepiness, hypersomnolence, bruxism, restless legs, hypnogognic hallucinations, no cataplexy Other comprehensive 14 point system review is negative.   PHYSICAL EXAM:   VS:  BP 130/78 (BP Location: Left Arm, Patient Position: Sitting, Cuff Size: Normal)   Pulse (!) 59   Ht 6' (1.829 m)   Wt 185 lb (83.9 kg)   BMI 25.09 kg/m     Repeat blood pressure by me was 130/74  Wt Readings from Last 3 Encounters:  06/27/22 187 lb (84.8 kg)  06/18/22 185 lb (83.9 kg)  01/31/22 185 lb 9.6 oz (84.2 kg)    General: Alert, oriented, no distress.  Skin: normal turgor, no rashes, warm and dry HEENT: Normocephalic, atraumatic. Pupils equal round and reactive to light; sclera anicteric; extraocular muscles intact;  Nose without nasal septal hypertrophy Mouth/Parynx benign; Mallinpatti scale 3 Neck: No JVD, no carotid bruits; normal carotid upstroke Lungs: clear to ausculatation and percussion; no wheezing or rales Chest wall: without tenderness to palpitation Heart: PMI not displaced, RRR, s1 s2 normal, 1/6 systolic murmur, no diastolic murmur, no rubs, gallops, thrills, or heaves Abdomen: soft, nontender; no hepatosplenomehaly, BS+; abdominal aorta nontender and not dilated by palpation. Back: no CVA tenderness Pulses 2+ Musculoskeletal: full range of motion, normal strength, no joint deformities Extremities: no clubbing cyanosis or edema, Homan's sign negative  Neurologic: grossly nonfocal;  Cranial nerves grossly wnl Psychologic: Normal mood and affect   Studies/Labs Reviewed:   December 4., 2023 ECG (independently read by me): Sinus bradycardia at 59 with mild sinus arrythmia  Recent Labs:    Latest Ref Rng & Units 06/18/2022   11:45 AM 05/16/2021    7:47 AM 09/28/2019    2:18 PM  BMP  Glucose 70 - 99 mg/dL 95  97  78   BUN 8 - 27 mg/dL _0 Creatinine 0.76 - 1.27 mg/dL 0.68  0.68  0.69   BUN/Creat Ratio 10 - 24 19     Sodium 134 - 144 mmol/L 140  137  139   Potassium 3.5 - 5.2 mmol/L 4.8  3.8  3.9   Chloride 96 - 106 mmol/L 102  105  102   CO2 20 - 29 mmol/L _1 Calcium 8.6 - 10.2 mg/dL 9.3  8.9  9.2         Latest Ref Rng & Units 06/18/2022   11:45 AM 09/28/2019    2:18 PM 02/10/2009    8:17 PM  Hepatic Function  Total Protein 6.0 - 8.5 g/dL 6.8  6.9  6.8   Albumin 3.8 - 4.8 g/dL 4.2  4.2  4.2   AST 0 - 40 IU/L _2 ALT 0 - 44 IU/L _3 Alk Phosphatase 44 - 121 IU/L 62  44  41   Total Bilirubin 0.0 - 1.2 mg/dL 0.3  0.6  1.1        Latest Ref Rng & Units 05/16/2021    7:47 AM 09/28/2019    2:18 PM 06/13/2018   10:20 AM  CBC  WBC 4.0 - 10.5 K/uL 5.0  5.9  13.8   Hemoglobin 13.0 - 17.0 g/dL 12.7  12.7  12.9   Hematocrit 39.0 - 52.0 % 40.5  39.3  41.4   Platelets 150 - 400 K/uL 201  207.0  212    Lab Results  Component Value Date   MCV 81.3 05/16/2021   MCV 79.2 09/28/2019   MCV 79.6 (L) 06/13/2018   Lab Results  Component Value Date   TSH 1.460 06/18/2022   Lab Results  Component Value Date   HGBA1C 5.6 09/29/2019     BNP No results found for: "BNP"  ProBNP No results found for: "PROBNP"   Lipid Panel     Component Value Date/Time   CHOL 143 06/18/2022 1145   TRIG 68 06/18/2022 1145   HDL 66 06/18/2022 1145   CHOLHDL 2.2 06/18/2022 1145   CHOLHDL 2.9 Ratio 02/10/2009 2017   VLDL 15 02/10/2009 2017   LDLCALC 63 06/18/2022 1145   LABVLDL 14 06/18/2022 1145     RADIOLOGY: VAS [place] AAA  DUPLEX  Result Date: 06/25/2022 ABDOMINAL AORTA STUDY Patient Name:  AVION KUTZER  Date of Exam:   06/25/2022 Medical Rec #: [id number]         Accession #:    [id number] Date of Birth: [birth year]         Patient Gender: M Patient Age:   72 years Exam Location:  [facility] Procedure:      VAS [place] AAA DUPLEX Referring Phys: Shelva Majestic --------------------------------------------------------------------------------  Indications: 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              Patient has strong family history of AAA. Risk Factors: Hypertension, past history of smoking, prior CVA. Limitations: Air/bowel gas.  Comparison Study: Prior aorta duplex exam on 04/07/2019 showed no AAA, bilateral                   common iliac artery ectasia right CIA 1.6 x 1.6 cm and left                   CIA 1.4 x 1.4 cm. Performing Technologist: Salvadore Dom RVT, RDCS (AE), RDMS  Examination Guidelines: A complete evaluation includes B-mode imaging, spectral Doppler, color Doppler, and power Doppler as needed of all accessible portions of each vessel. Bilateral testing is considered an integral part of a complete examination. Limited examinations for reoccurring indications may be performed as noted.  Abdominal Aorta Findings: +-------------+-------+----------+----------+---------+--------+--------+ Location     AP (cm)Trans (cm)PSV (cm/s)Waveform ThrombusComments +-------------+-------+----------+----------+---------+--------+--------+ Proximal     2.00   2.40      104       biphasic                  +-------------+-------+----------+----------+---------+--------+--------+ Mid          2.10   2.70      69        biphasic                  +-------------+-------+----------+----------+---------+--------+--------+ Distal       2.10   2.70      73        biphasic                  +-------------+-------+----------+----------+---------+--------+--------+  RT CIA Prox  1.5    1.6        127       biphasic                  +-------------+-------+----------+----------+---------+--------+--------+ RT EIA Prox  1.3    1.4       76        biphasic                  +-------------+-------+----------+----------+---------+--------+--------+ RT EIA Distal1.3              107       biphasic                  +-------------+-------+----------+----------+---------+--------+--------+ LT CIA Prox  1.5    1.6       99        triphasic                 +-------------+-------+----------+----------+---------+--------+--------+ LT EIA Prox  1.8    1.7       44        biphasic                  +-------------+-------+----------+----------+---------+--------+--------+ LT EIA Distal1.2              75        biphasic                  +-------------+-------+----------+----------+---------+--------+--------+ IVC/Iliac Findings: +--------+------+--------+--------+   IVC   PatentThrombusComments +--------+------+--------+--------+ IVC Proxpatent                 +--------+------+--------+--------+    Summary: Abdominal Aorta: No evidence of an abdominal aortic aneurysm was visualized. There is evidence of abnormal dilation of the Right Common Iliac artery, Left Common Iliac artery, Right External Iliac artery and Left External Iliac artery. The largest aortic  measurement is 2.7 cm. Stenosis: No evidence of stenosis seen. IVC/Iliac: There is no evidence of thrombus involving the IVC.  *See table(s) above for measurements and observations. Suggest follow up study in PRN.  Electronically signed by Quay Burow MD on 06/25/2022 at 2:28:37 PM.    Final    ECHOCARDIOGRAM COMPLETE  Result Date: 06/22/2022    ECHOCARDIOGRAM REPORT   Patient Name:   JAMARCUS LADUKE Date of Exam: 06/22/2022 Medical Rec #:  [id number]        Height:       72.0 in Accession #:    [id number]       Weight:       185.0 lb Date of Birth:  1946/03/23        BSA:          2.061 m Patient Age:    47  years         BP:           130/78 mmHg Patient Gender: M                HR:           64 bpm. Exam Location:  [place] Procedure: 2D Echo, Cardiac Doppler and Color Doppler Indications:    R00.2 Palpitations  History:        Patient has prior history of Echocardiogram examinations, most                 recent 11/16/2015. Palpitations, Family history of aortic  aneurysm; Risk Factors:Dyslipidemia and Former Smoker.  Sonographer:    Coralyn Helling RDCS Referring Phys: [place] Comments: Image acquisition challenging due to respiratory motion. IMPRESSIONS  1. Left ventricular ejection fraction, by estimation, is 60 to 65%. The left ventricle has normal function. The left ventricle has no regional wall motion abnormalities. There is mild concentric left ventricular hypertrophy. Left ventricular diastolic parameters were normal.  2. Right ventricular systolic function is normal. The right ventricular size is normal. There is normal pulmonary artery systolic pressure. The estimated right ventricular systolic pressure is 40.9 mmHg.  3. Left atrial size was moderately dilated.  4. The mitral valve is normal in structure. Trivial mitral valve regurgitation. There is mild late systolic prolapse of the middle scallop of the posterior leaflet of the mitral valve.  5. The aortic valve is tricuspid. There is mild calcification of the aortic valve. There is mild thickening of the aortic valve. Aortic valve regurgitation is mild. Aortic valve sclerosis/calcification is present, without any evidence of aortic stenosis.  6. Aortic dilatation noted. There is borderline dilatation of the aortic root, measuring 38 mm.  7. The inferior vena cava is normal in size with greater than 50% respiratory variability, suggesting right atrial pressure of 3 mmHg. FINDINGS  Left Ventricle: Left ventricular ejection fraction, by estimation, is 60 to 65%. The left ventricle has normal function. The left  ventricle has no regional wall motion abnormalities. The left ventricular internal cavity size was normal in size. There is  mild concentric left ventricular hypertrophy. Left ventricular diastolic parameters were normal. Normal left ventricular filling pressure. Right Ventricle: The right ventricular size is normal. No increase in right ventricular wall thickness. Right ventricular systolic function is normal. There is normal pulmonary artery systolic pressure. The tricuspid regurgitant velocity is 2.17 m/s, and  with an assumed right atrial pressure of 3 mmHg, the estimated right ventricular systolic pressure is 73.5 mmHg. Left Atrium: Left atrial size was moderately dilated. Right Atrium: Right atrial size was normal in size. Pericardium: There is no evidence of pericardial effusion. Mitral Valve: The mitral valve is normal in structure. There is mild late systolic prolapse of the middle scallop of the posterior leaflet of the mitral valve. Trivial mitral valve regurgitation. Tricuspid Valve: The tricuspid valve is normal in structure. Tricuspid valve regurgitation is trivial. Aortic Valve: The aortic valve is tricuspid. There is mild calcification of the aortic valve. There is mild thickening of the aortic valve. Aortic valve regurgitation is mild. Aortic regurgitation PHT measures 644 msec. Aortic valve sclerosis/calcification is present, without any evidence of aortic stenosis. Pulmonic Valve: The pulmonic valve was grossly normal. Pulmonic valve regurgitation is not visualized. Aorta: Aortic dilatation noted. There is borderline dilatation of the aortic root, measuring 38 mm. Venous: The inferior vena cava is normal in size with greater than 50% respiratory variability, suggesting right atrial pressure of 3 mmHg. IAS/Shunts: No atrial level shunt detected by color flow Doppler.  LEFT VENTRICLE PLAX 2D LVIDd:         4.50 cm   Diastology LVIDs:         2.90 cm   LV e' medial:    11.70 cm/s LV PW:         1.30  cm   LV E/e' medial:  6.3 LV IVS:        1.30 cm   LV e' lateral:   10.30 cm/s LVOT diam:     2.30 cm   LV E/e'  lateral: 7.1 LV SV:         103 LV SV Index:   50 LVOT Area:     4.15 cm  RIGHT VENTRICLE             IVC RV S prime:     30.20 cm/s  IVC diam: 1.10 cm TAPSE (M-mode): 1.7 cm RVSP:           21.8 mmHg LEFT ATRIUM              Index        RIGHT ATRIUM           Index LA diam:        4.80 cm  2.33 cm/m   RA Pressure: 3.00 mmHg LA Vol (A2C):   100.0 ml 48.52 ml/m  RA Area:     16.40 cm LA Vol (A4C):   70.0 ml  33.96 ml/m  RA Volume:   44.50 ml  21.59 ml/m LA Biplane Vol: 87.4 ml  42.40 ml/m  AORTIC VALVE LVOT Vmax:   105.00 cm/s LVOT Vmean:  72.300 cm/s LVOT VTI:    0.247 m AI PHT:      644 msec  AORTA Ao Root diam: 3.80 cm Ao Asc diam:  3.20 cm MITRAL VALVE               TRICUSPID VALVE MV Area (PHT): 2.88 cm    TR Peak grad:   18.8 mmHg MV Decel Time: 263 msec    TR Vmax:        217.00 cm/s MV E velocity: 73.40 cm/s  Estimated RAP:  3.00 mmHg MV A velocity: 69.20 cm/s  RVSP:           21.8 mmHg MV E/A ratio:  1.06                            SHUNTS                            Systemic VTI:  0.25 m                            Systemic Diam: 2.30 cm Dani Gobble Croitoru MD Electronically signed by Sanda Klein MD Signature Date/Time: 06/22/2022/2:21:46 PM    Final      Additional studies/ records that were reviewed today include:   I reviewed the records from [facility] system as well as Dr. Posey Pronto    ASSESSMENT:    1. Palpitations   2. Family history of aortic aneurysm   3. Hyperlipidemia, unspecified hyperlipidemia type   4. History of lung cancer   5. Chronic left-sided low back pain with left-sided sciatica     PLAN:  Mr. Gerrick "Zenia Resides" Lorimer is a 76 year old male who has a history of remote tobacco use and smoked from 1968 in 1976.  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  He subsequently was found to have adenocarcinoma of his left lower lung and  subsequently underwent lobar resection.  He has family history for coronary artery disease with a sister having undergone CABG revascularization.  His mother also had abdominal aortic aneurysm recently, he has noticed a dry cough.  He also experiences 3 weeks of episodes of palpitations.  These palpitations typically last for approximately 2 minutes and resolved.  There is rare  dizziness associated with it.  Most of the time he does not have dizziness.  He is unaware of any blood pressure issues.  He has had continued problems with his low back and has sciatica down his left leg.  Presently, I am recommending he undergo initial laboratory with a comprehensive metabolic panel, magnesium level, TSH, CBC and lipid studies.  I am scheduling him for a 2D echo Doppler study to evaluate systolic and diastolic function.  I am recommending he undergo an initial calcium score to assess for coronary calcification and aortic atherosclerosis.  I am also recommending he undergo an abdominal aortic ultrasound with his family history for aneurysm.  I am scheduling him to wear a Zio patch monitor for 2 weeks.  His GERD is followed by Dr. Carlean Purl and he continues to be on omeprazole.  He is on finasteride for BPH.  Remotely he was told that he may have mild sleep apnea but he believes he is sleeping well.  An Epworth Sleepiness Scale score was calculated in the office today and this endorsed at 3 arguing against excessive daytime sleepiness.  He presents for his initial cardiology evaluation   Medication Adjustments/Labs and Tests Ordered: Current medicines are reviewed at length with the patient today.  Concerns regarding medicines are outlined above.  Medication changes, Labs and Tests ordered today are listed in the Patient Instructions below. Patient Instructions  Medication Instructions:  Your physician recommends that you continue on your current medications as directed. Please refer to the Current Medication list given  to you today.  *If you need a refill on your cardiac medications before your next appointment, please call your pharmacy*  Lab Work: Your physician recommends that you return for lab work TODAY:  CMP FLP MG TSH If you have labs (blood work) drawn today and your tests are completely normal, you will receive your results only by: [place] (if you have MyChart) OR A paper copy in the mail If you have any lab test that is abnormal or we need to change your treatment, we will call you to review the results.  Testing/Procedures: Your physician has requested that you have an abdominal aorta duplex. During this test, an ultrasound is used to evaluate the aorta. Allow 30 minutes for this exam. Do not eat after midnight the day before and avoid carbonated beverages   Dr. Claiborne Billings has ordered a CT coronary calcium score.   Test locations:  [place]   This is $99 out of pocket.   Coronary CalciumScan A coronary calcium scan is an imaging test used to look for deposits of calcium and other fatty materials (plaques) in the inner lining of the blood vessels of the heart (coronary arteries). These deposits of calcium and plaques can partly clog and narrow the coronary arteries without producing any symptoms or warning signs. This puts a person at risk for a heart attack. This test can detect these deposits before symptoms develop. Tell a health care provider about: Any allergies you have. All medicines you are taking, including vitamins, herbs, eye drops, creams, and over-the-counter medicines. Any problems you or family members have had with anesthetic medicines. Any blood disorders you have. Any surgeries you have had. Any medical conditions you have. Whether you are pregnant or may be pregnant. What are the risks? Generally, this is a safe procedure. However, problems may occur, including: Harm to a pregnant woman and her unborn baby. This test involves  the use of radiation. Radiation  exposure can be dangerous to a pregnant woman and her unborn baby. If you are pregnant, you generally should not have this procedure done. Slight increase in the risk of cancer. This is because of the radiation involved in the test. What happens before the procedure? No preparation is needed for this procedure. What happens during the procedure? You will undress and remove any jewelry around your neck or chest. You will put on a hospital gown. Sticky electrodes will be placed on your chest. The electrodes will be connected to an electrocardiogram (ECG) machine to record a tracing of the electrical activity of your heart. A CT scanner will take pictures of your heart. During this time, you will be asked to lie still and hold your breath for 2-3 seconds while a picture of your heart is being taken. The procedure may vary among health care providers and hospitals. What happens after the procedure? You can get dressed. You can return to your normal activities. It is up to you to get the results of your test. Ask your health care provider, or the department that is doing the test, when your results will be ready. Summary A coronary calcium scan is an imaging test used to look for deposits of calcium and other fatty materials (plaques) in the inner lining of the blood vessels of the heart (coronary arteries). Generally, this is a safe procedure. Tell your health care provider if you are pregnant or may be pregnant. No preparation is needed for this procedure. A CT scanner will take pictures of your heart. You can return to your normal activities after the scan is done. This information is not intended to replace advice given to you by your health care provider. Make sure you discuss any questions you have with your health care provider. Document Released: 12/29/2007 Document Revised: 05/21/2016 Document Reviewed: 05/21/2016 Elsevier Interactive Patient Education  2017  [place] Term Monitor Instructions  Your physician has requested you wear a ZIO patch monitor for 14 days.  This is a single patch monitor. [employer] supplies one patch monitor per enrollment. Additional stickers are not available. Please do not apply patch if you will be having a Nuclear Stress Test,  Echocardiogram, Cardiac CT, MRI, or Chest Xray during the period you would be wearing the  monitor. The patch cannot be worn during these tests. You cannot remove and re-apply the  ZIO XT patch monitor.  Your ZIO patch monitor will be mailed 3 day USPS to your address on file. It may take 3-5 days  to receive your monitor after you have been enrolled.  Once you have received your monitor, please review the enclosed instructions. Your monitor  has already been registered assigning a specific monitor serial # to you.  Billing and Patient Assistance Program Information  We have supplied [employer] with any of your insurance information on file for billing purposes. [employer] offers a sliding scale Patient Assistance Program for patients that do not have  insurance, or whose insurance does not completely cover the cost of the ZIO monitor.  You must apply for the Patient Assistance Program to qualify for this discounted rate.  To apply, please call [employer] at [phone], select option 4, select option 2, ask to apply for  Patient Assistance Program. Theodore Demark will ask your household income, and how many people  are in your household. They will quote your out-of-pocket cost based on that information.  [employer] will also be able to set up  a 57-month interest-free payment plan if needed.  Applying the monitor   Shave hair from upper left chest.  Hold abrader disc by orange tab. Rub abrader in 40 strokes over the upper left chest as  indicated in your monitor instructions.  Clean area with 4 enclosed alcohol pads. Let dry.  Apply patch as indicated in monitor instructions. Patch  will be placed under collarbone on left  side of chest with arrow pointing upward.  Rub patch adhesive wings for 2 minutes. Remove white label marked "1". Remove the white  label marked "2". Rub patch adhesive wings for 2 additional minutes.  While looking in a mirror, press and release button in center of patch. A small green light will  flash 3-4 times. This will be your only indicator that the monitor has been turned on.  Do not shower for the first 24 hours. You may shower after the first 24 hours.  Press the button if you feel a symptom. You will hear a small click. Record Date, Time and  Symptom in the Patient Logbook.  When you are ready to remove the patch, follow instructions on the last 2 pages of Patient  Logbook. Stick patch monitor onto the last page of Patient Logbook.  Place Patient Logbook in the blue and white box. Use locking tab on box and tape box closed  securely. The blue and white box has prepaid postage on it. Please place it in the mailbox as  soon as possible. Your physician should have your test results approximately 7 days after the  monitor has been mailed back to [facility]  Call IAlexanderat [phone] you have questions regarding  your ZIO XT patch monitor. Call them immediately if you see an orange light blinking on your  monitor.  If your monitor falls off in less than 4 days, contact our Monitor department at [phone]  If your monitor becomes loose or falls off after 4 days call [employer] at [phone]  suggestions on securing your monitor   Follow-Up: [facility] you and your health needs are our priority.  As part of our continuing mission to provide you with exceptional heart care, we have created designated Provider Care Teams.  These Care Teams include your primary Cardiologist (physician) and Advanced Practice Providers (APPs -  Physician Assistants and Nurse Practitioners) who all work together to  provide you with the care you need, when you need it.   Your next appointment:   6-8 week(s)  The format for your next appointment:   In Person  Provider:   TShelva Majestic MD     Other Instructions   Important Information About Sugar      Your physician recommends that you continue on your current medications as directed. Please refer to the Current Medication list given to you today.    Signed, TShelva Majestic MD  06/30/2022 12:09 PM    [place] [place]  [id number]: [phone]

## 2022-06-19 LAB — COMPREHENSIVE METABOLIC PANEL
ALT: 14 IU/L (ref 0–44)
AST: 17 IU/L (ref 0–40)
Albumin/Globulin Ratio: 1.6 (ref 1.2–2.2)
Albumin: 4.2 g/dL (ref 3.8–4.8)
Alkaline Phosphatase: 62 IU/L (ref 44–121)
BUN/Creatinine Ratio: 19 (ref 10–24)
BUN: 13 mg/dL (ref 8–27)
Bilirubin Total: 0.3 mg/dL (ref 0.0–1.2)
CO2: 24 mmol/L (ref 20–29)
Calcium: 9.3 mg/dL (ref 8.6–10.2)
Chloride: 102 mmol/L (ref 96–106)
Creatinine, Ser: 0.68 mg/dL — ABNORMAL LOW (ref 0.76–1.27)
Globulin, Total: 2.6 g/dL (ref 1.5–4.5)
Glucose: 95 mg/dL (ref 70–99)
Potassium: 4.8 mmol/L (ref 3.5–5.2)
Sodium: 140 mmol/L (ref 134–144)
Total Protein: 6.8 g/dL (ref 6.0–8.5)
eGFR: 98 mL/min/{1.73_m2} (ref >59)

## 2022-06-19 LAB — LIPID PANEL
Chol/HDL Ratio: 2.2 ratio (ref 0.0–5.0)
Cholesterol, Total: 143 mg/dL (ref 100–199)
HDL: 66 mg/dL (ref >39)
LDL Chol Calc (NIH): 63 mg/dL (ref 0–99)
Triglycerides: 68 mg/dL (ref 0–149)
VLDL Cholesterol Cal: 14 mg/dL (ref 5–40)

## 2022-06-19 LAB — TSH: TSH: 1.46 u[IU]/mL (ref 0.450–4.500)

## 2022-06-19 LAB — MAGNESIUM: Magnesium: 2.2 mg/dL (ref 1.6–2.3)

## 2022-06-20 ENCOUNTER — Ambulatory Visit (HOSPITAL_COMMUNITY)
Admission: RE | Admit: 2022-06-20 | Payer: Medicare PPO | Source: Ambulatory Visit | Attending: Cardiovascular Disease | Admitting: Cardiovascular Disease

## 2022-06-22 ENCOUNTER — Ambulatory Visit (HOSPITAL_COMMUNITY): Payer: Medicare PPO | Attending: Cardiovascular Disease

## 2022-06-22 DIAGNOSIS — I351 Nonrheumatic aortic (valve) insufficiency: Secondary | ICD-10-CM | POA: Diagnosis not present

## 2022-06-22 DIAGNOSIS — R0602 Shortness of breath: Secondary | ICD-10-CM | POA: Diagnosis not present

## 2022-06-22 DIAGNOSIS — Z87891 Personal history of nicotine dependence: Secondary | ICD-10-CM | POA: Insufficient documentation

## 2022-06-22 DIAGNOSIS — E785 Hyperlipidemia, unspecified: Secondary | ICD-10-CM | POA: Insufficient documentation

## 2022-06-22 DIAGNOSIS — R002 Palpitations: Secondary | ICD-10-CM | POA: Insufficient documentation

## 2022-06-22 DIAGNOSIS — Z8249 Family history of ischemic heart disease and other diseases of the circulatory system: Secondary | ICD-10-CM | POA: Insufficient documentation

## 2022-06-22 LAB — ECHOCARDIOGRAM COMPLETE
Area-P 1/2: 2.88 cm2
P 1/2 time: 644 msec
S' Lateral: 2.9 cm

## 2022-06-24 DIAGNOSIS — R002 Palpitations: Secondary | ICD-10-CM | POA: Diagnosis not present

## 2022-06-25 ENCOUNTER — Ambulatory Visit (HOSPITAL_COMMUNITY)
Admission: RE | Admit: 2022-06-25 | Discharge: 2022-06-25 | Disposition: A | Discharge status: Home / Self Care | Payer: Medicare PPO | Source: Ambulatory Visit | Attending: Cardiology | Admitting: Cardiology

## 2022-06-25 DIAGNOSIS — M542 Cervicalgia: Secondary | ICD-10-CM | POA: Diagnosis not present

## 2022-06-25 DIAGNOSIS — Z8249 Family history of ischemic heart disease and other diseases of the circulatory system: Secondary | ICD-10-CM | POA: Diagnosis not present

## 2022-06-25 DIAGNOSIS — I10 Essential (primary) hypertension: Secondary | ICD-10-CM | POA: Diagnosis not present

## 2022-06-25 DIAGNOSIS — M546 Pain in thoracic spine: Secondary | ICD-10-CM | POA: Diagnosis not present

## 2022-06-25 DIAGNOSIS — Z87891 Personal history of nicotine dependence: Secondary | ICD-10-CM | POA: Diagnosis not present

## 2022-06-25 DIAGNOSIS — M9901 Segmental and somatic dysfunction of cervical region: Secondary | ICD-10-CM | POA: Diagnosis not present

## 2022-06-25 DIAGNOSIS — M5442 Lumbago with sciatica, left side: Secondary | ICD-10-CM | POA: Diagnosis not present

## 2022-06-25 DIAGNOSIS — M9902 Segmental and somatic dysfunction of thoracic region: Secondary | ICD-10-CM | POA: Diagnosis not present

## 2022-06-25 DIAGNOSIS — M9903 Segmental and somatic dysfunction of lumbar region: Secondary | ICD-10-CM | POA: Diagnosis not present

## 2022-06-25 DIAGNOSIS — M9905 Segmental and somatic dysfunction of pelvic region: Secondary | ICD-10-CM | POA: Diagnosis not present

## 2022-06-26 ENCOUNTER — Ambulatory Visit: Payer: Medicare PPO | Admitting: Cardiovascular Disease

## 2022-06-27 ENCOUNTER — Ambulatory Visit (INDEPENDENT_AMBULATORY_CARE_PROVIDER_SITE_OTHER): Payer: Medicare PPO | Admitting: Internal Medicine

## 2022-06-27 ENCOUNTER — Encounter: Payer: Self-pay | Admitting: Internal Medicine

## 2022-06-27 VITALS — BP 122/75 | HR 89 | Ht 72.0 in | Wt 187.0 lb

## 2022-06-27 DIAGNOSIS — N401 Enlarged prostate with lower urinary tract symptoms: Secondary | ICD-10-CM

## 2022-06-27 DIAGNOSIS — C3432 Malignant neoplasm of lower lobe, left bronchus or lung: Secondary | ICD-10-CM | POA: Diagnosis not present

## 2022-06-27 DIAGNOSIS — M5416 Radiculopathy, lumbar region: Secondary | ICD-10-CM | POA: Diagnosis not present

## 2022-06-27 DIAGNOSIS — N138 Other obstructive and reflux uropathy: Secondary | ICD-10-CM | POA: Diagnosis not present

## 2022-06-27 DIAGNOSIS — Z0001 Encounter for general adult medical examination with abnormal findings: Secondary | ICD-10-CM | POA: Diagnosis not present

## 2022-06-27 HISTORY — DX: Encounter for general adult medical examination with abnormal findings: Z00.01

## 2022-06-27 MED ORDER — CYCLOBENZAPRINE HCL 10 MG PO TABS: 10.0000 mg | ORAL_TABLET | Freq: Every day | ORAL | 5 refills | Status: DC

## 2022-06-27 MED ORDER — GABAPENTIN 300 MG PO CAPS: 300.0000 mg | ORAL_CAPSULE | Freq: Two times a day (BID) | ORAL | 1 refills | Status: DC

## 2022-06-27 NOTE — Assessment & Plan Note (Signed)
Has had lumbar and cervical spine surgery Was followed by Dr. Tamala Julian at [facility] spine and pain management clinic Has had PT Had started gabapentin 300 mg nightly, increased dose of gabapentin to 300 mg twice daily Also has piriformis syndrome -Flexeril as needed

## 2022-06-27 NOTE — Assessment & Plan Note (Signed)
Former smoker Followed by Pulmonology and Oncology S/p lobectomy at [facility]

## 2022-06-27 NOTE — Patient Instructions (Signed)
Please continue taking medications as prescribed.  Please continue to follow low salt diet and perform moderate exercise/walking as tolerated.

## 2022-06-27 NOTE — Progress Notes (Signed)
Established Patient Office Visit  Subjective:  Patient ID: Russell [name], male    DOB: 03-21-1946  Age: 76 y.o. MRN: [id number]  CC:  Chief Complaint  Patient presents with   Annual Exam    Patient states he is having loss of nerve in left leg, he brought in documentation to show the test he had done. Also discuss heart from cardiology visit.    HPI Russell [name] is a 76 y.o. male with past medical history of DDD of cervical and lumbar spine, cauda equina syndrome, GERD, lung cancer and BPH who presents for annual physical.  He c/o lower back pain, which is chronic, intermittent and has numbness and weakness of the left LE.  He was started on gabapentin.  He states that he has better pain control overall, but has worsening of pain and muscle spasms when he has to bend.  He has severe left thigh and leg pain after bending, which lasted for rest of the day.  He prefers to avoid opioid medicine.  He saw Dr. Tamala Julian at [facility] spine and pain management clinic in the past.  He was seen by Dr. Christella Noa, and had NCS done, which showed L4, L5 and S1 nerve root compression.  He is referred to PM&R. He has worsening of his symptoms with bending, especially while tying his shoelaces.  His symptoms are worse in the evening.  He has been treated with steroids, gabapentin and muscle relaxers in the past.   He sees Dr. Erin Fulling for history of left lower lobe lung CA and has had resection of left lower lobe with Dr. Erie Noe at [facility].  He denies any chest pain, dyspnea or wheezing currently.  He recently had echo and [place] of abdomen for AAA screening, which were reviewed and discussed with the patient.  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    anterior cer disc and fusion with plates C3-4 and E7-0 at [facility]   Constipation    Encounter for general adult medical examination with abnormal findings 06/27/2022   Enlarged prostate    GERD (gastroesophageal reflux disease)    Hemorrhoids    History of kidney stones    passed stones   History of lumbar laminectomy 05/12/2020   open L2-L5 at [facility]   Hx of colonic polyp 05/29/2018   05/2018 cecal ssp   Lung nodule 02/2021   Maxillary sinus polyp    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   Prolapsed internal hemorrhoids, grade 3 09/10/2006   Qualifier: Diagnosis of  By: Jonna Munro MD, Cornelius     Salivary gland enlargement    Sleep apnea 10 years ago   declined CPAP   Stroke ([place]) 15-20 years ago    Past Surgical History:  Procedure Laterality Date   BRONCHIAL BIOPSY  05/16/2021   Procedure: BRONCHIAL BIOPSIES;  Surgeon: Garner Nash, DO;  Location: [facility];  Service: Pulmonary;;   BRONCHIAL BRUSHINGS  05/16/2021   Procedure: BRONCHIAL BRUSHINGS;  Surgeon: Garner Nash, DO;  Location: [place];  Service: Pulmonary;;   BRONCHIAL NEEDLE ASPIRATION BIOPSY  05/16/2021   Procedure: BRONCHIAL NEEDLE ASPIRATION BIOPSIES;  Surgeon: Garner Nash, DO;  Location: [place] [facility];  Service: Pulmonary;;   CERVICAL SPINE SURGERY  04/10/2020   C3-C4 with pin at [facility]   COLONOSCOPY     FIDUCIAL MARKER PLACEMENT  05/16/2021   Procedure: FIDUCIAL MARKER PLACEMENT;  Surgeon: Garner Nash, DO;  Location: [place] [facility];  Service: Pulmonary;;   HEMORRHOID BANDING     LUMBAR LAMINECTOMY  03/08/2010   L2-3, cauda equina syndrome   lumbar laminectomy  05/12/2020   [facility]   SPINE SURGERY     UPPER GI ENDOSCOPY     VIDEO BRONCHOSCOPY WITH ENDOBRONCHIAL NAVIGATION Left 05/16/2021   Procedure: VIDEO BRONCHOSCOPY WITH ENDOBRONCHIAL NAVIGATION;  Surgeon: Garner Nash, DO;  Location: [place];  Service: Pulmonary;  Laterality: Left;  ION w/ fiducial placement   VIDEO BRONCHOSCOPY WITH RADIAL  ENDOBRONCHIAL ULTRASOUND  05/16/2021   Procedure: VIDEO BRONCHOSCOPY WITH RADIAL ENDOBRONCHIAL ULTRASOUND;  Surgeon: Garner Nash, DO;  Location: [name];  Service: Pulmonary;;   wisdom teeth ext      Family History  Problem Relation Age of Onset   Aortic aneurysm Mother    Varicose Veins Mother    Heart attack Father    Arthritis Father    Aortic aneurysm Sister    Early death Sister    CVA Sister    Aortic aneurysm Sister    Aortic aneurysm Sister    Colon cancer Neg Hx    Pancreatic cancer Neg Hx    Rectal cancer Neg Hx    Stomach cancer Neg Hx    Esophageal cancer Neg Hx    Colon polyps Neg Hx     Social History   Socioeconomic History   Marital status: Married    Spouse name: Not on file   Number of children: 0   Years of education: Not on file   Highest education level: Not on file  Occupational History   Occupation: retired  Tobacco Use   Smoking status: Former    Packs/day: 0.25    Years: 9.00    Total pack years: 2.25    Types: Cigarettes    Start date: 10/15/1965    Quit date: 10/16/1974    Years since quitting: 47.7    Passive exposure: Past   Smokeless tobacco: Never  Vaping Use   Vaping Use: Never used  Substance and Sexual Activity   Alcohol use: Not Currently    Comment: rarely wine   Drug use: No   Sexual activity: Not Currently  Other Topics Concern   Not on file  Social History Narrative   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   Social Determinants of Health   Financial Resource Strain: Low Risk  (01/31/2022)   Overall Financial Resource Strain (CARDIA)    Difficulty of Paying Living Expenses: Not hard at all  Food Insecurity: No Food Insecurity (01/31/2022)   Hunger Vital Sign    Worried About Running Out of Food in the Last Year: Never true    Ran  Out of Food in the Last Year: Never true  Transportation Needs: No Transportation Needs (01/31/2022)   PRAPARE - Hydrologist (Medical): No    Lack of Transportation (Non-Medical): No  Physical Activity: Sufficiently Active (01/31/2022)   Exercise Vital Sign  Days of Exercise per Week: 7 days    Minutes of Exercise per Session: 60 min  Stress: No Stress Concern Present (01/31/2022)   [place]    Feeling of Stress : Not at all  Social Connections: Moderately Isolated (01/31/2022)   Social Connection and Isolation Panel [NHANES]    Frequency of Communication with Friends and Family: More than three times a week    Frequency of Social Gatherings with Friends and Family: More than three times a week    Attends Religious Services: Never    Marine scientist or Organizations: No    Attends Archivist Meetings: Never    Marital Status: Married  Human resources officer Violence: Not At Risk (01/31/2022)   Humiliation, Afraid, Rape, and Kick questionnaire    Fear of Current or Ex-Partner: No    Emotionally Abused: No    Physically Abused: No    Sexually Abused: No    Outpatient Medications Prior to Visit  Medication Sig Dispense Refill   diazepam (VALIUM) 5 MG tablet Take by mouth.     HYDROcodone-acetaminophen (NORCO/VICODIN) 5-325 MG tablet Take 1 tablet by mouth 2 (two) times daily as needed.     Acetaminophen 500 MG capsule      aspirin 81 MG EC tablet Take 81 mg by mouth every evening.     Docusate Sodium (DSS) 100 MG CAPS      finasteride (PROSCAR) 5 MG tablet Take 1 tablet (5 mg total) by mouth daily. 90 tablet 3   hydrocortisone (ANUSOL-HC) 2.5 % rectal cream Place 1 application. rectally 2 (two) times daily. (Patient taking differently: Place 1 application  rectally 3 times/day as needed-between meals & bedtime.) 30 g 1   Magnesium 400 MG CAPS Take 400 mg by mouth 2 (two) times  daily. asporotate     Melatonin 5 MG/ML LIQD Take 5 mg by mouth daily.     metroNIDAZOLE (METROGEL) 0.75 % gel Apply to face daily for rosacea 45 g 4   Omega-3 Fatty Acids (FISH OIL) 1200 MG CAPS Take 1,200 mg by mouth daily.     omeprazole (PRILOSEC) 40 MG capsule TAKE (1) CAPSULE BY MOUTH ONCE DAILY. 90 capsule 3   polyethylene glycol powder (GLYCOLAX/MIRALAX) 17 GM/SCOOP powder Take 0.5 Containers by mouth daily as needed for moderate constipation or mild constipation.     Probiotic Product (PROBIOTIC DAILY PO) Take 1 tablet by mouth 2 (two) times daily.     senna-docusate (SENOKOT-S) 8.6-50 MG tablet Take 2 tablets by mouth at bedtime as needed for mild constipation.     silodosin (RAPAFLO) 8 MG CAPS capsule Take 1 capsule (8 mg total) by mouth 2 (two) times daily. 30 capsule 11   TURMERIC PO Take 750 mg by mouth daily. curamed curcumin     gabapentin (NEURONTIN) 300 MG capsule Take 1 capsule (300 mg total) by mouth 2 (two) times daily. 180 capsule 1   No facility-administered medications prior to visit.    No Known Allergies  ROS Review of Systems  Constitutional:  Negative for chills and fever.  HENT:  Negative for congestion and sore throat.   Eyes:  Negative for pain and discharge.  Respiratory:  Negative for cough and shortness of breath.   Cardiovascular:  Negative for chest pain and palpitations.  Gastrointestinal:  Negative for diarrhea, nausea and vomiting.  Endocrine: Negative for polydipsia and polyuria.  Genitourinary:  Negative for dysuria and hematuria.  Musculoskeletal:  Positive for back pain and neck pain. Negative for neck stiffness.  Skin:  Negative for rash.  Neurological:  Positive for weakness and numbness. Negative for headaches.  Psychiatric/Behavioral:  Negative for agitation and behavioral problems.       Objective:    Physical Exam Vitals reviewed.  Constitutional:      General: He is not in acute distress.    Appearance: He is not diaphoretic.      Comments: Has a cane  HENT:     Head: Normocephalic and atraumatic.     Nose: Nose normal.     Mouth/Throat:     Mouth: Mucous membranes are moist.  Eyes:     General: No scleral icterus.    Extraocular Movements: Extraocular movements intact.  Cardiovascular:     Rate and Rhythm: Normal rate and regular rhythm.     Pulses: Normal pulses.     Heart sounds: Normal heart sounds. No murmur heard. Pulmonary:     Breath sounds: Normal breath sounds. No wheezing or rales.  Abdominal:     Palpations: Abdomen is soft.     Tenderness: There is no abdominal tenderness.  Musculoskeletal:        General: Tenderness present.     Cervical back: Neck supple. No tenderness.     Right lower leg: No edema.     Left lower leg: No edema.  Skin:    General: Skin is warm.     Findings: No rash.  Neurological:     General: No focal deficit present.     Mental Status: He is alert and oriented to person, place, and time.     Sensory: Sensory deficit (LLE) present.     Motor: Weakness (4/5 in LLE) present.  Psychiatric:        Mood and Affect: Mood normal.        Behavior: Behavior normal.     BP 122/75 (BP Location: Left Arm, Patient Position: Sitting, Cuff Size: Large)   Pulse 89   Ht 6' (1.829 m)   Wt 187 lb (84.8 kg)   SpO2 99%   BMI 25.36 kg/m  Wt Readings from Last 3 Encounters:  06/27/22 187 lb (84.8 kg)  06/18/22 185 lb (83.9 kg)  01/31/22 185 lb 9.6 oz (84.2 kg)    Lab Results  Component Value Date   TSH 1.460 06/18/2022   Lab Results  Component Value Date   WBC 5.0 05/16/2021   HGB 12.7 (L) 05/16/2021   HCT 40.5 05/16/2021   MCV 81.3 05/16/2021   PLT 201 05/16/2021   Lab Results  Component Value Date   NA 140 06/18/2022   K 4.8 06/18/2022   CO2 24 06/18/2022   GLUCOSE 95 06/18/2022   BUN 13 06/18/2022   CREATININE 0.68 (L) 06/18/2022   BILITOT 0.3 06/18/2022   ALKPHOS 62 06/18/2022   AST 17 06/18/2022   ALT 14 06/18/2022   PROT 6.8 06/18/2022   ALBUMIN  4.2 06/18/2022   CALCIUM 9.3 06/18/2022   ANIONGAP 7 05/16/2021   EGFR 98 06/18/2022   GFR 112.81 09/28/2019   Lab Results  Component Value Date   CHOL 143 06/18/2022   Lab Results  Component Value Date   HDL 66 06/18/2022   Lab Results  Component Value Date   LDLCALC 63 06/18/2022   Lab Results  Component Value Date   TRIG 68 06/18/2022   Lab Results  Component Value Date   CHOLHDL 2.2 06/18/2022   Lab Results  Component Value Date   HGBA1C 5.6 09/29/2019      Assessment & Plan:   Problem List Items Addressed This Visit       Respiratory   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 Tamala Julian at [facility] spine and pain management clinic Has had PT Had started gabapentin 300 mg nightly, increased dose of gabapentin to 300 mg twice daily Also has piriformis syndrome -Flexeril as needed      Relevant Medications   diazepam (VALIUM) 5 MG tablet   cyclobenzaprine (FLEXERIL) 10 MG tablet   gabapentin (NEURONTIN) 300 MG capsule     Genitourinary   Benign prostatic hyperplasia with urinary obstruction    Takes Rapaflo and finasteride Followed by Dr. Alyson Ingles        Other   Encounter for general adult medical examination with abnormal findings - Primary    Physical exam as documented. Fasting blood tests from chart reviewed. Has had flu and pneumococcal vaccines at local pharmacy.       Meds ordered this encounter  Medications   cyclobenzaprine (FLEXERIL) 10 MG tablet    Sig: Take 1 tablet (10 mg total) by mouth at bedtime.    Dispense:  30 tablet    Refill:  5   gabapentin (NEURONTIN) 300 MG capsule    Sig: Take 1 capsule (300 mg total) by mouth 2 (two) times daily.    Dispense:  180  capsule    Refill:  1    Follow-up: Return in about 6 months (around 12/27/2022).    [name], MD

## 2022-06-27 NOTE — Assessment & Plan Note (Signed)
Takes Rapaflo and finasteride Followed by Dr. Alyson Ingles

## 2022-06-27 NOTE — Assessment & Plan Note (Signed)
Physical exam as documented. Fasting blood tests from chart reviewed. Has had flu and pneumococcal vaccines at local pharmacy.

## 2022-06-28 ENCOUNTER — Encounter: Payer: Self-pay | Admitting: Neurosurgery

## 2022-06-28 ENCOUNTER — Encounter: Payer: Self-pay | Admitting: Physical Medicine and Rehabilitation

## 2022-06-30 ENCOUNTER — Encounter: Payer: Self-pay | Admitting: Cardiovascular Disease

## 2022-07-06 ENCOUNTER — Other Ambulatory Visit (HOSPITAL_COMMUNITY): Payer: Medicare PPO

## 2022-07-11 ENCOUNTER — Ambulatory Visit (HOSPITAL_BASED_OUTPATIENT_CLINIC_OR_DEPARTMENT_OTHER)
Admission: RE | Admit: 2022-07-11 | Discharge: 2022-07-11 | Disposition: A | Discharge status: Home / Self Care | Payer: Medicare PPO | Source: Ambulatory Visit | Attending: Cardiovascular Disease | Admitting: Cardiovascular Disease

## 2022-07-11 DIAGNOSIS — Z8249 Family history of ischemic heart disease and other diseases of the circulatory system: Secondary | ICD-10-CM | POA: Insufficient documentation

## 2022-07-11 DIAGNOSIS — R002 Palpitations: Secondary | ICD-10-CM | POA: Insufficient documentation

## 2022-07-13 DIAGNOSIS — R002 Palpitations: Secondary | ICD-10-CM | POA: Diagnosis not present

## 2022-07-17 ENCOUNTER — Ambulatory Visit: Payer: Medicare PPO | Admitting: Dermatology

## 2022-07-19 ENCOUNTER — Encounter: Payer: Self-pay | Admitting: Family Medicine

## 2022-07-19 ENCOUNTER — Ambulatory Visit: Payer: Medicare PPO | Admitting: Family Medicine

## 2022-07-19 VITALS — BP 127/87 | Ht 72.0 in | Wt 188.0 lb

## 2022-07-19 DIAGNOSIS — C3432 Malignant neoplasm of lower lobe, left bronchus or lung: Secondary | ICD-10-CM | POA: Diagnosis not present

## 2022-07-19 DIAGNOSIS — R059 Cough, unspecified: Secondary | ICD-10-CM

## 2022-07-19 DIAGNOSIS — U071 COVID-19: Secondary | ICD-10-CM | POA: Diagnosis not present

## 2022-07-19 MED ORDER — AZITHROMYCIN 250 MG PO TABS: ORAL_TABLET | ORAL | 0 refills | Status: AC

## 2022-07-19 MED ORDER — BENZONATATE 100 MG PO CAPS: 100.0000 mg | ORAL_CAPSULE | Freq: Two times a day (BID) | ORAL | 0 refills | Status: DC | PRN

## 2022-07-19 NOTE — Progress Notes (Signed)
   Russell [name]     MRN: [id number]      DOB: [birth year]   HPI Russell [name] is here fwith 2 day h/o acute onset of cough wth tickle in throat and lethargy. Denies fever or chills. Wife hs similar sym[tpms for approx 1 week and is worsenign so he feeels he is contracting her illness He has had removal of left lower lobe of lung, due to cancer found incidentally ROS Denies recent fever or chills. Denies sinus pressure, nasal congestion, ear pain or sore throat. Denies chest congestion, productive cough or wheezing. Denies chest pains, palpitations and leg swelling Denies abdominal pain, nausea, vomiting,diarrhea or constipation.   Denies dysuria, frequency, hesitancy or incontinence. Denies joint pain, swelling and limitation in mobility. Denies headaches, seizures, numbness, or tingling. Denies depression, anxiety or insomnia. Denies skin break down or rash.   PE  BP 127/87 (BP Location: Right Arm, Patient Position: Sitting, Cuff Size: Large)   Ht 6' (1.829 m)   Wt 188 lb 0.6 oz (85.3 kg)   BMI 25.50 kg/m   Patient alert and oriented and in no cardiopulmonary distress.  HEENT: No facial asymmetry, EOMI,     Neck supple .  Chest: adequate air entry, reduced in lower left lung, no crackles or wheezes heard.  CVS: S1, S2 no murmurs, no S3.Regular rate.  ABD: Soft non tender.   Ext: No edema  MS: Adequate ROM spine, shoulders, hips and knees.  Skin: Intact, no ulcerations or rash noted.  Psych: Good eye contact, normal affect. Memory intact not anxious or depressed appearing.  CNS: CN 2-12 intact, power,  normal throughout.no focal deficits noted.   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 Will wait on test results from triple viral testing, if symptoms worsen in the interim or after then he needs to take the azithromycin, will follow results closely

## 2022-07-19 NOTE — Progress Notes (Incomplete)
   Russell [name]     MRN: [id number]      DOB: 1946-04-02   HPI Russell [name] is here fwith 2 day h/o acute onset of cough wth tickle in throat and lethargy. Denies fever or chills. Wife hs similar sym[tpms for approx 1 week and is worsenign so he feeels he is contracting her illness He has had removal of left lower lobe of lung, due to cancer found incidentally ROS Denies recent fever or chills. Denies sinus pressure, nasal congestion, ear pain or sore throat. Denies chest congestion, productive cough or wheezing. Denies chest pains, palpitations and leg swelling Denies abdominal pain, nausea, vomiting,diarrhea or constipation.   Denies dysuria, frequency, hesitancy or incontinence. Denies joint pain, swelling and limitation in mobility. Denies headaches, seizures, numbness, or tingling. Denies depression, anxiety or insomnia. Denies skin break down or rash.   PE  BP 127/87 (BP Location: Right Arm, Patient Position: Sitting, Cuff Size: Large)   Ht 6' (1.829 m)   Wt 188 lb 0.6 oz (85.3 kg)   BMI 25.50 kg/m   Patient alert and oriented and in no cardiopulmonary distress.  HEENT: No facial asymmetry, EOMI,     Neck supple .  Chest: adequate air entry, reduced in lower left lung, no crackles or wheezes heard.  CVS: S1, S2 no murmurs, no S3.Regular rate.  ABD: Soft non tender.   Ext: No edema  MS: Adequate ROM spine, shoulders, hips and knees.  Skin: Intact, no ulcerations or rash noted.  Psych: Good eye contact, normal affect. Memory intact not anxious or depressed appearing.  CNS: CN 2-12 intact, power,  normal throughout.no focal deficits noted.   Assessment & Plan  ***

## 2022-07-19 NOTE — Patient Instructions (Addendum)
Follow-up with PCP as before, call if you need to be seen sooner.  You are being tested for COVID RSV and influenza.  Treatment will be prescribed as soon as results are available if needed.  I recommend symptomatic medicine only as a decongestant pearls at this time. This has been prescribed   On review of your record because of the lung cancer history with part of the lung removed I have  prescribed  an antibiotic course for you.   I recommend not actually starting the antibiotic unless you develop fever or chills or start having productive cough.   I think your symptoms are more in keeping with a viral illness and will be in touch with result info as soon as it is available  Fluids , rest , and adequate nutrition please  Thanks for choosing [facility], we consider it a privelige to serve you.

## 2022-07-20 DIAGNOSIS — R059 Cough, unspecified: Secondary | ICD-10-CM | POA: Insufficient documentation

## 2022-07-20 NOTE — Assessment & Plan Note (Signed)
Acute onset, 2 day history, negative covid home test, wife sick x 1 week Test for flu, RSV and covid Increased risk of complication, has LLL lobectomy Tessalon pelres  and if symptoms worsen, azithromycin to be taken, advised to hold currently based on symptoms reported and exam at visit

## 2022-07-20 NOTE — Assessment & Plan Note (Signed)
Stable from oncology standpoint, no evidence of recurrent disease following surgery, however smoking history ad lung cancer historty inc risk of comoplicion from respiratory illness, low htreshold for abx. Will wait on test results from triple viral testing, if symptoms worsen in the interim or after then he needs to take the azithromycin, will follow results closely

## 2022-07-21 LAB — COVID-19, FLU A+B AND RSV
Influenza A, NAA: NOT DETECTED
Influenza B, NAA: NOT DETECTED
RSV, NAA: NOT DETECTED
SARS-CoV-2, NAA: NOT DETECTED

## 2022-08-07 ENCOUNTER — Ambulatory Visit: Payer: Medicare PPO | Attending: Cardiovascular Disease | Admitting: Cardiovascular Disease

## 2022-08-07 VITALS — BP 122/74 | HR 61 | Ht 72.0 in | Wt 188.4 lb

## 2022-08-07 DIAGNOSIS — I358 Other nonrheumatic aortic valve disorders: Secondary | ICD-10-CM

## 2022-08-07 DIAGNOSIS — Z85118 Personal history of other malignant neoplasm of bronchus and lung: Secondary | ICD-10-CM | POA: Diagnosis not present

## 2022-08-07 DIAGNOSIS — I471 Supraventricular tachycardia, unspecified: Secondary | ICD-10-CM | POA: Diagnosis not present

## 2022-08-07 DIAGNOSIS — J84112 Idiopathic pulmonary fibrosis: Secondary | ICD-10-CM | POA: Diagnosis not present

## 2022-08-07 DIAGNOSIS — Z8249 Family history of ischemic heart disease and other diseases of the circulatory system: Secondary | ICD-10-CM

## 2022-08-07 DIAGNOSIS — R002 Palpitations: Secondary | ICD-10-CM

## 2022-08-07 DIAGNOSIS — R931 Abnormal findings on diagnostic imaging of heart and coronary circulation: Secondary | ICD-10-CM

## 2022-08-07 MED ORDER — METOPROLOL SUCCINATE ER 25 MG PO TB24: 25.0000 mg | ORAL_TABLET | Freq: Every day | ORAL | 3 refills | Status: DC

## 2022-08-07 NOTE — Patient Instructions (Signed)
Medication Instructions:   START METOPROLOL SUCC ER 25 MG ONCE DAILY  *If you need a refill on your cardiac medications before your next appointment, please call your pharmacy*   Follow-Up: At [facility], you and your health needs are our priority.  As part of our continuing mission to provide you with exceptional heart care, we have created designated Provider Care Teams.  These Care Teams include your primary Cardiologist (physician) and Advanced Practice Providers (APPs -  Physician Assistants and Nurse Practitioners) who all work together to provide you with the care you need, when you need it.  We recommend signing up for the patient portal called "MyChart".  Sign up information is provided on this After Visit Summary.  MyChart is used to connect with patients for Virtual Visits (Telemedicine).  Patients are able to view lab/test results, encounter notes, upcoming appointments, etc.  Non-urgent messages can be sent to your provider as well.   To learn more about what you can do with MyChart, go to [email or web address].    Your next appointment:   6 month(s)  Provider:   [name], MD

## 2022-08-07 NOTE — Progress Notes (Signed)
Cardiology Office Note    Date:  08/12/2022   ID:  Russell [name], DOB 1945-12-13, MRN [id number]  PCP:  Russell Halon, MD  Cardiologist:  Russell Guadalajara, MD   6 week F/U  cardiology evaluation initiallyreferred by Russell [name] for palpitations and shortness of breath.  History of Present Illness:  Russell [name] is a 77 y.o. male who had remotely seen Russell [name] in 2018 for chest pain.  He has a history of chronic GERD, arthritis with back pain issues.  It was felt his chest pain was noncardiac in etiology.  Follow-up GI evaluation was recommended.  Recently, he has been followed by Russell [name].  He was seen by him in July 2003 with chronic intermittent low back pain and weakness of his left lower extremity.  He has been evaluated at the [facility] spine and pain management clinic.  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  He was last evaluated by Russell [name] on April 27, 2022.  CT of his chest revealed minimal soft tissue thickening at the resection margin of his left lower lobe sublobar resection.  There was no evidence for recurrent or metastatic disease.  There was subpleural reticulation concerning for mild fibrosis/interstitial lung disease.  He was referred to me by Russell [name] and I saw him for my initial evaluation on June 18, 2022.   Russell [name] has noticed 3 weeks of sudden episodes of palpitations.  He admits to a dry cough.  He had a sister who was undergone CABG revascularization in the mother who had aortic aneurysm.  Russell [name] smoked from [phone] but quit smoking in 1976.  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  He denies any chest tightness or pressure.   He admits to transient episodes of skipped beats that may last 2 minutes.  At times he walks with a walker for balance.  When he walks his dog he denies chest pain.   During that evaluation, I recommended he undergo comprehensive fasting laboratory, a 2D echo Doppler study I will as well as an initial calcium score to assess for Karrar.  Calcification of an aortic sclerosis.  I also recommended he undergo abdominal aortic ultrasound with his family history for aneurysm and scheduled him to wear a 2-week Zio patch monitor.  Laboratory from June 18, 2022 was normal.  Creatinine was 0.68.  Glucose 95.  He had normal TSH and lipid studies were excellent with LDL cholesterol at 63 and total cholesterol 143 with triglycerides 68.  A 2D echo Doppler study on June 22, 2022 showed normal LV function with EF 60 to 65%, mild LVH, normal RV pressure.  There was mild aortic sclerosis without stenosis.  There was borderline dilatation of his aortic root at 38 mm.  On June 25, 2022, abdominal ultrasound did not show any evidence for abdominal aortic aneurysm.  There was mild dilation of his right common iliac artery, left common iliac artery, right external iliac artery, and left external iliac artery.  CT cardiac scoring was done on July 11, 2022 which showed a calcium score of 1.37 representing the 11th percentile.  There was  minimal 1.37 calcification in the LAD.  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 He wore a 2-week Zio patch monitor which showed the predominant rhythm is sinus rhythm with an average rate at 77.  The slowest rate was sinus bradycardia at 46 bpm which occurred while sleeping.  The maximum rate was sinus tachycardia 134 which occurred at 5:23 PM.  He had 1 5 beat episode of nonsustained VT with an average rate at 181 (maximum 203).  He had occasional short bursts of SVT with the fastest interval lasting 5 beats with a maximum rate at 197 and the  longest interval lasting 2 minutes and 33 seconds at a rate of 168.  There were rare PACs and PVCs.  There was transient ventricular bigeminy and trigeminy with a rare ventricular couplet and triplet.  There were no episodes of atrial fibrillation or significant pauses.  Presently, Russell [name] feels well.  He admits to leg weakness.  He denies any presyncope.  At times he is concerned about possible panic attacks which may be associated with his increased heart rate.  He has been drinking 4 cups of coffee per day and on Sundays typically drinks an entire pot of coffee.  He denies any chest tightness PND orthopnea or claudication.  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   anterior cer disc and fusion with plates W0-3 and C4-5 at [facility]   Constipation    Encounter for general adult medical examination with abnormal findings 06/27/2022   Enlarged prostate    GERD (gastroesophageal reflux disease)    Hemorrhoids    History of kidney stones    passed stones   History of lumbar laminectomy 05/12/2020   open L2-L5 at [facility]   Hx of colonic polyp 05/29/2018   05/2018 cecal ssp   Lung nodule 02/2021   Maxillary sinus polyp    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   Prolapsed internal hemorrhoids, grade 3 09/10/2006   Qualifier: Diagnosis of  By: Erby Pian MD, Cornelius     Salivary gland enlargement    Sleep apnea 10 years ago   declined CPAP   Stroke (HCC) 15-20 years ago    Past Surgical History:  Procedure Laterality Date   BRONCHIAL BIOPSY  05/16/2021   Procedure: BRONCHIAL BIOPSIES;  Surgeon: Josephine Igo, DO;  Location: [name];  Service: Pulmonary;;   BRONCHIAL BRUSHINGS  05/16/2021    Procedure: BRONCHIAL BRUSHINGS;  Surgeon: Josephine Igo, DO;  Location: [name];  Service: Pulmonary;;   BRONCHIAL NEEDLE ASPIRATION BIOPSY  05/16/2021   Procedure: BRONCHIAL NEEDLE ASPIRATION BIOPSIES;  Surgeon: Josephine Igo, DO;  Location: [name];  Service: Pulmonary;;   CERVICAL SPINE SURGERY  04/10/2020   C3-C4 with pin at [facility]   COLONOSCOPY     FIDUCIAL MARKER PLACEMENT  05/16/2021   Procedure: FIDUCIAL MARKER PLACEMENT;  Surgeon: Josephine Igo, DO;  Location: [name];  Service: Pulmonary;;   HEMORRHOID BANDING     LUMBAR LAMINECTOMY  03/08/2010   L2-3, cauda equina syndrome   lumbar laminectomy  05/12/2020   [facility]   SPINE  SURGERY     UPPER GI ENDOSCOPY     VIDEO BRONCHOSCOPY WITH ENDOBRONCHIAL NAVIGATION Left 05/16/2021   Procedure: VIDEO BRONCHOSCOPY WITH ENDOBRONCHIAL NAVIGATION;  Surgeon: Josephine Igo, DO;  Location: [name];  Service: Pulmonary;  Laterality: Left;  ION w/ fiducial placement   VIDEO BRONCHOSCOPY WITH RADIAL ENDOBRONCHIAL ULTRASOUND  05/16/2021   Procedure: VIDEO BRONCHOSCOPY WITH RADIAL ENDOBRONCHIAL ULTRASOUND;  Surgeon: Josephine Igo, DO;  Location: [name];  Service: Pulmonary;;   wisdom teeth ext      Current Medications: Outpatient Medications Prior to Visit  Medication Sig Dispense Refill   Acetaminophen 500 MG capsule      aspirin 81 MG EC tablet Take 81 mg by mouth every evening.     cyclobenzaprine (FLEXERIL) 10 MG tablet Take 1 tablet (10 mg total) by mouth at bedtime. 30 tablet 5   Docusate Sodium (DSS) 100 MG CAPS      finasteride (PROSCAR) 5 MG tablet Take 1 tablet (5 mg total) by mouth daily. 90 tablet 3   gabapentin (NEURONTIN) 300 MG capsule Take 1 capsule (300 mg total) by mouth 2 (two) times daily. 180 capsule 1   hydrocortisone (ANUSOL-HC) 2.5 % rectal cream Place 1 application. rectally 2 (two) times daily. (Patient taking differently: Place 1 application  rectally 3 times/day as needed-between meals  & bedtime.) 30 g 1   Magnesium 400 MG CAPS Take 400 mg by mouth 2 (two) times daily. asporotate     Melatonin 5 MG/ML LIQD Take 5 mg by mouth daily.     metroNIDAZOLE (METROGEL) 0.75 % gel Apply to face daily for rosacea 45 g 4   Omega-3 Fatty Acids (FISH OIL) 1200 MG CAPS Take 1,200 mg by mouth daily.     omeprazole (PRILOSEC) 40 MG capsule TAKE (1) CAPSULE BY MOUTH ONCE DAILY. 90 capsule 3   polyethylene glycol powder (GLYCOLAX/MIRALAX) 17 GM/SCOOP powder Take 0.5 Containers by mouth daily as needed for moderate constipation or mild constipation.     Probiotic Product (PROBIOTIC DAILY PO) Take 1 tablet by mouth 2 (two) times daily.     senna-docusate (SENOKOT-S) 8.6-50 MG tablet Take 2 tablets by mouth at bedtime as needed for mild constipation.     silodosin (RAPAFLO) 8 MG CAPS capsule Take 1 capsule (8 mg total) by mouth 2 (two) times daily. 30 capsule 11   TURMERIC PO Take 750 mg by mouth daily. curamed curcumin     benzonatate (TESSALON) 100 MG capsule Take 1 capsule (100 mg total) by mouth 2 (two) times daily as needed for cough. 20 capsule 0   No facility-administered medications prior to visit.     Allergies:   Patient has no known allergies.   Social History   Socioeconomic History   Marital status: Married    Spouse name: Not on file   Number of children: 0   Years of education: Not on file   Highest education level: Not on file  Occupational History   Occupation: retired  Tobacco Use   Smoking status: Former    Packs/day: 0.25    Years: 9.00    Total pack years: 2.25    Types: Cigarettes    Start date: 10/15/1965    Quit date: 10/16/1974    Years since quitting: 47.8    Passive exposure: Past   Smokeless tobacco: Never  Vaping Use   Vaping Use: Never used  Substance and Sexual Activity   Alcohol use: Not Currently    Comment: rarely wine  Drug use: No   Sexual activity: Not Currently  Other Topics Concern   Not on file  Social History Narrative   He is  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   Social Determinants of Health   Financial Resource Strain: Low Risk  (01/31/2022)   Overall Financial Resource Strain (CARDIA)    Difficulty of Paying Living Expenses: Not hard at all  Food Insecurity: No Food Insecurity (01/31/2022)   Hunger Vital Sign    Worried About Running Out of Food in the Last Year: Never true    Ran Out of Food in the Last Year: Never true  Transportation Needs: No Transportation Needs (01/31/2022)   PRAPARE - Administrator, Civil Service (Medical): No    Lack of Transportation (Non-Medical): No  Physical Activity: Sufficiently Active (01/31/2022)   Exercise Vital Sign    Days of Exercise per Week: 7 days    Minutes of Exercise per Session: 60 min  Stress: No Stress Concern Present (01/31/2022)   [employer] of Occupational Health - Occupational Stress Questionnaire    Feeling of Stress : Not at all  Social Connections: Moderately Isolated (01/31/2022)   Social Connection and Isolation Panel [NHANES]    Frequency of Communication with Friends and Family: More than three times a week    Frequency of Social Gatherings with Friends and Family: More than three times a week    Attends Religious Services: Never    Database administrator or Organizations: No    Attends Banker Meetings: Never    Marital Status: Married    Socially he was born in [place] near [place].  He is married for 53 years.  No children.  He is retired.  He does not drink alcohol.  He walks approximately 6 days a week.  His wife was diagnosed with ovarian cancer and is undergoing chemotherapy at [facility].  Family History:  The patient's family history includes Aortic aneurysm in his mother, sister, sister, and sister; Arthritis in  his father; CVA in his sister; Early death in his sister; Heart attack in his father; Varicose Veins in his mother.   ROS General: Negative; No fevers, chills, or night sweats;  HEENT: Negative; No changes in vision or hearing, sinus congestion, difficulty swallowing Pulmonary: Negative; No cough, wheezing, shortness of breath, hemoptysis Cardiovascular: Negative; No chest pain, presyncope, syncope, palpitations GI: Negative; No nausea, vomiting, diarrhea, or abdominal pain GU: Negative; No dysuria, hematuria, or difficulty voiding Musculoskeletal: Sciatica symptoms left leg Hematologic/Oncology: Negative; no easy bruising, bleeding Endocrine: Negative; no heat/cold intolerance; no diabetes Neuro: Negative; no changes in balance, headaches Skin: Negative; No rashes or skin lesions Psychiatric: Negative; No behavioral problems, depression Sleep: Negative; No snoring, daytime sleepiness, hypersomnolence, bruxism, restless legs, hypnogognic hallucinations, no cataplexy Other comprehensive 14 point system review is negative.   PHYSICAL EXAM:   VS:  BP 122/74   Pulse 61   Ht 6' (1.829 m)   Wt 188 lb 6.4 oz (85.5 kg)   SpO2 99%   BMI 25.55 kg/m     Repeat blood pressure by me was 120/70 supine and 120/68 standing  Wt Readings from Last 3 Encounters:  08/07/22 188 lb 6.4 oz (85.5 kg)  07/19/22 188  lb 0.6 oz (85.3 kg)  06/27/22 187 lb (84.8 kg)    General: Alert, oriented, no distress.  Skin: normal turgor, no rashes, warm and dry HEENT: Normocephalic, atraumatic. Pupils equal round and reactive to light; sclera anicteric; extraocular muscles intact;  Nose without nasal septal hypertrophy Mouth/Parynx benign; Mallinpatti scale Neck: No JVD, no carotid bruits; normal carotid upstroke Lungs: clear to ausculatation and percussion; no wheezing or rales Chest wall: without tenderness to palpitation Heart: PMI not displaced, RRR, s1 s2 normal, 1/6 systolic murmur, no diastolic murmur, no  rubs, gallops, thrills, or heaves Abdomen: soft, nontender; no hepatosplenomehaly, BS+; abdominal aorta nontender and not dilated by palpation. Back: no CVA tenderness Pulses 2+ Musculoskeletal: full range of motion, normal strength, no joint deformities Extremities: no clubbing cyanosis or edema, Homan's sign negative  Neurologic: grossly nonfocal; Cranial nerves grossly wnl Psychologic: Normal mood and affect   Studies/Labs Reviewed:   August 07, 2022 ECG (independently read by me): NSR at 61, no ectopy, normal intervals  December 4., 2023 ECG (independently read by me): Sinus bradycardia at 59 with mild sinus arrythmia  Recent Labs:    Latest Ref Rng & Units 06/18/2022   11:45 AM 05/16/2021    7:47 AM 09/28/2019    2:18 PM  BMP  Glucose 70 - 99 mg/dL 95  97  78   BUN 8 - 27 mg/dL 13  18  16    Creatinine 0.76 - 1.27 mg/dL 0.68  0.68  0.69   BUN/Creat Ratio 10 - 24 19     Sodium 134 - 144 mmol/L 140  137  139   Potassium 3.5 - 5.2 mmol/L 4.8  3.8  3.9   Chloride 96 - 106 mmol/L 102  105  102   CO2 20 - 29 mmol/L 24  25  30    Calcium 8.6 - 10.2 mg/dL 9.3  8.9  9.2         Latest Ref Rng & Units 06/18/2022   11:45 AM 09/28/2019    2:18 PM 02/10/2009    8:17 PM  Hepatic Function  Total Protein 6.0 - 8.5 g/dL 6.8  6.9  6.8   Albumin 3.8 - 4.8 g/dL 4.2  4.2  4.2   AST 0 - 40 IU/L 17  21  19    ALT 0 - 44 IU/L 14  16  15    Alk Phosphatase 44 - 121 IU/L 62  44  41   Total Bilirubin 0.0 - 1.2 mg/dL 0.3  0.6  1.1        Latest Ref Rng & Units 05/16/2021    7:47 AM 09/28/2019    2:18 PM 06/13/2018   10:20 AM  CBC  WBC 4.0 - 10.5 K/uL 5.0  5.9  13.8   Hemoglobin 13.0 - 17.0 g/dL 12.7  12.7  12.9   Hematocrit 39.0 - 52.0 % 40.5  39.3  41.4   Platelets 150 - 400 K/uL 201  207.0  212    Lab Results  Component Value Date   MCV 81.3 05/16/2021   MCV 79.2 09/28/2019   MCV 79.6 (L) 06/13/2018   Lab Results  Component Value Date   TSH 1.460 06/18/2022   Lab Results   Component Value Date   HGBA1C 5.6 09/29/2019     BNP No results found for: "BNP"  ProBNP No results found for: "PROBNP"   Lipid Panel     Component Value Date/Time   CHOL 143 06/18/2022 1145   TRIG 68 06/18/2022  1145   HDL 66 06/18/2022 1145   CHOLHDL 2.2 06/18/2022 1145   CHOLHDL 2.9 Ratio 02/10/2009 2017   VLDL 15 02/10/2009 2017   LDLCALC 63 06/18/2022 1145   LABVLDL 14 06/18/2022 1145     RADIOLOGY: LONG TERM MONITOR (3-14 DAYS)  Result Date: 07/17/2022 Patch Wear Time:  12 days and 23 hours (2023-12-10T10:21:25-0500 to 2023-12-23T09:37:25-0500) Patient had a min HR of 37 bpm, max HR of 203 bpm, and avg HR of 77 bpm. Predominant underlying rhythm was Sinus Rhythm. 1 run of Ventricular Tachycardia occurred lasting 5 beats with a max rate of 203 bpm (avg 181 bpm). 158 Supraventricular Tachycardia runs occurred, the run with the fastest interval lasting 5 beats with a max rate of 197 bpm, the longest lasting 2 mins 33 secs with an avg rate of 168 bpm. True duration of Supraventricular Tachycardia difficult to ascertain due to artifact. Supraventricular Tachycardia was detected within +/- 45 seconds of symptomatic patient event(s). Isolated SVEs were frequent (5.6%, 78559), SVE Couplets were rare (<1.0%, 1666), and SVE Triplets were rare (<1.0%, 199). Isolated VEs were rare (<1.0%, 13668), VE Couplets were rare (<1.0%, 134), and VE Triplets were rare (<1.0%, 5). Ventricular Bigeminy and Trigeminy were present. The predominant rhythm was sinus rhythm with an average rate at 77 bpm.  The slowest sinus rate was sinus bradycardia at 46 bpm which occurred at 4:42 AM on December 20.  The maximum sinus rhythm was sinus tachycardia at 134 bpm which occurred at 5:23 PM.  There was 1 episode of nonsustained ventricular tachycardia of 5 beats with an average rate at 181 (max 203).  There were occasional short bursts of SVT with the fastest interval lasting 5 beats with a maximum rate of 197 and  the longest interval lasting 2 minutes and 33 seconds at a rate of 168 bpm.  There were rare PACs and PVCs. Transient ventricular bigeminy and trigeminy was detected with rare ventricular couplet and triplet.  There were no episodes of atrial fibrillation or significant pauses.      Additional studies/ records that were reviewed today include:   I reviewed the records from [facility] health system as well as Russell [name]  I reviewed the echo Doppler, abdominal ultrasound, chest CT over read as noted above.  ZIO PATCH Patient had a min HR of 37 bpm, max HR of 203 bpm, and avg HR of 77 bpm. Predominant underlying rhythm was Sinus Rhythm. 1 run of Ventricular Tachycardia occurred lasting 5 beats with a max rate of 203 bpm (avg 181 bpm). 158 Supraventricular Tachycardia runs occurred, the run with the fastest interval lasting 5 beats with a max rate of 197 bpm, the longest lasting 2 mins 33 secs with an avg rate of 168 bpm. True duration of Supraventricular Tachycardia difficult to ascertain due to artifact. Supraventricular Tachycardia was detected within +/- 45 seconds of symptomatic patient event(s). Isolated SVEs were frequent (5.6%, 78559), SVE Couplets were rare (<1.0%, 1666), and SVE Triplets were rare (<1.0%, 199). Isolated VEs were rare (<1.0%, 13668), VE Couplets were rare (<1.0%, 134), and VE Triplets were rare (<1.0%, 5). Ventricular Bigeminy and Trigeminy were present.      ASSESSMENT:    1. Palpitations   2. Family history of aortic aneurysm   3. SVT (supraventricular tachycardia)   4. Aortic valve sclerosis   5. Agatston coronary artery calcium score less than 100   6. UIP (usual interstitial pneumonitis) (HCC)   7. History of lung cancer  PLAN:  Russell [name] is a 77 year old male who has a history of remote tobacco use and smoked from 1968 in 1976.  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  He subsequently was found to have adenocarcinoma  of his left lower lung and subsequently underwent lobar resection.  He has family history for coronary artery disease with a sister having undergone CABG revascularization.  His mother also had abdominal aortic aneurysm recently, he has noticed a dry cough.  When I initially evaluated him on June 18, 2022, he had experienced 3 weeks of episodic palpitations.  He subsequently underwent comprehensive laboratory which essentially was normal.  Lipid studies were excellent with an LDL of 63.  A 2D echo Doppler study was essentially normal and showed EF 60 to 65%.  There was mild LVH.  Triglycerides were 63 on omega-3 fatty acid.  RV pressure was normal.  There was evidence for mild aortic sclerosis without stenosis and borderline dilatation of his aortic root at 38 mm.  His abdominal ultrasound did not reveal any abdominal aortic aneurysm and mild ectasia was noted in the left common iliac artery, right external iliac artery, and left external iliac artery.  Coronary calcium score was 1.37 Agatston units with minimal plaque in the LAD.  I thoroughly reviewed his Zio patch monitor.  His predominant rhythm was sinus rhythm with an average rate at 77 bpm.  Slowest sinus bradycardia was 46 bpm while sleeping and the fastest sinus tachycardia was 134 bpm which occurred in the evening.  He had one 5 beat episode of nonsustained VT with an average rate at 181 and had occasional short bursts of SVT with the fastest interval lasting 5 beats with a maximum rate at 197 and the longest interval lasting 2 minutes and 33 seconds at a rate of 168.  He admits that he has been drinking at least 4 cups of coffee per day and typically drinks a whole pot of coffee on 6 Sunday.  I discussed the importance of caffeine restriction.  Presently, I am recommending the addition of metoprolol succinate 25 mg which can be further titrated to 50 mg as needed if recurrent palpitations develop.  He admits that he has panic attacks which the  increased sympathetic tone can be contributing to some of his palpitations and ectopy.  Lipid studies are excellent and he is on omega-3 fatty acid.  His chest CT over read did suggest some fibrotic changes raising concern for UIP.  If he notes increasing shortness of breath pulmonary follow-up would be recommended.  I will see him in 6 months for follow-up evaluation or sooner as needed.   Medication Adjustments/Labs and Tests Ordered: Current medicines are reviewed at length with the patient today.  Concerns regarding medicines are outlined above.  Medication changes, Labs and Tests ordered today are listed in the Patient Instructions below. Patient Instructions  Medication Instructions:   START METOPROLOL SUCC ER 25 MG ONCE DAILY  *If you need a refill on your cardiac medications before your next appointment, please call your pharmacy*   Follow-Up: At [facility], you and your health needs are our priority.  As part of our continuing mission to provide you with exceptional heart care, we have created designated Provider Care Teams.  These Care Teams include your primary Cardiologist (physician) and Advanced Practice Providers (APPs -  Physician Assistants and Nurse Practitioners) who all work together to provide you with the care you need, when  you need it.  We recommend signing up for the patient portal called "MyChart".  Sign up information is provided on this After Visit Summary.  MyChart is used to connect with patients for Virtual Visits (Telemedicine).  Patients are able to view lab/test results, encounter notes, upcoming appointments, etc.  Non-urgent messages can be sent to your provider as well.   To learn more about what you can do with MyChart, go to [email or web address].    Your next appointment:   6 month(s)  Provider:   [name], MD        Signed, Russell Guadalajara, MD  08/12/2022 9:23 AM    [facility] [address],  [place], [place]  [address] Phone: [phone]

## 2022-08-12 ENCOUNTER — Encounter: Payer: Self-pay | Admitting: Cardiovascular Disease

## 2022-08-31 ENCOUNTER — Encounter: Payer: Self-pay | Admitting: Physical Medicine and Rehabilitation

## 2022-08-31 ENCOUNTER — Encounter: Payer: Medicare PPO | Attending: Physical Medicine and Rehabilitation | Admitting: Physical Medicine and Rehabilitation

## 2022-08-31 VITALS — BP 120/77 | HR 65 | Ht 72.0 in | Wt 191.0 lb

## 2022-08-31 DIAGNOSIS — M5416 Radiculopathy, lumbar region: Secondary | ICD-10-CM

## 2022-08-31 DIAGNOSIS — N319 Neuromuscular dysfunction of bladder, unspecified: Secondary | ICD-10-CM | POA: Insufficient documentation

## 2022-08-31 DIAGNOSIS — M21371 Foot drop, right foot: Secondary | ICD-10-CM

## 2022-08-31 DIAGNOSIS — M21372 Foot drop, left foot: Secondary | ICD-10-CM | POA: Diagnosis not present

## 2022-08-31 DIAGNOSIS — M792 Neuralgia and neuritis, unspecified: Secondary | ICD-10-CM

## 2022-08-31 DIAGNOSIS — G834 Cauda equina syndrome: Secondary | ICD-10-CM | POA: Diagnosis not present

## 2022-08-31 MED ORDER — GABAPENTIN 300 MG PO CAPS: 300.0000 mg | ORAL_CAPSULE | Freq: Three times a day (TID) | ORAL | 1 refills | Status: DC

## 2022-08-31 NOTE — Progress Notes (Deleted)
   Subjective:    Patient ID: Russell [name], male    DOB: 21-May-1946, 77 y.o.   MRN: [id number]  HPI    Review of Systems     Objective:   Physical Exam        Assessment & Plan:

## 2022-08-31 NOTE — Progress Notes (Signed)
Subjective:    Patient ID: Russell [name], male    DOB: 06/01/1946, 77 y.o.   MRN: [id number]  HPI Pt is a 77 yr old male with hx of lung CA- s/p L lower lobectomy and cauda equina with saddle anesthesia, neurogenic bladder; no DM; has HTN;   Here for evaluation for Cauda equina syndrome  Part of L lung removed- due to lung CA- January 2023  Dr Cyndy Freeze- L4/5- Lami/fusion-2011  Dr Ardith Dark- last 2 surgeries-  2021- L2-L5- has had problems ever since then Cervical lami/fusion- 2021- C4-C6- earlier than L2-L5   Had EMG/NCS - L4-S1- that are causing problems.   Has saddle anesthesia- came out of last surgery with it- but now has spread.  Spread to T10 and half of genitals and rectum- spread of backside of L leg and L buttocks  And nerve pain going down L leg.   Has constant numbness/tingling Morphing pain in L hamstring- from L posterior knee to piriformis 4 months ago- pain was stabbing on fire Now burning pain- slightly better.   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  Pain getting less; but muscle strength is getting worse.  Had extreme pain at night, was crying.  Prior- now; Has episodes of severe pain that spikes- needs wife to get dressed at that time.    Muscles weaker R foot- has R foot drop Collapsed arch on R side as well  Used to do highly vigorous exercise until 1 year before 2021 surgery-  Due to collapsed arch.     Chiropractor dx'd L piriformis syndrome and L hamstring pain.  In last 6 months.   Pain morphs over the day-  In afternoon, cannot sit in chair. Can in AM.  This Am, can drive car, by afternoon, uses TENS unit.   Has muscle spasms/cramping but no jerking/visible spasms.  Leg cramps at night from hard physical exercise.   L hamstring "will swell"- and muscle is easily to cause shooting pain up on L low  back.  If standing is fine Can stop pain by laying flat on stomach.   Coming out of severe episode.    Gabapentin 300 mg BID Flexeril- 10 mg  Doesn't take Tylenol unless really in bad shape-  not sure if helps or not. Usually 500 mg at a time when does it.   Dr Jaquita Folds- Rheum-  Candied ginger- falling off taking [place]- falling off taking them If takes it every day, has more flexibility and less pain.  Not driving to [facility] for swimming like recommended  Bladder-  Sees Dr Nicolette Bang- Urology-  Started getting bladder function after foley for 9 weeks-  On Proscar for Prostate-  (PSA elevated- has prostate biopsy) When pees, never empties- so waits- and does double to triple void.  Relaxes body and then able to get more out.  No UTIs lately- last one right after biopsy 2 years ago.  Bowel - goes form constipated to diarrhea- takes miralax and senna for days and then too soft-  No bowel accidents In constipation, 2-3x/day not much, but in general goes every day  Social Hx: Having bathroom remodeled new handicapped bathroom and [employer] everywhere, flip down seat in shower, etc Will be w/c accessible Ramp and has Merchandiser, retail with cane- when in town- doesn't use in house  or flat Uses hiking sticks- x2 to walk on uneven ground/hiking Uses EV to get around farm Sits in w/c due to buttock pain and hamstring pain.  Has a big hard pillow in w/c- but not pressure relieving cushion.  Can walk 1 mile easily, but exhausted afterwards and needs ot lay down.     Pain Inventory Average Pain 4 Pain Right Now 0 My pain is  N/A  In the last 24 hours, has pain interfered with the following? General activity 3 Relation with others 3 Enjoyment of life 3 What TIME of day is your pain at its worst? evening Sleep (in general) Fair  Pain is worse with: bending Pain improves with: rest and TENS Relief from Meds: 3  walk without assistance use a  cane  retired  bladder control problems bowel control problems weakness numbness trouble walking  N/A  NEW PATIENT    Family History  Problem Relation Age of Onset   Aortic aneurysm Mother    Varicose Veins Mother    Heart attack Father    Arthritis Father    Aortic aneurysm Sister    Early death Sister    CVA Sister    Aortic aneurysm Sister    Aortic aneurysm Sister    Colon cancer Neg Hx    Pancreatic cancer Neg Hx    Rectal cancer Neg Hx    Stomach cancer Neg Hx    Esophageal cancer Neg Hx    Colon polyps Neg Hx    Social History   Socioeconomic History   Marital status: Married    Spouse name: Not on file   Number of children: 0   Years of education: Not on file   Highest education level: Not on file  Occupational History   Occupation: retired  Tobacco Use   Smoking status: Former    Packs/day: 0.25    Years: 9.00    Total pack years: 2.25    Types: Cigarettes    Start date: 10/15/1965    Quit date: 10/16/1974    Years since quitting: 47.9    Passive exposure: Past   Smokeless tobacco: Never  Vaping Use   Vaping Use: Never used  Substance and Sexual Activity   Alcohol use: Not Currently    Comment: rarely wine   Drug use: No   Sexual activity: Not Currently  Other Topics Concern   Not on file  Social History Narrative   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   Social Determinants of Health   Financial Resource Strain: Low Risk  (01/31/2022)   Overall Financial Resource Strain (CARDIA)    Difficulty of Paying Living Expenses: Not hard at all  Food Insecurity: No Food Insecurity (01/31/2022)   Hunger Vital Sign    Worried About Running Out of Food in the Last Year: Never true    Ran Out of Food in the Last Year: Never true  Transportation Needs: No Transportation  Needs (01/31/2022)   PRAPARE - Hydrologist (Medical): No    Lack of Transportation (Non-Medical): No  Physical Activity: Sufficiently Active (01/31/2022)   Exercise Vital Sign    Days of Exercise per Week: 7 days    Minutes of Exercise per Session: 60  min  Stress: No Stress Concern Present (01/31/2022)   [place]    Feeling of Stress : Not at all  Social Connections: Moderately Isolated (01/31/2022)   Social Connection and Isolation Panel [NHANES]    Frequency of Communication with Friends and Family: More than three times a week    Frequency of Social Gatherings with Friends and Family: More than three times a week    Attends Religious Services: Never    Marine scientist or Organizations: No    Attends Archivist Meetings: Never    Marital Status: Married   Past Surgical History:  Procedure Laterality Date   BRONCHIAL BIOPSY  05/16/2021   Procedure: BRONCHIAL BIOPSIES;  Surgeon: Garner Nash, DO;  Location: [place] [facility];  Service: Pulmonary;;   BRONCHIAL BRUSHINGS  05/16/2021   Procedure: BRONCHIAL BRUSHINGS;  Surgeon: Garner Nash, DO;  Location: [place];  Service: Pulmonary;;   BRONCHIAL NEEDLE ASPIRATION BIOPSY  05/16/2021   Procedure: BRONCHIAL NEEDLE ASPIRATION BIOPSIES;  Surgeon: Garner Nash, DO;  Location: [place] [facility];  Service: Pulmonary;;   CERVICAL SPINE SURGERY  04/10/2020   C3-C4 with pin at [facility]   COLONOSCOPY     FIDUCIAL MARKER PLACEMENT  05/16/2021   Procedure: FIDUCIAL MARKER PLACEMENT;  Surgeon: Garner Nash, DO;  Location: [place] [facility];  Service: Pulmonary;;   HEMORRHOID BANDING     LUMBAR LAMINECTOMY  03/08/2010   L2-3, cauda equina syndrome   lumbar laminectomy  05/12/2020   [facility]   SPINE SURGERY     UPPER GI ENDOSCOPY     VIDEO BRONCHOSCOPY WITH ENDOBRONCHIAL NAVIGATION Left 05/16/2021   Procedure: VIDEO BRONCHOSCOPY WITH  ENDOBRONCHIAL NAVIGATION;  Surgeon: Garner Nash, DO;  Location: [place];  Service: Pulmonary;  Laterality: Left;  ION w/ fiducial placement   VIDEO BRONCHOSCOPY WITH RADIAL ENDOBRONCHIAL ULTRASOUND  05/16/2021   Procedure: VIDEO BRONCHOSCOPY WITH RADIAL ENDOBRONCHIAL ULTRASOUND;  Surgeon: Garner Nash, DO;  Location: [place] [facility];  Service: Pulmonary;;   wisdom teeth ext     Past Medical History:  Diagnosis Date   Anemia 2019   Arachnoiditis    Arthritis    Bleeding internal hemorrhoids 09/10/2006   Qualifier: Diagnosis of  By: Jonna Munro MD, Cornelius     Cancer [facility]) 2022   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[place]) 2011   Cerebellar infarct ([place])    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   anterior cer disc and fusion with plates C3-4 and X9-0 at [facility]   Constipation    Encounter for general adult medical examination with abnormal findings 06/27/2022   Enlarged prostate    GERD (gastroesophageal reflux disease)    Hemorrhoids    History of kidney stones    passed stones   History of lumbar laminectomy 05/12/2020   open L2-L5 at [facility]   Hx of colonic polyp 05/29/2018   05/2018 cecal ssp   Lung nodule 02/2021   Maxillary sinus polyp    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   Prolapsed internal hemorrhoids, grade 3 09/10/2006   Qualifier: Diagnosis of  By: Jonna Munro MD, Cornelius     Salivary gland enlargement    Sleep apnea 10 years ago   declined CPAP   Stroke ([place]) 15-20 years ago   There were no vitals taken for this visit.  Opioid Risk Score:   Fall Risk Score:  `1  Depression screen [facility] 2/9     07/19/2022    1:24  PM 06/27/2022    8:10 AM 01/31/2022    2:56 PM 01/31/2022    2:37 PM 01/31/2022    2:34 PM 01/09/2022   10:26 AM 08/15/2021   10:57 AM  Depression screen PHQ 2/9  Decreased Interest 1 1 0 0 0 0 0  Down, Depressed, Hopeless 0 0 0 0 0 0 0  PHQ - 2 Score 1 1 0 0 0 0 0     Review of Systems  Respiratory:  Positive for cough.        Sleep apnea  Cardiovascular:   Positive for leg swelling.  Gastrointestinal:  Positive for constipation.  Genitourinary:  Positive for decreased urine volume.  All other systems reviewed and are negative.      Objective:   Physical Exam  Awake, alert, appropriate, accompanied by wife, using cane, NAD Has L>R 1st dorsal interossei atrophy MS: Biceps 5-/5; triceps 5-/5; WE 5-/5; Grip 4-/5 on L and 4+/5 on R; FA  5-/5 on R and 4+/5 on L RLE_ HF 4+/5; KE 5-/5; DF 2/5; PF 3+/5 LLE- HF 4+/5; KE 5-/5; DF 3+/5 and PF 4-/5  Neuro: Absent L1-L3 On L  Decreased to light touch in L4-S4 on L Normal to Light touch in Ue's torso  until T12 which has decreased Absent L scrotum and decreased greatly on L S5 No hoffman's B/L No clonus or increased tone  in Ues/LE's.   Gait-  Foot slap B/L But Foot drop on R Mild foot drop on L- slightly crouched gait   No palpation of muscle spasms in L hamstring/but TTP over L piriformis     Assessment & Plan:   Pt is a 77 yr old male with hx of lung CA- s/p L lower lobectomy and cauda equina with saddle anesthesia, neurogenic bladder; no DM; has HTN;   Here for evaluation for Cauda equina syndrome with B/L foot drop and neurogenic pain and bladder.   Doesn't have spasticity  2. Feels like strength is getting worse - would check MRI- esp since had questionable  more weakness- don't have exam to to compare to.  But will wait on MRI for now since doesn't want additional surgery.   3. Doesn't wear AFO hard to get into shoe- so let's not wear; foot up brace [place] Bilaterally- can get on [employer].   4. If don't use it; lose it!  5. Will increase Gabapentin 300 in AM and 600 mg nightly x 1 week, and then increase to 600 mg 2x/day ( or 300 mg in Am and 900 mg at night/afternoon when starting to have issues).   6. Only use Flexeril/Cyclobenzaprine as needed. Can refil in future for pt. As needed  7. Can continue Proscar And Rapaflo- no issues.   8. Try Tart Cherry- 2-3 pills/day and  Tumeric- at least  500 mg 2x/day- candied ginger +/-   9. F/U in 3 months- double appointment- SCI  10 Wait on spinal cord stimulator  I spent a total of   63 minutes on total care today- >50% coordination of care- due to prolonged discussion as detailed above- esp over discussing options for nerve pain

## 2022-08-31 NOTE — Patient Instructions (Addendum)
Pt is a 77 yr old male with hx of lung CA- s/p L lower lobectomy and cauda equina with saddle anesthesia, neurogenic bladder; no DM; has HTN;   Here for evaluation for Cauda equina syndrome  Doesn't have spasticity  2. Feels like strength is getting worse - would check MRI- esp since had questionable  more weakness- don't have exam to to compare to.  But will wait on MRI for now since doesn't want additional surgery.   3. Doesn't wear AFO hard to get into shoe- so let's not wear; foot up brace [place] Bilaterally- can get on [employer].   4. If don't use it; lose it!  5. Will increase Gabapentin 300 in AM and 600 mg nightly x 1 week, and then increase to 600 mg 2x/day ( or 300 mg in Am and 900 mg at night/afternoon when starting to have issues).   6. Only use Flexeril/Cyclobenzaprine as needed. Can refil in future for pt. As needed  7. Can continue Proscar-and Rapaflo no issues.   8. Try Tart Cherry- 2-3 pills/day and Tumeric- at least  500 mg 2x/day- candied ginger +/-   9. F/U in 3 months- double appointment- SCI  10. Wait on [facility] stimulator

## 2022-09-12 DIAGNOSIS — M9902 Segmental and somatic dysfunction of thoracic region: Secondary | ICD-10-CM | POA: Diagnosis not present

## 2022-09-12 DIAGNOSIS — M5442 Lumbago with sciatica, left side: Secondary | ICD-10-CM | POA: Diagnosis not present

## 2022-09-12 DIAGNOSIS — M9903 Segmental and somatic dysfunction of lumbar region: Secondary | ICD-10-CM | POA: Diagnosis not present

## 2022-09-12 DIAGNOSIS — M9905 Segmental and somatic dysfunction of pelvic region: Secondary | ICD-10-CM | POA: Diagnosis not present

## 2022-09-21 ENCOUNTER — Encounter: Payer: Self-pay | Admitting: Physical Medicine and Rehabilitation

## 2022-09-21 DIAGNOSIS — G834 Cauda equina syndrome: Secondary | ICD-10-CM

## 2022-09-21 NOTE — Telephone Encounter (Signed)
Great improvement in pain overall Bladder doing better  Bladder seems to working better as well- emptying better.    Will send to PT at [facility] for strengthening and balance.   Sexual dysfunction also an issue.  Will be seeing Urology in July- see if can try meds for sexual dysfunction as well.   Battles stool issues- stool softener and fiber- too hard or too soft.  Has improved over the last month- hemorrhoids less/no bleeding.   Viagra worked in past, but HA was too bad. Can try Cialis if need be.  Will get in with Urology.   Wife cancer treatment not working anymore- going to go into palliative care possibly.

## 2022-09-24 ENCOUNTER — Ambulatory Visit (HOSPITAL_BASED_OUTPATIENT_CLINIC_OR_DEPARTMENT_OTHER)
Admission: RE | Admit: 2022-09-24 | Discharge: 2022-09-24 | Disposition: A | Discharge status: Home / Self Care | Payer: Medicare PPO | Source: Ambulatory Visit | Attending: Internal Medicine | Admitting: Internal Medicine

## 2022-09-24 ENCOUNTER — Encounter: Payer: Medicare PPO | Admitting: Physical Medicine and Rehabilitation

## 2022-09-24 ENCOUNTER — Encounter: Payer: Self-pay | Admitting: Internal Medicine

## 2022-09-24 ENCOUNTER — Ambulatory Visit: Payer: Medicare PPO | Admitting: Internal Medicine

## 2022-09-24 ENCOUNTER — Encounter (HOSPITAL_BASED_OUTPATIENT_CLINIC_OR_DEPARTMENT_OTHER): Payer: Self-pay

## 2022-09-24 VITALS — BP 125/75 | HR 94 | Resp 16 | Ht 72.0 in | Wt 192.0 lb

## 2022-09-24 DIAGNOSIS — K6389 Other specified diseases of intestine: Secondary | ICD-10-CM | POA: Diagnosis not present

## 2022-09-24 DIAGNOSIS — I824Y1 Acute embolism and thrombosis of unspecified deep veins of right proximal lower extremity: Secondary | ICD-10-CM | POA: Insufficient documentation

## 2022-09-24 DIAGNOSIS — K629 Disease of anus and rectum, unspecified: Secondary | ICD-10-CM | POA: Diagnosis not present

## 2022-09-24 DIAGNOSIS — G8929 Other chronic pain: Secondary | ICD-10-CM | POA: Diagnosis present

## 2022-09-24 DIAGNOSIS — C182 Malignant neoplasm of ascending colon: Secondary | ICD-10-CM | POA: Diagnosis present

## 2022-09-24 DIAGNOSIS — J8410 Pulmonary fibrosis, unspecified: Secondary | ICD-10-CM | POA: Diagnosis not present

## 2022-09-24 DIAGNOSIS — Z79899 Other long term (current) drug therapy: Secondary | ICD-10-CM | POA: Diagnosis not present

## 2022-09-24 DIAGNOSIS — K641 Second degree hemorrhoids: Secondary | ICD-10-CM | POA: Diagnosis not present

## 2022-09-24 DIAGNOSIS — K2100 Gastro-esophageal reflux disease with esophagitis, without bleeding: Secondary | ICD-10-CM | POA: Diagnosis present

## 2022-09-24 DIAGNOSIS — D509 Iron deficiency anemia, unspecified: Secondary | ICD-10-CM | POA: Diagnosis present

## 2022-09-24 DIAGNOSIS — I82890 Acute embolism and thrombosis of other specified veins: Secondary | ICD-10-CM

## 2022-09-24 DIAGNOSIS — C3432 Malignant neoplasm of lower lobe, left bronchus or lung: Secondary | ICD-10-CM | POA: Diagnosis not present

## 2022-09-24 DIAGNOSIS — D62 Acute posthemorrhagic anemia: Secondary | ICD-10-CM | POA: Diagnosis present

## 2022-09-24 DIAGNOSIS — Z7901 Long term (current) use of anticoagulants: Secondary | ICD-10-CM | POA: Diagnosis not present

## 2022-09-24 DIAGNOSIS — N400 Enlarged prostate without lower urinary tract symptoms: Secondary | ICD-10-CM | POA: Diagnosis present

## 2022-09-24 DIAGNOSIS — R195 Other fecal abnormalities: Secondary | ICD-10-CM | POA: Diagnosis not present

## 2022-09-24 DIAGNOSIS — M792 Neuralgia and neuritis, unspecified: Secondary | ICD-10-CM | POA: Diagnosis not present

## 2022-09-24 DIAGNOSIS — K219 Gastro-esophageal reflux disease without esophagitis: Secondary | ICD-10-CM

## 2022-09-24 DIAGNOSIS — K921 Melena: Secondary | ICD-10-CM | POA: Diagnosis present

## 2022-09-24 DIAGNOSIS — G834 Cauda equina syndrome: Secondary | ICD-10-CM | POA: Diagnosis present

## 2022-09-24 DIAGNOSIS — K5909 Other constipation: Secondary | ICD-10-CM | POA: Diagnosis present

## 2022-09-24 DIAGNOSIS — Z87891 Personal history of nicotine dependence: Secondary | ICD-10-CM | POA: Diagnosis not present

## 2022-09-24 DIAGNOSIS — K642 Third degree hemorrhoids: Secondary | ICD-10-CM | POA: Diagnosis not present

## 2022-09-24 DIAGNOSIS — D500 Iron deficiency anemia secondary to blood loss (chronic): Secondary | ICD-10-CM | POA: Diagnosis not present

## 2022-09-24 DIAGNOSIS — D649 Anemia, unspecified: Secondary | ICD-10-CM | POA: Diagnosis not present

## 2022-09-24 DIAGNOSIS — G4730 Sleep apnea, unspecified: Secondary | ICD-10-CM | POA: Diagnosis not present

## 2022-09-24 DIAGNOSIS — M79604 Pain in right leg: Secondary | ICD-10-CM | POA: Diagnosis not present

## 2022-09-24 DIAGNOSIS — K648 Other hemorrhoids: Secondary | ICD-10-CM | POA: Diagnosis present

## 2022-09-24 DIAGNOSIS — Z86010 Personal history of colonic polyps: Secondary | ICD-10-CM | POA: Diagnosis not present

## 2022-09-24 DIAGNOSIS — Z85118 Personal history of other malignant neoplasm of bronchus and lung: Secondary | ICD-10-CM | POA: Diagnosis not present

## 2022-09-24 DIAGNOSIS — D539 Nutritional anemia, unspecified: Secondary | ICD-10-CM | POA: Diagnosis not present

## 2022-09-24 DIAGNOSIS — D490 Neoplasm of unspecified behavior of digestive system: Secondary | ICD-10-CM | POA: Diagnosis not present

## 2022-09-24 DIAGNOSIS — Z8661 Personal history of infections of the central nervous system: Secondary | ICD-10-CM | POA: Diagnosis not present

## 2022-09-24 DIAGNOSIS — Z8711 Personal history of peptic ulcer disease: Secondary | ICD-10-CM | POA: Diagnosis not present

## 2022-09-24 DIAGNOSIS — I8001 Phlebitis and thrombophlebitis of superficial vessels of right lower extremity: Secondary | ICD-10-CM | POA: Diagnosis present

## 2022-09-24 DIAGNOSIS — I82819 Embolism and thrombosis of superficial veins of unspecified lower extremities: Secondary | ICD-10-CM | POA: Diagnosis not present

## 2022-09-24 DIAGNOSIS — R0602 Shortness of breath: Secondary | ICD-10-CM | POA: Diagnosis not present

## 2022-09-24 DIAGNOSIS — Z87442 Personal history of urinary calculi: Secondary | ICD-10-CM | POA: Diagnosis not present

## 2022-09-24 DIAGNOSIS — K7689 Other specified diseases of liver: Secondary | ICD-10-CM | POA: Diagnosis not present

## 2022-09-24 DIAGNOSIS — G4733 Obstructive sleep apnea (adult) (pediatric): Secondary | ICD-10-CM | POA: Diagnosis present

## 2022-09-24 DIAGNOSIS — Z8673 Personal history of transient ischemic attack (TIA), and cerebral infarction without residual deficits: Secondary | ICD-10-CM | POA: Diagnosis not present

## 2022-09-24 DIAGNOSIS — C189 Malignant neoplasm of colon, unspecified: Secondary | ICD-10-CM | POA: Diagnosis not present

## 2022-09-24 DIAGNOSIS — Z7982 Long term (current) use of aspirin: Secondary | ICD-10-CM | POA: Diagnosis not present

## 2022-09-24 DIAGNOSIS — M5416 Radiculopathy, lumbar region: Secondary | ICD-10-CM | POA: Diagnosis present

## 2022-09-24 DIAGNOSIS — I7300 Raynaud's syndrome without gangrene: Secondary | ICD-10-CM | POA: Diagnosis present

## 2022-09-24 MED ORDER — IOHEXOL 350 MG/ML SOLN
100.0000 mL | Freq: Once | INTRAVENOUS | Status: AC | PRN
  Administered 2022-09-24: 60 mL via INTRAVENOUS

## 2022-09-24 MED ORDER — APIXABAN (ELIQUIS) VTE STARTER PACK (10MG AND 5MG): ORAL_TABLET | ORAL | 0 refills | Status: DC

## 2022-09-24 NOTE — Assessment & Plan Note (Signed)
Patient presents with cough as one of his concerns today. Negative workup for DVT and PE. Has SVT. He has had some increase in GERD symptoms. Recommended increasing omeprazole to twice daily for 2 weeks. He can return to once daily dosing after 2 weeks. Follow up if symptoms worsen or fail to improve

## 2022-09-24 NOTE — Patient Instructions (Signed)
Thank you, [name] for allowing [place] to provide your care today.   I have ordered the following labs and test for you:   - US Venous Img Lower Unilateral Right - CT Angio Chest Pulmonary Embolism (PE) W or WO Contrast - BMP8+EGFR - CBC with Differential/Platelet - APIXABAN (ELIQUIS) VTE STARTER PACK ('10MG'$  AND '5MG'$ ); Take as directed on package: start with two-'5mg'$  tablets twice daily for 7 days. On day 8, switch to one-'5mg'$  tablet twice daily.  Dispense: 1 each; Refill: 0    Reminders: GO to have imaging completed. I will follow up with results.     Tamsen Snider, M.D.

## 2022-09-24 NOTE — Assessment & Plan Note (Addendum)
Patient has clot in right leg seen on point-of-care ultrasound will send for formal ultrasound for complete evaluation for DVT.  He has had a cough for the last 3 weeks and some episodes of mild shortness of breath.  So send for CT PE study.    Addendum: Negative for PE and DVT. SVT in R Great Saphenous. We discussed risk benefits of treatment with DOAC. Patient has history of hemorrhoids , but asymptomatic at this time. Intermediate risk for development of DVT given location and recommend 3 months of anticoagulation. Patient will not take Asprin while taking Apixaban. Follow up in 3 months.

## 2022-09-24 NOTE — Progress Notes (Signed)
   HPI:[name] is a 77 y.o. male who presents for evaluation of spot on leg  (Has a very sore spot over a vein on his right thigh ) and Cough (Dry cough x 3 weeks. On and off. Especially before bed he has cough attacks.) . For the details of today's visit, please refer to the assessment and plan.  Physical Exam: Vitals:   09/24/22 1017  BP: 125/75  Pulse: 94  Resp: 16  SpO2: 99%  Weight: 192 lb (87.1 kg)  Height: 6' (1.829 m)     Physical Exam Constitutional:      General: He is not in acute distress.    Appearance: He is not ill-appearing.  HENT:     Nose: No congestion or rhinorrhea.  Cardiovascular:     Rate and Rhythm: Normal rate and regular rhythm.  Pulmonary:     Effort: No respiratory distress.     Breath sounds: No wheezing or rales.  Musculoskeletal:     Comments: Indurated area in right medial thigh, TTP  Skin:    Comments: Skin over induration not red, hot , or swollen    Evaluated with point of care ultrasound. Ultrasound over tender area shows non compressible vein. Traced proximally and non compressible up to femoral saphenous junction. Distally traced just above knee and non compressible. No blood flow seen with augmentation.   Assessment & Plan:   Russell [name] was seen today for spot on leg  and cough.  Superficial vein thrombosis Assessment & Plan: Patient has clot in right leg seen on point-of-care ultrasound will send for formal ultrasound for complete evaluation for DVT.  He has had a cough for the last 3 weeks and some episodes of mild shortness of breath.  So send for CT PE study.    Addendum: Negative for PE and DVT. SVT in R Great Saphenous. We discussed risk benefits of treatment with DOAC. Patient has history of hemorrhoids , but asymptomatic at this time. Intermediate risk for development of DVT given location and recommend 3 months of anticoagulation. Patient will not take Asprin while taking Apixaban. Follow up in 3 months.    Orders: -     US Venous Img Lower Unilateral Right (DVT) -     CT Angio Chest Pulmonary Embolism (PE) W or WO Contrast -     BMP8+EGFR -     CBC with Differential/Platelet -     Apixaban Starter Pack; Take as directed on package: start with two-5mg  tablets twice daily for 7 days. On day 8, switch to one-5mg  tablet twice daily.  Dispense: 1 each; Refill: 0  Gastroesophageal reflux disease, unspecified whether esophagitis present Overview: Qualifier: Diagnosis of  By: Russell Munro MD, Russell [name]    Assessment & Plan: Patient presents with cough as one of his concerns today. Negative workup for DVT and PE. Has SVT. He has had some increase in GERD symptoms. Recommended increasing omeprazole to twice daily for 2 weeks. He can return to once daily dosing after 2 weeks. Follow up if symptoms worsen or fail to improve        Russell Dy, MD

## 2022-09-25 LAB — CBC WITH DIFFERENTIAL/PLATELET
Basophils Absolute: 0 10*3/uL (ref 0.0–0.2)
Basos: 1 %
EOS (ABSOLUTE): 0 10*3/uL (ref 0.0–0.4)
Eos: 1 %
Hematocrit: 24.6 % — ABNORMAL LOW (ref 37.5–51.0)
Hemoglobin: 7.2 g/dL — ABNORMAL LOW (ref 13.0–17.7)
Immature Grans (Abs): 0 10*3/uL (ref 0.0–0.1)
Immature Granulocytes: 0 %
Lymphocytes Absolute: 1 10*3/uL (ref 0.7–3.1)
Lymphs: 21 %
MCH: 18 pg — ABNORMAL LOW (ref 26.6–33.0)
MCHC: 29.3 g/dL — ABNORMAL LOW (ref 31.5–35.7)
MCV: 62 fL — ABNORMAL LOW (ref 79–97)
Monocytes Absolute: 0.5 10*3/uL (ref 0.1–0.9)
Monocytes: 10 %
Neutrophils Absolute: 3.3 10*3/uL (ref 1.4–7.0)
Neutrophils: 67 %
Platelets: 340 10*3/uL (ref 150–450)
RBC: 3.99 x10E6/uL — ABNORMAL LOW (ref 4.14–5.80)
RDW: 19.5 % — ABNORMAL HIGH (ref 11.6–15.4)
WBC: 4.8 10*3/uL (ref 3.4–10.8)

## 2022-09-25 LAB — BMP8+EGFR
BUN/Creatinine Ratio: 18 (ref 10–24)
BUN: 13 mg/dL (ref 8–27)
CO2: 22 mmol/L (ref 20–29)
Calcium: 9 mg/dL (ref 8.6–10.2)
Chloride: 100 mmol/L (ref 96–106)
Creatinine, Ser: 0.72 mg/dL — ABNORMAL LOW (ref 0.76–1.27)
Glucose: 118 mg/dL — ABNORMAL HIGH (ref 70–99)
Potassium: 4 mmol/L (ref 3.5–5.2)
Sodium: 136 mmol/L (ref 134–144)
eGFR: 96 mL/min/{1.73_m2} (ref >59)

## 2022-09-26 ENCOUNTER — Other Ambulatory Visit: Payer: Self-pay

## 2022-09-26 ENCOUNTER — Other Ambulatory Visit: Payer: Self-pay | Admitting: Internal Medicine

## 2022-09-26 ENCOUNTER — Encounter: Payer: Self-pay | Admitting: Internal Medicine

## 2022-09-26 ENCOUNTER — Telehealth: Payer: Self-pay | Admitting: Internal Medicine

## 2022-09-26 ENCOUNTER — Inpatient Hospital Stay (HOSPITAL_COMMUNITY)
Admission: EM | Admit: 2022-09-26 | Discharge: 2022-09-30 | DRG: 375 | Disposition: A | Discharge status: Home / Self Care | Payer: Medicare PPO | Attending: Internal Medicine | Admitting: Internal Medicine

## 2022-09-26 ENCOUNTER — Ambulatory Visit: Payer: Medicare PPO | Admitting: Internal Medicine

## 2022-09-26 ENCOUNTER — Other Ambulatory Visit (HOSPITAL_COMMUNITY)
Admission: RE | Admit: 2022-09-26 | Discharge: 2022-09-26 | Disposition: A | Discharge status: Home / Self Care | Payer: Medicare PPO | Source: Ambulatory Visit | Attending: Internal Medicine | Admitting: Internal Medicine

## 2022-09-26 VITALS — BP 92/54 | HR 94 | Resp 17 | Ht 72.0 in | Wt 192.0 lb

## 2022-09-26 DIAGNOSIS — Z8661 Personal history of infections of the central nervous system: Secondary | ICD-10-CM

## 2022-09-26 DIAGNOSIS — N400 Enlarged prostate without lower urinary tract symptoms: Secondary | ICD-10-CM | POA: Diagnosis present

## 2022-09-26 DIAGNOSIS — Z8711 Personal history of peptic ulcer disease: Secondary | ICD-10-CM

## 2022-09-26 DIAGNOSIS — Z636 Dependent relative needing care at home: Secondary | ICD-10-CM

## 2022-09-26 DIAGNOSIS — M792 Neuralgia and neuritis, unspecified: Secondary | ICD-10-CM | POA: Diagnosis present

## 2022-09-26 DIAGNOSIS — D62 Acute posthemorrhagic anemia: Secondary | ICD-10-CM | POA: Diagnosis present

## 2022-09-26 DIAGNOSIS — Z823 Family history of stroke: Secondary | ICD-10-CM

## 2022-09-26 DIAGNOSIS — D539 Nutritional anemia, unspecified: Secondary | ICD-10-CM

## 2022-09-26 DIAGNOSIS — G4733 Obstructive sleep apnea (adult) (pediatric): Secondary | ICD-10-CM | POA: Diagnosis present

## 2022-09-26 DIAGNOSIS — Z7901 Long term (current) use of anticoagulants: Secondary | ICD-10-CM

## 2022-09-26 DIAGNOSIS — Z902 Acquired absence of lung [part of]: Secondary | ICD-10-CM

## 2022-09-26 DIAGNOSIS — D500 Iron deficiency anemia secondary to blood loss (chronic): Secondary | ICD-10-CM

## 2022-09-26 DIAGNOSIS — Z85118 Personal history of other malignant neoplasm of bronchus and lung: Secondary | ICD-10-CM

## 2022-09-26 DIAGNOSIS — M5416 Radiculopathy, lumbar region: Secondary | ICD-10-CM | POA: Diagnosis present

## 2022-09-26 DIAGNOSIS — Z8249 Family history of ischemic heart disease and other diseases of the circulatory system: Secondary | ICD-10-CM

## 2022-09-26 DIAGNOSIS — Z8261 Family history of arthritis: Secondary | ICD-10-CM

## 2022-09-26 DIAGNOSIS — Z87891 Personal history of nicotine dependence: Secondary | ICD-10-CM

## 2022-09-26 DIAGNOSIS — I8001 Phlebitis and thrombophlebitis of superficial vessels of right lower extremity: Secondary | ICD-10-CM | POA: Diagnosis present

## 2022-09-26 DIAGNOSIS — Z7982 Long term (current) use of aspirin: Secondary | ICD-10-CM

## 2022-09-26 DIAGNOSIS — Z8673 Personal history of transient ischemic attack (TIA), and cerebral infarction without residual deficits: Secondary | ICD-10-CM

## 2022-09-26 DIAGNOSIS — K219 Gastro-esophageal reflux disease without esophagitis: Secondary | ICD-10-CM | POA: Diagnosis present

## 2022-09-26 DIAGNOSIS — Z79899 Other long term (current) drug therapy: Secondary | ICD-10-CM

## 2022-09-26 DIAGNOSIS — Z87442 Personal history of urinary calculi: Secondary | ICD-10-CM

## 2022-09-26 DIAGNOSIS — I82890 Acute embolism and thrombosis of other specified veins: Secondary | ICD-10-CM | POA: Diagnosis not present

## 2022-09-26 DIAGNOSIS — K648 Other hemorrhoids: Secondary | ICD-10-CM | POA: Diagnosis present

## 2022-09-26 DIAGNOSIS — C182 Malignant neoplasm of ascending colon: Principal | ICD-10-CM | POA: Diagnosis present

## 2022-09-26 DIAGNOSIS — K642 Third degree hemorrhoids: Secondary | ICD-10-CM | POA: Diagnosis present

## 2022-09-26 DIAGNOSIS — D649 Anemia, unspecified: Secondary | ICD-10-CM

## 2022-09-26 DIAGNOSIS — K5909 Other constipation: Secondary | ICD-10-CM | POA: Diagnosis present

## 2022-09-26 DIAGNOSIS — I7300 Raynaud's syndrome without gangrene: Secondary | ICD-10-CM | POA: Diagnosis present

## 2022-09-26 DIAGNOSIS — G834 Cauda equina syndrome: Secondary | ICD-10-CM | POA: Diagnosis present

## 2022-09-26 DIAGNOSIS — G8929 Other chronic pain: Secondary | ICD-10-CM | POA: Diagnosis present

## 2022-09-26 DIAGNOSIS — K6389 Other specified diseases of intestine: Secondary | ICD-10-CM | POA: Diagnosis present

## 2022-09-26 DIAGNOSIS — D509 Iron deficiency anemia, unspecified: Secondary | ICD-10-CM | POA: Diagnosis present

## 2022-09-26 DIAGNOSIS — Z86010 Personal history of colonic polyps: Secondary | ICD-10-CM

## 2022-09-26 DIAGNOSIS — K2100 Gastro-esophageal reflux disease with esophagitis, without bleeding: Secondary | ICD-10-CM | POA: Diagnosis present

## 2022-09-26 DIAGNOSIS — K921 Melena: Secondary | ICD-10-CM

## 2022-09-26 HISTORY — DX: Acute posthemorrhagic anemia: D62

## 2022-09-26 LAB — RETICULOCYTES
Immature Retic Fract: 24.3 % — ABNORMAL HIGH (ref 2.3–15.9)
RBC.: 3.86 MIL/uL — ABNORMAL LOW (ref 4.22–5.81)
Retic Count, Absolute: 39.8 10*3/uL (ref 19.0–186.0)
Retic Ct Pct: 1 % (ref 0.4–3.1)

## 2022-09-26 LAB — CBC WITH DIFFERENTIAL/PLATELET
Abs Immature Granulocytes: 0.01 10*3/uL (ref 0.00–0.07)
Basophils Absolute: 0 10*3/uL (ref 0.0–0.1)
Basophils Relative: 1 %
Eosinophils Absolute: 0.1 10*3/uL (ref 0.0–0.5)
Eosinophils Relative: 1 %
HCT: 24 % — ABNORMAL LOW (ref 39.0–52.0)
Hemoglobin: 7.1 g/dL — ABNORMAL LOW (ref 13.0–17.0)
Immature Granulocytes: 0 %
Lymphocytes Relative: 33 %
Lymphs Abs: 1.7 10*3/uL (ref 0.7–4.0)
MCH: 18.2 pg — ABNORMAL LOW (ref 26.0–34.0)
MCHC: 29.6 g/dL — ABNORMAL LOW (ref 30.0–36.0)
MCV: 61.4 fL — ABNORMAL LOW (ref 80.0–100.0)
Monocytes Absolute: 0.6 10*3/uL (ref 0.1–1.0)
Monocytes Relative: 12 %
Neutro Abs: 2.8 10*3/uL (ref 1.7–7.7)
Neutrophils Relative %: 53 %
Platelets: 316 10*3/uL (ref 150–400)
RBC: 3.91 MIL/uL — ABNORMAL LOW (ref 4.22–5.81)
RDW: 19.9 % — ABNORMAL HIGH (ref 11.5–15.5)
WBC: 5.2 10*3/uL (ref 4.0–10.5)
nRBC: 0 % (ref 0.0–0.2)

## 2022-09-26 LAB — CBC
HCT: 23.3 % — ABNORMAL LOW (ref 39.0–52.0)
Hemoglobin: 6.8 g/dL — CL (ref 13.0–17.0)
MCH: 17.9 pg — ABNORMAL LOW (ref 26.0–34.0)
MCHC: 29.2 g/dL — ABNORMAL LOW (ref 30.0–36.0)
MCV: 61.3 fL — ABNORMAL LOW (ref 80.0–100.0)
Platelets: 331 10*3/uL (ref 150–400)
RBC: 3.8 MIL/uL — ABNORMAL LOW (ref 4.22–5.81)
RDW: 20.1 % — ABNORMAL HIGH (ref 11.5–15.5)
WBC: 5.2 10*3/uL (ref 4.0–10.5)
nRBC: 0 % (ref 0.0–0.2)

## 2022-09-26 LAB — COMPREHENSIVE METABOLIC PANEL
ALT: 16 U/L (ref 0–44)
AST: 23 U/L (ref 15–41)
Albumin: 3.9 g/dL (ref 3.5–5.0)
Alkaline Phosphatase: 64 U/L (ref 38–126)
Anion gap: 9 (ref 5–15)
BUN: 18 mg/dL (ref 8–23)
CO2: 24 mmol/L (ref 22–32)
Calcium: 8.4 mg/dL — ABNORMAL LOW (ref 8.9–10.3)
Chloride: 102 mmol/L (ref 98–111)
Creatinine, Ser: 0.8 mg/dL (ref 0.61–1.24)
GFR, Estimated: >60 mL/min (ref >60)
Glucose, Bld: 101 mg/dL — ABNORMAL HIGH (ref 70–99)
Potassium: 3.8 mmol/L (ref 3.5–5.1)
Sodium: 135 mmol/L (ref 135–145)
Total Bilirubin: 0.5 mg/dL (ref 0.3–1.2)
Total Protein: 7.2 g/dL (ref 6.5–8.1)

## 2022-09-26 LAB — IRON AND TIBC
Iron: 12 ug/dL — ABNORMAL LOW (ref 45–182)
Saturation Ratios: 3 % — ABNORMAL LOW (ref 17.9–39.5)
TIBC: 396 ug/dL (ref 250–450)
UIBC: 384 ug/dL

## 2022-09-26 LAB — FERRITIN: Ferritin: 6 ng/mL — ABNORMAL LOW (ref 24–336)

## 2022-09-26 LAB — PREPARE RBC (CROSSMATCH)

## 2022-09-26 LAB — PROTIME-INR
INR: 1.3 — ABNORMAL HIGH (ref 0.8–1.2)
Prothrombin Time: 16.1 seconds — ABNORMAL HIGH (ref 11.4–15.2)

## 2022-09-26 LAB — IRON: Iron: 9 ug/dL — ABNORMAL LOW (ref 45–182)

## 2022-09-26 LAB — ABO/RH: ABO/RH(D): A POS

## 2022-09-26 LAB — POC OCCULT BLOOD, ED: Fecal Occult Bld: NEGATIVE

## 2022-09-26 MED ORDER — PANTOPRAZOLE SODIUM 40 MG IV SOLR
40.0000 mg | INTRAVENOUS | Status: DC
  Administered 2022-09-26 – 2022-09-29 (×4): 40 mg via INTRAVENOUS
  Filled 2022-09-26 (×4): qty 10

## 2022-09-26 MED ORDER — SODIUM CHLORIDE 0.9% IV SOLUTION: Freq: Once | INTRAVENOUS | Status: DC

## 2022-09-26 NOTE — Assessment & Plan Note (Addendum)
-   Patient maintained on home regimen Neurontin 600 mg twice daily.

## 2022-09-26 NOTE — Patient Instructions (Signed)
Thank you for trusting me with your care. To recap, today we discussed the following:   Stop these medications for know:  Asprin, Liquids, and Metoprolol  I am going to contact your GI doctor and follow up with you this afternoon.

## 2022-09-26 NOTE — Telephone Encounter (Signed)
Good afternoon Dr. Carlean Purl, we received a call from Dr. Lonzo Candy office requesting to speak with you in regards to this patient. States he has new iron deficiency anemia and would like to have this patient admitted. A good call back phone number would be.  [phone]    Thank you.

## 2022-09-26 NOTE — Progress Notes (Signed)
   HPI:[name] is a 77 y.o. 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    Patient presented to clinic on 3/11 for right leg pain and point-of-care ultrasound showed clot in his great saphenous vein.  He was sent for formal venous ultrasound and CT PE for symptom of new cough.  He did not have a DVT or PE.  His CBC was checked at this visit and patient was prescribed Eliquis.  CBC returned with microcytic anemia of 7.2 , compared to 1 year ago at [facility] he had hemoglobin 11.2.  Stat labs ordered before his office visit today.   Patient is feeling fatigue, but denies any lightheadedness or dizziness. His last colonoscopy and endoscopy were 3 years ago with  GI, Dr. Carlean Purl.  He continues to have periodic episodes of constipation and notices blood on his toilet paper.  No problems with this hemorrhoid in the last month.  No hematuria or hematemesis. His bowel movements have been dark brown, but no black or tarry stools.    Physical Exam: Vitals:   09/26/22 1448  BP: (!) 92/54  Pulse: 94  Resp: 17  SpO2: 98%  Weight: 192 lb (87.1 kg)  Height: 6' (1.829 m)     Physical Exam Constitutional:      General: He is not in acute distress.    Appearance: He is not toxic-appearing.  Eyes:     General: No scleral icterus.    Comments: Pale conjuctivae  Cardiovascular:     Rate and Rhythm: Normal rate and regular rhythm.     Heart sounds: No murmur heard. Abdominal:     General: Bowel sounds are normal.     Tenderness: There is no abdominal tenderness. There is no guarding.  Skin:    General: Skin is warm.      Coloration: Skin is pale. Skin is not jaundiced.      Assessment & Plan:   Russell [name] was seen today for cough.  Deficiency anemia  Iron deficiency anemia due to chronic blood loss Assessment & Plan: Patient has new iron deficiency anemia on stat lab work. I am recommending that he goes to emergency department as I cannot reliably predict if this is acute or subacute problem.  This could be from his hemorrhoids or another underlying cause of blood loss.  He would benefit from IV iron supplementation and possible blood transfusion.  Also endoscopy and colonoscopy. Called and spoke to patient's gastroenterologist with  GI, Dr. Carlean Purl.  He is not on service, but one of his partners could see patient if admitted. Patient given instruction to stop aspirin Eliquis, and metoprolol.  His wife will drive him to [facility].    Superficial vein thrombosis Assessment & Plan: Hold Eliquis, until further evaluation of possible GI bleed.  Iron deficiency possible cause of new superficial vein thrombus.       Lorene Dy, MD

## 2022-09-26 NOTE — Assessment & Plan Note (Addendum)
Chronic. 06/03/2018 Grade 3 left lateral and grade 2 right posterior right anterior Left lateral banded with 2 bands. 12/4 banded RP and LL (again with 2 bands) 08/01/2018 left lateral banded x2 -Colonoscopy done noted some internal hemorrhoids. -Follow

## 2022-09-26 NOTE — Assessment & Plan Note (Addendum)
-   Patient maintained on PPI during the hospitalization. 

## 2022-09-26 NOTE — Telephone Encounter (Signed)
Spoke to Dr. Court Joy - given situation it does seem like an inpatient evaluation and treatment is reasonable.  Lab Results  Component Value Date   FERRITIN 6 (L) 09/26/2022       Latest Ref Rng & Units 09/26/2022   12:03 PM 09/24/2022   11:18 AM 05/16/2021    7:47 AM  CBC  WBC 4.0 - 10.5 K/uL 5.2  4.8  5.0   Hemoglobin 13.0 - 17.0 g/dL 7.1  7.2  12.7   Hematocrit 39.0 - 52.0 % 24.0  24.6  40.5   Platelets 150 - 400 K/uL 316  340  201      I have suggested patient try [facility] ED since he wants to come to [facility]

## 2022-09-26 NOTE — ED Provider Notes (Signed)
Russell [name]   CSN: [id number] Arrival date & time: 09/26/22  1819     History  Chief Complaint  Patient presents with   Low Hgb   Clot in leg    Russell [name] is a 77 y.o. male.  HPI Patient is a 77 year old male with past medical history significant for stroke, sleep apnea, hemorrhoids, reflux, memory issues, constipation, cauda equina syndrome, anemia with baseline hemoglobin of 12 approximately, benign polyp  He is present emergency room today with complaints of 3 weeks of gradually worsening fatigue he states he feels occasionally lightheaded and weak and overall unwell.  No pain apart from some chronic pain he suffers from.  He states that he did have some lower extremity pain and had an ultrasound done that showed superficial thrombophlebitis and was started on Eliquis for this but had 3 doses of this before his labs resulted from his PCP that showed a new significant drop in his hemoglobin.  He was told to come to the emergency room.  He tells me he has had about 3 weeks or more of dark stool.  No history of GI bleeds in the past.  Last colonoscopy was 2 years ago with Dr. Arelia Longest of [facility].  As discussed above he has had 3 doses of Eliquis last dose was this morning.     Home Medications Prior to Admission medications   Medication Sig Start Date End Date Taking? Authorizing Provider  Acetaminophen 500 MG capsule     [provider]  APIXABAN (ELIQUIS) VTE STARTER PACK ('10MG'$  AND '5MG'$ ) Take as directed on package: start with two-'5mg'$  tablets twice daily for 7 days. On day 8, switch to one-'5mg'$  tablet twice daily. 09/24/22   Lyndal Pulley, MD  ASHWAGANDHA PO Take 450 mg by mouth in the morning and at bedtime.    [provider]  aspirin 81 MG EC tablet Take 81 mg by mouth every evening. 05/18/20   [provider]  cyclobenzaprine (FLEXERIL) 10 MG tablet Take 1 tablet (10 mg  total) by mouth at bedtime. 06/27/22   [name], MD  Docusate Sodium (DSS) 100 MG CAPS     [provider]  finasteride (PROSCAR) 5 MG tablet Take 1 tablet (5 mg total) by mouth daily. 01/22/22   McKenzie, Candee Furbish, MD  gabapentin (NEURONTIN) 300 MG capsule Take 1 capsule (300 mg total) by mouth 3 (three) times daily. (Actually 300 mg in AM and 600 mg QHS) x 1 week, then 600 mg BID- for nerve pain 08/31/22   Lovorn, Jinny Blossom, MD  hydrocortisone (ANUSOL-HC) 2.5 % rectal cream Place 1 application. rectally 2 (two) times daily. Patient taking differently: Place 1 application  rectally 3 times/day as needed-between meals & bedtime. 11/06/21   Levin Erp, PA  Magnesium 400 MG CAPS Take 400 mg by mouth 2 (two) times daily. asporotate    [provider]  Melatonin 5 MG/ML LIQD Take 5 mg by mouth daily.    [provider]  metoprolol succinate (TOPROL XL) 25 MG 24 hr tablet Take 1 tablet (25 mg total) by mouth daily. 08/07/22   Troy Sine, MD  metroNIDAZOLE (METROGEL) 0.75 % gel Apply to face daily for rosacea 07/12/21   Lavonna Monarch, MD  Omega-3 Fatty Acids (FISH OIL) 1200 MG CAPS Take 1,200 mg by mouth daily.    [provider]  omeprazole (PRILOSEC) 40 MG capsule TAKE (1) CAPSULE BY  MOUTH ONCE DAILY. 03/28/22   Gatha Mayer, MD  polyethylene glycol powder (GLYCOLAX/MIRALAX) 17 GM/SCOOP powder Take 0.5 Containers by mouth daily as needed for moderate constipation or mild constipation.    [provider]  Probiotic Product (PROBIOTIC DAILY PO) Take 1 tablet by mouth 2 (two) times daily.    [provider]  senna-docusate (SENOKOT-S) 8.6-50 MG tablet Take 2 tablets by mouth at bedtime as needed for mild constipation.    [provider]  silodosin (RAPAFLO) 8 MG CAPS capsule Take 1 capsule (8 mg total) by mouth 2 (two) times daily. 02/26/22   McKenzie, Candee Furbish, MD  TURMERIC PO Take 750 mg by mouth daily. curamed curcumin     [provider]      Allergies    Patient has no known allergies.    Review of Systems   Review of Systems  Physical Exam Updated Vital Signs BP 102/72   Pulse 64   Temp 98.9 F (37.2 C) (Oral)   Resp 13   SpO2 97%  Physical Exam Vitals and nursing [name] reviewed.  Constitutional:      General: He is not in acute distress. HENT:     Head: Normocephalic and atraumatic.     Nose: Nose normal.  Eyes:     General: No scleral icterus. Cardiovascular:     Rate and Rhythm: Normal rate and regular rhythm.     Pulses: Normal pulses.     Heart sounds: Normal heart sounds.  Pulmonary:     Effort: Pulmonary effort is normal. No respiratory distress.     Breath sounds: No wheezing.  Abdominal:     Palpations: Abdomen is soft.     Tenderness: There is no abdominal tenderness.  Genitourinary:    Comments: Empty rectal vault.  Sloan H chaperone  Musculoskeletal:     Cervical back: Normal range of motion.     Right lower leg: No edema.     Left lower leg: No edema.  Skin:    General: Skin is warm and dry.     Capillary Refill: Capillary refill takes less than 2 seconds.  Neurological:     Mental Status: He is alert. Mental status is at baseline.  Psychiatric:        Mood and Affect: Mood normal.        Behavior: Behavior normal.     ED Results / Procedures / Treatments   Labs (all labs ordered are listed, but only abnormal results are displayed) Labs Reviewed  COMPREHENSIVE METABOLIC PANEL - Abnormal; Notable for the following components:      Result Value   Glucose, Bld 101 (*)    Calcium 8.4 (*)    All other components within normal limits  CBC - Abnormal; Notable for the following components:   RBC 3.80 (*)    Hemoglobin 6.8 (*)    HCT 23.3 (*)    MCV 61.3 (*)    MCH 17.9 (*)    MCHC 29.2 (*)    RDW 20.1 (*)    All other components within normal limits  PROTIME-INR - Abnormal; Notable for the following components:   Prothrombin Time 16.1 (*)    INR  1.3 (*)    All other components within normal limits  POC OCCULT BLOOD, ED  TYPE AND SCREEN  ABO/RH  PREPARE RBC (CROSSMATCH)    EKG None  Radiology No results found.  Procedures .Critical Care  Performed by: Tedd Sias, PA Authorized by: Lavone Orn,  Kathleene Hazel, PA   Critical care provider statement:    Critical care time (minutes):  35   Critical care time was exclusive of:  Separately billable procedures and treating other patients and teaching time   Critical care was necessary to treat or prevent imminent or life-threatening deterioration of the following conditions: Severe anemia requiring transfusion.   Critical care was time spent personally by me on the following activities:  Development of treatment plan with patient or surrogate, review of old charts, re-evaluation of patient's condition, pulse oximetry, ordering and review of radiographic studies, ordering and review of laboratory studies, ordering and performing treatments and interventions, obtaining history from patient or surrogate, examination of patient and evaluation of patient's response to treatment   Care discussed with: admitting provider       Medications Ordered in ED Medications  pantoprazole (PROTONIX) injection 40 mg (40 mg Intravenous Given 09/26/22 2125)  0.9 %  sodium chloride infusion (Manually program via Guardrails IV Fluids) (has no administration in time range)    ED Course/ Medical Decision Making/ A&P                             Medical Decision Making Amount and/or Complexity of Data Reviewed Labs: ordered.  Risk Prescription drug management. Decision regarding hospitalization.   This patient presents to the ED for concern of fatigue, this involves a number of treatment options, and is a complaint that carries with it a high risk of complications and morbidity. A differential diagnosis was considered for the patient's symptoms which is discussed below:   The differential diagnosis  of weakness includes but is not limited to neurologic causes (GBS, myasthenia gravis, CVA, MS, ALS, transverse myelitis, spinal cord injury, CVA, botulism, ) and other causes: ACS, Arrhythmia, syncope, orthostatic hypotension, sepsis, hypoglycemia, electrolyte disturbance, hypothyroidism, respiratory failure, symptomatic anemia, dehydration, heat injury, polypharmacy, malignancy.    Co morbidities: Discussed in HPI   Brief History:  Patient is a 77 year old male with past medical history significant for stroke, sleep apnea, hemorrhoids, reflux, memory issues, constipation, cauda equina syndrome, anemia with baseline hemoglobin of 12 approximately, benign polyp  He is present emergency room today with complaints of 3 weeks of gradually worsening fatigue he states he feels occasionally lightheaded and weak and overall unwell.  No pain apart from some chronic pain he suffers from.  He states that he did have some lower extremity pain and had an ultrasound done that showed superficial thrombophlebitis and was started on Eliquis for this but had 3 doses of this before his labs resulted from his PCP that showed a new significant drop in his hemoglobin.  He was told to come to the emergency room.  He tells me he has had about 3 weeks or more of dark stool.  No history of GI bleeds in the past.  Last colonoscopy was 2 years ago with Dr. Arelia Longest of [facility] gastro[facility].  As discussed above he has had 3 doses of Eliquis last dose was this morning.    EMR reviewed including pt PMHx, past surgical history and past visits to ER.   See HPI for more details   Lab Tests:   I ordered and independently interpreted labs. Labs notable for Hemoglobin of 6.8 seems baseline is around 12, MCV and MCH is low consistent with blood loss anemia perhaps iron deficiency anemia I favor the former given the timeline.  CMP unremarkable BUN and notably normal.  INR  is slightly abnormal with a value of 1.3 perhaps due  to recent Eliquis.  Fecal occult negative in the setting of empty rectal vault.  Type and screen obtained.  Imaging Studies:  No imaging studies ordered for this patient    Cardiac Monitoring:  The patient was maintained on a cardiac monitor.  I personally viewed and interpreted the cardiac monitored which showed an underlying rhythm of: NSR NA   Medicines ordered:  I ordered medication including Protonix and 2 units of PRBCs for symptomatic anemia from likely upper GI bleed Reevaluation of the patient after these medicines showed that the patient improved I have reviewed the patients home medicines and have made adjustments as needed   Critical Interventions:     Consults/Attending Physician   I requested consultation with Dr [name],  and discussed lab and imaging findings as well as pertinent plan - they recommend: admission. Agree w plan.   I reached out via secure chat to Dr. Silverio Decamp  Reevaluation:  After the interventions noted above I re-evaluated patient and found that they have :improved   Social Determinants of Health:      Problem List / ED Course:  Likely upper GI bleed given dark stools for 3 weeks and new anemia.  I reached out via secure chat to Dr. Silverio Decamp -per hospital policy.    Dispostion:  After consideration of the diagnostic results and the patients response to treatment, I feel that the patent would benefit from admission.     Final Clinical Impression(s) / ED Diagnoses Final diagnoses:  Symptomatic anemia  Melena    Rx / DC Orders ED Discharge Orders     None         Tedd Sias, [place] 09/26/22 [place], [place], DO 09/26/22 2345

## 2022-09-26 NOTE — Assessment & Plan Note (Addendum)
-   Patient maintained on home regimen Neurontin. 

## 2022-09-26 NOTE — ED Triage Notes (Signed)
Pt arrived via POV. Pt had Hgb of 7.1, and blood clot in R leg. Pt sched for endoscopy and colonoscopy tomorrow, d/t concern for internal bleeding.  Pt has hx anemia.  AOx4

## 2022-09-26 NOTE — Assessment & Plan Note (Addendum)
Patient has new severe iron deficiency anemia on stat lab work. Hgb 7.1 on borderline of requiring transfusion. I recommended that he goes to emergency department as I cannot reliably predict if this is acute or subacute problem.  This could be from his hemorrhoids or another underlying cause of blood loss.  He would benefit from IV iron supplementation and possible blood transfusion.  Also endoscopy and colonoscopy. Called and spoke to patient's gastroenterologist with [facility], Dr. Carlean Purl.  He is not on service, but one of his partners could see patient if admitted. Patient given instruction to stop aspirin Eliquis, and hold metoprolol. His wife will drive him to [facility].

## 2022-09-26 NOTE — Assessment & Plan Note (Addendum)
-  Eliquis was held during the hospitalization and will not be resumed on discharge.  -No DVT or PE noted on lower extremity Dopplers on CT angiogram chest.   -Eliquis continued to be held during the hospitalization will be discontinued on discharge.  It was felt per general surgery that patient superficial vein thrombosis did not require anticoagulation at this time however will defer to PCP on follow-up for further management.

## 2022-09-26 NOTE — H&P (Signed)
History and Physical    Russell [name] [id number] DOB: 27-Aug-1945 DOA: 09/26/2022  DOS: the patient was seen and examined on 09/26/2022  PCP: [name], MD   Patient coming from: Home  I have personally briefly reviewed patient's old medical records in [place]  CC: anemia HPI: 77 year old male history of stage Ia non-small cell lung cancer status post left lower lobe lobectomy in 2023, history of cauda equina syndrome status post laminectomy October 2021, history of internal hemorrhoids, history of degenerative disc disease who presents to the ER today from the office due to anemia.  Patient was seen in on September 24, 2022 due to a sore spot on his right thigh.  He had a point-of-care ultrasound which diagnosed him with a superficial vein thrombosis.  He was started on Eliquis.  He was referred to the hospital for an ultrasound which she did have.  This was negative for a deep vein thrombosis.  CTPA was negative for PE.  The ultrasound did confirm that he had superficial thrombophlebitis of the mid greater saphenous vein.  He still had labs drawn at that time and his heme open came back today at 7.2 g/dL.  In February 2023, his hemoglobin then was 11.2.  Patient denies any gross melena.  He has had in the last 3 weeks no bloody bowel movements.  He denies any black or tarry sticky stools.  Patient states that he has been feeling more fatigued and tired but attributes this to his lumbar radiculopathy.  He is also under a lot of stress as his wife has terminal cancer.  Patient was seen again by his PCP and directed to the ER due to his hemoglobin.  EDP is contacted [facility] for consultation.  [facility] hospitalist contacted for admission.   ED Course: Hgb 6.8  Review of Systems:  Review of Systems  Constitutional:  Positive for malaise/fatigue.  HENT: Negative.    Eyes: Negative.   Respiratory: Negative.    Cardiovascular: Negative.   Gastrointestinal: Negative.    Genitourinary: Negative.   Musculoskeletal:  Positive for back pain.  Skin: Negative.   Neurological:  Positive for tingling.  Endo/Heme/Allergies: Negative.   Psychiatric/Behavioral: Negative.    All other systems reviewed and are negative.   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   anterior cer disc and fusion with plates C3-4 and [id number] at [facility]   Constipation    Encounter for general adult medical examination with abnormal findings 06/27/2022   Enlarged prostate    GERD (gastroesophageal reflux disease)    Hemorrhoids    History of kidney stones    passed stones   History of lumbar laminectomy 05/12/2020   open L2-L5 at [facility]   Hx of colonic polyp 05/29/2018   05/2018 cecal ssp   Lung nodule 02/2021   Maxillary sinus polyp    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   Prolapsed internal hemorrhoids, grade 3 09/10/2006   Qualifier: Diagnosis of  By: Jonna Munro MD, Cornelius     Salivary gland enlargement    Sleep apnea 10 years ago   declined CPAP   Stroke ([place]) 15-20 years ago  Past Surgical History:  Procedure Laterality Date   BRONCHIAL BIOPSY  05/16/2021   Procedure: BRONCHIAL BIOPSIES;  Surgeon: Garner Nash, DO;  Location: [facility] ENDOSCOPY;  Service: Pulmonary;;   BRONCHIAL BRUSHINGS  05/16/2021   Procedure: BRONCHIAL BRUSHINGS;  Surgeon: Garner Nash, DO;  Location: [place] ENDOSCOPY;  Service: Pulmonary;;   BRONCHIAL NEEDLE ASPIRATION BIOPSY  05/16/2021   Procedure: BRONCHIAL NEEDLE ASPIRATION BIOPSIES;  Surgeon: Garner Nash, DO;  Location: [place] ENDOSCOPY;  Service: Pulmonary;;   CERVICAL SPINE SURGERY  04/10/2020   C3-C4 with pin at [facility]   COLONOSCOPY     FIDUCIAL MARKER PLACEMENT  05/16/2021    Procedure: FIDUCIAL MARKER PLACEMENT;  Surgeon: Garner Nash, DO;  Location: [place] ENDOSCOPY;  Service: Pulmonary;;   HEMORRHOID BANDING     LUMBAR LAMINECTOMY  03/08/2010   L2-3, cauda equina syndrome   lumbar laminectomy  05/12/2020   [facility]   SPINE SURGERY     [facility] ENDOSCOPY     VIDEO BRONCHOSCOPY WITH ENDOBRONCHIAL NAVIGATION Left 05/16/2021   Procedure: VIDEO BRONCHOSCOPY WITH ENDOBRONCHIAL NAVIGATION;  Surgeon: Garner Nash, DO;  Location: [place];  Service: Pulmonary;  Laterality: Left;  ION w/ fiducial placement   VIDEO BRONCHOSCOPY WITH RADIAL ENDOBRONCHIAL ULTRASOUND  05/16/2021   Procedure: VIDEO BRONCHOSCOPY WITH RADIAL ENDOBRONCHIAL ULTRASOUND;  Surgeon: Garner Nash, DO;  Location: [place] ENDOSCOPY;  Service: Pulmonary;;   wisdom teeth ext       reports that he quit smoking about 47 years ago. His smoking use included cigarettes. He started smoking about 56 years ago. He has a 2.25 pack-year smoking history. He has been exposed to tobacco smoke. He has never used smokeless tobacco. He reports that he does not currently use alcohol. He reports that he does not use drugs.  No Known Allergies  Family History  Problem Relation Age of Onset   Aortic aneurysm Mother    Varicose Veins Mother    Heart attack Father    Arthritis Father    Aortic aneurysm Sister    Early death Sister    CVA Sister    Aortic aneurysm Sister    Aortic aneurysm Sister    Colon cancer Neg Hx    Pancreatic cancer Neg Hx    Rectal cancer Neg Hx    Stomach cancer Neg Hx    Esophageal cancer Neg Hx    Colon polyps Neg Hx     Prior to Admission medications   Medication Sig Start Date End Date Taking? Authorizing Provider  APIXABAN (ELIQUIS) VTE STARTER PACK ('10MG'$  AND '5MG'$ ) Take as directed on package: start with two-'5mg'$  tablets twice daily for 7 days. On day 8, switch to one-'5mg'$  tablet twice daily. 09/24/22  Yes Lyndal Pulley, MD  Magnesium 400 MG CAPS Take 400 mg by mouth 2 (two) times  daily. asporotate   Yes [provider]  Omega-3 Fatty Acids (FISH OIL) 1200 MG CAPS Take 1,200 mg by mouth daily.   Yes [provider]  Acetaminophen 500 MG capsule     [provider]  ASHWAGANDHA PO Take 450 mg by mouth in the morning and at bedtime.    [provider]  aspirin 81 MG EC tablet Take 81 mg by mouth every evening. 05/18/20   [provider]  cyclobenzaprine (FLEXERIL) 10 MG tablet Take 1 tablet (10 mg total) by mouth at bedtime. 06/27/22   [name], MD  Docusate Sodium (DSS) 100 MG CAPS  [provider]  finasteride (PROSCAR) 5 MG tablet Take 1 tablet (5 mg total) by mouth daily. 01/22/22   McKenzie, Candee Furbish, MD  gabapentin (NEURONTIN) 300 MG capsule Take 1 capsule (300 mg total) by mouth 3 (three) times daily. (Actually 300 mg in AM and 600 mg QHS) x 1 week, then 600 mg BID- for nerve pain 08/31/22   Lovorn, Jinny Blossom, MD  hydrocortisone (ANUSOL-HC) 2.5 % rectal cream Place 1 application. rectally 2 (two) times daily. Patient taking differently: Place 1 application  rectally 3 times/day as needed-between meals & bedtime. 11/06/21   Levin Erp, PA  Melatonin 5 MG/ML LIQD Take 5 mg by mouth daily.    [provider]  metoprolol succinate (TOPROL XL) 25 MG 24 hr tablet Take 1 tablet (25 mg total) by mouth daily. 08/07/22   Troy Sine, MD  metroNIDAZOLE (METROGEL) 0.75 % gel Apply to face daily for rosacea 07/12/21   Lavonna Monarch, MD  omeprazole (PRILOSEC) 40 MG capsule TAKE (1) CAPSULE BY MOUTH ONCE DAILY. 03/28/22   Gatha Mayer, MD  polyethylene glycol powder (GLYCOLAX/MIRALAX) 17 GM/SCOOP powder Take 0.5 Containers by mouth daily as needed for moderate constipation or mild constipation.    [provider]  Probiotic Product (PROBIOTIC DAILY PO) Take 1 tablet by mouth 2 (two) times daily.    [provider]  senna-docusate (SENOKOT-S) 8.6-50 MG tablet Take 2 tablets by mouth at  bedtime as needed for mild constipation.    [provider]  silodosin (RAPAFLO) 8 MG CAPS capsule Take 1 capsule (8 mg total) by mouth 2 (two) times daily. 02/26/22   McKenzie, Candee Furbish, MD  TURMERIC PO Take 750 mg by mouth daily. curamed curcumin    [provider]    Physical Exam: Vitals:   09/26/22 2130 09/26/22 2200 09/26/22 2230 09/26/22 2347  BP: 114/72 104/68 103/60 118/66  Pulse: 65 (!) 40 (!) 52 63  Resp: '14 12 12 13  '$ Temp:   97.9 F (36.6 C) 98.6 F (37 C)  TempSrc:   Oral Oral  SpO2: 99% 98% 98% 98%    Physical Exam Vitals and nursing note reviewed.  Constitutional:      General: He is not in acute distress.    Appearance: Normal appearance. He is normal weight. He is not toxic-appearing or diaphoretic.  HENT:     Head: Normocephalic and atraumatic.     Nose: Nose normal.  Eyes:     General: No scleral icterus. Cardiovascular:     Rate and Rhythm: Normal rate. Rhythm irregular.     Pulses: Normal pulses.  Pulmonary:     Effort: Pulmonary effort is normal.     Breath sounds: Normal breath sounds.  Abdominal:     General: Abdomen is flat. Bowel sounds are normal. There is no distension.     Palpations: Abdomen is soft.     Tenderness: There is no abdominal tenderness.  Musculoskeletal:     Right lower leg: No edema.     Left lower leg: No edema.  Skin:    General: Skin is warm and dry.     Capillary Refill: Capillary refill takes less than 2 seconds.  Neurological:     General: No focal deficit present.     Mental Status: He is alert and oriented to person, place, and time.      Labs on Admission: I have personally reviewed following labs and imaging studies  CBC: Recent Labs  Lab 09/24/22  1118 09/26/22 1203 09/26/22 1853  WBC 4.8 5.2 5.2  NEUTROABS 3.3 2.8  --   HGB 7.2* 7.1* 6.8*  HCT 24.6* 24.0* 23.3*  MCV 62* 61.4* 61.3*  PLT 340 316 [id number]   Basic Metabolic Panel: Recent Labs  Lab 09/24/22 1118 09/26/22 1853  NA 136  135  K 4.0 3.8  CL 100 102  CO2 22 24  GLUCOSE 118* 101*  BUN 13 18  CREATININE 0.72* 0.80  CALCIUM 9.0 8.4*   GFR: Estimated Creatinine Clearance: 88.9 mL/min (by C-G formula based on SCr of 0.8 mg/dL). Liver Function Tests: Recent Labs  Lab 09/26/22 1853  AST 23  ALT 16  ALKPHOS 64  BILITOT 0.5  PROT 7.2  ALBUMIN 3.9   Coagulation Profile: Recent Labs  Lab 09/26/22 1853  INR 1.3*   Anemia Panel: Recent Labs    09/26/22 1203  FERRITIN 6*  TIBC 396  IRON 9*  12*  RETICCTPCT 1.0    Radiological Exams on Admission: I have personally reviewed images No results found.  EKG: My personal interpretation of EKG shows: no EKG to review  Assessment/Plan Principal Problem:   Acute blood loss anemia Active Problems:   Prolapsed internal hemorrhoids, grade 2 and 3   GERD   Left lumbar radiculopathy   Neuropathic pain   Superficial vein thrombosis   History of cancer of lower lobe bronchus or lung - s/p left lower lobectomy at [facility] 09-07-2021    Assessment and Plan: * Acute blood loss anemia Observation med/surg bed. Continue with PRBC transfusion. GI notified of need for consult. Keep NPO. Pt's hgB of 7.1 was obtained prior to staring Eliquis for superficial thrombophlebitis. Last CBC was from 08-2021. Had colonoscopy in November 2019 that removed 1 sessile polyp.  History of cancer of lower lobe bronchus or lung - s/p left lower lobectomy at [facility] 09-07-2021 Stable. Pt states he did not receive any chemo or radiation after lobectomy. Follows with [facility]. Per their notes, pt has stage 1A3   Superficial vein thrombosis Hold eliquis for now. No DVT or PE on LE U/S and CTPA  Neuropathic pain Continue with neurontin 600 mg bid.  Left lumbar radiculopathy Continue with neurontin 600 mg bid.  GERD Continue with protonix.  Prolapsed internal hemorrhoids, grade 2 and 3 Chronic.   DVT prophylaxis: SCDs Code Status: Full Code Family Communication: no  family at bedside  Disposition Plan: return home  Consults called: EDP has consulted [facility](nandigam)  Admission status: Observation, Med-Surg   Kristopher Oppenheim, DO [facility] 09/26/2022, 11:56 PM

## 2022-09-26 NOTE — Assessment & Plan Note (Addendum)
Stable. Pt states he did not receive any chemo or radiation after lobectomy. Follows with [facility]. Per their notes, pt has stage 1A3  -Outpatient follow-up.

## 2022-09-26 NOTE — Subjective & Objective (Signed)
CC: anemia HPI: 77 year old male history of stage Ia non-small cell lung cancer status post left lower lobe lobectomy in 2023, history of cauda equina syndrome status post laminectomy October 2021, history of internal hemorrhoids, history of degenerative disc disease who presents to the ER today from the office due to anemia.  Patient was seen in on September 24, 2022 due to a sore spot on his right thigh.  He had a point-of-care ultrasound which diagnosed him with a superficial vein thrombosis.  He was started on Eliquis.  He was referred to the hospital for an ultrasound which she did have.  This was negative for a deep vein thrombosis.  CTPA was negative for PE.  The ultrasound did confirm that he had superficial thrombophlebitis of the mid greater saphenous vein.  He still had labs drawn at that time and his heme open came back today at 7.2 g/dL.  In February 2023, his hemoglobin then was 11.2.  Patient denies any gross melena.  He has had in the last 3 weeks no bloody bowel movements.  He denies any black or tarry sticky stools.  Patient states that he has been feeling more fatigued and tired but attributes this to his lumbar radiculopathy.  He is also under a lot of stress as his wife has terminal cancer.  Patient was seen again by his PCP and directed to the ER due to his hemoglobin.  EDP is contacted [facility] for consultation.  [facility] hospitalist contacted for admission.

## 2022-09-26 NOTE — Assessment & Plan Note (Signed)
Hold Eliquis, until further evaluation of possible GI bleed.  Iron deficiency possible cause of new superficial vein thrombus.

## 2022-09-26 NOTE — Assessment & Plan Note (Addendum)
Patient presented from PCPs office due to anemia.   -Patient noted to have had labs drawn which was 7.2 on 09/24/2022.   -Patient noted to have last hemoglobin of 12.7 on 05/16/2021 as noted on epic.  Patient noted to have had a hemoglobin of 7.1 prior to starting Eliquis.   -Hemoglobin repeat on presentation to the ED noted at 6.8.  -Anemia panel done consistent with a severe iron deficiency anemia with iron level of 9, ferritin of 6, TIBC of 396.   -Status post transfusion 2 units packed red blood cells with repeat hemoglobin stable at 7.9 today.  -FOBT negative.   -Had colonoscopy in November 2019 that removed 1 sessile polyp. -Patient noted to have intermittent dark stools and states stool was brown this morning. -Patient seen in consultation by GI and patient underwent EGD/colonoscopy 09/28/2022.   -EGD unremarkable.   -Colonoscopy concerning for ascending nonobstructing colonic mass in the proximal ascending colon without any visible active bleeding, pathology pending. -CT staging scans pending. -CEA pending. -Patient receiving IV iron. -Continue PPI. -Follow H&H. -Transfusion threshold hemoglobin < 7. -GI following. -General surgery consulted.Marland Kitchen

## 2022-09-26 NOTE — ED Provider Triage Note (Signed)
Emergency Medicine Provider Triage Evaluation Note  DELON LEN , a 77 y.o. male  was evaluated in triage.  Pt complains of low hemoglobin. Patient notes he has a history of internal and external hemorrhoids, notes melena.  Also has associated dry cough.  Denies palpitations.  Patient was diagnosed with a DVT on 09/24/2022 and started on anticoagulants.  That has been stopped at this point due to the findings of low hemoglobin. Denies chest pain, shortness of breath, him hemoptysis, vomiting of blood, blood in stool.  Review of Systems  Positive:  Negative:   Physical Exam  BP 132/86 (BP Location: Right Arm)   Pulse 71   Temp 98.9 F (37.2 C) (Oral)   Resp 16   SpO2 100%  Gen:   Awake, no distress   Resp:  Normal effort  MSK:   Moves extremities without difficulty Other:    Medical Decision Making  Medically screening exam initiated at 7:04 PM.  Appropriate orders placed.  [facility] was informed that the remainder of the evaluation will be completed by another provider, this initial triage assessment does not replace that evaluation, and the importance of remaining in the ED until their evaluation is complete.  7:04 PM - Discussed with RN that patient is in need of a room immediately. RN aware and working on room placement.    Keelia Graybill A, PA-C 09/26/22 1912

## 2022-09-27 ENCOUNTER — Encounter (HOSPITAL_COMMUNITY): Payer: Self-pay | Admitting: Internal Medicine

## 2022-09-27 DIAGNOSIS — K2100 Gastro-esophageal reflux disease with esophagitis, without bleeding: Secondary | ICD-10-CM | POA: Diagnosis present

## 2022-09-27 DIAGNOSIS — G4730 Sleep apnea, unspecified: Secondary | ICD-10-CM | POA: Diagnosis not present

## 2022-09-27 DIAGNOSIS — G834 Cauda equina syndrome: Secondary | ICD-10-CM | POA: Diagnosis present

## 2022-09-27 DIAGNOSIS — D62 Acute posthemorrhagic anemia: Secondary | ICD-10-CM | POA: Diagnosis present

## 2022-09-27 DIAGNOSIS — K629 Disease of anus and rectum, unspecified: Secondary | ICD-10-CM | POA: Diagnosis not present

## 2022-09-27 DIAGNOSIS — Z86010 Personal history of colonic polyps: Secondary | ICD-10-CM | POA: Diagnosis not present

## 2022-09-27 DIAGNOSIS — K5909 Other constipation: Secondary | ICD-10-CM | POA: Diagnosis present

## 2022-09-27 DIAGNOSIS — K921 Melena: Secondary | ICD-10-CM | POA: Diagnosis present

## 2022-09-27 DIAGNOSIS — K648 Other hemorrhoids: Secondary | ICD-10-CM | POA: Diagnosis present

## 2022-09-27 DIAGNOSIS — K219 Gastro-esophageal reflux disease without esophagitis: Secondary | ICD-10-CM | POA: Diagnosis present

## 2022-09-27 DIAGNOSIS — D509 Iron deficiency anemia, unspecified: Secondary | ICD-10-CM | POA: Diagnosis present

## 2022-09-27 DIAGNOSIS — I82819 Embolism and thrombosis of superficial veins of unspecified lower extremities: Secondary | ICD-10-CM

## 2022-09-27 DIAGNOSIS — Z8711 Personal history of peptic ulcer disease: Secondary | ICD-10-CM | POA: Diagnosis not present

## 2022-09-27 DIAGNOSIS — Z79899 Other long term (current) drug therapy: Secondary | ICD-10-CM | POA: Diagnosis not present

## 2022-09-27 DIAGNOSIS — Z87442 Personal history of urinary calculi: Secondary | ICD-10-CM | POA: Diagnosis not present

## 2022-09-27 DIAGNOSIS — D649 Anemia, unspecified: Secondary | ICD-10-CM | POA: Diagnosis not present

## 2022-09-27 DIAGNOSIS — C182 Malignant neoplasm of ascending colon: Secondary | ICD-10-CM | POA: Diagnosis present

## 2022-09-27 DIAGNOSIS — G8929 Other chronic pain: Secondary | ICD-10-CM | POA: Diagnosis present

## 2022-09-27 DIAGNOSIS — D490 Neoplasm of unspecified behavior of digestive system: Secondary | ICD-10-CM | POA: Diagnosis not present

## 2022-09-27 DIAGNOSIS — Z7901 Long term (current) use of anticoagulants: Secondary | ICD-10-CM | POA: Diagnosis not present

## 2022-09-27 DIAGNOSIS — Z7982 Long term (current) use of aspirin: Secondary | ICD-10-CM | POA: Diagnosis not present

## 2022-09-27 DIAGNOSIS — C189 Malignant neoplasm of colon, unspecified: Secondary | ICD-10-CM | POA: Diagnosis not present

## 2022-09-27 DIAGNOSIS — I7300 Raynaud's syndrome without gangrene: Secondary | ICD-10-CM | POA: Diagnosis present

## 2022-09-27 DIAGNOSIS — M792 Neuralgia and neuritis, unspecified: Secondary | ICD-10-CM | POA: Diagnosis not present

## 2022-09-27 DIAGNOSIS — I8001 Phlebitis and thrombophlebitis of superficial vessels of right lower extremity: Secondary | ICD-10-CM | POA: Diagnosis present

## 2022-09-27 DIAGNOSIS — Z87891 Personal history of nicotine dependence: Secondary | ICD-10-CM | POA: Diagnosis not present

## 2022-09-27 DIAGNOSIS — N400 Enlarged prostate without lower urinary tract symptoms: Secondary | ICD-10-CM | POA: Diagnosis present

## 2022-09-27 DIAGNOSIS — D500 Iron deficiency anemia secondary to blood loss (chronic): Secondary | ICD-10-CM | POA: Diagnosis not present

## 2022-09-27 DIAGNOSIS — K642 Third degree hemorrhoids: Secondary | ICD-10-CM | POA: Diagnosis not present

## 2022-09-27 DIAGNOSIS — Z8661 Personal history of infections of the central nervous system: Secondary | ICD-10-CM | POA: Diagnosis not present

## 2022-09-27 DIAGNOSIS — M5416 Radiculopathy, lumbar region: Secondary | ICD-10-CM | POA: Diagnosis present

## 2022-09-27 DIAGNOSIS — K641 Second degree hemorrhoids: Secondary | ICD-10-CM

## 2022-09-27 DIAGNOSIS — K6389 Other specified diseases of intestine: Secondary | ICD-10-CM | POA: Diagnosis not present

## 2022-09-27 DIAGNOSIS — R195 Other fecal abnormalities: Secondary | ICD-10-CM

## 2022-09-27 DIAGNOSIS — Z8673 Personal history of transient ischemic attack (TIA), and cerebral infarction without residual deficits: Secondary | ICD-10-CM | POA: Diagnosis not present

## 2022-09-27 DIAGNOSIS — G4733 Obstructive sleep apnea (adult) (pediatric): Secondary | ICD-10-CM | POA: Diagnosis present

## 2022-09-27 DIAGNOSIS — Z85118 Personal history of other malignant neoplasm of bronchus and lung: Secondary | ICD-10-CM | POA: Diagnosis not present

## 2022-09-27 LAB — CBC WITH DIFFERENTIAL/PLATELET
Abs Immature Granulocytes: 0.01 10*3/uL (ref 0.00–0.07)
Basophils Absolute: 0 10*3/uL (ref 0.0–0.1)
Basophils Relative: 1 %
Eosinophils Absolute: 0.1 10*3/uL (ref 0.0–0.5)
Eosinophils Relative: 3 %
HCT: 25.1 % — ABNORMAL LOW (ref 39.0–52.0)
Hemoglobin: 7.5 g/dL — ABNORMAL LOW (ref 13.0–17.0)
Immature Granulocytes: 0 %
Lymphocytes Relative: 38 %
Lymphs Abs: 1.9 10*3/uL (ref 0.7–4.0)
MCH: 19.2 pg — ABNORMAL LOW (ref 26.0–34.0)
MCHC: 29.9 g/dL — ABNORMAL LOW (ref 30.0–36.0)
MCV: 64.4 fL — ABNORMAL LOW (ref 80.0–100.0)
Monocytes Absolute: 0.5 10*3/uL (ref 0.1–1.0)
Monocytes Relative: 11 %
Neutro Abs: 2.4 10*3/uL (ref 1.7–7.7)
Neutrophils Relative %: 47 %
Platelets: 283 10*3/uL (ref 150–400)
RBC: 3.9 MIL/uL — ABNORMAL LOW (ref 4.22–5.81)
RDW: 23.1 % — ABNORMAL HIGH (ref 11.5–15.5)
WBC: 5 10*3/uL (ref 4.0–10.5)
nRBC: 0.4 % — ABNORMAL HIGH (ref 0.0–0.2)

## 2022-09-27 LAB — COMPREHENSIVE METABOLIC PANEL
ALT: 14 U/L (ref 0–44)
AST: 18 U/L (ref 15–41)
Albumin: 3.7 g/dL (ref 3.5–5.0)
Alkaline Phosphatase: 59 U/L (ref 38–126)
Anion gap: 9 (ref 5–15)
BUN: 18 mg/dL (ref 8–23)
CO2: 24 mmol/L (ref 22–32)
Calcium: 8.2 mg/dL — ABNORMAL LOW (ref 8.9–10.3)
Chloride: 104 mmol/L (ref 98–111)
Creatinine, Ser: 0.73 mg/dL (ref 0.61–1.24)
GFR, Estimated: >60 mL/min (ref >60)
Glucose, Bld: 118 mg/dL — ABNORMAL HIGH (ref 70–99)
Potassium: 4.2 mmol/L (ref 3.5–5.1)
Sodium: 137 mmol/L (ref 135–145)
Total Bilirubin: 1.4 mg/dL — ABNORMAL HIGH (ref 0.3–1.2)
Total Protein: 6.3 g/dL — ABNORMAL LOW (ref 6.5–8.1)

## 2022-09-27 LAB — HEMOGLOBIN AND HEMATOCRIT, BLOOD
HCT: 27.7 % — ABNORMAL LOW (ref 39.0–52.0)
Hemoglobin: 8.3 g/dL — ABNORMAL LOW (ref 13.0–17.0)

## 2022-09-27 MED ORDER — ONDANSETRON HCL 4 MG/2ML IJ SOLN: 4.0000 mg | Freq: Four times a day (QID) | INTRAMUSCULAR | Status: DC | PRN

## 2022-09-27 MED ORDER — ACETAMINOPHEN 325 MG PO TABS: 650.0000 mg | ORAL_TABLET | Freq: Four times a day (QID) | ORAL | Status: DC | PRN

## 2022-09-27 MED ORDER — PEG-KCL-NACL-NASULF-NA ASC-C 100 G PO SOLR: 1.0000 | Freq: Once | ORAL | Status: DC

## 2022-09-27 MED ORDER — GABAPENTIN 300 MG PO CAPS
600.0000 mg | ORAL_CAPSULE | Freq: Two times a day (BID) | ORAL | Status: DC
  Administered 2022-09-27 – 2022-09-30 (×6): 600 mg via ORAL
  Filled 2022-09-27 (×6): qty 2

## 2022-09-27 MED ORDER — ONDANSETRON HCL 4 MG PO TABS: 4.0000 mg | ORAL_TABLET | Freq: Four times a day (QID) | ORAL | Status: DC | PRN

## 2022-09-27 MED ORDER — SODIUM CHLORIDE 0.9 % IV SOLN: INTRAVENOUS | Status: DC

## 2022-09-27 MED ORDER — CYCLOBENZAPRINE HCL 10 MG PO TABS: 10.0000 mg | ORAL_TABLET | Freq: Every evening | ORAL | Status: DC | PRN

## 2022-09-27 MED ORDER — PEG-KCL-NACL-NASULF-NA ASC-C 100 G PO SOLR
0.5000 | Freq: Once | ORAL | Status: AC
  Administered 2022-09-27: 100 g via ORAL
  Filled 2022-09-27: qty 1

## 2022-09-27 MED ORDER — ACETAMINOPHEN 650 MG RE SUPP: 650.0000 mg | Freq: Four times a day (QID) | RECTAL | Status: DC | PRN

## 2022-09-27 MED ORDER — TAMSULOSIN HCL 0.4 MG PO CAPS
0.4000 mg | ORAL_CAPSULE | Freq: Every day | ORAL | Status: DC
  Administered 2022-09-27 – 2022-09-30 (×4): 0.4 mg via ORAL
  Filled 2022-09-27 (×4): qty 1

## 2022-09-27 MED ORDER — LACTATED RINGERS IV SOLN: INTRAVENOUS | Status: AC

## 2022-09-27 MED ORDER — METOPROLOL SUCCINATE ER 25 MG PO TB24
25.0000 mg | ORAL_TABLET | Freq: Every day | ORAL | Status: DC
  Administered 2022-09-30: 25 mg via ORAL
  Filled 2022-09-27 (×3): qty 1

## 2022-09-27 MED ORDER — MELATONIN 5 MG PO TABS
5.0000 mg | ORAL_TABLET | Freq: Every day | ORAL | Status: DC
  Administered 2022-09-27 – 2022-09-29 (×3): 5 mg via ORAL
  Filled 2022-09-27 (×3): qty 1

## 2022-09-27 MED ORDER — PEG-KCL-NACL-NASULF-NA ASC-C 100 G PO SOLR
0.5000 | Freq: Once | ORAL | Status: AC
  Administered 2022-09-28: 100 g via ORAL

## 2022-09-27 NOTE — TOC Progression Note (Signed)
Transition of Care [facility]) - Progression Note    Patient Details  Name: KYNGSTON AMORIN MRN: [id number] Date of Birth: Nov 07, 1945  Transition of Care [facility]) CM/SW [place], RN Phone [phone]  09/27/2022, 3:45 PM  Clinical Narrative:     Transition of Care [facility]) Screening Note   Patient Details  Name: MONSERRAT LINAREZ Date of Birth: [birth year]   Transition of Care [facility]) CM/SW Contact:    Angelita Ingles, RN Phone Number: 09/27/2022, 3:45 PM    Transition of Care Department [facility]) has reviewed patient and no TOC needs have been identified at this time. We will continue to monitor patient advancement through interdisciplinary progression rounds. If new patient transition needs arise, please place a TOC consult.          Expected Discharge Plan and Services                                               Social Determinants of Health (SDOH) Interventions SDOH Screenings   Food Insecurity: No Food Insecurity (01/31/2022)  Housing: Low Risk  (01/31/2022)  Transportation Needs: No Transportation Needs (01/31/2022)  Alcohol Screen: Low Risk  (01/31/2022)  Depression (PHQ2-9): Low Risk  (09/26/2022)  Recent Concern: Depression (PHQ2-9) - Medium Risk (08/31/2022)  Financial Resource Strain: Low Risk  (01/31/2022)  Physical Activity: Sufficiently Active (01/31/2022)  Social Connections: Moderately Isolated (01/31/2022)  Stress: No Stress Concern Present (01/31/2022)  Tobacco Use: Medium Risk (09/26/2022)    Readmission Risk Interventions     No data to display

## 2022-09-27 NOTE — Progress Notes (Signed)
PROGRESS NOTE    Russell [name]  [id number] DOB: 14-Feb-1946 DOA: 09/26/2022 PCP: [name], MD    Chief Complaint  Patient presents with   Low Hgb   Clot in leg    Brief Narrative:  HPI per Dr. 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  Patient was seen in on September 24, 2022 due to a sore spot on his right thigh.  He had a point-of-care ultrasound which diagnosed him with a superficial vein thrombosis.  He was started on Eliquis.  He was referred to the hospital for an ultrasound which she did have.  This was negative for a deep vein thrombosis.  CTPA was negative for PE.  The ultrasound did confirm that he had superficial thrombophlebitis of the mid greater saphenous vein.  He still had labs drawn at that time and his heme open came back today at 7.2 g/dL.  In February 2023, his hemoglobin then was 11.2.  Patient denies any gross melena.  He has had in the last 3 weeks no bloody bowel movements.  He denies any black or tarry sticky stools.   Patient states that he has been feeling more fatigued and tired but attributes this to his lumbar radiculopathy.  He is also under a lot of stress as his wife has terminal cancer.   Patient was seen again by his PCP and directed to the ER due to his hemoglobin.   EDP is contacted [facility] for consultation.   [facility] hospitalist contacted for admission.    ED Course: Hgb 6.8    Assessment & Plan:  Principal Problem:   Acute blood loss anemia Active Problems:   Prolapsed internal hemorrhoids, grade 2 and 3   GERD   Left lumbar radiculopathy   Neuropathic pain   Superficial vein thrombosis   History of cancer of lower lobe bronchus or lung - s/p left lower  lobectomy at [facility] 09-07-2021    Assessment and Plan: * Acute blood loss anemia Patient presented from PCPs office due to anemia.   -Patient noted to have had labs drawn which was 7.2 on 09/24/2022.  -Patient noted to have last hemoglobin of 12.7 on 05/16/2021 as noted on epic.  Patient noted to have had a hemoglobin of 7.1 prior to starting Eliquis.   -Hemoglobin repeat on presentation to the ED noted at 6.8.  -Anemia panel done consistent with a severe iron deficiency anemia with iron level of 9, ferritin of 6, TIBC of 396.  -Status post transfusion 2 units packed red blood cells with repeat hemoglobin at 7.5 today.  -FOBT negative.   -Had colonoscopy in November 2019 that removed 1 sessile polyp. -Patient noted to have intermittent dark stools and states stool was brown this morning. -Patient seen in consultation by GI who are recommending EGD and possible colonoscopy as well which will be done tomorrow. -Patient likely needs IV iron infusion during this hospitalization however will defer timing to GI. -Continue IV PPI. -Repeat H&H. -Transfusion threshold hemoglobin < 7.  -GI following and appreciate input and recommendations.  History of cancer of lower lobe bronchus or lung - s/p left lower lobectomy at [facility] 09-07-2021 Stable. Pt states he did not receive any chemo or radiation after lobectomy. Follows  with [facility]. Per their notes, pt has stage 1A3   Superficial vein thrombosis -Eliquis on hold.   -No DVT or PE noted on lower extremity Dopplers on CT angiogram chest.   -Continue to hold Eliquis pending GI evaluation.   -GI to advise when to resume Eliquis.   Neuropathic pain -Neurontin.  Left lumbar radiculopathy -Continue Neurontin 600 mg twice daily.   GERD -PPI.  Prolapsed internal hemorrhoids, grade 2 and 3 Chronic. 06/03/2018 Grade 3 left lateral and grade 2 right posterior right anterior Left lateral banded with 2 bands. 12/4 banded RP and LL (again with 2  bands) 08/01/2018 left lateral banded x2 -Follow         DVT prophylaxis: SCDs Code Status: Full Family Communication: Updated patient.  No family at bedside. Disposition: Home once clinically improved, cleared by GI.  Status is: Inpatient The patient will require care spanning > 2 midnights and should be moved to inpatient because: Severity of illness   Consultants:  Gastroenterology: Dr.Danis III 09/27/2022  Procedures:  Transfusion 2 units packed red blood cells 09/26/2022   Antimicrobials:  Anti-infectives (From admission, onward)    None         Subjective: Sitting up in bed.  Denies any overt bloody bowel movements.  States bowel movement this morning was brown.  Did have some complaints of generalized fatigue and weakness.  Denies any chest pain or significant shortness of breath.  States was just seen by GI PA who are scheduling him for endoscopy/colonoscopy tomorrow.  Objective: Vitals:   09/27/22 1430 09/27/22 1612 09/27/22 1613 09/27/22 1614  BP: 123/78  123/78   Pulse: 65  60   Resp: 20  (!) 80 20  Temp: 98.1 F (36.7 C)  98.1 F (36.7 C)   TempSrc: Oral  Oral   SpO2: 100%     Weight:  87.1 kg    Height:   6' (1.829 m)     Intake/Output Summary (Last 24 hours) at 09/27/2022 1701 Last data filed at 09/27/2022 0516 Gross per 24 hour  Intake 630 ml  Output --  Net 630 ml   Filed Weights   09/27/22 1612  Weight: 87.1 kg    Examination:  General exam: Appears calm and comfortable.  Pallor. Respiratory system: Clear to auscultation.  No wheezes, no crackles, no rhonchi.  Fair air movement.  Speaking in full sentences.  Respiratory effort normal. Cardiovascular system: S1 & S2 heard, RRR. No JVD, murmurs, rubs, gallops or clicks. No pedal edema. Gastrointestinal system: Abdomen is nondistended, soft and nontender. No organomegaly or masses felt. Normal bowel sounds heard. Central nervous system: Alert and oriented. No focal neurological  deficits. Extremities: Symmetric 5 x 5 power. Skin: No rashes, lesions or ulcers Psychiatry: Judgement and insight appear normal. Mood & affect appropriate.     Data Reviewed:   CBC: Recent Labs  Lab 09/24/22 1118 09/26/22 1203 09/26/22 1853 09/27/22 0307  WBC 4.8 5.2 5.2 5.0  NEUTROABS 3.3 2.8  --  2.4  HGB 7.2* 7.1* 6.8* 7.5*  HCT 24.6* 24.0* 23.3* 25.1*  MCV 62* 61.4* 61.3* 64.4*  PLT 340 316 331 [id number]    Basic Metabolic Panel: Recent Labs  Lab 09/24/22 1118 09/26/22 1853 09/27/22 0307  NA 136 135 137  K 4.0 3.8 4.2  CL 100 102 104  CO2 '22 24 24  '$ GLUCOSE 118* 101* 118*  BUN '13 18 18  '$ CREATININE 0.72* 0.80 0.73  CALCIUM 9.0 8.4* 8.2*    GFR:  Estimated Creatinine Clearance: 88.9 mL/min (by C-G formula based on SCr of 0.73 mg/dL).  Liver Function Tests: Recent Labs  Lab 09/26/22 1853 09/27/22 0307  AST 23 18  ALT 16 14  ALKPHOS 64 59  BILITOT 0.5 1.4*  PROT 7.2 6.3*  ALBUMIN 3.9 3.7    CBG: No results for input(s): "GLUCAP" in the last 168 hours.   No results found for this or any previous visit (from the past 240 hour(s)).       Radiology Studies: No results found.      Scheduled Meds:  sodium chloride   Intravenous Once   gabapentin  600 mg Oral BID   melatonin  5 mg Oral QHS   metoprolol succinate  25 mg Oral Daily   pantoprazole (PROTONIX) IV  40 mg Intravenous Q24H   tamsulosin  0.4 mg Oral Daily   Continuous Infusions:  sodium chloride       LOS: 0 days    Time spent: 40 minutes    Irine Seal, MD [facility]   To contact the attending provider between 7A-7P or the covering provider during after hours 7P-7A, please log into the web site [email or web address] and access using universal [place] password for that web site. If you do not have the password, please call the hospital operator.  09/27/2022, 5:01 PM

## 2022-09-27 NOTE — Hospital Course (Signed)
HPI per Dr. 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  Patient was seen in on September 24, 2022 due to a sore spot on his right thigh.  He had a point-of-care ultrasound which diagnosed him with a superficial vein thrombosis.  He was started on Eliquis.  He was referred to the hospital for an ultrasound which she did have.  This was negative for a deep vein thrombosis.  CTPA was negative for PE.  The ultrasound did confirm that he had superficial thrombophlebitis of the mid greater saphenous vein.  He still had labs drawn at that time and his heme open came back today at 7.2 g/dL.  In February 2023, his hemoglobin then was 11.2.  Patient denies any gross melena.  He has had in the last 3 weeks no bloody bowel movements.  He denies any black or tarry sticky stools.   Patient states that he has been feeling more fatigued and tired but attributes this to his lumbar radiculopathy.  He is also under a lot of stress as his wife has terminal cancer.   Patient was seen again by his PCP and directed to the ER due to his hemoglobin.   EDP is contacted  GI for consultation.   [facility] hospitalist contacted for admission.    ED Course: Hgb 6.8

## 2022-09-27 NOTE — Consult Note (Addendum)
Consultation Note   Referring Provider:  [facility] PCP: [name], MD Primary Gastroenterologist: Silvano Rusk, MD        Reason for consultation: GI bleed  Hospital Day: 2   ASSESSMENT   77 yo male with the following:   New iron deficiency anemia / intermittent dark heme negative stool. Hgb 6.8, down from 11.7 in November 2022.  Ferritin is 6.  He cannot say for how long he has had intermittent dark stools but stools dark only when constipated. Less than a gram improvement in hgb (7.5) following 2 u pRBCs. Etiology of IDA unclear. DDx. Bleeding from gastric polyps, PUD, small bowel AVMs. Gastrointestinal neoplasm unlikely   Saphenous vein thrombosis( recent), started on Eliquis 09/24/22.  Last dose was 3/13 am  Chronic constipation Takes Miralax as needed.   Hemorrhoids, s/p multiple bandings .  No rectal bleeding in several months  History of colon polyps.  Small sessile serrated polyp in 2019  PLAN   -Needs EGD, possibly colonoscopy as well.  The risks and benefits of EGD as well as colonoscopy with possible polypectomy / biopsies were discussed and the patient agrees to proceed.  -Continue daily PPI -Monitor hgb, transfused additional blood for hgb < 7.  - IV Fe infusion at some point.    History of Present Illness   Patient is a 77 y.o. year old male with a past medical history of  DDD, Raynaud's phenomenon, BPH , CVA, sleep apnea, GERD, non-small cell lung cancer s/p left lower lobe lobectomy, prolapsed internal hemorrhoids s/p banding,  colon polyps, history of cauda equina, chronic constipation, and recent superficial vein thrombosis     See PMH for any additional medical problems.  Patient saw PCP a few days ago with a cough and a spot on his leg.  Found to have  superficial thrombophlebitis. CT negative for PE. Labs drawn and started on Eliquis. Hgb returned as 7.2, down from baseline of 12.7 in November  2022. Patient sent to ED, hgb 6.8. Ferritin 6.   Russell [name] has intermittent dark stools but cannot say for how long and stools dark only when constipated. He has chronic constipation. He has been fatigued / short of breath for the last few weeks. No abdominal pain. No N/V. He was taking a daily baby ASA until PCP stopped it a few days ago. No other NSAID use. He is on PPI at home. He doesn't donate blood. No hematuria or epistaxis. No rectal bleeding several months.    Previous GI History / Evaluation :   Nov 2019 colonoscopy  -one diminutive polyp and internal hemorrhoids   Nov 2022 EGD - Grade A esophagitis. Multiple gastric polyps, biopsied. Gastritis, biopsied. Duodenitis  Surgical [P], gastric antrum and gastric body - ANTRAL AND CORPUS MUCOSA WITH MILD HYPEREMIA. - WARTHIN-STARRY STAIN NEGATIVE FOR HELICOBACTER PYLORI. - NO INTESTINAL METAPLASIA, DYSPLASIA, OR MALIGNANCY. 2. Surgical [P], gastric body, polyp (multiple) - BENIGN FUNDIC GLAND POLYPS. - NO INTESTINAL METAPLASIA, ADENOMATOUS CHANGE OR MALIGNANCY. 3. Surgical [P], cecum, polyp - SESSILE SERRATED POLYP WITHOUT DYSPLASIA (FOUR FRAGMENTS). - NO EVIDENCE OF MALIGNANCY.   Recent Labs    09/26/22 1203 09/26/22 1853 09/27/22 0307  WBC 5.2 5.2 5.0  HGB 7.1* 6.8* 7.5*  HCT 24.0* 23.3* 25.1*  PLT 316 331 283   Recent Labs    09/24/22 1118 09/26/22 1853 09/27/22 0307  NA 136 135 137  K 4.0 3.8 4.2  CL 100 102 104  CO2 '22 24 24  '$ GLUCOSE 118* 101* 118*  BUN '13 18 18  '$ CREATININE 0.72* 0.80 0.73  CALCIUM 9.0 8.4* 8.2*   Recent Labs    09/27/22 0307  PROT 6.3*  ALBUMIN 3.7  AST 18  ALT 14  ALKPHOS 59  BILITOT 1.4*   No results for input(s): "HEPBSAG", "HCVAB", "HEPAIGM", "HEPBIGM" in the last 72 hours. 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   anterior cer disc and fusion with plates C3-4 and D34-534 at [facility]   Constipation    Encounter for general adult medical examination with abnormal findings 06/27/2022   Enlarged prostate    GERD (gastroesophageal reflux disease)    Hemorrhoids    History of kidney stones    passed stones   History of lumbar laminectomy 05/12/2020   open L2-L5 [facility]   Hx of colonic polyp 05/29/2018   05/2018 cecal ssp   Lung nodule 02/2021   Maxillary sinus polyp    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   Prolapsed internal hemorrhoids, grade 3 09/10/2006   Qualifier: Diagnosis of  By: Jonna Munro MD, Cornelius     Salivary gland enlargement    Sleep apnea 10 years ago   declined CPAP   Stroke ([place]) 15-20 years ago    Past Surgical History:  Procedure Laterality Date   BRONCHIAL BIOPSY  05/16/2021   Procedure: BRONCHIAL BIOPSIES;  Surgeon: Garner Nash, DO;  Location: [place] [facility];  Service: Pulmonary;;   BRONCHIAL BRUSHINGS  05/16/2021   Procedure: BRONCHIAL BRUSHINGS;  Surgeon: Garner Nash, DO;  Location: [place];  Service: Pulmonary;;   BRONCHIAL NEEDLE ASPIRATION BIOPSY  05/16/2021   Procedure: BRONCHIAL NEEDLE ASPIRATION BIOPSIES;  Surgeon: Garner Nash, DO;  Location: [place] [facility];  Service: Pulmonary;;   CERVICAL SPINE SURGERY  04/10/2020   C3-C4 with pin at [facility]   COLONOSCOPY     FIDUCIAL MARKER PLACEMENT  05/16/2021   Procedure: FIDUCIAL MARKER PLACEMENT;  Surgeon: Garner Nash, DO;  Location: [place] [facility];  Service: Pulmonary;;   HEMORRHOID BANDING     LUMBAR LAMINECTOMY  03/08/2010   L2-3, cauda equina syndrome   lumbar laminectomy  05/12/2020   [facility]   SPINE SURGERY     UPPER GI ENDOSCOPY     VIDEO BRONCHOSCOPY WITH ENDOBRONCHIAL NAVIGATION Left 05/16/2021   Procedure: VIDEO BRONCHOSCOPY WITH  ENDOBRONCHIAL NAVIGATION;  Surgeon: Garner Nash, DO;  Location: [place];  Service: Pulmonary;  Laterality: Left;  ION w/ fiducial placement   VIDEO BRONCHOSCOPY WITH RADIAL ENDOBRONCHIAL ULTRASOUND  05/16/2021   Procedure: VIDEO BRONCHOSCOPY WITH RADIAL ENDOBRONCHIAL ULTRASOUND;  Surgeon: Garner Nash, DO;  Location: [name];  Service: Pulmonary;;   wisdom teeth ext      Family History  Problem Relation Age of Onset   Aortic aneurysm Mother    Varicose Veins Mother  Heart attack Father    Arthritis Father    Aortic aneurysm Sister    Early death Sister    CVA Sister    Aortic aneurysm Sister    Aortic aneurysm Sister    Colon cancer Neg Hx    Pancreatic cancer Neg Hx    Rectal cancer Neg Hx    Stomach cancer Neg Hx    Esophageal cancer Neg Hx    Colon polyps Neg Hx     Prior to Admission medications   Medication Sig Start Date End Date Taking? Authorizing Provider  APIXABAN (ELIQUIS) VTE STARTER PACK ('10MG'$  AND '5MG'$ ) Take as directed on package: start with two-'5mg'$  tablets twice daily for 7 days. On day 8, switch to one-'5mg'$  tablet twice daily. 09/24/22  Yes Lyndal Pulley, MD  ASHWAGANDHA PO Take 450 mg by mouth in the morning and at bedtime.   Yes [provider]  aspirin 81 MG EC tablet Take 81 mg by mouth every evening. 05/18/20  Yes [provider]  diclofenac Sodium (VOLTAREN) 1 % GEL Apply 2 g topically daily as needed (pain).   Yes [provider]  finasteride (PROSCAR) 5 MG tablet Take 1 tablet (5 mg total) by mouth daily. 01/22/22  Yes McKenzie, Candee Furbish, MD  gabapentin (NEURONTIN) 300 MG capsule Take 1 capsule (300 mg total) by mouth 3 (three) times daily. (Actually 300 mg in AM and 600 mg QHS) x 1 week, then 600 mg BID- for nerve pain Patient taking differently: Take 600 mg by mouth 2 (two) times daily. 08/31/22  Yes Lovorn, Jinny Blossom, MD  Magnesium 400 MG CAPS Take 400 mg by mouth 2 (two) times daily. asporotate   Yes [provider]  Melatonin 5 MG/ML LIQD Take 5 mg by mouth daily.   Yes [provider]  metoprolol succinate (TOPROL XL) 25 MG 24 hr tablet Take 1 tablet (25 mg total) by mouth daily. 08/07/22  Yes Troy Sine, MD  metroNIDAZOLE (METROGEL) 0.75 % gel Apply to face daily for rosacea 07/12/21  Yes Lavonna Monarch, MD  Omega-3 Fatty Acids (FISH OIL) 1200 MG CAPS Take 1,200 mg by mouth daily.   Yes [provider]  omeprazole (PRILOSEC) 40 MG capsule TAKE (1) CAPSULE BY MOUTH ONCE DAILY. 03/28/22  Yes Gatha Mayer, MD  polyethylene glycol powder (GLYCOLAX/MIRALAX) 17 GM/SCOOP powder Take 0.5 Containers by mouth daily as needed for moderate constipation or mild constipation.   Yes [provider]  Probiotic Product (PROBIOTIC DAILY PO) Take 1 tablet by mouth daily.   Yes [provider]  senna-docusate (SENOKOT-S) 8.6-50 MG tablet Take 2 tablets by mouth at bedtime as needed for mild constipation.   Yes [provider]  silodosin (RAPAFLO) 8 MG CAPS capsule Take 1 capsule (8 mg total) by mouth 2 (two) times daily. 02/26/22  Yes McKenzie, Candee Furbish, MD  TURMERIC PO Take 750 mg by mouth daily. curamed curcumin   Yes [provider]  cyclobenzaprine (FLEXERIL) 10 MG tablet Take 1 tablet (10 mg total) by mouth at bedtime. Patient not taking: Reported on 09/27/2022 06/27/22   [name], MD  hydrocortisone (ANUSOL-HC) 2.5 % rectal cream Place 1 application. rectally 2 (two) times daily. Patient not taking: Reported on 09/27/2022 11/06/21   Levin Erp, PA    Current Facility-Administered Medications  Medication Dose Route Frequency Provider Last Rate Last Admin   0.9 %  sodium chloride infusion (Manually program via Guardrails IV Fluids)  Intravenous Once Kristopher Oppenheim, DO       acetaminophen (TYLENOL) tablet 650 mg  650 mg Oral Q6H PRN Kristopher Oppenheim, DO       Or   acetaminophen (TYLENOL) suppository 650 mg  650 mg Rectal Q6H PRN Kristopher Oppenheim, DO       cyclobenzaprine (FLEXERIL) tablet 10 mg  10 mg Oral QHS PRN Kristopher Oppenheim, DO       gabapentin (NEURONTIN) capsule 600 mg  600 mg Oral BID Kristopher Oppenheim, DO       lactated ringers infusion   Intravenous Continuous Kristopher Oppenheim, DO 75 mL/hr at 09/27/22 0600 New Bag at 09/27/22 0600   melatonin tablet 5 mg  5 mg Oral QHS Kristopher Oppenheim, DO       metoprolol succinate (TOPROL-XL) 24 hr tablet 25 mg  25 mg Oral Daily Kristopher Oppenheim, DO       ondansetron [facility]) tablet 4 mg  4 mg Oral Q6H PRN Kristopher Oppenheim, DO       Or   ondansetron [facility]) injection 4 mg  4 mg Intravenous Q6H PRN Kristopher Oppenheim, DO       pantoprazole (PROTONIX) injection 40 mg  40 mg Intravenous Q24H Kristopher Oppenheim, DO   40 mg at 09/26/22 2125   tamsulosin (FLOMAX) capsule 0.4 mg  0.4 mg Oral Daily Kristopher Oppenheim, DO       Current Outpatient Medications  Medication Sig Dispense Refill   APIXABAN (ELIQUIS) VTE STARTER PACK ('10MG'$  AND '5MG'$ ) Take as directed on package: start with two-'5mg'$  tablets twice daily for 7 days. On day 8, switch to one-'5mg'$  tablet twice daily. 1 each 0   ASHWAGANDHA PO Take 450 mg by mouth in the morning and at bedtime.     aspirin 81 MG EC tablet Take 81 mg by mouth every evening.     diclofenac Sodium (VOLTAREN) 1 % GEL Apply 2 g topically daily as needed (pain).     finasteride (PROSCAR) 5 MG tablet Take 1 tablet (5 mg total) by mouth daily. 90 tablet 3   gabapentin (NEURONTIN) 300 MG capsule Take 1 capsule (300 mg total) by mouth 3 (three) times daily. (Actually 300 mg in AM and 600 mg QHS) x 1 week, then 600 mg BID- for nerve pain (Patient taking differently: Take 600 mg by mouth 2 (two) times daily.) 360 capsule 1   Magnesium 400 MG CAPS Take 400 mg by mouth 2 (two) times daily. asporotate     Melatonin 5 MG/ML LIQD Take 5 mg by mouth daily.     metoprolol succinate (TOPROL XL) 25 MG 24 hr tablet Take 1 tablet (25 mg total) by mouth daily. 90 tablet 3   metroNIDAZOLE (METROGEL) 0.75 % gel Apply to face daily for rosacea  45 g 4   Omega-3 Fatty Acids (FISH OIL) 1200 MG CAPS Take 1,200 mg by mouth daily.     omeprazole (PRILOSEC) 40 MG capsule TAKE (1) CAPSULE BY MOUTH ONCE DAILY. 90 capsule 3   polyethylene glycol powder (GLYCOLAX/MIRALAX) 17 GM/SCOOP powder Take 0.5 Containers by mouth daily as needed for moderate constipation or mild constipation.     Probiotic Product (PROBIOTIC DAILY PO) Take 1 tablet by mouth daily.     senna-docusate (SENOKOT-S) 8.6-50 MG tablet Take 2 tablets by mouth at bedtime as needed for mild constipation.     silodosin (RAPAFLO) 8 MG CAPS capsule Take 1 capsule (8 mg total) by mouth 2 (two) times daily. 30 capsule 11  TURMERIC PO Take 750 mg by mouth daily. curamed curcumin     cyclobenzaprine (FLEXERIL) 10 MG tablet Take 1 tablet (10 mg total) by mouth at bedtime. (Patient not taking: Reported on 09/27/2022) 30 tablet 5   hydrocortisone (ANUSOL-HC) 2.5 % rectal cream Place 1 application. rectally 2 (two) times daily. (Patient not taking: Reported on 09/27/2022) 30 g 1    Allergies as of 09/26/2022   (No Known Allergies)    Social History   Socioeconomic History   Marital status: Married    Spouse name: Not on file   Number of children: 0   Years of education: Not on file   Highest education level: Not on file  Occupational History   Occupation: retired  Tobacco Use   Smoking status: Former    Packs/day: 0.25    Years: 9.00    Additional pack years: 0.00    Total pack years: 2.25    Types: Cigarettes    Start date: 10/15/1965    Quit date: 10/16/1974    Years since quitting: 47.9    Passive exposure: Past   Smokeless tobacco: Never  Vaping Use   Vaping Use: Never used  Substance and Sexual Activity   Alcohol use: Not Currently    Comment: rarely wine   Drug use: No   Sexual activity: Not Currently  Other Topics Concern   Not on file  Social History Narrative   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   Social Determinants of Health   Financial Resource Strain: Low Risk  (01/31/2022)   Overall Financial Resource Strain (CARDIA)    Difficulty of Paying Living Expenses: Not hard at all  Food Insecurity: No Food Insecurity (01/31/2022)   Hunger Vital Sign    Worried About Running Out of Food in the Last Year: Never true    Ran Out of Food in the Last Year: Never true  Transportation Needs: No Transportation Needs (01/31/2022)   PRAPARE - Hydrologist (Medical): No    Lack of Transportation (Non-Medical): No  Physical Activity: Sufficiently Active (01/31/2022)   Exercise Vital Sign    Days of Exercise per Week: 7 days    Minutes of Exercise per Session: 60 min  Stress: No Stress Concern Present (01/31/2022)   [place]    Feeling of Stress : Not at all  Social Connections: Moderately Isolated (01/31/2022)   Social Connection and Isolation Panel [NHANES]    Frequency of Communication with Friends and Family: More than three times a week    Frequency of Social Gatherings with Friends and Family: More than three times a week    Attends Religious Services: Never    Marine scientist or Organizations: No    Attends Archivist Meetings: Never    Marital Status: Married  Human resources officer Violence: Not At Risk (01/31/2022)   Humiliation, Afraid, Rape, and Kick questionnaire    Fear of Current or Ex-Partner: No    Emotionally Abused: No    Physically Abused: No    Sexually Abused: No    Review of Systems: All systems reviewed and negative except where noted in HPI.  Physical Exam: Vital signs in last  24 hours: Temp:  [97.8 F (36.6 C)-98.9 F (37.2 C)] 98.3 F (36.8 C) (03/14 0515) Pulse Rate:  [35-94] 64 (03/14 0615) Resp:  [10-22] 22 (03/14 0615) BP:  (92-132)/(54-86) 112/71 (03/14 0600) SpO2:  [97 %-100 %] 98 % (03/14 0615) Weight:  [87.1 kg] 87.1 kg (03/13 1448)    General:  Alert male in NAD Psych:  Pleasant, cooperative. Normal mood and affect Eyes: Pupils equal Ears:  Normal auditory acuity Nose: No deformity, discharge or lesions Neck:  Supple, no masses felt Lungs:  Clear to auscultation.  Heart:  Regular rate, regular rhythm.  Abdomen:  Soft, nondistended, nontender, active bowel sounds, no masses felt Rectal :  Deferred Msk: Symmetrical without gross deformities.  Neurologic:  Alert, oriented, grossly normal neurologically Extremities : No edema Skin:  Intact without significant lesions.    Intake/Output from previous day: 03/13 0701 - 03/14 0700 In: 315 [Blood:315] Out: -  Intake/Output this shift:  No intake/output data recorded.    Principal Problem:   Acute blood loss anemia Active Problems:   Prolapsed internal hemorrhoids, grade 2 and 3   GERD   Left lumbar radiculopathy   Neuropathic pain   Superficial vein thrombosis   History of cancer of lower lobe bronchus or lung - s/p left lower lobectomy at [facility] 09-07-2021    Tye Savoy, NP-C @  09/27/2022, 8:42 AM  I have taken an interval history, thoroughly reviewed the chart and examined the patient. I agree with the Advanced Practitioner's note, impression and recommendations, and have recorded additional findings, impressions and recommendations below. I performed a substantive portion of this encounter (>50% time spent), including a complete performance of the medical decision making.  My additional thoughts are as follows:  Iron deficiency anemia, may have slowly occurred over the course of a year or better.  Recently discovered by primary care prompting admission.  Unclear why his hemoglobin did not improve as expected with 2 units PRBCs, as he has not overtly GI bleeding.  Stool was heme-negative as well  History of intermittent hemorrhoidal  bleeding, but none recently.  Chronic constipation due to his cauda equina syndrome.   Tentative plan is for EGD and colonoscopy with me tomorrow if he is able to manage logistics of the bowel preparation.  This will be challenging if he does not get to a regular medical bed later today (he has been in the ED 20 hours already).  If bowel prep not feasible overnight, then EGD tomorrow and colonoscopy the following day.  He was agreeable after discussion of procedure and risks  The benefits and risks of the planned procedure were described in detail with the patient or (when appropriate) their health care proxy.  Risks were outlined as including, but not limited to, bleeding, infection, perforation, adverse medication reaction leading to cardiac or pulmonary decompensation, pancreatitis (if ERCP).  The limitation of incomplete mucosal visualization was also discussed.  No guarantees or warranties were given.  Please administer this patient IV iron.   Nelida Meuse III [phone]

## 2022-09-27 NOTE — H&P (View-Only) (Signed)
                                             Consultation Note   Referring Provider:  [facility] PCP: Patel, Rutwik K, MD Primary Gastroenterologist: Carl Gessner, MD        Reason for consultation: GI bleed  Hospital Day: 2   ASSESSMENT   77 yo male with the following:   New iron deficiency anemia / intermittent dark heme negative stool. Hgb 6.8, down from 11.7 in November 2022.  Ferritin is 6.  He cannot say for how long he has had intermittent dark stools but stools dark only when constipated. Less than a gram improvement in hgb (7.5) following 2 u pRBCs. Etiology of IDA unclear. DDx. Bleeding from gastric polyps, PUD, small bowel AVMs. Gastrointestinal neoplasm unlikely   Saphenous vein thrombosis( recent), started on Eliquis 09/24/22.  Last dose was 3/13 am  Chronic constipation Takes Miralax as needed.   Hemorrhoids, s/p multiple bandings .  No rectal bleeding in several months  History of colon polyps.  Small sessile serrated polyp in 2019  PLAN   -Needs EGD, possibly colonoscopy as well.  The risks and benefits of EGD as well as colonoscopy with possible polypectomy / biopsies were discussed and the patient agrees to proceed.  -Continue daily PPI -Monitor hgb, transfused additional blood for hgb < 7.  - IV Fe infusion at some point.    History of Present Illness   Patient is a 77 y.o. year old male with a past medical history of  DDD, Raynaud's phenomenon, BPH , CVA, sleep apnea, GERD, non-small cell lung cancer s/p left lower lobe lobectomy, prolapsed internal hemorrhoids s/p banding,  colon polyps, history of cauda equina, chronic constipation, and recent superficial vein thrombosis     See PMH for any additional medical problems.  Patient saw PCP a few days ago with a cough and a spot on his leg.  Found to have  superficial thrombophlebitis. CT negative for PE. Labs drawn and started on Eliquis. Hgb returned as 7.2, down from baseline of 12.7 in November  2022. Patient sent to ED, hgb 6.8. Ferritin 6.   Allen has intermittent dark stools but cannot say for how long and stools dark only when constipated. He has chronic constipation. He has been fatigued / short of breath for the last few weeks. No abdominal pain. No N/V. He was taking a daily baby ASA until PCP stopped it a few days ago. No other NSAID use. He is on PPI at home. He doesn't donate blood. No hematuria or epistaxis. No rectal bleeding several months.    Previous GI History / Evaluation :   Nov 2019 colonoscopy  -one diminutive polyp and internal hemorrhoids   Nov 2022 EGD - Grade A esophagitis. Multiple gastric polyps, biopsied. Gastritis, biopsied. Duodenitis  Surgical [P], gastric antrum and gastric body - ANTRAL AND CORPUS MUCOSA WITH MILD HYPEREMIA. - WARTHIN-STARRY STAIN NEGATIVE FOR HELICOBACTER PYLORI. - NO INTESTINAL METAPLASIA, DYSPLASIA, OR MALIGNANCY. 2. Surgical [P], gastric body, polyp (multiple) - BENIGN FUNDIC GLAND POLYPS. - NO INTESTINAL METAPLASIA, ADENOMATOUS CHANGE OR MALIGNANCY. 3. Surgical [P], cecum, polyp - SESSILE SERRATED POLYP WITHOUT DYSPLASIA (FOUR FRAGMENTS). - NO EVIDENCE OF MALIGNANCY.   Recent Labs    09/26/22 1203 09/26/22 1853 09/27/22 0307  WBC 5.2 5.2 5.0  HGB 7.1* 6.8* 7.5*    HCT 24.0* 23.3* 25.1*  PLT 316 331 283   Recent Labs    09/24/22 1118 09/26/22 1853 09/27/22 0307  NA 136 135 137  K 4.0 3.8 4.2  CL 100 102 104  CO2 22 24 24  GLUCOSE 118* 101* 118*  BUN 13 18 18  CREATININE 0.72* 0.80 0.73  CALCIUM 9.0 8.4* 8.2*   Recent Labs    09/27/22 0307  PROT 6.3*  ALBUMIN 3.7  AST 18  ALT 14  ALKPHOS 59  BILITOT 1.4*   No results for input(s): "HEPBSAG", "HCVAB", "HEPAIGM", "HEPBIGM" in the last 72 hours. 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   anterior cer disc and fusion with plates C3-4 and C4-5 at [facility]   Constipation    Encounter for general adult medical examination with abnormal findings 06/27/2022   Enlarged prostate    GERD (gastroesophageal reflux disease)    Hemorrhoids    History of kidney stones    passed stones   History of lumbar laminectomy 05/12/2020   open L2-L5 at [facility]   Hx of colonic polyp 05/29/2018   05/2018 cecal ssp   Lung nodule 02/2021   Maxillary sinus polyp    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   Prolapsed internal hemorrhoids, grade 3 09/10/2006   Qualifier: Diagnosis of  By: Ferreira MD, Cornelius     Salivary gland enlargement    Sleep apnea 10 years ago   declined CPAP   Stroke (HCC) 15-20 years ago    Past Surgical History:  Procedure Laterality Date   BRONCHIAL BIOPSY  05/16/2021   Procedure: BRONCHIAL BIOPSIES;  Surgeon: Icard, Bradley L, DO;  Location: [name];  Service: Pulmonary;;   BRONCHIAL BRUSHINGS  05/16/2021   Procedure: BRONCHIAL BRUSHINGS;  Surgeon: Icard, Bradley L, DO;  Location: [name];  Service: Pulmonary;;   BRONCHIAL NEEDLE ASPIRATION BIOPSY  05/16/2021   Procedure: BRONCHIAL NEEDLE ASPIRATION BIOPSIES;  Surgeon: Icard, Bradley L, DO;  Location: [name];  Service: Pulmonary;;   CERVICAL SPINE SURGERY  04/10/2020   C3-C4 with pin at [facility]   COLONOSCOPY     FIDUCIAL MARKER PLACEMENT  05/16/2021   Procedure: FIDUCIAL MARKER PLACEMENT;  Surgeon: Icard, Bradley L, DO;  Location: [name];  Service: Pulmonary;;   HEMORRHOID BANDING     LUMBAR LAMINECTOMY  03/08/2010   L2-3, cauda equina syndrome   lumbar laminectomy  05/12/2020   [facility]   SPINE SURGERY     UPPER GI ENDOSCOPY     VIDEO BRONCHOSCOPY WITH ENDOBRONCHIAL NAVIGATION Left 05/16/2021   Procedure: VIDEO BRONCHOSCOPY WITH  ENDOBRONCHIAL NAVIGATION;  Surgeon: Icard, Bradley L, DO;  Location: [name];  Service: Pulmonary;  Laterality: Left;  ION w/ fiducial placement   VIDEO BRONCHOSCOPY WITH RADIAL ENDOBRONCHIAL ULTRASOUND  05/16/2021   Procedure: VIDEO BRONCHOSCOPY WITH RADIAL ENDOBRONCHIAL ULTRASOUND;  Surgeon: Icard, Bradley L, DO;  Location: [name];  Service: Pulmonary;;   wisdom teeth ext      Family History  Problem Relation Age of Onset   Aortic aneurysm Mother    Varicose Veins Mother      Heart attack Father    Arthritis Father    Aortic aneurysm Sister    Early death Sister    CVA Sister    Aortic aneurysm Sister    Aortic aneurysm Sister    Colon cancer Neg Hx    Pancreatic cancer Neg Hx    Rectal cancer Neg Hx    Stomach cancer Neg Hx    Esophageal cancer Neg Hx    Colon polyps Neg Hx     Prior to Admission medications   Medication Sig Start Date End Date Taking? Authorizing Provider  APIXABAN (ELIQUIS) VTE STARTER PACK (10MG AND 5MG) Take as directed on package: start with two-5mg tablets twice daily for 7 days. On day 8, switch to one-5mg tablet twice daily. 09/24/22  Yes Steen, James J, MD  ASHWAGANDHA PO Take 450 mg by mouth in the morning and at bedtime.   Yes [provider]  aspirin 81 MG EC tablet Take 81 mg by mouth every evening. 05/18/20  Yes [provider]  diclofenac Sodium (VOLTAREN) 1 % GEL Apply 2 g topically daily as needed (pain).   Yes [provider]  finasteride (PROSCAR) 5 MG tablet Take 1 tablet (5 mg total) by mouth daily. 01/22/22  Yes McKenzie, Patrick L, MD  gabapentin (NEURONTIN) 300 MG capsule Take 1 capsule (300 mg total) by mouth 3 (three) times daily. (Actually 300 mg in AM and 600 mg QHS) x 1 week, then 600 mg BID- for nerve pain Patient taking differently: Take 600 mg by mouth 2 (two) times daily. 08/31/22  Yes Lovorn, Megan, MD  Magnesium 400 MG CAPS Take 400 mg by mouth 2 (two) times daily. asporotate   Yes [provider]  Melatonin 5 MG/ML LIQD Take 5 mg by mouth daily.   Yes [provider]  metoprolol succinate (TOPROL XL) 25 MG 24 hr tablet Take 1 tablet (25 mg total) by mouth daily. 08/07/22  Yes Kelly, Thomas A, MD  metroNIDAZOLE (METROGEL) 0.75 % gel Apply to face daily for rosacea 07/12/21  Yes Tafeen, Stuart, MD  Omega-3 Fatty Acids (FISH OIL) 1200 MG CAPS Take 1,200 mg by mouth daily.   Yes [provider]  omeprazole (PRILOSEC) 40 MG capsule TAKE (1) CAPSULE BY MOUTH ONCE DAILY. 03/28/22  Yes Gessner, Carl E, MD  polyethylene glycol powder (GLYCOLAX/MIRALAX) 17 GM/SCOOP powder Take 0.5 Containers by mouth daily as needed for moderate constipation or mild constipation.   Yes [provider]  Probiotic Product (PROBIOTIC DAILY PO) Take 1 tablet by mouth daily.   Yes [provider]  senna-docusate (SENOKOT-S) 8.6-50 MG tablet Take 2 tablets by mouth at bedtime as needed for mild constipation.   Yes [provider]  silodosin (RAPAFLO) 8 MG CAPS capsule Take 1 capsule (8 mg total) by mouth 2 (two) times daily. 02/26/22  Yes McKenzie, Patrick L, MD  TURMERIC PO Take 750 mg by mouth daily. curamed curcumin   Yes [provider]  cyclobenzaprine (FLEXERIL) 10 MG tablet Take 1 tablet (10 mg total) by mouth at bedtime. Patient not taking: Reported on 09/27/2022 06/27/22   Patel, Rutwik K, MD  hydrocortisone (ANUSOL-HC) 2.5 % rectal cream Place 1 application. rectally 2 (two) times daily. Patient not taking: Reported on 09/27/2022 11/06/21   Lemmon, Jennifer Lynne, PA    Current Facility-Administered Medications  Medication Dose Route Frequency Provider Last Rate Last Admin   0.9 %  sodium chloride infusion (Manually program via Guardrails IV Fluids)     Intravenous Once Chen, Eric, DO       acetaminophen (TYLENOL) tablet 650 mg  650 mg Oral Q6H PRN Chen, Eric, DO       Or   acetaminophen (TYLENOL) suppository 650 mg  650 mg Rectal Q6H PRN Chen,  Eric, DO       cyclobenzaprine (FLEXERIL) tablet 10 mg  10 mg Oral QHS PRN Chen, Eric, DO       gabapentin (NEURONTIN) capsule 600 mg  600 mg Oral BID Chen, Eric, DO       lactated ringers infusion   Intravenous Continuous Chen, Eric, DO 75 mL/hr at 09/27/22 0600 New Bag at 09/27/22 0600   melatonin tablet 5 mg  5 mg Oral QHS Chen, Eric, DO       metoprolol succinate (TOPROL-XL) 24 hr tablet 25 mg  25 mg Oral Daily Chen, Eric, DO       ondansetron (ZOFRAN) tablet 4 mg  4 mg Oral Q6H PRN Chen, Eric, DO       Or   ondansetron (ZOFRAN) injection 4 mg  4 mg Intravenous Q6H PRN Chen, Eric, DO       pantoprazole (PROTONIX) injection 40 mg  40 mg Intravenous Q24H Chen, Eric, DO   40 mg at 09/26/22 2125   tamsulosin (FLOMAX) capsule 0.4 mg  0.4 mg Oral Daily Chen, Eric, DO       Current Outpatient Medications  Medication Sig Dispense Refill   APIXABAN (ELIQUIS) VTE STARTER PACK (10MG AND 5MG) Take as directed on package: start with two-5mg tablets twice daily for 7 days. On day 8, switch to one-5mg tablet twice daily. 1 each 0   ASHWAGANDHA PO Take 450 mg by mouth in the morning and at bedtime.     aspirin 81 MG EC tablet Take 81 mg by mouth every evening.     diclofenac Sodium (VOLTAREN) 1 % GEL Apply 2 g topically daily as needed (pain).     finasteride (PROSCAR) 5 MG tablet Take 1 tablet (5 mg total) by mouth daily. 90 tablet 3   gabapentin (NEURONTIN) 300 MG capsule Take 1 capsule (300 mg total) by mouth 3 (three) times daily. (Actually 300 mg in AM and 600 mg QHS) x 1 week, then 600 mg BID- for nerve pain (Patient taking differently: Take 600 mg by mouth 2 (two) times daily.) 360 capsule 1   Magnesium 400 MG CAPS Take 400 mg by mouth 2 (two) times daily. asporotate     Melatonin 5 MG/ML LIQD Take 5 mg by mouth daily.     metoprolol succinate (TOPROL XL) 25 MG 24 hr tablet Take 1 tablet (25 mg total) by mouth daily. 90 tablet 3   metroNIDAZOLE (METROGEL) 0.75 % gel Apply to face daily for rosacea  45 g 4   Omega-3 Fatty Acids (FISH OIL) 1200 MG CAPS Take 1,200 mg by mouth daily.     omeprazole (PRILOSEC) 40 MG capsule TAKE (1) CAPSULE BY MOUTH ONCE DAILY. 90 capsule 3   polyethylene glycol powder (GLYCOLAX/MIRALAX) 17 GM/SCOOP powder Take 0.5 Containers by mouth daily as needed for moderate constipation or mild constipation.     Probiotic Product (PROBIOTIC DAILY PO) Take 1 tablet by mouth daily.     senna-docusate (SENOKOT-S) 8.6-50 MG tablet Take 2 tablets by mouth at bedtime as needed for mild constipation.     silodosin (RAPAFLO) 8 MG CAPS capsule Take 1 capsule (8 mg total) by mouth 2 (two) times daily. 30 capsule 11     TURMERIC PO Take 750 mg by mouth daily. curamed curcumin     cyclobenzaprine (FLEXERIL) 10 MG tablet Take 1 tablet (10 mg total) by mouth at bedtime. (Patient not taking: Reported on 09/27/2022) 30 tablet 5   hydrocortisone (ANUSOL-HC) 2.5 % rectal cream Place 1 application. rectally 2 (two) times daily. (Patient not taking: Reported on 09/27/2022) 30 g 1    Allergies as of 09/26/2022   (No Known Allergies)    Social History   Socioeconomic History   Marital status: Married    Spouse name: Not on file   Number of children: 0   Years of education: Not on file   Highest education level: Not on file  Occupational History   Occupation: retired  Tobacco Use   Smoking status: Former    Packs/day: 0.25    Years: 9.00    Additional pack years: 0.00    Total pack years: 2.25    Types: Cigarettes    Start date: 10/15/1965    Quit date: 10/16/1974    Years since quitting: 47.9    Passive exposure: Past   Smokeless tobacco: Never  Vaping Use   Vaping Use: Never used  Substance and Sexual Activity   Alcohol use: Not Currently    Comment: rarely wine   Drug use: No   Sexual activity: Not Currently  Other Topics Concern   Not on file  Social History Narrative   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   Social Determinants of Health   Financial Resource Strain: Low Risk  (01/31/2022)   Overall Financial Resource Strain (CARDIA)    Difficulty of Paying Living Expenses: Not hard at all  Food Insecurity: No Food Insecurity (01/31/2022)   Hunger Vital Sign    Worried About Running Out of Food in the Last Year: Never true    Ran Out of Food in the Last Year: Never true  Transportation Needs: No Transportation Needs (01/31/2022)   PRAPARE - Transportation    Lack of Transportation (Medical): No    Lack of Transportation (Non-Medical): No  Physical Activity: Sufficiently Active (01/31/2022)   Exercise Vital Sign    Days of Exercise per Week: 7 days    Minutes of Exercise per Session: 60 min  Stress: No Stress Concern Present (01/31/2022)   [employer] - Occupational Stress Questionnaire    Feeling of Stress : Not at all  Social Connections: Moderately Isolated (01/31/2022)   Social Connection and Isolation Panel [NHANES]    Frequency of Communication with Friends and Family: More than three times a week    Frequency of Social Gatherings with Friends and Family: More than three times a week    Attends Religious Services: Never    Active Member of Clubs or Organizations: No    Attends Club or Organization Meetings: Never    Marital Status: Married  Intimate Partner Violence: Not At Risk (01/31/2022)   Humiliation, Afraid, Rape, and Kick questionnaire    Fear of Current or Ex-Partner: No    Emotionally Abused: No    Physically Abused: No    Sexually Abused: No    Review of Systems: All systems reviewed and negative except where noted in HPI.  Physical Exam: Vital signs in last   24 hours: Temp:  [97.8 F (36.6 C)-98.9 F (37.2 C)] 98.3 F (36.8 C) (03/14 0515) Pulse Rate:  [35-94] 64 (03/14 0615) Resp:  [10-22] 22 (03/14 0615) BP:  (92-132)/(54-86) 112/71 (03/14 0600) SpO2:  [97 %-100 %] 98 % (03/14 0615) Weight:  [87.1 kg] 87.1 kg (03/13 1448)    General:  Alert male in NAD Psych:  Pleasant, cooperative. Normal mood and affect Eyes: Pupils equal Ears:  Normal auditory acuity Nose: No deformity, discharge or lesions Neck:  Supple, no masses felt Lungs:  Clear to auscultation.  Heart:  Regular rate, regular rhythm.  Abdomen:  Soft, nondistended, nontender, active bowel sounds, no masses felt Rectal :  Deferred Msk: Symmetrical without gross deformities.  Neurologic:  Alert, oriented, grossly normal neurologically Extremities : No edema Skin:  Intact without significant lesions.    Intake/Output from previous day: 03/13 0701 - 03/14 0700 In: 315 [Blood:315] Out: -  Intake/Output this shift:  No intake/output data recorded.    Principal Problem:   Acute blood loss anemia Active Problems:   Prolapsed internal hemorrhoids, grade 2 and 3   GERD   Left lumbar radiculopathy   Neuropathic pain   Superficial vein thrombosis   History of cancer of lower lobe bronchus or lung - s/p left lower lobectomy at [facility] 09-07-2021    Paula Guenther, NP-C @  09/27/2022, 8:42 AM  I have taken an interval history, thoroughly reviewed the chart and examined the patient. I agree with the Advanced Practitioner's note, impression and recommendations, and have recorded additional findings, impressions and recommendations below. I performed a substantive portion of this encounter (>50% time spent), including a complete performance of the medical decision making.  My additional thoughts are as follows:  Iron deficiency anemia, may have slowly occurred over the course of a year or better.  Recently discovered by primary care prompting admission.  Unclear why his hemoglobin did not improve as expected with 2 units PRBCs, as he has not overtly GI bleeding.  Stool was heme-negative as well  History of intermittent hemorrhoidal  bleeding, but none recently.  Chronic constipation due to his cauda equina syndrome.   Tentative plan is for EGD and colonoscopy with me tomorrow if he is able to manage logistics of the bowel preparation.  This will be challenging if he does not get to a regular medical bed later today (he has been in the ED 20 hours already).  If bowel prep not feasible overnight, then EGD tomorrow and colonoscopy the following day.  He was agreeable after discussion of procedure and risks  The benefits and risks of the planned procedure were described in detail with the patient or (when appropriate) their health care proxy.  Risks were outlined as including, but not limited to, bleeding, infection, perforation, adverse medication reaction leading to cardiac or pulmonary decompensation, pancreatitis (if ERCP).  The limitation of incomplete mucosal visualization was also discussed.  No guarantees or warranties were given.  Please administer this patient IV iron.   Adriene Knipfer L Danis III Office:[phone]    

## 2022-09-27 NOTE — Telephone Encounter (Signed)
He is at [facility].  We will see him today  - HD

## 2022-09-28 ENCOUNTER — Inpatient Hospital Stay (HOSPITAL_COMMUNITY): Payer: Medicare PPO | Admitting: Anesthesiology

## 2022-09-28 ENCOUNTER — Encounter (HOSPITAL_COMMUNITY)
Admission: EM | Disposition: A | Discharge status: Home / Self Care | Payer: Self-pay | Source: Home / Self Care | Attending: Internal Medicine

## 2022-09-28 ENCOUNTER — Encounter (HOSPITAL_COMMUNITY): Payer: Self-pay | Admitting: Internal Medicine

## 2022-09-28 DIAGNOSIS — D649 Anemia, unspecified: Secondary | ICD-10-CM

## 2022-09-28 DIAGNOSIS — D62 Acute posthemorrhagic anemia: Secondary | ICD-10-CM | POA: Diagnosis not present

## 2022-09-28 DIAGNOSIS — D490 Neoplasm of unspecified behavior of digestive system: Secondary | ICD-10-CM

## 2022-09-28 DIAGNOSIS — G4730 Sleep apnea, unspecified: Secondary | ICD-10-CM | POA: Diagnosis not present

## 2022-09-28 DIAGNOSIS — K648 Other hemorrhoids: Secondary | ICD-10-CM

## 2022-09-28 DIAGNOSIS — K921 Melena: Secondary | ICD-10-CM

## 2022-09-28 DIAGNOSIS — D509 Iron deficiency anemia, unspecified: Secondary | ICD-10-CM

## 2022-09-28 DIAGNOSIS — Z87891 Personal history of nicotine dependence: Secondary | ICD-10-CM

## 2022-09-28 DIAGNOSIS — C189 Malignant neoplasm of colon, unspecified: Secondary | ICD-10-CM

## 2022-09-28 DIAGNOSIS — D500 Iron deficiency anemia secondary to blood loss (chronic): Secondary | ICD-10-CM | POA: Diagnosis not present

## 2022-09-28 HISTORY — PX: BIOPSY: SHX5522

## 2022-09-28 HISTORY — PX: ESOPHAGOGASTRODUODENOSCOPY (EGD) WITH PROPOFOL: SHX5813

## 2022-09-28 HISTORY — PX: COLONOSCOPY WITH PROPOFOL: SHX5780

## 2022-09-28 LAB — BPAM RBC
Blood Product Expiration Date: 202403302359
Blood Product Expiration Date: 202404092359
ISSUE DATE / TIME: 202403132320
ISSUE DATE / TIME: 202403140243
Unit Type and Rh: 600
Unit Type and Rh: 6200

## 2022-09-28 LAB — BASIC METABOLIC PANEL
Anion gap: 9 (ref 5–15)
BUN: 12 mg/dL (ref 8–23)
CO2: 23 mmol/L (ref 22–32)
Calcium: 8.6 mg/dL — ABNORMAL LOW (ref 8.9–10.3)
Chloride: 108 mmol/L (ref 98–111)
Creatinine, Ser: 0.62 mg/dL (ref 0.61–1.24)
GFR, Estimated: >60 mL/min (ref >60)
Glucose, Bld: 88 mg/dL (ref 70–99)
Potassium: 3.7 mmol/L (ref 3.5–5.1)
Sodium: 140 mmol/L (ref 135–145)

## 2022-09-28 LAB — TYPE AND SCREEN
ABO/RH(D): A POS
Antibody Screen: NEGATIVE
Unit division: 0
Unit division: 0

## 2022-09-28 LAB — CBC WITH DIFFERENTIAL/PLATELET
Abs Immature Granulocytes: 0.01 10*3/uL (ref 0.00–0.07)
Basophils Absolute: 0 10*3/uL (ref 0.0–0.1)
Basophils Relative: 1 %
Eosinophils Absolute: 0.1 10*3/uL (ref 0.0–0.5)
Eosinophils Relative: 2 %
HCT: 28.1 % — ABNORMAL LOW (ref 39.0–52.0)
Hemoglobin: 8.4 g/dL — ABNORMAL LOW (ref 13.0–17.0)
Immature Granulocytes: 0 %
Lymphocytes Relative: 27 %
Lymphs Abs: 1.3 10*3/uL (ref 0.7–4.0)
MCH: 19.6 pg — ABNORMAL LOW (ref 26.0–34.0)
MCHC: 29.9 g/dL — ABNORMAL LOW (ref 30.0–36.0)
MCV: 65.5 fL — ABNORMAL LOW (ref 80.0–100.0)
Monocytes Absolute: 0.5 10*3/uL (ref 0.1–1.0)
Monocytes Relative: 11 %
Neutro Abs: 2.9 10*3/uL (ref 1.7–7.7)
Neutrophils Relative %: 59 %
Platelets: 296 10*3/uL (ref 150–400)
RBC: 4.29 MIL/uL (ref 4.22–5.81)
RDW: 24.1 % — ABNORMAL HIGH (ref 11.5–15.5)
WBC: 4.9 10*3/uL (ref 4.0–10.5)
nRBC: 0 % (ref 0.0–0.2)

## 2022-09-28 LAB — MAGNESIUM: Magnesium: 2.3 mg/dL (ref 1.7–2.4)

## 2022-09-28 SURGERY — ESOPHAGOGASTRODUODENOSCOPY (EGD) WITH PROPOFOL
Anesthesia: Monitor Anesthesia Care

## 2022-09-28 MED ORDER — SODIUM CHLORIDE 0.9 % IV SOLN
250.0000 mg | Freq: Every day | INTRAVENOUS | Status: AC
  Administered 2022-09-28 – 2022-09-29 (×2): 250 mg via INTRAVENOUS
  Filled 2022-09-28 (×2): qty 20

## 2022-09-28 MED ORDER — LACTATED RINGERS IV SOLN: Freq: Once | INTRAVENOUS | Status: AC

## 2022-09-28 MED ORDER — LIDOCAINE 2% (20 MG/ML) 5 ML SYRINGE
INTRAMUSCULAR | Status: DC | PRN
  Administered 2022-09-28: 60 mg via INTRAVENOUS

## 2022-09-28 MED ORDER — LACTATED RINGERS IV SOLN: INTRAVENOUS | Status: DC | PRN

## 2022-09-28 MED ORDER — EPHEDRINE SULFATE-NACL 50-0.9 MG/10ML-% IV SOSY
PREFILLED_SYRINGE | INTRAVENOUS | Status: DC | PRN
  Administered 2022-09-28 (×2): 10 mg via INTRAVENOUS
  Administered 2022-09-28: 5 mg via INTRAVENOUS

## 2022-09-28 MED ORDER — PROPOFOL 10 MG/ML IV BOLUS
INTRAVENOUS | Status: DC | PRN
  Administered 2022-09-28: 50 mg via INTRAVENOUS
  Administered 2022-09-28: 20 mg via INTRAVENOUS
  Administered 2022-09-28: 30 mg via INTRAVENOUS

## 2022-09-28 MED ORDER — PROPOFOL 500 MG/50ML IV EMUL
INTRAVENOUS | Status: DC | PRN
  Administered 2022-09-28: 150 ug/kg/min via INTRAVENOUS

## 2022-09-28 SURGICAL SUPPLY — 25 items

## 2022-09-28 NOTE — Op Note (Signed)
[facility] Patient Name: Russell [name] Procedure Date: 09/28/2022 MRN: [id number] Attending MD: Estill Cotta. Loletha Carrow , MD, [id number] Date of Birth: [birth year] CSN: [id number] Age: 77 Admit Type: Inpatient Procedure:                Upper GI endoscopy Indications:              Iron deficiency anemia                           see inpatient consult note                           heme negative on check in ED Providers:                Mallie Mussel L. Loletha Carrow, MD, Adah Perl RN, RN,                            Cherylynn Ridges, Technician Referring MD:             [facility] Medicines:                Monitored Anesthesia Care Complications:            No immediate complications. Estimated Blood Loss:     Estimated blood loss was minimal. Procedure:                Pre-Anesthesia Assessment:                           - Prior to the procedure, a History and Physical                            was performed, and patient medications and                            allergies were reviewed. The patient's tolerance of                            previous anesthesia was also reviewed. The risks                            and benefits of the procedure and the sedation                            options and risks were discussed with the patient.                            All questions were answered, and informed consent                            was obtained. Prior Anticoagulants: The patient has                            taken Eliquis (apixaban), last dose was 2 days  prior to procedure. ASA Grade Assessment: III - A                            patient with severe systemic disease. After                            reviewing the risks and benefits, the patient was                            deemed in satisfactory condition to undergo the                            procedure.                           After obtaining informed consent, the endoscope was                             passed under direct vision. Throughout the                            procedure, the patient's blood pressure, pulse, and                            oxygen saturations were monitored continuously. The                            [id number] Olympus endoscope was introduced                            through the mouth, and advanced to the second part                            of duodenum. The upper GI endoscopy was                            accomplished without difficulty. The patient                            tolerated the procedure well. Scope In: Scope Out: Findings:      The esophagus was normal.      The stomach was normal.      The cardia and gastric fundus were normal on retroflexion.      Normal mucosa was found in the entire duodenum. Biopsies for histology       were taken with a cold forceps for evaluation of celiac disease. Impression:               - Normal esophagus.                           - Normal stomach.                           - Normal mucosa was found in the entire examined  duodenum. Biopsied. Moderate Sedation:      MAC sedation used Recommendation:           - Return patient to hospital ward for ongoing care.                           - Resume regular diet.                           - See the other procedure note for documentation of                            additional recommendations. Procedure Code(s):        --- Professional ---                           [phone], Esophagogastroduodenoscopy, flexible,                            transoral; with biopsy, single or multiple Diagnosis Code(s):        --- Professional ---                           D50.9, Iron deficiency anemia, unspecified CPT copyright 2022 American Medical Association. All rights reserved. The codes documented in this report are preliminary and upon coder review may  be revised to meet current compliance requirements. Genae Strine L. Loletha Carrow,  MD 09/28/2022 2:33:46 PM This report has been signed electronically. Number of Addenda: 0

## 2022-09-28 NOTE — Interval H&P Note (Signed)
History and Physical Interval Note:  09/28/2022 1:42 PM  Russell [name]  has presented today for surgery, with the diagnosis of Iron deficiency anemia.  The various methods of treatment have been discussed with the patient and family. After consideration of risks, benefits and other options for treatment, the patient has consented to  Procedure(s): ESOPHAGOGASTRODUODENOSCOPY (EGD) WITH PROPOFOL (N/A) COLONOSCOPY WITH PROPOFOL (N/A) as a surgical intervention.  The patient's history has been reviewed, patient examined, no change in status, stable for surgery.  I have reviewed the patient's chart and labs.  Questions were answered to the patient's satisfaction.    Hgb 8.4 today  Nelida Meuse III

## 2022-09-28 NOTE — Progress Notes (Signed)
Mobility Specialist - Progress Note   09/28/22 1155  Mobility  Activity Ambulated with assistance in hallway  Level of [place]  Distance Ambulated (ft) 500 ft  Activity Response Tolerated well  Mobility Referral Yes  $Mobility charge 1 Mobility   Pt received in bed and agreeable to mobility. No complaints during session. Pt to recliner after session with all needs met.  [facility]

## 2022-09-28 NOTE — Anesthesia Postprocedure Evaluation (Signed)
Anesthesia Post Note  Patient: Russell [name]  Procedure(s) Performed: ESOPHAGOGASTRODUODENOSCOPY (EGD) WITH PROPOFOL COLONOSCOPY WITH PROPOFOL BIOPSY     Patient location during evaluation: PACU Anesthesia Type: MAC Level of consciousness: awake and alert Pain management: pain level controlled Vital Signs Assessment: post-procedure vital signs reviewed and stable Respiratory status: spontaneous breathing, nonlabored ventilation, respiratory function stable and patient connected to nasal cannula oxygen Cardiovascular status: stable and blood pressure returned to baseline Postop Assessment: no apparent nausea or vomiting Anesthetic complications: no  No notable events documented.  Last Vitals:  Vitals:   09/28/22 1444 09/28/22 1456  BP: 110/63 113/68  Pulse: 77 77  Resp: 18 16  Temp:    SpO2: 100% 100%    Last Pain:  Vitals:   09/28/22 1456  TempSrc:   PainSc: 0-No pain                 [name]

## 2022-09-28 NOTE — Op Note (Signed)
[facility] Patient Name: Russell [name] Procedure Date: 09/28/2022 MRN: [id number] Attending MD: Estill Cotta. Loletha Carrow , MD, [id number] Date of Birth: October 10, 1945 CSN: [id number] Age: 77 Admit Type: Inpatient Procedure:                Colonoscopy Indications:              Iron deficiency anemia                           See inpatient consult note                           Heme-negative on ED check Providers:                Mallie Mussel L. Loletha Carrow, MD, Adah Perl RN, RN,                            Cherylynn Ridges, Technician, Luane [employer] CRNA Referring MD:             [facility] Medicines:                Monitored Anesthesia Care Complications:            No immediate complications. Estimated Blood Loss:     Estimated blood loss was minimal. Procedure:                Pre-Anesthesia Assessment:                           - Prior to the procedure, a History and Physical                            was performed, and patient medications and                            allergies were reviewed. The patient's tolerance of                            previous anesthesia was also reviewed. The risks                            and benefits of the procedure and the sedation                            options and risks were discussed with the patient.                            All questions were answered, and informed consent                            was obtained. Prior Anticoagulants: The patient has                            taken Eliquis (apixaban), last dose was 2 days                            prior  to procedure. ASA Grade Assessment: III - A                            patient with severe systemic disease. After                            reviewing the risks and benefits, the patient was                            deemed in satisfactory condition to undergo the                            procedure.                           After obtaining informed consent, the colonoscope                             was passed under direct vision. Throughout the                            procedure, the patient's blood pressure, pulse, and                            oxygen saturations were monitored continuously. The                            [id number] [id number]) Olympus colonoscope was                            introduced through the anus and advanced to the the                            cecum, identified by appendiceal orifice and                            ileocecal valve. The colonoscopy was performed                            without difficulty. The patient tolerated the                            procedure well. The quality of the bowel                            preparation was good in most areas, fair in some                            with residual debris despite lavage. The ileocecal                            valve, appendiceal orifice, and rectum were  photographed. Scope In: 2:12:49 PM Scope Out: 2:30:29 PM Scope Withdrawal Time: 0 hours 12 minutes 6 seconds  Total Procedure Duration: 0 hours 17 minutes 40 seconds  Findings:      The perianal and digital rectal examinations were normal.      A non-obstructing mass was found in the proximal ascending colon. The       mass was partially circumferential (involving one-half of the lumen       circumference). No bleeding was present. Biopsies were taken with a cold       forceps for histology.      Internal hemorrhoids were found. There were also several scars at and       above the anal verge from prior hemorrhoidal banding.      The exam was otherwise without abnormality on direct and retroflexion       views. 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 Biopsied.                           - Internal hemorrhoids.                           - The examination was otherwise normal on direct                            and  retroflexion views. Moderate Sedation:      MAC sedation used Recommendation:           - Return patient to hospital ward for ongoing care.                           - Resume regular diet.                           - Perform a CT scan (computed tomography) of chest                            with contrast, abdomen with contrast and pelvis                            with contrast at appointment to be scheduled.                           -CEA level                           - Consult to general surgery                           Do not resume Eliquis yet. Was started for                            superficial venous thrombosis, and potential  surgical plan for colonic mass not yet defined. Procedure Code(s):        --- Professional ---                           [phone], Colonoscopy, flexible; with biopsy, single                            or multiple Diagnosis Code(s):        --- Professional ---                           D49.0, Neoplasm of unspecified behavior of                            digestive system                           K64.8, Other hemorrhoids                           D50.9, Iron deficiency anemia, unspecified CPT copyright 2022 [employer]. All rights reserved. The codes documented in this report are preliminary and upon coder review may  be revised to meet current compliance requirements. Tayvia Faughnan L. Loletha Carrow, MD 09/28/2022 2:40:34 PM This report has been signed electronically. Number of Addenda: 0

## 2022-09-28 NOTE — Progress Notes (Signed)
PROGRESS NOTE    Russell [name]  [id number] DOB: [birth year] DOA: 09/26/2022 PCP: [name], MD    Chief Complaint  Patient presents with   Low Hgb   Clot in leg    Brief Narrative:  HPI per Dr. 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  Patient was seen in on September 24, 2022 due to a sore spot on his right thigh.  He had a point-of-care ultrasound which diagnosed him with a superficial vein thrombosis.  He was started on Eliquis.  He was referred to the hospital for an ultrasound which she did have.  This was negative for a deep vein thrombosis.  CTPA was negative for PE.  The ultrasound did confirm that he had superficial thrombophlebitis of the mid greater saphenous vein.  He still had labs drawn at that time and his heme open came back today at 7.2 g/dL.  In February 2023, his hemoglobin then was 11.2.  Patient denies any gross melena.  He has had in the last 3 weeks no bloody bowel movements.  He denies any black or tarry sticky stools.   Patient states that he has been feeling more fatigued and tired but attributes this to his lumbar radiculopathy.  He is also under a lot of stress as his wife has terminal cancer.   Patient was seen again by his PCP and directed to the ER due to his hemoglobin.   EDP is contacted [facility] for consultation.   [facility] hospitalist contacted for admission.    ED Course: Hgb 6.8    Assessment & Plan:  Principal Problem:   Acute blood loss anemia Active Problems:   Prolapsed internal hemorrhoids, grade 2 and 3   GERD   Left lumbar radiculopathy   Neuropathic pain   Superficial vein thrombosis   History of cancer of lower lobe bronchus or lung - s/p left lower  lobectomy at [facility] 09-07-2021  Assessment and Plan: * Acute blood loss anemia Patient presented from PCPs office due to anemia.   -Patient noted to have had labs drawn which was 7.2 on 09/24/2022.  -Patient noted to have last hemoglobin of 12.7 on 05/16/2021 as noted on epic.  Patient noted to have had a hemoglobin of 7.1 prior to starting Eliquis.   -Hemoglobin repeat on presentation to the ED noted at 6.8.  -Anemia panel done consistent with a severe iron deficiency anemia with iron level of 9, ferritin of 6, TIBC of 396.  -Status post transfusion 2 units packed red blood cells with repeat hemoglobin at 7.5 today.  -FOBT negative.   -Had colonoscopy in November 2019 that removed 1 sessile polyp. -Patient noted to have intermittent dark stools and states stool was brown this morning. -Patient seen in consultation by GI who are recommending EGD and colonoscopy which was performed 3/15 -Discussed with GI. Findings are worrisome for mass in ascending colon without obstruction. GI recs for CT chest, abd/pelvis for metastatic survey. Also rec for IV iron. Ordered -GI to discuss with General Surgery -recheck cbc in AM  History of cancer of lower lobe bronchus or lung - s/p left lower lobectomy at [facility] 09-07-2021 Stable. Pt states he did not receive any chemo or radiation after lobectomy. Follows with  [facility]. Per their notes, pt has stage 1A3  -Now concern for mass in ascending colon per above  Superficial vein thrombosis -Eliquis on hold.   -No DVT or PE noted on lower extremity Dopplers on CT angiogram chest.   -discussed with GI. Given risk/benefit, recommendation to continue to hold off on anticoagulation   Neuropathic pain -Neurontin.  Left lumbar radiculopathy -Continue Neurontin 600 mg twice daily.   GERD -PPI.  Prolapsed internal hemorrhoids, grade 2 and 3 Chronic. 06/03/2018 Grade 3 left lateral and grade 2 right posterior right anterior Left lateral banded with 2  bands. 12/4 banded RP and LL (again with 2 bands) 08/01/2018 left lateral banded x2  DVT prophylaxis: SCDs Code Status: Full Family Communication: Updated patient.  No family at bedside. Disposition: Home once clinically improved, cleared by GI.  Status is: Inpatient The patient will require care spanning > 2 midnights and should be moved to inpatient because: Severity of illness   Consultants:  Gastroenterology: Dr.Danis III 09/27/2022 General Surgery  Procedures:  Transfusion 2 units packed red blood cells 09/26/2022 EGD/colon 3/15  Antimicrobials:  Anti-infectives (From admission, onward)    None         Subjective: Pt was seen prior to endoscopy. Feeling eager to have procedure performed this afternoon. Denies any recent BRBPR or dark stools  Objective: Vitals:   09/28/22 1314 09/28/22 1436 09/28/22 1444 09/28/22 1456  BP: 131/79 113/64 110/63 113/68  Pulse: 63 77 77 77  Resp: 15 18 18 16   Temp: 98.2 F (36.8 C) (!) 97.2 F (36.2 C)    TempSrc: Temporal Temporal    SpO2: 100% 100% 100% 100%  Weight:      Height:        Intake/Output Summary (Last 24 hours) at 09/28/2022 1556 Last data filed at 09/28/2022 1429 Gross per 24 hour  Intake 422.85 ml  Output --  Net 422.85 ml    Filed Weights   09/27/22 1612 09/28/22 0512  Weight: 87.1 kg 86.2 kg    Examination: General exam: Awake, laying in bed, in nad Respiratory system: Normal respiratory effort, no wheezing Cardiovascular system: regular rate, s1, s2 Gastrointestinal system: Soft, nondistended, positive BS Central nervous system: CN2-12 grossly intact, strength intact Extremities: Perfused, no clubbing Skin: Normal skin turgor, no notable skin lesions seen Psychiatry: Mood normal // no visual hallucinations   Data Reviewed:   CBC: Recent Labs  Lab 09/24/22 1118 09/26/22 1203 09/26/22 1853 09/27/22 0307 09/27/22 1715 09/28/22 0651  WBC 4.8 5.2 5.2 5.0  --  4.9  NEUTROABS 3.3 2.8  --  2.4   --  2.9  HGB 7.2* 7.1* 6.8* 7.5* 8.3* 8.4*  HCT 24.6* 24.0* 23.3* 25.1* 27.7* 28.1*  MCV 62* 61.4* 61.3* 64.4*  --  65.5*  PLT 340 316 331 283  --  296     Basic Metabolic Panel: Recent Labs  Lab 09/24/22 1118 09/26/22 1853 09/27/22 0307 09/28/22 0651  NA 136 135 137 140  K 4.0 3.8 4.2 3.7  CL 100 102 104 108  CO2 22 24 24 23   GLUCOSE 118* 101* 118* 88  BUN 13 18 18 12   CREATININE 0.72* 0.80 0.73 0.62  CALCIUM 9.0 8.4* 8.2* 8.6*  MG  --   --   --  2.3     GFR: Estimated Creatinine Clearance: 88.9 mL/min (by C-G formula based on SCr of 0.62 mg/dL).  Liver Function Tests: Recent Labs  Lab 09/26/22 1853 09/27/22 0307  AST 23 18  ALT 16 14  ALKPHOS 64 59  BILITOT 0.5 1.4*  PROT 7.2 6.3*  ALBUMIN 3.9 3.7     CBG: No results for input(s): "GLUCAP" in the last 168 hours.   No results found for this or any previous visit (from the past 240 hour(s)).       Radiology Studies: No results found.      Scheduled Meds:  sodium chloride   Intravenous Once   gabapentin  600 mg Oral BID   melatonin  5 mg Oral QHS   metoprolol succinate  25 mg Oral Daily   pantoprazole (PROTONIX) IV  40 mg Intravenous Q24H   tamsulosin  0.4 mg Oral Daily   Continuous Infusions:  ferric gluconate (FERRLECIT) IVPB       LOS: 1 day   Marylu Lund, MD [facility]   To contact the attending provider between 7A-7P or the covering provider during after hours 7P-7A, please log into the web site [email or web address] and access using universal [place] password for that web site. If you do not have the password, please call the hospital operator.  09/28/2022, 3:56 PM

## 2022-09-28 NOTE — Anesthesia Preprocedure Evaluation (Signed)
Anesthesia Evaluation  Patient identified by MRN, date of birth, ID band Patient awake    Reviewed: Allergy & Precautions, H&P , NPO status , Patient's Chart, lab work & pertinent test results  Airway Mallampati: II  TM Distance: >3 FB Neck ROM: Full    Dental no notable dental hx.    Pulmonary sleep apnea , former smoker   Pulmonary exam normal breath sounds clear to auscultation       Cardiovascular negative cardio ROS Normal cardiovascular exam Rhythm:Regular Rate:Normal     Neuro/Psych CVA  negative psych ROS   GI/Hepatic Neg liver ROS,GERD  ,,  Endo/Other  negative endocrine ROS    Renal/GU negative Renal ROS  negative genitourinary   Musculoskeletal negative musculoskeletal ROS (+)    Abdominal   Peds negative pediatric ROS (+)  Hematology  (+) Blood dyscrasia, anemia   Anesthesia Other Findings   Reproductive/Obstetrics negative OB ROS                             Anesthesia Physical Anesthesia Plan  ASA: 3  Anesthesia Plan: MAC   Post-op Pain Management: Minimal or no pain anticipated   Induction: Intravenous  PONV Risk Score and Plan: 1 and Propofol infusion and Treatment may vary due to age or medical condition  Airway Management Planned: Simple Face Mask  Additional Equipment:   Intra-op Plan:   Post-operative Plan:   Informed Consent: I have reviewed the patients History and Physical, chart, labs and discussed the procedure including the risks, benefits and alternatives for the proposed anesthesia with the patient or authorized representative who has indicated his/her understanding and acceptance.     Dental advisory given  Plan Discussed with: CRNA and Surgeon  Anesthesia Plan Comments:        Anesthesia Quick Evaluation

## 2022-09-28 NOTE — Anesthesia Procedure Notes (Signed)
Procedure Name: MAC Date/Time: 09/28/2022 2:02 PM  Performed by: Renato Shin, CRNAPre-anesthesia Checklist: Patient identified, Emergency Drugs available, Suction available and Patient being monitored Patient Re-evaluated:Patient Re-evaluated prior to induction Oxygen Delivery Method: Simple face mask Preoxygenation: Pre-oxygenation with 100% oxygen Induction Type: IV induction Airway Equipment and Method: Bite block Placement Confirmation: positive ETCO2 and breath sounds checked- equal and bilateral Dental Injury: Teeth and Oropharynx as per pre-operative assessment

## 2022-09-28 NOTE — Transfer of Care (Signed)
Immediate Anesthesia Transfer of Care Note  Patient: Russell [name]  Procedure(s) Performed: ESOPHAGOGASTRODUODENOSCOPY (EGD) WITH PROPOFOL COLONOSCOPY WITH PROPOFOL BIOPSY  Patient Location: Short Stay  Anesthesia Type:General  Level of Consciousness: awake, alert , oriented, and patient cooperative  Airway & Oxygen Therapy: Patient Spontanous Breathing and Patient connected to face mask oxygen  Post-op Assessment: Report given to RN, Post -op Vital signs reviewed and stable, and Patient moving all extremities X 4  Post vital signs: Reviewed and stable  Last Vitals:  Vitals Value Taken Time  BP    Temp    Pulse    Resp    SpO2      Last Pain:  Vitals:   09/28/22 1314  TempSrc: Temporal  PainSc: 0-No pain         Complications: No notable events documented.

## 2022-09-28 NOTE — Consult Note (Signed)
Consult Note  Russell [name] Dec 24, 1945  DO:[id number].    Requesting MD: Russell Lund, MD Chief Complaint/Reason for Consult: ascending colon mass HPI:  Patient is a 77 year old male who presented to the ED with fatigue x 3 weeks. Gradually progressive with occasional feelings of lightheadedness and weakness. He was seen by PCP recently and diagnosed with superficial thrombophlebitis of lower extremity and started on Eliquis. He had 3 doses of this and then labs resulted with a significant drop in hemoglobin and PCP directed patient to the ED. Hgb noted to be 7.2 compared to previous baseline of 12.7. He reported intermittent dark stools with constipation and does have some chronic constipation as well. No history of prior GI bleeding and last colonoscopy was in 2019 and significant for one diminutive polyp. Last EGD in 2022 with gastric polyps but no evidence of malignancy.   PMH otherwise significant for Hx of CVA, Hx of non-small cell lung cancer s/p lobectomy, hx of cauda equina syndrome s/p laminectomy, GERD, peripheral neuropathy, internal hemorrhoids, chronic anemia, and OSA. No prior abdominal surgery. NKDA.   ROS: Review of Systems  Gastrointestinal:  Positive for constipation.  Musculoskeletal:  Positive for back pain and neck pain.  Neurological:  Positive for weakness.  All other systems reviewed and are negative.   Family History  Problem Relation Age of Onset   Aortic aneurysm Mother    Varicose Veins Mother    Heart attack Father    Arthritis Father    Aortic aneurysm Sister    Early death Sister    CVA Sister    Aortic aneurysm Sister    Aortic aneurysm Sister    Colon cancer Neg Hx    Pancreatic cancer Neg Hx    Rectal cancer Neg Hx    Stomach cancer Neg Hx    Esophageal cancer Neg Hx    Colon polyps Neg Hx     Past Medical History:  Diagnosis Date   Anemia 2019   Arachnoiditis    Arthritis    Bleeding internal hemorrhoids 09/10/2006    Qualifier: Diagnosis of  By: Russell Munro MD, Russell [name]     Cancer [name] 2022   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[name]) 2011   Cerebellar infarct ([name])    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   anterior cer disc and fusion with plates C3-4 and D34-534 at [name]   Constipation    Encounter for general adult medical examination with abnormal findings 06/27/2022   Enlarged prostate    GERD (gastroesophageal reflux disease)    Hemorrhoids    History of kidney stones    passed stones   History of lumbar laminectomy 05/12/2020   open L2-L5 at [name]   Hx of colonic polyp 05/29/2018   05/2018 cecal ssp   Lung nodule 02/2021   Maxillary sinus polyp    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   Prolapsed internal hemorrhoids, grade 3 09/10/2006   Qualifier: Diagnosis of  By: Russell Munro MD, Russell [name]     Salivary gland enlargement    Sleep apnea 10 years ago   declined CPAP   Stroke ([name]) 15-20 years ago    Past Surgical History:  Procedure Laterality Date   BRONCHIAL BIOPSY  05/16/2021   Procedure: BRONCHIAL BIOPSIES;  Surgeon: Russell Nash, DO;  Location: [facility];  Service: Pulmonary;;   BRONCHIAL BRUSHINGS  05/16/2021   Procedure: BRONCHIAL BRUSHINGS;  Surgeon: Russell Nash, DO;  Location: [name];  Service: Pulmonary;;  BRONCHIAL NEEDLE ASPIRATION BIOPSY  05/16/2021   Procedure: BRONCHIAL NEEDLE ASPIRATION BIOPSIES;  Surgeon: Russell Nash, DO;  Location: [name];  Service: Pulmonary;;   CERVICAL SPINE SURGERY  04/10/2020   C3-C4 with pin at [name]   COLONOSCOPY     FIDUCIAL MARKER PLACEMENT  05/16/2021   Procedure: FIDUCIAL MARKER PLACEMENT;  Surgeon: Russell Nash, DO;  Location: [name];  Service: Pulmonary;;   HEMORRHOID BANDING     LUMBAR LAMINECTOMY  03/08/2010   L2-3, cauda equina syndrome   lumbar laminectomy  05/12/2020   [name]   SPINE SURGERY     UPPER GI ENDOSCOPY     VIDEO BRONCHOSCOPY WITH ENDOBRONCHIAL NAVIGATION Left 05/16/2021   Procedure: VIDEO BRONCHOSCOPY WITH  ENDOBRONCHIAL NAVIGATION;  Surgeon: Russell Nash, DO;  Location: [name];  Service: Pulmonary;  Laterality: Left;  ION w/ fiducial placement   VIDEO BRONCHOSCOPY WITH RADIAL ENDOBRONCHIAL ULTRASOUND  05/16/2021   Procedure: VIDEO BRONCHOSCOPY WITH RADIAL ENDOBRONCHIAL ULTRASOUND;  Surgeon: Russell Nash, DO;  Location: [name];  Service: Pulmonary;;   wisdom teeth ext      Social History:  reports that he quit smoking about 47 years ago. His smoking use included cigarettes. He started smoking about 56 years ago. He has a 2.25 pack-year smoking history. He has been exposed to tobacco smoke. He has never used smokeless tobacco. He reports that he does not currently use alcohol. He reports that he does not use drugs.  Allergies: No Known Allergies  Medications Prior to Admission  Medication Sig Dispense Refill   APIXABAN (ELIQUIS) VTE STARTER PACK (10MG  AND 5MG ) Take as directed on package: start with two-5mg  tablets twice daily for 7 days. On day 8, switch to one-5mg  tablet twice daily. 1 each 0   ASHWAGANDHA PO Take 450 mg by mouth in the morning and at bedtime.     aspirin 81 MG EC tablet Take 81 mg by mouth every evening.     diclofenac Sodium (VOLTAREN) 1 % GEL Apply 2 g topically daily as needed (pain).     finasteride (PROSCAR) 5 MG tablet Take 1 tablet (5 mg total) by mouth daily. 90 tablet 3   gabapentin (NEURONTIN) 300 MG capsule Take 1 capsule (300 mg total) by mouth 3 (three) times daily. (Actually 300 mg in AM and 600 mg QHS) x 1 week, then 600 mg BID- for nerve pain (Patient taking differently: Take 600 mg by mouth 2 (two) times daily.) 360 capsule 1   Magnesium 400 MG CAPS Take 400 mg by mouth 2 (two) times daily. asporotate     Melatonin 5 MG/ML LIQD Take 5 mg by mouth daily.     metoprolol succinate (TOPROL XL) 25 MG 24 hr tablet Take 1 tablet (25 mg total) by mouth daily. 90 tablet 3   metroNIDAZOLE (METROGEL) 0.75 % gel Apply to face daily for rosacea 45 g 4    Omega-3 Fatty Acids (FISH OIL) 1200 MG CAPS Take 1,200 mg by mouth daily.     omeprazole (PRILOSEC) 40 MG capsule TAKE (1) CAPSULE BY MOUTH ONCE DAILY. 90 capsule 3   polyethylene glycol powder (GLYCOLAX/MIRALAX) 17 GM/SCOOP powder Take 0.5 Containers by mouth daily as needed for moderate constipation or mild constipation.     Probiotic Product (PROBIOTIC DAILY PO) Take 1 tablet by mouth daily.     senna-docusate (SENOKOT-S) 8.6-50 MG tablet Take 2 tablets by mouth at bedtime as needed for mild constipation.     silodosin (RAPAFLO) 8  MG CAPS capsule Take 1 capsule (8 mg total) by mouth 2 (two) times daily. 30 capsule 11   TURMERIC PO Take 750 mg by mouth daily. curamed curcumin     cyclobenzaprine (FLEXERIL) 10 MG tablet Take 1 tablet (10 mg total) by mouth at bedtime. (Patient not taking: Reported on 09/27/2022) 30 tablet 5   hydrocortisone (ANUSOL-HC) 2.5 % rectal cream Place 1 application. rectally 2 (two) times daily. (Patient not taking: Reported on 09/27/2022) 30 g 1    Blood pressure 113/68, pulse 77, temperature (!) 97.2 F (36.2 C), temperature source Temporal, resp. rate 16, height 6' (1.829 m), weight 86.2 kg, SpO2 100 %. Physical Exam:  General: pleasant, WD,  male who is laying in bed in NAD HEENT: head is normocephalic, atraumatic.  Sclera are noninjected.  PERRL.  Ears and nose without any masses or lesions.  Mouth is pink and moist Heart: regular, rate, and rhythm.  Normal s1,s2. No obvious murmurs, gallops, or rubs noted.  Palpable radial and pedal pulses bilaterally Lungs: CTAB, no wheezes, rhonchi, or rales noted.  Respiratory effort nonlabored Abd: soft, NT, ND, +BS, no masses, hernias, or organomegaly MS: all 4 extremities are symmetrical with no cyanosis, clubbing, or edema.  Small cord noted in her right thigh Skin: warm and dry with no masses, lesions, or rashes Neuro: Cranial nerves 2-12 grossly intact, sensation is normal throughout Psych: A&Ox3 with an appropriate  affect.   Results for orders placed or performed during the hospital encounter of 09/26/22 (from the past 48 hour(s))  Comprehensive metabolic panel     Status: Abnormal   Collection Time: 09/26/22  6:53 PM  Result Value Ref Range   Sodium 135 135 - 145 mmol/L   Potassium 3.8 3.5 - 5.1 mmol/L   Chloride 102 98 - 111 mmol/L   CO2 24 22 - 32 mmol/L   Glucose, Bld 101 (H) 70 - 99 mg/dL    Comment: Glucose reference range applies only to samples taken after fasting for at least 8 hours.   BUN 18 8 - 23 mg/dL   Creatinine, Ser 0.80 0.61 - 1.24 mg/dL   Calcium 8.4 (L) 8.9 - 10.3 mg/dL   Total Protein 7.2 6.5 - 8.1 g/dL   Albumin 3.9 3.5 - 5.0 g/dL   AST 23 15 - 41 U/L   ALT 16 0 - 44 U/L   Alkaline Phosphatase 64 38 - 126 U/L   Total Bilirubin 0.5 0.3 - 1.2 mg/dL   GFR, Estimated >60 >60 mL/min    Comment: (NOTE) Calculated using the CKD-EPI Creatinine Equation (2021)    Anion gap 9 5 - 15    Comment: Performed at [facility], [place] [address], [place], [place] [address]  CBC     Status: Abnormal   Collection Time: 09/26/22  6:53 PM  Result Value Ref Range   WBC 5.2 4.0 - 10.5 K/uL   RBC 3.80 (L) 4.22 - 5.81 MIL/uL   Hemoglobin 6.8 (LL) 13.0 - 17.0 g/dL    Comment: REPEATED TO VERIFY Reticulocyte Hemoglobin testing may be clinically indicated, consider ordering this additional test [id number] THIS CRITICAL RESULT HAS VERIFIED AND BEEN CALLED TO J.LYNCH, RN BY NATHAN THOMPSON ON 03 13 2024 AT 1941, AND HAS BEEN READ BACK. CRITICAL RESULT VERIFIED    HCT 23.3 (L) 39.0 - 52.0 %   MCV 61.3 (L) 80.0 - 100.0 fL   MCH 17.9 (L) 26.0 - 34.0 pg   MCHC 29.2 (L) 30.0 -  36.0 g/dL   RDW 20.1 (H) 11.5 - 15.5 %   Platelets 331 150 - 400 K/uL   nRBC 0.0 0.0 - 0.2 %    Comment: Performed at [facility] [place] [address], [place], [place] [address]  Type and screen [place]     Status: None   Collection Time: 09/26/22  6:53 PM  Result  Value Ref Range   ABO/RH(D) A POS    Antibody Screen NEG    Sample Expiration 09/29/2022,2359    Unit Number [id number]    Blood Component Type RED CELLS,LR    Unit division 00    Status of Unit ISSUED,FINAL    Transfusion Status OK TO TRANSFUSE    Crossmatch Result Compatible    Unit Number [id number]    Blood Component Type RED CELLS,LR    Unit division 00    Status of Unit ISSUED,FINAL    Transfusion Status OK TO TRANSFUSE    Crossmatch Result      Compatible Performed at [facility], [place] [address] [place], [place] [address]   Protime-INR - (order if Patient is taking Coumadin / Warfarin)     Status: Abnormal   Collection Time: 09/26/22  6:53 PM  Result Value Ref Range   Prothrombin Time 16.1 (H) 11.4 - 15.2 seconds   INR 1.3 (H) 0.8 - 1.2    Comment: (NOTE) INR goal varies based on device and disease states. Performed at [facility], [place] [address], [place], [place] [address]   ABO/Rh     Status: None   Collection Time: 09/26/22  8:05 PM  Result Value Ref Range   ABO/RH(D)      A POS Performed at [facility], [place] [address] [place], [place] [address]   Prepare RBC (crossmatch)     Status: None   Collection Time: 09/26/22  9:00 PM  Result Value Ref Range   Order Confirmation      ORDER PROCESSED BY BLOOD BANK Performed at [facility], [place] [address], [place], [place] [address]   POC occult blood, ED     Status: None   Collection Time: 09/26/22  9:35 PM  Result Value Ref Range   Fecal Occult Bld NEGATIVE NEGATIVE  Comprehensive metabolic panel     Status: Abnormal   Collection Time: 09/27/22  3:07 AM  Result Value Ref Range   Sodium 137 135 - 145 mmol/L   Potassium 4.2 3.5 - 5.1 mmol/L   Chloride 104 98 - 111 mmol/L   CO2 24 22 - 32 mmol/L   Glucose, Bld 118 (H) 70 - 99 mg/dL    Comment: Glucose reference range applies only to samples taken after fasting for at least 8  hours.   BUN 18 8 - 23 mg/dL   Creatinine, Ser 0.73 0.61 - 1.24 mg/dL   Calcium 8.2 (L) 8.9 - 10.3 mg/dL   Total Protein 6.3 (L) 6.5 - 8.1 g/dL   Albumin 3.7 3.5 - 5.0 g/dL   AST 18 15 - 41 U/L   ALT 14 0 - 44 U/L   Alkaline Phosphatase 59 38 - 126 U/L   Total Bilirubin 1.4 (H) 0.3 - 1.2 mg/dL   GFR, Estimated >60 >60 mL/min    Comment: (NOTE) Calculated using the CKD-EPI Creatinine Equation (2021)    Anion gap 9 5 - 15    Comment: Performed at [facility], [place] [address] [place],  [place] [address]  CBC with Differential/Platelet     Status: Abnormal   Collection Time: 09/27/22  3:07 AM  Result Value Ref Range   WBC 5.0 4.0 - 10.5 K/uL   RBC 3.90 (L) 4.22 - 5.81 MIL/uL   Hemoglobin 7.5 (L) 13.0 - 17.0 g/dL    Comment: Reticulocyte Hemoglobin testing may be clinically indicated, consider ordering this additional test [id number]    HCT 25.1 (L) 39.0 - 52.0 %   MCV 64.4 (L) 80.0 - 100.0 fL   MCH 19.2 (L) 26.0 - 34.0 pg   MCHC 29.9 (L) 30.0 - 36.0 g/dL   RDW 23.1 (H) 11.5 - 15.5 %   Platelets 283 150 - 400 K/uL   nRBC 0.4 (H) 0.0 - 0.2 %   Neutrophils Relative % 47 %   Neutro Abs 2.4 1.7 - 7.7 K/uL   Lymphocytes Relative 38 %   Lymphs Abs 1.9 0.7 - 4.0 K/uL   Monocytes Relative 11 %   Monocytes Absolute 0.5 0.1 - 1.0 K/uL   Eosinophils Relative 3 %   Eosinophils Absolute 0.1 0.0 - 0.5 K/uL   Basophils Relative 1 %   Basophils Absolute 0.0 0.0 - 0.1 K/uL   Immature Granulocytes 0 %   Abs Immature Granulocytes 0.01 0.00 - 0.07 K/uL   Dimorphism PRESENT    Target Cells PRESENT    Ovalocytes PRESENT     Comment: Performed at [facility], [place] [address], [place], [place] [address]  Hemoglobin and hematocrit, blood     Status: Abnormal   Collection Time: 09/27/22  5:15 PM  Result Value Ref Range   Hemoglobin 8.3 (L) 13.0 - 17.0 g/dL   HCT 27.7 (L) 39.0 - 52.0 %    Comment: Performed at [facility], [place] [address], [place], [place] [address]  CBC with Differential/Platelet     Status: Abnormal   Collection Time: 09/28/22  6:51 AM  Result Value Ref Range   WBC 4.9 4.0 - 10.5 K/uL   RBC 4.29 4.22 - 5.81 MIL/uL   Hemoglobin 8.4 (L) 13.0 - 17.0 g/dL    Comment: Reticulocyte Hemoglobin testing may be clinically indicated, consider ordering this additional test [id number]    HCT 28.1 (L) 39.0 - 52.0 %   MCV 65.5 (L) 80.0 - 100.0 fL   MCH 19.6 (L) 26.0 - 34.0 pg   MCHC 29.9 (L) 30.0 - 36.0 g/dL   RDW 24.1 (H) 11.5 - 15.5 %   Platelets 296 150 - 400 K/uL   nRBC 0.0 0.0 - 0.2 %   Neutrophils Relative % 59 %   Neutro Abs 2.9 1.7 - 7.7 K/uL   Lymphocytes Relative 27 %   Lymphs Abs 1.3 0.7 - 4.0 K/uL   Monocytes Relative 11 %   Monocytes Absolute 0.5 0.1 - 1.0 K/uL   Eosinophils Relative 2 %   Eosinophils Absolute 0.1 0.0 - 0.5 K/uL   Basophils Relative 1 %   Basophils Absolute 0.0 0.0 - 0.1 K/uL   Immature Granulocytes 0 %   Abs Immature Granulocytes 0.01 0.00 - 0.07 K/uL    Comment: Performed at [facility], [place] [address], [place], [place] [id number]  Basic metabolic panel     Status: Abnormal   Collection Time: 09/28/22  6:51 AM  Result Value Ref Range   Sodium 140 135 - 145 mmol/L   Potassium 3.7 3.5 - 5.1 mmol/L   Chloride 108 98 - 111 mmol/L  CO2 23 22 - 32 mmol/L   Glucose, Bld 88 70 - 99 mg/dL    Comment: Glucose reference range applies only to samples taken after fasting for at least 8 hours.   BUN 12 8 - 23 mg/dL   Creatinine, Ser 0.62 0.61 - 1.24 mg/dL   Calcium 8.6 (L) 8.9 - 10.3 mg/dL   GFR, Estimated >60 >60 mL/min    Comment: (NOTE) Calculated using the CKD-EPI Creatinine Equation (2021)    Anion gap 9 5 - 15    Comment: Performed at [facility], [place] [address], [place], [place] [address]  Magnesium     Status: None   Collection Time: 09/28/22  6:51 AM  Result Value Ref Range   Magnesium 2.3 1.7 - 2.4 mg/dL    Comment: Performed at  [facility], [place] [address] [place], [place] [address]   No results found.    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 [name].  Clear liquids for now.  May need gentle prep with MiraLAX antibiotics tomorrow.  Discussed surgical resection, techniques, complications, long-term expectations and expected recovery. I reviewed Consultant GI notes, hospitalist notes, last 24 h vitals and pain scores, last 48 h intake and output, last 24 h labs and trends, and last 24 h imaging results.  This care required moderate level of medical decision making.    [facility] 09/28/2022, 3:32 PM Please see Amion for pager number during day hours 7:00am-4:30pm

## 2022-09-29 ENCOUNTER — Inpatient Hospital Stay (HOSPITAL_COMMUNITY): Payer: Medicare PPO

## 2022-09-29 DIAGNOSIS — M792 Neuralgia and neuritis, unspecified: Secondary | ICD-10-CM | POA: Diagnosis not present

## 2022-09-29 DIAGNOSIS — K6389 Other specified diseases of intestine: Secondary | ICD-10-CM | POA: Diagnosis present

## 2022-09-29 DIAGNOSIS — D509 Iron deficiency anemia, unspecified: Secondary | ICD-10-CM

## 2022-09-29 DIAGNOSIS — Z85118 Personal history of other malignant neoplasm of bronchus and lung: Secondary | ICD-10-CM | POA: Diagnosis not present

## 2022-09-29 DIAGNOSIS — K219 Gastro-esophageal reflux disease without esophagitis: Secondary | ICD-10-CM | POA: Diagnosis not present

## 2022-09-29 HISTORY — DX: Other specified diseases of intestine: K63.89

## 2022-09-29 LAB — CBC
HCT: 26.3 % — ABNORMAL LOW (ref 39.0–52.0)
Hemoglobin: 7.9 g/dL — ABNORMAL LOW (ref 13.0–17.0)
MCH: 19.7 pg — ABNORMAL LOW (ref 26.0–34.0)
MCHC: 30 g/dL (ref 30.0–36.0)
MCV: 65.6 fL — ABNORMAL LOW (ref 80.0–100.0)
Platelets: 267 10*3/uL (ref 150–400)
RBC: 4.01 MIL/uL — ABNORMAL LOW (ref 4.22–5.81)
RDW: 24.5 % — ABNORMAL HIGH (ref 11.5–15.5)
WBC: 5.8 10*3/uL (ref 4.0–10.5)
nRBC: 0 % (ref 0.0–0.2)

## 2022-09-29 LAB — COMPREHENSIVE METABOLIC PANEL
ALT: 13 U/L (ref 0–44)
AST: 16 U/L (ref 15–41)
Albumin: 3.6 g/dL (ref 3.5–5.0)
Alkaline Phosphatase: 56 U/L (ref 38–126)
Anion gap: 6 (ref 5–15)
BUN: 13 mg/dL (ref 8–23)
CO2: 24 mmol/L (ref 22–32)
Calcium: 8.5 mg/dL — ABNORMAL LOW (ref 8.9–10.3)
Chloride: 104 mmol/L (ref 98–111)
Creatinine, Ser: 0.67 mg/dL (ref 0.61–1.24)
GFR, Estimated: >60 mL/min (ref >60)
Glucose, Bld: 90 mg/dL (ref 70–99)
Potassium: 3.5 mmol/L (ref 3.5–5.1)
Sodium: 134 mmol/L — ABNORMAL LOW (ref 135–145)
Total Bilirubin: 0.8 mg/dL (ref 0.3–1.2)
Total Protein: 6.3 g/dL — ABNORMAL LOW (ref 6.5–8.1)

## 2022-09-29 MED ORDER — IOHEXOL 300 MG/ML  SOLN
100.0000 mL | Freq: Once | INTRAMUSCULAR | Status: AC | PRN
  Administered 2022-09-29: 100 mL via INTRAVENOUS

## 2022-09-29 MED ORDER — IOHEXOL 9 MG/ML PO SOLN
500.0000 mL | ORAL | Status: AC
  Administered 2022-09-29 (×2): 500 mL via ORAL

## 2022-09-29 MED ORDER — POTASSIUM CHLORIDE CRYS ER 10 MEQ PO TBCR
20.0000 meq | EXTENDED_RELEASE_TABLET | Freq: Once | ORAL | Status: AC
  Administered 2022-09-29: 20 meq via ORAL
  Filled 2022-09-29: qty 2

## 2022-09-29 NOTE — Progress Notes (Signed)
Mobility Specialist - Progress Note   09/29/22 1326  Mobility  Activity Ambulated with assistance in hallway  Level of Assistance Modified independent, requires aide device or extra time  Assistive Device Cane  Distance Ambulated (ft) 1500 ft  Activity Response Tolerated well  Mobility Referral Yes  $Mobility charge 1 Mobility   Pt received in bed and agreeable to mobility. No complaints during session. Pt to bed after session with all needs met.    [facility]

## 2022-09-29 NOTE — Progress Notes (Signed)
PROGRESS NOTE    Russell [name]  [id number] DOB: 06-Jul-1946 DOA: 09/26/2022 PCP: [name], MD    Chief Complaint  Patient presents with   Low Hgb   Clot in leg    Brief Narrative:  HPI per Dr. 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  Patient was seen in on September 24, 2022 due to a sore spot on his right thigh.  He had a point-of-care ultrasound which diagnosed him with a superficial vein thrombosis.  He was started on Eliquis.  He was referred to the hospital for an ultrasound which she did have.  This was negative for a deep vein thrombosis.  CTPA was negative for PE.  The ultrasound did confirm that he had superficial thrombophlebitis of the mid greater saphenous vein.  He still had labs drawn at that time and his heme open came back today at 7.2 g/dL.  In February 2023, his hemoglobin then was 11.2.  Patient denies any gross melena.  He has had in the last 3 weeks no bloody bowel movements.  He denies any black or tarry sticky stools.   Patient states that he has been feeling more fatigued and tired but attributes this to his lumbar radiculopathy.  He is also under a lot of stress as his wife has terminal cancer.   Patient was seen again by his PCP and directed to the ER due to his hemoglobin.   EDP is contacted [facility] for consultation.   [facility] hospitalist contacted for admission.    ED Course: Hgb 6.8    Assessment & Plan:  Principal Problem:   Acute blood loss anemia Active Problems:   Colonic mass   Prolapsed internal hemorrhoids, grade 2 and 3   GERD   Left lumbar radiculopathy   Neuropathic pain   Superficial vein thrombosis   History of cancer of lower lobe bronchus or lung - s/p  left lower lobectomy at [facility] 09-07-2021   IDA (iron deficiency anemia)    Assessment and Plan: * Acute blood loss anemia Patient presented from PCPs office due to anemia.   -Patient noted to have had labs drawn which was 7.2 on 09/24/2022.   -Patient noted to have last hemoglobin of 12.7 on 05/16/2021 as noted on epic.  Patient noted to have had a hemoglobin of 7.1 prior to starting Eliquis.   -Hemoglobin repeat on presentation to the ED noted at 6.8.  -Anemia panel done consistent with a severe iron deficiency anemia with iron level of 9, ferritin of 6, TIBC of 396.   -Status post transfusion 2 units packed red blood cells with repeat hemoglobin stable at 7.9 today.  -FOBT negative.   -Had colonoscopy in November 2019 that removed 1 sessile polyp. -Patient noted to have intermittent dark stools and states stool was brown this morning. -Patient seen in consultation by GI and patient underwent EGD/colonoscopy 09/28/2022.   -EGD unremarkable.   -Colonoscopy concerning for ascending nonobstructing colonic mass in the proximal ascending colon without any visible active bleeding, pathology pending. -CT staging scans pending. -CEA pending. -Patient receiving IV iron. -Continue PPI. -Follow H&H. -Transfusion threshold hemoglobin < 7. -GI following. -General surgery consulted..  Colonic mass -Noted on colonoscopy 09/28/2022 that patient with nonobstructing mass  in the proximal ascending colon. -Noted on workup for severe iron deficiency anemia. -CT chest abdomen and pelvis pending for staging. -CEA pending. -Patient seen in consultation by general surgery who discussed laparoscopic/robotic partial colectomy on Monday with Dr. [name]. -Patient seen by Dr. [name] today who discussed surgery with patient in more detail and has given patient the option to schedule elective surgery in the next few weeks versus surgery this Monday. -It is noted per Dr. Manon Hilding note that patient prefers to schedule  later if possible. -Staging CTs pending, patient to complete iron infusions today with repeat hemoglobin tomorrow. -Per general surgery if everything is stable tomorrow patient may be able to go home and return for surgery electively if not the plan is to go to the OR on Monday, 10/01/2022. -Patient currently on clear liquids. -Appreciate general surgery input and recommendations.  History of cancer of lower lobe bronchus or lung - s/p left lower lobectomy at [facility] 09-07-2021 Stable. Pt states he did not receive any chemo or radiation after lobectomy. Follows with [facility]. Per their notes, pt has stage 1A3  -Outpatient follow-up.  Superficial vein thrombosis -Eliquis on hold.   -No DVT or PE noted on lower extremity Dopplers on CT angiogram chest.   -Eliquis continued to be held pending final recommendations by general surgery who have assessed the patient and do not feel patient needs any anticoagulation at this time since he only has a superficial vein thrombosis.   Neuropathic pain -Neurontin.  Left lumbar radiculopathy -Continue Neurontin 600 mg twice daily.   GERD -Continue PPI.  Prolapsed internal hemorrhoids, grade 2 and 3 Chronic. 06/03/2018 Grade 3 left lateral and grade 2 right posterior right anterior Left lateral banded with 2 bands. 12/4 banded RP and LL (again with 2 bands) 08/01/2018 left lateral banded x2 -Colonoscopy done noted some internal hemorrhoids. -Follow  IDA (iron deficiency anemia) -See acute blood loss anemia as noted above.         DVT prophylaxis: SCDs Code Status: Full Family Communication: Updated patient.  No family at bedside. Disposition: Home once hemoglobin stabilized, cleared by general surgery.    Status is: Inpatient The patient will require care spanning > 2 midnights and should be moved to inpatient because: Severity of illness   Consultants:  Gastroenterology: Dr.Danis III 09/27/2022 General surgery: Dr. Brantley Stage  09/28/2022  Procedures:  Transfusion 2 units packed red blood cells 09/26/2022 EGD 09/28/2022 per Dr. Loletha Carrow III Colonoscopy 09/28/2022 per Dr Loletha Carrow III  Antimicrobials:  Anti-infectives (From admission, onward)    None         Subjective: Patient sitting up in bed.  General surgeon at bedside.  Patient denies any chest pain.  No shortness of breath.  Feels overall generalized weakness and fatigue has improved since being hospitalized.  Currently getting IV iron.  Contemplating with general surgery as to whether to go ahead with surgical resection on Monday or to wait and do it in the outpatient setting.  Patient denies any overt bleeding.   Objective: Vitals:   09/29/22 0511 09/29/22 0511 09/29/22 1223 09/29/22 1345  BP:  112/67 123/78 135/73  Pulse:  66 (!) 52 (!) 50  Resp:  17    Temp:  97.8 F (36.6 C)  (!) 97.4 F (36.3 C)  TempSrc:  Oral  Oral  SpO2:  99%  100%  Weight: 85.8 kg     Height:        Intake/Output Summary (Last 24 hours) at 09/29/2022 [id number] Last data  filed at 09/29/2022 1500 Gross per 24 hour  Intake 900 ml  Output --  Net 900 ml   Filed Weights   09/27/22 1612 09/28/22 0512 09/29/22 0511  Weight: 87.1 kg 86.2 kg 85.8 kg    Examination:  General exam: Appears calm and comfortable.  Pallor. Respiratory system: Lungs clear to auscultation bilaterally.  No wheezes, no crackles, no rhonchi.  Fair air movement.  Speaking in full sentences.   Cardiovascular system: Regular rate rhythm no murmurs rubs or gallops.  No JVD.  No lower extremity edema.   Gastrointestinal system: Abdomen is soft, nontender, nondistended, positive bowel sounds.  No rebound.  No guarding.  Central nervous system: Alert and oriented. No focal neurological deficits. Extremities: Symmetric 5 x 5 power. Skin: No rashes, lesions or ulcers Psychiatry: Judgement and insight appear normal. Mood & affect appropriate.     Data Reviewed:   CBC: Recent Labs  Lab 09/24/22 1118  09/26/22 1203 09/26/22 1203 09/26/22 1853 09/27/22 0307 09/27/22 1715 09/28/22 0651 09/29/22 0558  WBC 4.8 5.2  --  5.2 5.0  --  4.9 5.8  NEUTROABS 3.3 2.8  --   --  2.4  --  2.9  --   HGB 7.2* 7.1*   < > 6.8* 7.5* 8.3* 8.4* 7.9*  HCT 24.6* 24.0*   < > 23.3* 25.1* 27.7* 28.1* 26.3*  MCV 62* 61.4*  --  61.3* 64.4*  --  65.5* 65.6*  PLT 340 316  --  331 283  --  296 267   < > = values in this interval not displayed.    Basic Metabolic Panel: Recent Labs  Lab 09/24/22 1118 09/26/22 1853 09/27/22 0307 09/28/22 0651 09/29/22 0558  NA 136 135 137 140 134*  K 4.0 3.8 4.2 3.7 3.5  CL 100 102 104 108 104  CO2 22 24 24 23 24   GLUCOSE 118* 101* 118* 88 90  BUN 13 18 18 12 13   CREATININE 0.72* 0.80 0.73 0.62 0.67  CALCIUM 9.0 8.4* 8.2* 8.6* 8.5*  MG  --   --   --  2.3  --     GFR: Estimated Creatinine Clearance: 88.9 mL/min (by C-G formula based on SCr of 0.67 mg/dL).  Liver Function Tests: Recent Labs  Lab 09/26/22 1853 09/27/22 0307 09/29/22 0558  AST 23 18 16   ALT 16 14 13   ALKPHOS 64 59 56  BILITOT 0.5 1.4* 0.8  PROT 7.2 6.3* 6.3*  ALBUMIN 3.9 3.7 3.6    CBG: No results for input(s): "GLUCAP" in the last 168 hours.   No results found for this or any previous visit (from the past 240 hour(s)).       Radiology Studies: No results found.      Scheduled Meds:  sodium chloride   Intravenous Once   gabapentin  600 mg Oral BID   melatonin  5 mg Oral QHS   metoprolol succinate  25 mg Oral Daily   pantoprazole (PROTONIX) IV  40 mg Intravenous Q24H   tamsulosin  0.4 mg Oral Daily   Continuous Infusions:     LOS: 2 days    Time spent: 35 minutes    Irine Seal, MD [facility]   To contact the attending provider between 7A-7P or the covering provider during after hours 7P-7A, please log into the web site [email or web address] and access using universal [place] password for that web site. If you do not have the password, please call the  hospital operator.  09/29/2022,  4:02 PM

## 2022-09-29 NOTE — Assessment & Plan Note (Signed)
-  See acute blood loss anemia as noted above.

## 2022-09-29 NOTE — Progress Notes (Signed)
Discussed surgery with pt in more detail.  Hgb has been relatively stable since transfusion.  Getting IV Iron and I see no reason he needs anticoagulation again since he has a superficial blood clot only.  In my experience it can be difficult to recover from surgery in the face of acute anemia.  I have given the patient the option to schedule elective surgery in the next few weeks vs surgery this Mon.  Pt prefers to schedule later if possible.  We will get staging CT's and complete his iron infusions today and recheck Hgb tomorrow.  If everything is stable, pt would be able to go home and return for surgery electively.  If not, will plan for OR Mon.  Rosario Adie, MD  [facility]

## 2022-09-29 NOTE — Assessment & Plan Note (Addendum)
-  Noted on colonoscopy 09/28/2022 that patient with nonobstructing mass in the proximal ascending colon. -Noted on workup for severe iron deficiency anemia. -CT chest abdomen and pelvis negative for lymphadenopathy or metastatic disease.  -CEA pending at time of discharge. -Patient seen in consultation by general surgery who discussed laparoscopic/robotic partial colectomy on Monday with Dr. Maisie Fus. -Patient seen by Dr. Maisie Fus today who discussed surgery with patient in more detail and has given patient the option to schedule elective surgery in the next few weeks versus surgery this Monday. -It is noted per Dr. Maurine Minister note that patient prefers to schedule later if possible. -Staging CTs done were negative for metastatic disease or lymphadenopathy.   -Patient received IV iron during the hospitalization, hemoglobin stabilized and was 8.6 by day of discharge.   -General surgery assessed patient on day of discharge and as hemoglobin remains, patient improved clinically it was felt patient could be discharged safely with outpatient follow-up to schedule elective colectomy.   -Patient will be discharged in stable improved condition with outpatient follow-up with general surgery.

## 2022-09-30 DIAGNOSIS — D500 Iron deficiency anemia secondary to blood loss (chronic): Secondary | ICD-10-CM

## 2022-09-30 DIAGNOSIS — K6389 Other specified diseases of intestine: Secondary | ICD-10-CM | POA: Diagnosis not present

## 2022-09-30 DIAGNOSIS — K219 Gastro-esophageal reflux disease without esophagitis: Secondary | ICD-10-CM | POA: Diagnosis not present

## 2022-09-30 DIAGNOSIS — M792 Neuralgia and neuritis, unspecified: Secondary | ICD-10-CM | POA: Diagnosis not present

## 2022-09-30 LAB — BASIC METABOLIC PANEL
Anion gap: 10 (ref 5–15)
BUN: 9 mg/dL (ref 8–23)
CO2: 22 mmol/L (ref 22–32)
Calcium: 8.7 mg/dL — ABNORMAL LOW (ref 8.9–10.3)
Chloride: 105 mmol/L (ref 98–111)
Creatinine, Ser: 0.71 mg/dL (ref 0.61–1.24)
GFR, Estimated: >60 mL/min (ref >60)
Glucose, Bld: 84 mg/dL (ref 70–99)
Potassium: 3.9 mmol/L (ref 3.5–5.1)
Sodium: 137 mmol/L (ref 135–145)

## 2022-09-30 LAB — CBC WITH DIFFERENTIAL/PLATELET
Abs Immature Granulocytes: 0.01 10*3/uL (ref 0.00–0.07)
Basophils Absolute: 0 10*3/uL (ref 0.0–0.1)
Basophils Relative: 0 %
Eosinophils Absolute: 0.2 10*3/uL (ref 0.0–0.5)
Eosinophils Relative: 3 %
HCT: 28.7 % — ABNORMAL LOW (ref 39.0–52.0)
Hemoglobin: 8.6 g/dL — ABNORMAL LOW (ref 13.0–17.0)
Immature Granulocytes: 0 %
Lymphocytes Relative: 29 %
Lymphs Abs: 1.4 10*3/uL (ref 0.7–4.0)
MCH: 19.5 pg — ABNORMAL LOW (ref 26.0–34.0)
MCHC: 30 g/dL (ref 30.0–36.0)
MCV: 64.9 fL — ABNORMAL LOW (ref 80.0–100.0)
Monocytes Absolute: 0.6 10*3/uL (ref 0.1–1.0)
Monocytes Relative: 12 %
Neutro Abs: 2.6 10*3/uL (ref 1.7–7.7)
Neutrophils Relative %: 56 %
Platelets: 306 10*3/uL (ref 150–400)
RBC: 4.42 MIL/uL (ref 4.22–5.81)
RDW: 25.5 % — ABNORMAL HIGH (ref 11.5–15.5)
WBC: 4.7 10*3/uL (ref 4.0–10.5)
nRBC: 0.4 % — ABNORMAL HIGH (ref 0.0–0.2)

## 2022-09-30 LAB — MAGNESIUM: Magnesium: 2.3 mg/dL (ref 1.7–2.4)

## 2022-09-30 LAB — CEA: CEA: 2.1 ng/mL (ref 0.0–4.7)

## 2022-09-30 MED ORDER — POLYSACCHARIDE IRON COMPLEX 150 MG PO CAPS
150.0000 mg | ORAL_CAPSULE | Freq: Every day | ORAL | Status: DC
  Administered 2022-09-30: 150 mg via ORAL
  Filled 2022-09-30: qty 1

## 2022-09-30 MED ORDER — ASPIRIN 81 MG PO TBEC: 81.0000 mg | DELAYED_RELEASE_TABLET | Freq: Every evening | ORAL | 12 refills | Status: AC

## 2022-09-30 MED ORDER — POLYSACCHARIDE IRON COMPLEX 150 MG PO CAPS: 150.0000 mg | ORAL_CAPSULE | Freq: Every day | ORAL | 1 refills | Status: AC

## 2022-09-30 NOTE — Discharge Summary (Signed)
Physician Discharge Summary  Russell [name] [id number] DOB: October 09, 1945 DOA: 09/26/2022  PCP: Russell Spar, MD  Admit date: 09/26/2022 Discharge date: 09/30/2022  Time spent: 60 minutes  Recommendations for Outpatient Follow-up:  Follow-up with Russell Spar, MD in 1 to 2 weeks.  On follow-up patient will need a CBC done to follow-up on counts.  Patient will need a basic metabolic profile done to follow-up on electrolytes and renal function.  Eliquis was discontinued during this hospitalization will defer to PCP for further management of superficial vein thrombosis. Follow-up with Dr. Leighton [name], general surgery for further management of colonic mass.  Office will call with appointment time. Follow-up with Dr. Silvano [name], gastroenterology in 3 to 4 weeks.  Office will call with follow-up on biopsy results.   Discharge Diagnoses:  Principal Problem:   Acute blood loss anemia Active Problems:   Colonic mass   Prolapsed internal hemorrhoids, grade 2 and 3   GERD   Left lumbar radiculopathy   Neuropathic pain   Superficial vein thrombosis   History of cancer of lower lobe bronchus or lung - s/p left lower lobectomy at [facility] 09-07-2021   IDA (iron deficiency anemia)   Discharge Condition: Stable and improved.  Diet recommendation: Regular  Filed Weights   09/28/22 0512 09/29/22 0511 09/30/22 0500  Weight: 86.2 kg 85.8 kg 85.8 kg    History of present illness:  HPI per Dr. 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  Patient was seen in on September 24, 2022 due to a sore spot on his right thigh.  He had a point-of-care ultrasound which diagnosed him with a superficial vein thrombosis.  He was started on  Eliquis.  He was referred to the hospital for an ultrasound which she did have.  This was negative for a deep vein thrombosis.  CTPA was negative for PE.  The ultrasound did confirm that he had superficial thrombophlebitis of the mid greater saphenous vein.  He still had labs drawn at that time and his heme open came back today at 7.2 g/dL.  In February 2023, his hemoglobin then was 11.2.  Patient denies any gross melena.  He has had in the last 3 weeks no bloody bowel movements.  He denies any black or tarry sticky stools.   Patient states that he has been feeling more fatigued and tired but attributes this to his lumbar radiculopathy.  He is also under a lot of stress as his wife has terminal cancer.   Patient was seen again by his PCP and directed to the ER due to his hemoglobin.   EDP is contacted [facility] for consultation.   [facility] contacted for admission.    ED Course: Hgb 6.8 Hospital Course:   Assessment and Plan: * Acute blood loss anemia Patient presented from PCPs office due to anemia.   -Patient noted to have had labs drawn which was 7.2 on 09/24/2022.   -Patient noted to have last hemoglobin of 12.7 on 05/16/2021 as noted on epic.  Patient noted to have had a hemoglobin of 7.1 prior to starting Eliquis.   -Hemoglobin repeat on presentation to the ED noted at 6.8.  -Anemia panel done consistent with a severe iron deficiency anemia with iron  level of 9, ferritin of 6, TIBC of 396.   -Status post transfusion 2 units packed red blood cells with hemoglobin stabilizing at 8.6 by day of discharge.   -Patient also received IV iron during this hospitalization.  -FOBT negative.   -Had colonoscopy in November 2019 that removed 1 sessile polyp. -Patient noted to have intermittent dark stools prior to admission. -Patient seen in consultation by GI and patient underwent EGD/colonoscopy 09/28/2022.   -EGD unremarkable.   -Colonoscopy concerning for ascending nonobstructing colonic  mass in the proximal ascending colon without any visible active bleeding, pathology pending. -CT staging scans of chest abdomen and pelvis were done which was negative for lymphadenopathy or metastatic disease. -CEA pending at time of discharge and need to be followed up upon. -Patient maintained on home regimen PPI during the hospitalization. -Patient seen by gastroenterology and patient will follow-up with GI in the outpatient setting. -Patient will be started on oral iron supplementation of 150 mg daily. -Outpatient follow-up with PCP.  Colonic mass -Noted on colonoscopy 09/28/2022 that patient with nonobstructing mass in the proximal ascending colon. -Noted on workup for severe iron deficiency anemia. -CT chest abdomen and pelvis negative for lymphadenopathy or metastatic disease.  -CEA pending at time of discharge. -Patient seen in consultation by general surgery who discussed laparoscopic/robotic partial colectomy on Monday with Dr. Marcello [name]. -Patient seen by Dr. Marcello [name] today who discussed surgery with patient in more detail and has given patient the option to schedule elective surgery in the next few weeks versus surgery this Monday. -It is noted per Dr. Manon Hilding note that patient prefers to schedule later if possible. -Staging CTs done were negative for metastatic disease or lymphadenopathy.   -Patient received IV iron during the hospitalization, hemoglobin stabilized and was 8.6 by day of discharge.   -General surgery assessed patient on day of discharge and as hemoglobin remains, patient improved clinically it was felt patient could be discharged safely with outpatient follow-up to schedule elective colectomy.   -Patient will be discharged in stable improved condition with outpatient follow-up with general surgery.    History of cancer of lower lobe bronchus or lung - s/p left lower lobectomy at [facility] 09-07-2021 Stable. Pt states he did not receive any chemo or radiation after lobectomy.  Follows with [facility]. Per their notes, pt has stage 1A3  -Outpatient follow-up.  Superficial vein thrombosis -Eliquis was held during the hospitalization and will not be resumed on discharge.  -No DVT or PE noted on lower extremity Dopplers on CT angiogram chest.   -Eliquis continued to be held during the hospitalization will be discontinued on discharge.  It was felt per general surgery that patient superficial vein thrombosis did not require anticoagulation at this time however will defer to PCP on follow-up for further management.    Neuropathic pain -Patient maintained on home regimen Neurontin.  Left lumbar radiculopathy - Patient maintained on home regimen Neurontin 600 mg twice daily.   GERD -Patient maintained on PPI during the hospitalization.   Prolapsed internal hemorrhoids, grade 2 and 3 Chronic. 06/03/2018 Grade 3 left lateral and grade 2 right posterior right anterior Left lateral banded with 2 bands. 12/4 banded RP and LL (again with 2 bands) 08/01/2018 left lateral banded x2 -Colonoscopy done noted some internal hemorrhoids.  IDA (iron deficiency anemia) -See acute blood loss anemia as noted above.        Procedures: Transfusion 2 units packed red blood cells 09/26/2022 EGD 09/28/2022 per Dr. Loletha Carrow III Colonoscopy 09/28/2022 per  Dr Loletha Carrow III  Consultations: Gastroenterology: Dr.Danis III 09/27/2022 General surgery: Dr. Brantley Stage 09/28/2022  Discharge Exam: Vitals:   09/29/22 2113 09/30/22 0515  BP: 116/81 109/82  Pulse: 74 71  Resp: 14 15  Temp: 98 F (36.7 C) 98 F (36.7 C)  SpO2: 98% 97%    General: NAD Cardiovascular: RRR no murmurs rubs or gallops.  No JVD.  No lower extremity edema. Respiratory: Clear to auscultation bilaterally.  No wheezes, no crackles, no rhonchi.  Fair air movement.  Speaking in full sentences.  Discharge Instructions   Discharge Instructions     Diet general   Complete by: As directed    Increase activity  slowly   Complete by: As directed       Allergies as of 09/30/2022   No Known Allergies      Medication List     STOP taking these medications    Apixaban Starter Pack (10mg  and 5mg ) Commonly known as: ELIQUIS STARTER PACK   cyclobenzaprine 10 MG tablet Commonly known as: FLEXERIL   hydrocortisone 2.5 % rectal cream Commonly known as: ANUSOL-HC       TAKE these medications    ASHWAGANDHA PO Take 450 mg by mouth in the morning and at bedtime.   aspirin EC 81 MG tablet Take 1 tablet (81 mg total) by mouth every evening. Start taking on: October 07, 2022 What changed: These instructions start on October 07, 2022. If you are unsure what to do until then, ask your doctor or other care provider.   finasteride 5 MG tablet Commonly known as: PROSCAR Take 1 tablet (5 mg total) by mouth daily.   Fish Oil 1200 MG Caps Take 1,200 mg by mouth daily.   gabapentin 300 MG capsule Commonly known as: NEURONTIN Take 1 capsule (300 mg total) by mouth 3 (three) times daily. (Actually 300 mg in AM and 600 mg QHS) x 1 week, then 600 mg BID- for nerve pain What changed:  how much to take when to take this additional instructions   iron polysaccharides 150 MG capsule Commonly known as: NIFEREX Take 1 capsule (150 mg total) by mouth daily.   Magnesium 400 MG Caps Take 400 mg by mouth 2 (two) times daily. asporotate   Melatonin 5 MG/ML Liqd Take 5 mg by mouth daily.   metoprolol succinate 25 MG 24 hr tablet Commonly known as: Toprol XL Take 1 tablet (25 mg total) by mouth daily.   metroNIDAZOLE 0.75 % gel Commonly known as: METROGEL Apply to face daily for rosacea   omeprazole 40 MG capsule Commonly known as: PRILOSEC TAKE (1) CAPSULE BY MOUTH ONCE DAILY.   polyethylene glycol powder 17 GM/SCOOP powder Commonly known as: GLYCOLAX/MIRALAX Take 0.5 Containers by mouth daily as needed for moderate constipation or mild constipation.   PROBIOTIC DAILY PO Take 1 tablet by  mouth daily.   senna-docusate 8.6-50 MG tablet Commonly known as: Senokot-S Take 2 tablets by mouth at bedtime as needed for mild constipation.   silodosin 8 MG Caps capsule Commonly known as: RAPAFLO Take 1 capsule (8 mg total) by mouth 2 (two) times daily.   TURMERIC PO Take 750 mg by mouth daily. curamed curcumin   Voltaren 1 % Gel Generic drug: diclofenac Sodium Apply 2 g topically daily as needed (pain).       No Known Allergies  Follow-up Information     Russell Spar, MD. Schedule an appointment as soon as possible for a visit in 2 week(s).  Specialty: Internal Medicine Why: Follow-up in 1 to 2 weeks Contact information: [address] [place] [address] [phone]         Russell Ruff, MD Follow up.   Specialties: General Surgery, Colon and Rectal Surgery Why: Office will call with appointment time. Contact information: [address] [place] [address] [phone]         Gatha Mayer, MD. Schedule an appointment as soon as possible for a visit in 3 week(s).   Specialty: Gastroenterology Why: Follow-up in 3 to 4 weeks. Contact information: [address] [address] [phone]                  The results of significant diagnostics from this hospitalization (including imaging, microbiology, ancillary and laboratory) are listed below for reference.    Significant Diagnostic Studies: CT CHEST ABDOMEN PELVIS W CONTRAST  Result Date: 09/29/2022 CLINICAL DATA:  New diagnosis colon cancer staging, additional history of lung cancer * Tracking Code: BO * EXAM: CT CHEST, ABDOMEN, AND PELVIS WITH CONTRAST TECHNIQUE: Multidetector CT imaging of the chest, abdomen and pelvis was performed following the standard protocol during bolus administration of intravenous contrast. RADIATION DOSE REDUCTION: This exam was performed according to the departmental dose-optimization program which includes automated  exposure control, adjustment of the mA and/or kV according to patient size and/or use of iterative reconstruction technique. CONTRAST:  174mL OMNIPAQUE IOHEXOL 300 MG/ML  SOLN COMPARISON:  CT chest angiogram, 09/24/2022 PET-CT, 06/01/2021 FINDINGS: CT CHEST FINDINGS Cardiovascular: No significant vascular findings. Normal heart size. No pericardial effusion. Mediastinum/Nodes: No enlarged mediastinal, hilar, or axillary lymph nodes. Thyroid gland, trachea, and esophagus demonstrate no significant findings. Lungs/Pleura: Unchanged mild pulmonary fibrosis in a pattern with apical to basal gradient, featuring irregular peripheral interstitial opacity septal thickening, mild traction bronchiectasis without significant subpleural bronchiolectasis. Some evidence of subpleural sparing (series 7, image 115). Fine clustered calcified centrilobular nodularity throughout the lung bases. Evidence of prior left lower lobe wedge resection. No pleural effusion or pneumothorax. Musculoskeletal: No chest wall abnormality. No acute osseous findings. CT ABDOMEN PELVIS FINDINGS Hepatobiliary: No solid liver abnormality is seen. Multiple attenuation liver cysts as well as as subcentimeter lesions too small to characterize although likely additional simple cysts, unchanged compared to prior PET-CT dated 2022, not previously FDG avid. No gallstones, gallbladder wall thickening, or biliary dilatation. Pancreas: Unremarkable. No pancreatic ductal dilatation or surrounding inflammatory changes. Spleen: Normal in size without significant abnormality. Adrenals/Urinary Tract: Adrenal glands are unremarkable. Kidneys are normal, without renal calculi, solid lesion, or hydronephrosis. Bladder is unremarkable. Stomach/Bowel: Stomach is within normal limits. Appendix appears normal. No evidence of bowel wall thickening, distention, or inflammatory changes. Vascular/Lymphatic: No significant vascular findings are present. No enlarged abdominal or  pelvic lymph nodes. Reproductive: Prostatomegaly. Other: No abdominal wall hernia or abnormality. No ascites. Musculoskeletal: No acute osseous findings. IMPRESSION: 1. No evidence of lymphadenopathy or metastatic disease in the chest, abdomen, or pelvis. 2. Reported primary colon malignancy is not well appreciated by CT. 3. Evidence of prior left lower lobe wedge resection. 4. Unchanged mild pulmonary fibrosis in a pattern with apical to basal gradient, featuring irregular peripheral interstitial opacity septal thickening, mild traction bronchiectasis without significant subpleural bronchiolectasis. Some evidence of subpleural sparing. Findings suggest an alternative diagnosis pattern by ATS pulmonary fibrosis criteria, differential considerations including fibrotic phase NSIP. 5. Prostatomegaly. Electronically Signed   By: Delanna Ahmadi M.D.   On: 09/29/2022 19:34   US Venous Img Lower  Unilateral Right  Result Date: 09/24/2022 CLINICAL DATA:  Right leg pain, concern for DVT EXAM: RIGHT LOWER EXTREMITY VENOUS DOPPLER ULTRASOUND TECHNIQUE: Gray-scale sonography with graded compression, as well as color Doppler and duplex ultrasound were performed to evaluate the lower extremity deep venous systems from the level of the common femoral vein and including the common femoral, femoral, profunda femoral, popliteal and calf veins including the posterior tibial, peroneal and gastrocnemius veins when visible. The superficial great saphenous vein was also interrogated. Spectral Doppler was utilized to evaluate flow at rest and with distal augmentation maneuvers in the common femoral, femoral and popliteal veins. COMPARISON:  None Available. FINDINGS: Contralateral Common Femoral Vein: Respiratory phasicity is normal and symmetric with the symptomatic side. No evidence of thrombus. Normal compressibility. Common Femoral Vein: No evidence of thrombus. Normal compressibility, respiratory phasicity and response to  augmentation. Saphenofemoral Junction: No evidence of thrombus. Normal compressibility and flow on color Doppler imaging. Profunda Femoral Vein: No evidence of thrombus. Normal compressibility and flow on color Doppler imaging. Femoral Vein: No evidence of thrombus. Normal compressibility, respiratory phasicity and response to augmentation. Popliteal Vein: No evidence of thrombus. Normal compressibility, respiratory phasicity and response to augmentation. Calf Veins: No evidence of thrombus. Normal compressibility and flow on color Doppler imaging. Superficial Great Saphenous Vein: Right mid thigh GSV demonstrates hypoechoic thrombus and is noncompressible compatible with superficial thrombosis/thrombophlebitis. IMPRESSION: 1. Negative for right lower extremity DVT. 2. Right mid thigh GSV superficial thrombosis/thrombophlebitis. Electronically Signed   By: Jerilynn Mages.  Shick M.D.   On: 09/24/2022 13:24   CT Angio Chest Pulmonary Embolism (PE) W or WO Contrast  Result Date: 09/24/2022 CLINICAL DATA:  Lung cancer. Shortness of breath upon exertion. Positive DVT. EXAM: CT ANGIOGRAPHY CHEST WITH CONTRAST TECHNIQUE: Multidetector CT imaging of the chest was performed using the standard protocol during bolus administration of intravenous contrast. Multiplanar CT image reconstructions and MIPs were obtained to evaluate the vascular anatomy. RADIATION DOSE REDUCTION: This exam was performed according to the departmental dose-optimization program which includes automated exposure control, adjustment of the mA and/or kV according to patient size and/or use of iterative reconstruction technique. CONTRAST:  32mL OMNIPAQUE IOHEXOL 350 MG/ML SOLN COMPARISON:  PET 06/01/2021 and CT chest 03/21/2021. FINDINGS: Cardiovascular: Negative for pulmonary embolus. Atherosclerotic calcification of the aorta. Heart is enlarged. No pericardial effusion. Mediastinum/Nodes: Mediastinal lymph nodes are not enlarged by CT size criteria. 1.5 cm right  hilar lymph node, possibly reactive. No axillary adenopathy. Esophagus is grossly unremarkable. Lungs/Pleura: Biapical pleuroparenchymal scarring. Subpleural reticulation and traction bronchiectasis/bronchiolectasis, minimally basilar predominant, as on 03/21/2021. Left lower lobe wedge resection. Trace left pleural fluid or pleural thickening. No suspicious pulmonary nodules. Airway is unremarkable. Upper Abdomen: Multiple low-attenuation lesions in the liver are likely cysts. Tiny stones in the fundus of the gallbladder. Adrenal glands and right kidney are unremarkable. Probable left renal sinus cyst. No specific follow-up necessary. Visualized portions of the spleen, pancreas, stomach and bowel are grossly unremarkable. No upper abdominal adenopathy. Musculoskeletal: Degenerative changes in the spine. Review of the MIP images confirms the above findings. IMPRESSION: 1. Negative for pulmonary embolus. 2. 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 If further evaluation is desired, high-resolution chest CT without contrast is recommended. 3. Cholelithiasis. 4.  Aortic atherosclerosis (ICD10-I70.0). Electronically Signed   By: Lorin Picket M.D.   On: 09/24/2022 13:03    Microbiology: No results found for this or any previous visit (from the past 240 hour(s)).   Labs: Basic Metabolic Panel:  Recent Labs  Lab 09/26/22 1853 09/27/22 0307 09/28/22 0651 09/29/22 0558 09/30/22 0536  NA 135 137 140 134* 137  K 3.8 4.2 3.7 3.5 3.9  CL 102 104 108 104 105  CO2 24 24 23 24 22   GLUCOSE 101* 118* 88 90 84  BUN 18 18 12 13 9   CREATININE 0.80 0.73 0.62 0.67 0.71  CALCIUM 8.4* 8.2* 8.6* 8.5* 8.7*  MG  --   --  2.3  --  2.3   Liver Function Tests: Recent Labs  Lab 09/26/22 1853 09/27/22 0307 09/29/22 0558  AST 23 18 16   ALT 16 14 13   ALKPHOS 64 59 56  BILITOT 0.5 1.4* 0.8  PROT 7.2 6.3* 6.3*  ALBUMIN 3.9 3.7 3.6   No results for  input(s): "LIPASE", "AMYLASE" in the last 168 hours. No results for input(s): "AMMONIA" in the last 168 hours. CBC: Recent Labs  Lab 09/24/22 1118 09/26/22 1203 09/26/22 1203 09/26/22 1853 09/27/22 0307 09/27/22 1715 09/28/22 0651 09/29/22 0558 09/30/22 0536  WBC 4.8 5.2   < > 5.2 5.0  --  4.9 5.8 4.7  NEUTROABS 3.3 2.8  --   --  2.4  --  2.9  --  2.6  HGB 7.2* 7.1*   < > 6.8* 7.5* 8.3* 8.4* 7.9* 8.6*  HCT 24.6* 24.0*   < > 23.3* 25.1* 27.7* 28.1* 26.3* 28.7*  MCV 62* 61.4*   < > 61.3* 64.4*  --  65.5* 65.6* 64.9*  PLT 340 316   < > 331 283  --  296 267 306   < > = values in this interval not displayed.   Cardiac Enzymes: No results for input(s): "CKTOTAL", "CKMB", "CKMBINDEX", "TROPONINI" in the last 168 hours. BNP: BNP (last 3 results) No results for input(s): "BNP" in the last 8760 hours.  ProBNP (last 3 results) No results for input(s): "PROBNP" in the last 8760 hours.  CBG: No results for input(s): "GLUCAP" in the last 168 hours.     Signed:  Irine Seal MD.  [facility] 09/30/2022, 10:01 AM

## 2022-09-30 NOTE — Consult Note (Signed)
      Consult Note  Russell [name] 08-Jun-1946  BR:[id number].    Requesting MD: Wilfrid Lund, MD Chief Complaint/Reason for Consult: ascending colon mass HPI:  Patient is a 77 year old male who presented to the ED with fatigue x 3 weeks. Gradually progressive with occasional feelings of lightheadedness and weakness. He was seen by PCP recently and diagnosed with superficial thrombophlebitis of lower extremity and started on Eliquis. He had 3 doses of this and then labs resulted with a significant drop in hemoglobin and PCP directed patient to the ED. Hgb noted to be 7.2 compared to previous baseline of 12.7. He reported intermittent dark stools with constipation and does have some chronic constipation as well. No history of prior GI bleeding and last colonoscopy was in 2019 and significant for one diminutive polyp. Last EGD in 2022 with gastric polyps but no evidence of malignancy.   PMH otherwise significant for Hx of CVA, Hx of non-small cell lung cancer s/p lobectomy, hx of cauda equina syndrome s/p laminectomy, GERD, peripheral neuropathy, internal hemorrhoids, chronic anemia, and OSA. No prior abdominal surgery. NKDA.    Assessment/Plan Ascending colon mass - s/p EGD and colonoscopy 3/15 which showed non-obstructing mass in proximal ascending colon without visible active bleeding, path is pending   - staging CTs show no sign of metastatic disease - CEA      P  FEN: Reg diet, IVF per [facility] VTE: stop Eliquis, hep sq ID: no current indication for abx  Hx of CVA Hx of stage Ia NSC lung CA s/p left lower lobectomy in February 2023 Hx of cauda equina syndrome s/p laminectomy in 2021 ABL on chronic anemia - secondary to GI losses Superficial vein thrombosis of LE - was started on Eliquis per PCP, generally superficial thrombosis does not require anticoagulation but defer to PCP to follow up recs  Peripheral neuropathy  GERD Internal hemorrhoids  OSA   Discussed with patient.  Hgb  stable.  I think it would be reasonable to d/c today and schedule elective colectomy.  May need gentle prep with MiraLAX antibiotics tomorrow.  Discussed surgical resection, techniques, complications, long-term expectations and expected recovery.The surgery and anatomy were described to the patient as well as the risks of surgery and the possible complications.  These include: Bleeding, deep abdominal infections and possible wound complications such as hernia and infection, damage to adjacent structures, leak of surgical connections, which can lead to other surgeries and possibly an ostomy, possible need for other procedures, such as abscess drains in radiology, possible prolonged hospital stay, possible diarrhea from removal of part of the colon, possible constipation from narcotics, prolonged fatigue/weakness or appetite loss, possible early recurrence of of disease, possible complications of their medical problems such as heart disease or arrhythmias or lung problems, death (less than 1%). I believe the patient understands and wishes to proceed with the surgery.  I reviewed [facility] notes, hospitalist notes, last 24 h vitals and pain scores, last 48 h intake and output, last 24 h labs and trends, and last 24 h imaging results.  This care required moderate level of medical decision making.    [facility] 09/30/2022, 7:27 AM Please see Amion for pager number during day hours 7:00am-4:30pm

## 2022-09-30 NOTE — Progress Notes (Signed)
Discharge instructions explained to patient. Next due medications written on his discharge medication  papers. Prescription given to patient. Mr Cord picked up by his wife and will be taking him home. He was discharged via wheelchair.

## 2022-09-30 NOTE — Progress Notes (Signed)
Progress Note  Russell [name] 03-Jul-1946  [id number].    Objective Patient is a 77 year old male who presented to the ED with fatigue x 3 weeks. Gradually progressive with occasional feelings of lightheadedness and weakness. He was seen by PCP recently and diagnosed with superficial thrombophlebitis of lower extremity and started on Eliquis. He had 3 doses of this and then labs resulted with a significant drop in hemoglobin and PCP directed patient to the ED. Hgb noted to be 7.2 compared to previous baseline of 12.7. He reported intermittent dark stools with constipation and does have some chronic constipation as well. No history of prior GI bleeding and last colonoscopy was in 2019 and significant for one diminutive polyp. Last EGD in 2022 with gastric polyps but no evidence of malignancy.   PMH otherwise significant for Hx of CVA, Hx of non-small cell lung cancer s/p lobectomy, hx of cauda equina syndrome s/p laminectomy, GERD, peripheral neuropathy, internal hemorrhoids, chronic anemia, and OSA. No prior abdominal surgery. NKDA.    Assessment/Plan Ascending colon mass - s/p EGD and colonoscopy 3/15 which showed non-obstructing mass in proximal ascending colon without visible active bleeding, path is pending   - staging CTs show no sign of metastatic disease - CEA      P  FEN: Reg diet, IVF per [facility] VTE: stop Eliquis, hep sq ID: no current indication for abx  Hx of CVA Hx of stage Ia NSC lung CA s/p left lower lobectomy in February 2023 Hx of cauda equina syndrome s/p laminectomy in 2021 ABL on chronic anemia - secondary to GI losses Superficial vein thrombosis of LE - was started on Eliquis per PCP, generally superficial thrombosis does not require anticoagulation but defer to PCP to follow up recs  Peripheral neuropathy  GERD Internal hemorrhoids  OSA   Discussed with patient.  Hgb stable.  I think it would be reasonable to d/c today and schedule elective colectomy.  May need  gentle prep with MiraLAX antibiotics tomorrow.  Discussed surgical resection, techniques, complications, long-term expectations and expected recovery.The surgery and anatomy were described to the patient as well as the risks of surgery and the possible complications.  These include: Bleeding, deep abdominal infections and possible wound complications such as hernia and infection, damage to adjacent structures, leak of surgical connections, which can lead to other surgeries and possibly an ostomy, possible need for other procedures, such as abscess drains in radiology, possible prolonged hospital stay, possible diarrhea from removal of part of the colon, possible constipation from narcotics, prolonged fatigue/weakness or appetite loss, possible early recurrence of of disease, possible complications of their medical problems such as heart disease or arrhythmias or lung problems, death (less than 1%). I believe the patient understands and wishes to proceed with the surgery.  I reviewed Consultant GI notes, hospitalist notes, last 24 h vitals and pain scores, last 48 h intake and output, last 24 h labs and trends, and last 24 h imaging results.  This care required moderate level of medical decision making.    [facility] 09/30/2022, 7:27 AM Please see Amion for pager number during day hours 7:00am-4:30pm

## 2022-09-30 NOTE — Progress Notes (Signed)
Surgical& medical notes and plan reviewed.  GI service signing off. Dr. Carlean Purl (patient's primary GI) aware of findings and will contact patient with biopsy results.   Wilfrid Lund, MD    [facility]

## 2022-10-01 LAB — SURGICAL PATHOLOGY

## 2022-10-01 NOTE — Therapy (Unsigned)
OUTPATIENT PHYSICAL THERAPY THORACOLUMBAR EVALUATION   Patient Name: Russell [name] MRN: [id number] DOB:05-21-1946, 77 y.o., male Today's Date: 10/03/2022  END OF SESSION:  PT End of Session - 10/03/22 1124     Visit Number 1    Number of Visits 12    Date for PT Re-Evaluation 11/14/22    Authorization Type [employer]    PT Start Time 1030    PT Stop Time 1120    PT Time Calculation (min) 50 min    Activity Tolerance Patient tolerated treatment well    Behavior During Therapy [facility] for tasks assessed/performed             Past Medical History:  Diagnosis Date   Anemia 2019   Arachnoiditis    Arthritis    Bleeding internal hemorrhoids 09/10/2006   Qualifier: Diagnosis of  By: Jonna Munro MD, Cornelius     Cancer [facility]) 2022   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[place]) 2011   Cerebellar infarct ([place])    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   anterior cer disc and fusion with plates C3-4 and D34-534 at [facility]   Constipation    Encounter for general adult medical examination with abnormal findings 06/27/2022   Enlarged prostate    GERD (gastroesophageal reflux disease)    Hemorrhoids    History of kidney stones    passed stones   History of lumbar laminectomy 05/12/2020   open L2-L5 at [facility]   Hx of colonic polyp 05/29/2018   05/2018 cecal ssp   Lung nodule 02/2021   Maxillary sinus polyp    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   Prolapsed internal hemorrhoids, grade 3 09/10/2006   Qualifier: Diagnosis of  By: Jonna Munro MD, Cornelius     Salivary gland enlargement    Sleep apnea 10 years ago   declined CPAP   Stroke ([place]) 15-20 years ago   Past Surgical History:  Procedure Laterality Date   BIOPSY  09/28/2022   Procedure: BIOPSY;  Surgeon: Doran Stabler, MD;  Location: [facility];  Service: Gastroenterology;;   BRONCHIAL BIOPSY  05/16/2021   Procedure: BRONCHIAL BIOPSIES;  Surgeon: Garner Nash, DO;  Location: [place] [facility];  Service: Pulmonary;;   BRONCHIAL BRUSHINGS  05/16/2021    Procedure: BRONCHIAL BRUSHINGS;  Surgeon: Garner Nash, DO;  Location: [place];  Service: Pulmonary;;   BRONCHIAL NEEDLE ASPIRATION BIOPSY  05/16/2021   Procedure: BRONCHIAL NEEDLE ASPIRATION BIOPSIES;  Surgeon: Garner Nash, DO;  Location: [place];  Service: Pulmonary;;   CERVICAL SPINE SURGERY  04/10/2020   C3-C4 with pin at [facility]   COLONOSCOPY     COLONOSCOPY WITH PROPOFOL N/A 09/28/2022   Procedure: COLONOSCOPY WITH PROPOFOL;  Surgeon: Doran Stabler, MD;  Location: [facility];  Service: Gastroenterology;  Laterality: N/A;   ESOPHAGOGASTRODUODENOSCOPY (EGD) WITH PROPOFOL N/A 09/28/2022   Procedure: ESOPHAGOGASTRODUODENOSCOPY (EGD) WITH PROPOFOL;  Surgeon: Doran Stabler, MD;  Location: [facility];  Service: Gastroenterology;  Laterality: N/A;   FIDUCIAL MARKER PLACEMENT  05/16/2021   Procedure: FIDUCIAL MARKER PLACEMENT;  Surgeon: Garner Nash, DO;  Location: [place] [facility];  Service: Pulmonary;;   HEMORRHOID BANDING     LUMBAR LAMINECTOMY  03/08/2010   L2-3, cauda equina syndrome   lumbar laminectomy  05/12/2020   [facility]   SPINE SURGERY     [facility] ENDOSCOPY     VIDEO BRONCHOSCOPY WITH ENDOBRONCHIAL NAVIGATION Left 05/16/2021   Procedure: VIDEO BRONCHOSCOPY WITH ENDOBRONCHIAL NAVIGATION;  Surgeon: Garner Nash,  DO;  Location: [place] [facility];  Service: Pulmonary;  Laterality: Left;  ION w/ fiducial placement   VIDEO BRONCHOSCOPY WITH RADIAL ENDOBRONCHIAL ULTRASOUND  05/16/2021   Procedure: VIDEO BRONCHOSCOPY WITH RADIAL ENDOBRONCHIAL ULTRASOUND;  Surgeon: Garner Nash, DO;  Location: [place] [facility];  Service: Pulmonary;;   wisdom teeth ext     Patient Active Problem List   Diagnosis Date Noted   Colonic mass 09/29/2022   IDA (iron deficiency anemia) 09/26/2022   Acute blood loss anemia 09/26/2022   History of cancer of lower lobe bronchus or lung - s/p left lower lobectomy at [facility] 09-07-2021 09/26/2022   Superficial vein thrombosis 09/24/2022    Neuropathic pain 08/31/2022   Neurogenic bladder 08/31/2022   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   Cough 07/20/2022   Encounter for general adult medical examination with abnormal findings 06/27/2022   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 ([place]) 05/23/2021   Former smoker 05/05/2021   Elevated PSA 02/16/2021   Erectile dysfunction due to arterial insufficiency 05/11/2020   Benign prostatic hyperplasia with urinary obstruction 04/11/2020   DDD (degenerative disc disease), cervical 06/11/2018   DDD (degenerative disc disease), lumbar 06/11/2018   History of kidney stones 06/11/2018   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[place]) 06/11/2018   Primary osteoarthritis of left knee 06/11/2018   Primary osteoarthritis of both feet 06/11/2018   Plantar fasciitis 06/11/2018   Hx of colonic polyp 05/29/2018   Left lumbar radiculopathy [id number]   Diastolic dysfunction [id number]   Psoriasis 10/15/2016   GERD 10/02/2007   Prolapsed internal hemorrhoids, grade 2 and 3 09/10/2006   SLEEP APNEA 09/10/2006    PCP: Ihor Dow  REFERRING PROVIDER: Courtney Heys, MD  REFERRING DIAG:  Diagnosis  G83.4 (ICD-10-CM) - Incomplete cauda equina syndrome ([place])    Rationale for Evaluation and Treatment: Rehabilitation  THERAPY DIAG:  Difficulty in walking Muscle weakness Decreased balance  ONSET DATE: chronic                                                                                                                                                                        SUBJECTIVE STATEMENT: Pt states that his main concern is balance and stamina.  He had a back surgery in 2021 and when he woke he noted numbness in his leg, he has been having problems ever since.  He states that he can sit for 30 minutes in comfort, he can stand for 15 minutes and he can walk for 15 minutes.  He normally uses a cane to walk with.   PERTINENT HISTORY:  77 year old male history of stage Ia  non-small cell lung cancer status post left lower lobe lobectomy in 2023, history of cauda equina syndrome status post laminectomy October  2021, history of internal hemorrhoids, history of degenerative disc disease, colon cancer which was making him severely anemic. Per Dr. Dagoberto Ligas Pt has incomplete cauda equina syndrome with impaired strength and balance- needs eval and treat to focus on balance and strengthening- also pt c/o muscle spasms- usually cauda equina don't have spasticity, but if you disagree, let me know PAIN:  Are you having pain? Yes: NPRS scale: 0/10; worst pain in the past week 4 back and down his Rt leg to his knee.    PRECAUTIONS: None  WEIGHT BEARING RESTRICTIONS: No  FALLS:  Has patient fallen in last 6 months? Yes. Number of falls 4  LIVING ENVIRONMENT: Lives with: lives with their family Lives in: House/apartment Stairs: Yes: Internal: 15 steps; on right going up and External: 4 steps; on right going up; has an elevator that he uses inside and a ramp outside.  Has following equipment at home: Single point cane  OCCUPATION: retired  PLOF: Independent with community mobility with device  PATIENT GOALS: To walk better, improved balance, to be able to get on and off his knees I to work in his yard.   NEXT MD VISIT: not scheduled at this time   OBJECTIVE:   PATIENT SURVEYS:  FOTO 44    COGNITION: Overall cognitive status: Within functional limits for tasks assessed     SENSATION: Not tested  MUSCLE LENGTH: Hamstrings: Right 140 deg; Left 145 deg   POSTURE: rounded shoulders, forward head, decreased lumbar lordosis, decreased thoracic kyphosis, and flexed trunk   PALPATION: Tight paraspinal   LUMBAR ROM: not tested   AROM eval  Flexion   Extension   Right lateral flexion   Left lateral flexion   Right rotation   Left rotation    (Blank rows = not tested) LE Strength:  Ankle plantar flexion B 2/5           Dorsiflexion Rt 2; Lt 3- Knee  extension B 5/5 Knee flexion:  LT 3/5; RT 5/5 Hip extension:  LT 4/5, Rt 5/5 Hip abduction: Lt: 4+/5: Rt; 5/5 FUNCTIONAL TESTS:  30 seconds chair stand test:  2 2 minute walk test: test next treatment.             Single leg stance:  Rt: 0  , Lt: 0   TODAY'S TREATMENT:                                                                                                                              DATE:  11/03/22:  Supine:  Bridge x 5 Knee to chest 30" hold x 1 Active hamstring stretch hold 30" x 1 Prone : LT hamstring curl x 5 Standing: UE assist heel raise Sititng heelraise/toe raise x 10      PATIENT EDUCATION:  Education details: HEP Person educated: Patient Education method: Explanation, Verbal cues, and Handouts Education comprehension: verbalized understanding and returned demonstration  HOME EXERAccess Code: [email or web address] URL: [email or web address] Date: 10/03/2022  Prepared by: Real with Counter Support  - 1 x daily - 7 x weekly - 1 sets - 10 reps - 5" hold - Mini Squat with Counter Support  - 1 x daily - 7 x weekly - 1 sets - 10 reps - 5" hold - Sit to Stand  - 1 x daily - 7 x weekly - 1 sets - 10 reps - Supine Single Knee to Chest Stretch  - 1 x daily - 7 x weekly - 1 sets - 3 reps - 30" hold - Supine Hamstring Stretch  - 1 x daily - 7 x weekly - 1 sets - 3 reps - 30" hold - Supine Bridge  - 2 x daily - 7 x weekly - 1 sets - 10 reps - 3 hold - Seated Ankle Dorsiflexion AROM  - 3 x daily - 7 x weekly - 1 sets - 10 reps - 5 holdCISE PROGRAM:  ASSESSMENT:  CLINICAL IMPRESSION: Patient is a 77 y.o. male who was seen today for physical therapy evaluation and treatment for  incomplete cauda equina syndrome.  Evaluation demonstrates decreased activity tolerance, decreased strength, decreased balance and increased pain.  Mr. Siefker will benefit from skilled PT to address these issues and maximize his functioning ability.  .    OBJECTIVE IMPAIRMENTS: Abnormal gait, decreased activity tolerance, difficulty walking, decreased strength, and pain.   ACTIVITY LIMITATIONS: carrying, lifting, standing, stairs, and locomotion level  PARTICIPATION LIMITATIONS: shopping, community activity, and yard work  PERSONAL FACTORS: Fitness, Time since onset of injury/illness/exacerbation, and 1-2 comorbidities: cancer, OA  are also affecting patient's functional outcome.   REHAB POTENTIAL: Fair    CLINICAL DECISION MAKING: Stable/uncomplicated  EVALUATION COMPLEXITY: Moderate   GOALS: Goals reviewed with patient? No  SHORT TERM GOALS: Target date: 10/24/22  PT to be I in HEP in order to be able to decrease his pain to no greater than a 2/10 Baseline: Goal status: INITIAL  2.  PT strength to be increased 1/2 grade to be able to go up and down 4 steps in a reciprocal manner  Baseline:  Goal status: INITIAL  3.  Pt balance to be improved to be able to single leg stance for 5 seconds to decrease risk of falling  Baseline:  Goal status: INITIAL    LONG TERM GOALS: Target date: 11/14/22  PT to be I in an advanced HEP in order to be able to be able to walk for 30 minutes. Baseline:  Goal status: INITIAL  2.    PT strength to be increased 1 grade to be able to come sit to stand from a couch without difficulty  Baseline:  Goal status: INITIAL  3.  Pt balance to be improved to be able to single leg stance for 10 seconds to reduce risk of falling  Baseline:  Goal status: INITIAL  4.  PT to be able to put on his own shoes and socks. Baseline:  Goal status: INITIAL  5.  PT to be able to get down on his hands and knees I and return to upright position  Baseline:  Goal status: INITIAL  6.  PT to be able to walk for a mile without difficulty.  Baseline:  Goal status: INITIAL  PLAN:  PT FREQUENCY: 2x/week  PT DURATION: 6 weeks  PLANNED INTERVENTIONS: Therapeutic exercises, Therapeutic activity, Neuromuscular  re-education, Balance training, Gait training, Patient/Family education, and Self Care.  PLAN FOR NEXT SESSION: Strengthening and balance exercises to  improve functional ability.  Rayetta Humphrey, [place] CLT [phone]   1120

## 2022-10-02 ENCOUNTER — Telehealth: Payer: Self-pay | Admitting: Internal Medicine

## 2022-10-02 ENCOUNTER — Telehealth: Payer: Self-pay | Admitting: Gastroenterology

## 2022-10-02 ENCOUNTER — Telehealth: Payer: Self-pay

## 2022-10-02 NOTE — Telephone Encounter (Signed)
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  He had seen Dr. [name] in the hospital.  He has a pending appointment with me in April we can cancel that and he is aware.  He has primary care follow-up soon and they can recheck hemoglobin then and he is on iron supplementation.  Patti  please cancel that appointment and also contact [facility] about a follow-up appointment with Dr. [name] who has already seen the patient, regarding colon cancer and need for surgery

## 2022-10-02 NOTE — Telephone Encounter (Signed)
Inbound call from patient wanted to speak with a nurse in regard a appointment.He is Dr.Gessner patient however he had a procedure this past Friday with Dr.Danis and he is looking to see Dr.Gessner before his surgery in 3 weeks .Please advise

## 2022-10-02 NOTE — Telephone Encounter (Signed)
Pt stated that his [facility]. wanted him to follow up with Dr. Carlean Purl in 3 to 4 weeks due to Colon Cancer. Pt was scheduled on 10/30/2022 at 8:50 AM to see Dr. Carlean Purl. Pt made aware.  Pt verbalized understanding with all questions answered.

## 2022-10-02 NOTE — Transitions of Care (Post Inpatient/ED Visit) (Signed)
   10/02/2022  Name: Russell [name] MRN: [id number] DOB: 04/17/1946  Today's TOC FU Call Status: Today's TOC FU Call Status:: Successful TOC FU Call Competed TOC FU Call Complete Date: 10/02/22  Transition Care Management Follow-up Telephone Call Date of Discharge: 09/30/22 Discharge Facility[facility]) Type of Discharge: Inpatient Admission Primary Inpatient Discharge Diagnosis:: Acute Blood Loss Anemia How have you been since you were released from the hospital?: Better Any questions or concerns?: No  Items Reviewed: Did you receive and understand the discharge instructions provided?: Yes Medications obtained and verified?: Yes (Medications Reviewed) Any new allergies since your discharge?: No Dietary orders reviewed?: Yes Type of Diet Ordered:: Regular Do you have support at home?: Yes People in Home: spouse Name of Support/Comfort Primary Source: Wife- [facility] and Equipment/Supplies: [place] Ordered?: No Any new equipment or medical supplies ordered?: No  Functional Questionnaire: Do you need assistance with bathing/showering or dressing?: No Do you need assistance with meal preparation?: No Do you need assistance with eating?: No Do you have difficulty maintaining continence: No Do you need assistance with getting out of bed/getting out of a chair/moving?: No Do you have difficulty managing or taking your medications?: No  Follow up appointments reviewed: PCP Follow-up appointment confirmed?: Yes Date of PCP follow-up appointment?: 10/08/22 Follow-up Provider: Dr. Court Joy [facility] Follow-up appointment confirmed?: Yes Date of Specialist follow-up appointment?: 10/03/22 Follow-Up Specialty Provider:: Rayetta Humphrey MD Do you need transportation to your follow-up appointment?: No Do you understand care options if your condition(s) worsen?: Yes-patient verbalized understanding  SDOH Interventions Today    Flowsheet Row  Most Recent Value  SDOH Interventions   Food Insecurity Interventions Intervention Not Indicated  Transportation Interventions Intervention Not Indicated     Johnney Killian, RN, BSN, CCM Care Management Coordinator [facility] Phone: [phone]: [phone]

## 2022-10-03 ENCOUNTER — Ambulatory Visit (HOSPITAL_COMMUNITY): Payer: Medicare PPO | Attending: Physical Medicine and Rehabilitation | Admitting: Physical Therapy

## 2022-10-03 ENCOUNTER — Other Ambulatory Visit: Payer: Self-pay

## 2022-10-03 DIAGNOSIS — M6281 Muscle weakness (generalized): Secondary | ICD-10-CM

## 2022-10-03 DIAGNOSIS — M5459 Other low back pain: Secondary | ICD-10-CM | POA: Diagnosis not present

## 2022-10-03 DIAGNOSIS — Z9181 History of falling: Secondary | ICD-10-CM | POA: Diagnosis not present

## 2022-10-03 DIAGNOSIS — G834 Cauda equina syndrome: Secondary | ICD-10-CM | POA: Insufficient documentation

## 2022-10-03 NOTE — Telephone Encounter (Signed)
April appointment with Dr Carlean Purl cancelled and Russell [name] informed that [facility] will reach out to him to set up surgery, no office appointment needed.

## 2022-10-08 ENCOUNTER — Ambulatory Visit: Payer: Medicare PPO | Admitting: Internal Medicine

## 2022-10-08 VITALS — BP 111/75 | HR 60 | Resp 16 | Ht 72.0 in | Wt 194.0 lb

## 2022-10-08 DIAGNOSIS — I82890 Acute embolism and thrombosis of other specified veins: Secondary | ICD-10-CM

## 2022-10-08 DIAGNOSIS — D62 Acute posthemorrhagic anemia: Secondary | ICD-10-CM

## 2022-10-08 DIAGNOSIS — D500 Iron deficiency anemia secondary to blood loss (chronic): Secondary | ICD-10-CM | POA: Diagnosis not present

## 2022-10-08 DIAGNOSIS — Z09 Encounter for follow-up examination after completed treatment for conditions other than malignant neoplasm: Secondary | ICD-10-CM | POA: Diagnosis not present

## 2022-10-08 NOTE — Assessment & Plan Note (Addendum)
Here for hospital follow up. Eliqus continued to be held in setting of anemia and treatment of SVT in right GSV deferred to our office. He is asymptomatic today.  No clot seen in right mid thigh on ultrasound. Vein compressible and flow with augmentation seen.  No further treatment needed.

## 2022-10-08 NOTE — Progress Notes (Signed)
   HPI:RussellRussell [name] is a 77 y.o. male who presents for evaluation of Transitions Of Care (Discharged on 3/17) . For the details of today's visit, please refer to the assessment and plan.  Physical Exam: Vitals:   10/08/22 1547  BP: 111/75  Pulse: 60  Resp: 16  SpO2: 95%  Weight: 194 lb (88 kg)  Height: 6' (1.829 m)     Physical Exam Constitutional:      General: He is not in acute distress.    Appearance: He is not ill-appearing.     Comments: Using cane to ambulate  Eyes:     Conjunctiva/sclera: Conjunctivae normal.  Cardiovascular:     Rate and Rhythm: Normal rate and regular rhythm.     Heart sounds: No murmur heard. Skin:    General: Skin is warm.     Coloration: Skin is not jaundiced.       Assessment & Plan:   Russell [name] was seen today for transitions of care.  Superficial vein thrombosis Assessment & Plan: Here for hospital follow up. Russell [name] continued to be held in setting of anemia and treatment of SVT in right GSV deferred to our office. He is asymptomatic today.  No clot seen in right mid thigh on ultrasound. Vein compressible and flow with augmentation seen.  No further treatment needed.    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 His Hgb 7.9 at discharge. He has continued taking polysaccharide iron supplement, 150 mg daily.    Check CBC Check Ferritin BMP  Orders: -     CBC with Differential/Platelet -     BMP8+EGFR -     Ferritin      Russell Dy, MD

## 2022-10-08 NOTE — Patient Instructions (Addendum)
Thank you, [name] for allowing [place] to provide your care today.   I have ordered the following labs for you:   Lab Orders         CBC with Differential/Platelet         BMP8+EGFR         Ferritin        Reminders: I will follow-up with results of labs today.  If your blood counts are stable then we will have you follow-up after your surgery.    Tamsen Snider, M.D.

## 2022-10-08 NOTE — Assessment & Plan Note (Signed)
Patient found to have colon cancer of ascending colon. His Hgb 7.9 at discharge. He has continued taking polysaccharide iron supplement, 150 mg daily.    Check CBC Check Ferritin BMP

## 2022-10-09 ENCOUNTER — Ambulatory Visit (HOSPITAL_COMMUNITY): Payer: Medicare PPO | Admitting: Physical Therapy

## 2022-10-09 DIAGNOSIS — Z9181 History of falling: Secondary | ICD-10-CM | POA: Diagnosis not present

## 2022-10-09 DIAGNOSIS — M5459 Other low back pain: Secondary | ICD-10-CM | POA: Diagnosis not present

## 2022-10-09 DIAGNOSIS — G834 Cauda equina syndrome: Secondary | ICD-10-CM | POA: Diagnosis not present

## 2022-10-09 DIAGNOSIS — M6281 Muscle weakness (generalized): Secondary | ICD-10-CM

## 2022-10-09 LAB — CBC WITH DIFFERENTIAL/PLATELET

## 2022-10-09 NOTE — Therapy (Signed)
OUTPATIENT PHYSICAL THERAPY TREATMENT   Patient Name: Russell [name] MRN: [id number] DOB:1946/04/03, 77 y.o., male Today's Date: 10/09/2022  END OF SESSION:  PT End of Session - 10/09/22 1705     Visit Number 2    Number of Visits 12    Date for PT Re-Evaluation 11/14/22    Authorization Type [employer]    Progress Note Due on Visit 10    PT Start Time 1435    PT Stop Time 1516    PT Time Calculation (min) 41 min    Activity Tolerance Patient tolerated treatment well    Behavior During Therapy [facility] for tasks assessed/performed              Past Medical History:  Diagnosis Date   Anemia 2019   Arachnoiditis    Arthritis    Bleeding internal hemorrhoids 09/10/2006   Qualifier: Diagnosis of  By: Jonna Munro MD, Cornelius     Cancer [facility]) 2022   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[place]) 2011   Cerebellar infarct ([place])    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   anterior cer disc and fusion with plates C3-4 and D34-534 at [facility]   Constipation    Encounter for general adult medical examination with abnormal findings 06/27/2022   Enlarged prostate    GERD (gastroesophageal reflux disease)    Hemorrhoids    History of kidney stones    passed stones   History of lumbar laminectomy 05/12/2020   open L2-L5 at [facility]   Hx of colonic polyp 05/29/2018   05/2018 cecal ssp   Lung nodule 02/2021   Maxillary sinus polyp    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   Prolapsed internal hemorrhoids, grade 3 09/10/2006   Qualifier: Diagnosis of  By: Jonna Munro MD, Cornelius     Salivary gland enlargement    Sleep apnea 10 years ago   declined CPAP   Stroke ([place]) 15-20 years ago   Past Surgical History:  Procedure Laterality Date   BIOPSY  09/28/2022   Procedure: BIOPSY;  Surgeon: Doran Stabler, MD;  Location: [facility];  Service: Gastroenterology;;   BRONCHIAL BIOPSY  05/16/2021   Procedure: BRONCHIAL BIOPSIES;  Surgeon: Garner Nash, DO;  Location: [address];  Service: Pulmonary;;   BRONCHIAL  BRUSHINGS  05/16/2021   Procedure: BRONCHIAL BRUSHINGS;  Surgeon: Garner Nash, DO;  Location: [place];  Service: Pulmonary;;   BRONCHIAL NEEDLE ASPIRATION BIOPSY  05/16/2021   Procedure: BRONCHIAL NEEDLE ASPIRATION BIOPSIES;  Surgeon: Garner Nash, DO;  Location: [place];  Service: Pulmonary;;   CERVICAL SPINE SURGERY  04/10/2020   C3-C4 with pin at [facility]   COLONOSCOPY     COLONOSCOPY WITH PROPOFOL N/A 09/28/2022   Procedure: COLONOSCOPY WITH PROPOFOL;  Surgeon: Doran Stabler, MD;  Location: [facility];  Service: Gastroenterology;  Laterality: N/A;   ESOPHAGOGASTRODUODENOSCOPY (EGD) WITH PROPOFOL N/A 09/28/2022   Procedure: ESOPHAGOGASTRODUODENOSCOPY (EGD) WITH PROPOFOL;  Surgeon: Doran Stabler, MD;  Location: [facility];  Service: Gastroenterology;  Laterality: N/A;   FIDUCIAL MARKER PLACEMENT  05/16/2021   Procedure: FIDUCIAL MARKER PLACEMENT;  Surgeon: Garner Nash, DO;  Location: [place] [facility];  Service: Pulmonary;;   HEMORRHOID BANDING     LUMBAR LAMINECTOMY  03/08/2010   L2-3, cauda equina syndrome   lumbar laminectomy  05/12/2020   [facility]   SPINE SURGERY     UPPER GI ENDOSCOPY     VIDEO BRONCHOSCOPY WITH ENDOBRONCHIAL NAVIGATION Left 05/16/2021   Procedure: VIDEO  BRONCHOSCOPY WITH ENDOBRONCHIAL NAVIGATION;  Surgeon: Garner Nash, DO;  Location: [place];  Service: Pulmonary;  Laterality: Left;  ION w/ fiducial placement   VIDEO BRONCHOSCOPY WITH RADIAL ENDOBRONCHIAL ULTRASOUND  05/16/2021   Procedure: VIDEO BRONCHOSCOPY WITH RADIAL ENDOBRONCHIAL ULTRASOUND;  Surgeon: Garner Nash, DO;  Location: [place] [facility];  Service: Pulmonary;;   wisdom teeth ext     Patient Active Problem List   Diagnosis Date Noted   Colonic mass 09/29/2022   IDA (iron deficiency anemia) 09/26/2022   Acute blood loss anemia 09/26/2022   History of cancer of lower lobe bronchus or lung - s/p left lower lobectomy at [facility] 09-07-2021 09/26/2022   Superficial vein  thrombosis 09/24/2022   Neuropathic pain 08/31/2022   Neurogenic bladder 08/31/2022   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   Cough 07/20/2022   Encounter for general adult medical examination with abnormal findings 06/27/2022   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 ([place]) 05/23/2021   Former smoker 05/05/2021   Elevated PSA 02/16/2021   Erectile dysfunction due to arterial insufficiency 05/11/2020   Benign prostatic hyperplasia with urinary obstruction 04/11/2020   DDD (degenerative disc disease), cervical 06/11/2018   DDD (degenerative disc disease), lumbar 06/11/2018   History of kidney stones 06/11/2018   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[place]) 06/11/2018   Primary osteoarthritis of left knee 06/11/2018   Primary osteoarthritis of both feet 06/11/2018   Plantar fasciitis 06/11/2018   Hx of colonic polyp 05/29/2018   Left lumbar radiculopathy [id number]   Diastolic dysfunction [id number]   Psoriasis 10/15/2016   GERD 10/02/2007   Prolapsed internal hemorrhoids, grade 2 and 3 09/10/2006   SLEEP APNEA 09/10/2006    PCP: Ihor Dow  REFERRING PROVIDER: Courtney Heys, MD  REFERRING DIAG:  Diagnosis  G83.4 (ICD-10-CM) - 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  Reports they are putting an elevator in his house and making it all handicap accessible due to failing function of his wife (uterine cancer) and himself.  Currently scheduled to have surgery to remove his colon cancer on 11/08/22.  Basically is weak from his cauda equina syndrome and looses his balance a lot.     Pt states that his main concern is balance and stamina.  He had a back surgery in 2021 and when he woke he  noted numbness in his leg, he has been having problems ever since.  He states that he can sit for 30 minutes in comfort, he can stand for 15 minutes and he can walk for 15 minutes.  He normally uses a cane to walk with.   PERTINENT HISTORY:  77 year old male history of stage Ia non-small cell lung cancer status post left lower lobe lobectomy in 2023, history of cauda equina syndrome status post laminectomy October 2021, history of internal hemorrhoids, history of degenerative disc disease, upcoming colon cancer surgery on 11/08/22. 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 Yes: NPRS scale: 0/10; worst pain in the past week 4 back and down his Rt leg to his knee.   PRECAUTIONS: None  WEIGHT BEARING RESTRICTIONS: No  FALLS:  Has patient fallen in last 6 months? Yes. Number of falls 4  LIVING ENVIRONMENT: Lives with: lives with their family Lives in: House/apartment Stairs: Yes: Internal: 15 steps; on right going up and External: 4 steps; on right going up; has an elevator that he uses inside and a ramp outside.  Has following equipment at home: Single point cane  OCCUPATION: retired  PLOF: Independent with community mobility with device  PATIENT GOALS: To walk better, improved balance, to be able to get on and off his knees I to work in his yard.   NEXT MD VISIT: not scheduled at this time   OBJECTIVE:   PATIENT SURVEYS:  FOTO 44    COGNITION: Overall cognitive status: Within functional limits for tasks assessed     SENSATION: Not tested  MUSCLE LENGTH: Hamstrings: Right 140 deg; Left 145 deg   POSTURE: rounded shoulders, forward head, decreased lumbar lordosis, decreased thoracic kyphosis, and flexed trunk    PALPATION: Tight paraspinal   LUMBAR ROM: not tested   AROM eval  Flexion   Extension   Right lateral flexion   Left lateral flexion   Right rotation   Left rotation    (Blank rows = not tested) LE Strength:  Ankle plantar flexion B 2/5           Dorsiflexion Rt 2; Lt 3- Knee extension B 5/5 Knee flexion:  LT 3/5; RT 5/5 Hip extension:  LT 4/5, Rt 5/5 Hip abduction: Lt: 4+/5: Rt; 5/5 FUNCTIONAL TESTS:  30 seconds chair stand test:  2 2 minute walk test: test next treatment.             Single leg stance:  Rt: 0  , Lt: 0   TODAY'S TREATMENT:                                                                                                                              DATE:  10/09/22: Standing: heelraises 15X  Toeraises 15X (alt one at time)  Tandem stance each LE  lead 3 trials each 20"  Sit to stands no UE 10X with cues to for control Supine:  Bridge 20X  SLR 10X each LE    11/03/22:  Supine:  Bridge x 5 Knee to chest 30" hold x 1 Active hamstring stretch hold 30" x 1 Prone : LT hamstring curl x 5 Standing: UE assist heel raise Sititng heelraise/toe raise x 10      PATIENT EDUCATION:  Education details: HEP Person educated: Patient Education method: Consulting civil engineer, Verbal cues, and Handouts Education comprehension: verbalized understanding and returned demonstration  HOME EXERCISE PROGRAM Access Code: [email or web address] URL: [email or web address]  Date: 10/03/2022 Prepared by: [place] with Counter Support  - 1 x daily - 7 x weekly - 1 sets - 10 reps - 5" hold - Mini Squat with Counter Support  - 1 x daily - 7 x weekly - 1 sets - 10 reps - 5" hold - Sit to Stand  - 1 x daily - 7 x weekly - 1 sets - 10 reps - Supine Single Knee to Chest Stretch  - 1 x daily - 7 x weekly - 1 sets - 3 reps - 30" hold - Supine Hamstring Stretch  - 1 x daily - 7 x weekly - 1 sets - 3 reps - 30" hold - Supine Bridge  - 2 x daily - 7 x weekly -  1 sets - 10 reps - 3 hold - Seated Ankle Dorsiflexion AROM  - 3 x daily - 7 x weekly - 1 sets - 10 reps - 5 holdCISE PROGRAM:  Date: 10/09/2022 Prepared by: Roseanne Reno Exercises - Tandem Stance in Corner  - 2 x daily - 7 x weekly - 1 sets - 3 reps - 30 sec hold - Sit to Stand  - 2 x daily - 7 x weekly - 1 sets - 10 reps  ASSESSMENT:  CLINICAL IMPRESSION: Reviewed goals and POC moving forward. Pt able to recall HEP and complete correctly with minimal cues.  Pt with most difficulty with standing heel and toe raises.  Began tandem static activity with lateral drift, max stability of 3" before needing UE assist.  Instructed to do this activity in a corner of his home for stability.  Began sit to stands to improve glute strength and added these to HEP. Pt will continue to benefit from skilled PT to address issues and maximize his functioning ability.  .   OBJECTIVE IMPAIRMENTS: Abnormal gait, decreased activity tolerance, difficulty walking, decreased strength, and pain.   ACTIVITY LIMITATIONS: carrying, lifting, standing, stairs, and locomotion level  PARTICIPATION LIMITATIONS: shopping, community activity, and yard work  PERSONAL FACTORS: Fitness, Time since onset of injury/illness/exacerbation, and 1-2 comorbidities: cancer, OA  are also affecting patient's functional outcome.   REHAB POTENTIAL: Fair    CLINICAL DECISION MAKING: Stable/uncomplicated  EVALUATION COMPLEXITY: Moderate   GOALS: Goals reviewed with patient? Yes  SHORT TERM GOALS: Target date: 10/24/22  PT to be I in HEP in order to be able to decrease his pain to no greater than a 2/10 Baseline: Goal status: IN PROGRESS  2.  PT strength to be increased 1/2 grade to be able to go up and down 4 steps in a reciprocal manner  Baseline:  Goal status: IN PROGRESS  3.  Pt balance to be improved to be able to single leg stance for 5 seconds to decrease risk of falling  Baseline:  Goal status: IN PROGRESS    LONG  TERM GOALS:  Target date: 11/14/22  PT to be I in an advanced HEP in order to be able to be able to walk for 30 minutes. Baseline:  Goal status: IN PROGRESS  2.    PT strength to be increased 1 grade to be able to come sit to stand from a couch without difficulty  Baseline:  Goal status: IN PROGRESS  3.  Pt balance to be improved to be able to single leg stance for 10 seconds to reduce risk of falling  Baseline:  Goal status: IN PROGRESS  4.  PT to be able to put on his own shoes and socks. Baseline:  Goal status: IN PROGRESS  5.  PT to be able to get down on his hands and knees I and return to upright position  Baseline:  Goal status: IN PROGRESS  6.  PT to be able to walk for a mile without difficulty.  Baseline:  Goal status: IN PROGRESS  PLAN:  PT FREQUENCY: 2x/week  PT DURATION: 6 weeks  PLANNED INTERVENTIONS: Therapeutic exercises, Therapeutic activity, Neuromuscular re-education, Balance training, Gait training, Patient/Family education, and Self Care.  PLAN FOR NEXT SESSION: Strengthening and balance exercises to improve functional ability.  Teena Irani, PTA/CLT [place] Ph: [phone]   Teena Irani, PTA 10/09/2022, 5:06 PM

## 2022-10-10 DIAGNOSIS — M9902 Segmental and somatic dysfunction of thoracic region: Secondary | ICD-10-CM | POA: Diagnosis not present

## 2022-10-10 DIAGNOSIS — M9903 Segmental and somatic dysfunction of lumbar region: Secondary | ICD-10-CM | POA: Diagnosis not present

## 2022-10-10 DIAGNOSIS — M5442 Lumbago with sciatica, left side: Secondary | ICD-10-CM | POA: Diagnosis not present

## 2022-10-10 DIAGNOSIS — M9905 Segmental and somatic dysfunction of pelvic region: Secondary | ICD-10-CM | POA: Diagnosis not present

## 2022-10-10 LAB — CBC WITH DIFFERENTIAL/PLATELET
Basophils Absolute: 0 10*3/uL (ref 0.0–0.2)
Basos: 0 %
EOS (ABSOLUTE): 0.1 10*3/uL (ref 0.0–0.4)
Eos: 2 %
Hematocrit: 32.7 % — ABNORMAL LOW (ref 37.5–51.0)
Hemoglobin: 9.3 g/dL — ABNORMAL LOW (ref 13.0–17.7)
Immature Grans (Abs): 0 10*3/uL (ref 0.0–0.1)
Immature Granulocytes: 0 %
Lymphocytes Absolute: 1.4 10*3/uL (ref 0.7–3.1)
Lymphs: 28 %
MCH: 20.6 pg — ABNORMAL LOW (ref 26.6–33.0)
MCHC: 28.4 g/dL — ABNORMAL LOW (ref 31.5–35.7)
MCV: 73 fL — ABNORMAL LOW (ref 79–97)
Monocytes Absolute: 0.5 10*3/uL (ref 0.1–0.9)
Monocytes: 9 %
Neutrophils Absolute: 3.2 10*3/uL (ref 1.4–7.0)
Neutrophils: 61 %
Platelets: 268 10*3/uL (ref 150–450)
RBC: 4.51 x10E6/uL (ref 4.14–5.80)
RDW: 26 % — ABNORMAL HIGH (ref 11.6–15.4)
WBC: 5.2 10*3/uL (ref 3.4–10.8)

## 2022-10-10 LAB — BMP8+EGFR
BUN/Creatinine Ratio: 18 (ref 10–24)
BUN: 13 mg/dL (ref 8–27)
CO2: 22 mmol/L (ref 20–29)
Calcium: 9.2 mg/dL (ref 8.6–10.2)
Chloride: 104 mmol/L (ref 96–106)
Creatinine, Ser: 0.72 mg/dL — ABNORMAL LOW (ref 0.76–1.27)
Glucose: 119 mg/dL — ABNORMAL HIGH (ref 70–99)
Potassium: 4.2 mmol/L (ref 3.5–5.2)
Sodium: 141 mmol/L (ref 134–144)
eGFR: 96 mL/min/{1.73_m2} (ref >59)

## 2022-10-10 LAB — FERRITIN: Ferritin: 115 ng/mL (ref 30–400)

## 2022-10-17 ENCOUNTER — Ambulatory Visit (HOSPITAL_COMMUNITY): Payer: Medicare PPO | Attending: Physical Medicine and Rehabilitation | Admitting: Physical Therapy

## 2022-10-17 DIAGNOSIS — Z9181 History of falling: Secondary | ICD-10-CM | POA: Diagnosis not present

## 2022-10-17 DIAGNOSIS — M6281 Muscle weakness (generalized): Secondary | ICD-10-CM

## 2022-10-17 DIAGNOSIS — M5459 Other low back pain: Secondary | ICD-10-CM

## 2022-10-17 NOTE — Therapy (Addendum)
OUTPATIENT PHYSICAL THERAPY TREATMENT   Patient Name: Russell [name] MRN: [id number] DOB:20-Aug-1945, 77 y.o., male Today's Date: 10/17/2022 PHYSICAL THERAPY DISCHARGE SUMMARY  Visits from Start of Care: 3  Current functional level related to goals / functional outcomes: Pt has HEP requests to be discharged.    Remaining deficits: PT only has had 3 visits and requested to be discharged.    Education / Equipment: HEP   Patient agrees to discharge. Patient goals were partially met. Patient is being discharged due to the patient's request.   END OF SESSION:  PT End of Session - 10/17/22 1132     Visit Number 3    Number of Visits 3   Date for PT Re-Evaluation 11/14/22    Authorization Type [employer]    Progress Note Due on Visit 10    PT Start Time 1120    PT Stop Time 1205    PT Time Calculation (min) 45 min    Activity Tolerance Patient tolerated treatment well    Behavior During Therapy [facility] for tasks assessed/performed              Past Medical History:  Diagnosis Date   Anemia 2019   Arachnoiditis    Arthritis    Bleeding internal hemorrhoids 09/10/2006   Qualifier: Diagnosis of  By: Erby Pian MD, Cornelius     Cancer [facility]) 2022   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HCC) 2011   Cerebellar infarct (HCC)    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   anterior cer disc and fusion with plates W0-9 and C4-5 at [facility]   Constipation    Encounter for general adult medical examination with abnormal findings 06/27/2022   Enlarged prostate    GERD (gastroesophageal reflux disease)    Hemorrhoids    History of kidney stones    passed stones   History of lumbar laminectomy 05/12/2020   open L2-L5 at [facility]   Hx of colonic polyp 05/29/2018   05/2018 cecal ssp   Lung nodule 02/2021   Maxillary sinus polyp    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   Prolapsed internal hemorrhoids, grade 3 09/10/2006   Qualifier: Diagnosis of  By: Erby Pian MD, Cornelius     Salivary gland enlargement    Sleep  apnea 10 years ago   declined CPAP   Stroke (HCC) 15-20 years ago   Past Surgical History:  Procedure Laterality Date   BIOPSY  09/28/2022   Procedure: BIOPSY;  Surgeon: Sherrilyn Rist, MD;  Location: [facility];  Service: Gastroenterology;;   BRONCHIAL BIOPSY  05/16/2021   Procedure: BRONCHIAL BIOPSIES;  Surgeon: Josephine Igo, DO;  Location: [name];  Service: Pulmonary;;   BRONCHIAL BRUSHINGS  05/16/2021   Procedure: BRONCHIAL BRUSHINGS;  Surgeon: Josephine Igo, DO;  Location: [name];  Service: Pulmonary;;   BRONCHIAL NEEDLE ASPIRATION BIOPSY  05/16/2021   Procedure: BRONCHIAL NEEDLE ASPIRATION BIOPSIES;  Surgeon: Josephine Igo, DO;  Location: [name];  Service: Pulmonary;;   CERVICAL SPINE SURGERY  04/10/2020   C3-C4 with pin at [facility]   COLONOSCOPY     COLONOSCOPY WITH PROPOFOL N/A 09/28/2022   Procedure: COLONOSCOPY WITH PROPOFOL;  Surgeon: Sherrilyn Rist, MD;  Location: [facility];  Service: Gastroenterology;  Laterality: N/A;   ESOPHAGOGASTRODUODENOSCOPY (EGD) WITH PROPOFOL N/A 09/28/2022   Procedure: ESOPHAGOGASTRODUODENOSCOPY (EGD) WITH PROPOFOL;  Surgeon: Sherrilyn Rist, MD;  Location: [facility];  Service: Gastroenterology;  Laterality: N/A;   FIDUCIAL MARKER PLACEMENT  05/16/2021  Procedure: FIDUCIAL MARKER PLACEMENT;  Surgeon: Josephine Igo, DO;  Location: [name];  Service: Pulmonary;;   HEMORRHOID BANDING     LUMBAR LAMINECTOMY  03/08/2010   L2-3, cauda equina syndrome   lumbar laminectomy  05/12/2020   [facility]   SPINE SURGERY     UPPER GI ENDOSCOPY     VIDEO BRONCHOSCOPY WITH ENDOBRONCHIAL NAVIGATION Left 05/16/2021   Procedure: VIDEO BRONCHOSCOPY WITH ENDOBRONCHIAL NAVIGATION;  Surgeon: Josephine Igo, DO;  Location: [name];  Service: Pulmonary;  Laterality: Left;  ION w/ fiducial placement   VIDEO BRONCHOSCOPY WITH RADIAL ENDOBRONCHIAL ULTRASOUND  05/16/2021   Procedure: VIDEO BRONCHOSCOPY WITH RADIAL ENDOBRONCHIAL  ULTRASOUND;  Surgeon: Josephine Igo, DO;  Location: [name];  Service: Pulmonary;;   wisdom teeth ext     Patient Active Problem List   Diagnosis Date Noted   Colonic mass 09/29/2022   IDA (iron deficiency anemia) 09/26/2022   Acute blood loss anemia 09/26/2022   History of cancer of lower lobe bronchus or lung - s/p left lower lobectomy at [facility] 09-07-2021 09/26/2022   Superficial vein thrombosis 09/24/2022   Neuropathic pain 08/31/2022   Neurogenic bladder 08/31/2022   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   Cough 07/20/2022   Encounter for general adult medical examination with abnormal findings 06/27/2022   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 05/23/2021   Former smoker 05/05/2021   Elevated PSA 02/16/2021   Erectile dysfunction due to arterial insufficiency 05/11/2020   Benign prostatic hyperplasia with urinary obstruction 04/11/2020   DDD (degenerative disc disease), cervical 06/11/2018   DDD (degenerative disc disease), lumbar 06/11/2018   History of kidney stones 06/11/2018   Cauda equina syndrome 06/11/2018   Primary osteoarthritis of left knee 06/11/2018   Primary osteoarthritis of both feet 06/11/2018   Plantar fasciitis 06/11/2018   Hx of colonic polyp 05/29/2018   Left lumbar radiculopathy 01/07/2018   Diastolic dysfunction 10/15/2016   Psoriasis 10/15/2016   GERD 10/02/2007   Prolapsed internal hemorrhoids, grade 2 and 3 09/10/2006   SLEEP APNEA 09/10/2006    PCP: Trena Platt  REFERRING PROVIDER: Genice Rouge, MD  REFERRING DIAG:  Diagnosis  G83.4 (ICD-10-CM) - Incomplete cauda equina syndrome (HCC)    Rationale for Evaluation and Treatment: Rehabilitation  THERAPY DIAG:  Difficulty in walking Muscle weakness Decreased balance  ONSET DATE: chronic                                                                                                                                                                         SUBJECTIVE STATEMENT: Pt states he feels some of the exercises are really helping while others are not beneficial.   Pt comes today requesting today be  his last visit.     Evaluation: Pt states that his main concern is balance and stamina.  He had a back surgery in 2021 and when he woke he noted numbness in his leg, he has been having problems ever since.  He states that he can sit for 30 minutes in comfort, he can stand for 15 minutes and he can walk for 15 minutes.  He normally uses a cane to walk with.   PERTINENT HISTORY:   Upcoming surgery to remove his colon cancer on 11/08/22 77 year old male history of stage Ia non-small cell lung cancer status post left lower lobe lobectomy in 2023, history of cauda equina syndrome status post laminectomy October 2021, history of internal hemorrhoids, history of degenerative disc disease, upcoming colon cancer surgery on 11/08/22. 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 Yes: NPRS scale: 0/10; worst pain in the past week 4 back and down his Rt leg to his knee.   PRECAUTIONS: None  WEIGHT BEARING RESTRICTIONS: No  FALLS:  Has patient fallen in last 6 months? Yes. Number of falls 4  LIVING ENVIRONMENT: Lives with: lives with their family Lives in: House/apartment Stairs: Yes: Internal: 15 steps; on right going up and External: 4 steps; on right going up; has an elevator that he uses inside and a ramp outside.  Has following equipment at home: Single point cane  OCCUPATION: retired  PLOF: Independent with community mobility with device  PATIENT GOALS: To walk better, improved balance, to be able to get on and off his knees I to work in his yard.   NEXT MD VISIT: not scheduled at this time   OBJECTIVE:   PATIENT SURVEYS:  FOTO 10/17/22:  53.4%  at evaluation: 44% functional  status    COGNITION: Overall cognitive status: Within functional limits for tasks assessed     SENSATION: Not tested  MUSCLE LENGTH: Hamstrings: Right 140 deg; Left 145 deg   POSTURE: rounded shoulders, forward head, decreased lumbar lordosis, decreased thoracic kyphosis, and flexed trunk   PALPATION: Tight paraspinal   LUMBAR ROM: not tested   AROM eval  Flexion   Extension   Right lateral flexion   Left lateral flexion   Right rotation   Left rotation    (Blank rows = not tested)  LE STRENGTH:  Strength Rt LE eval Lt LE eval Rt LE 10/17/22 Lt LE 10/17/22  Ankle dorsiflexion 2 3- 2 3-  Ankle plantar flexion 2 2 2 2   Knee flexion 5 3  4+  Knee extension 5 4  5   Hip Abduction 5 4+  4+  Hip extension 5 4  4+   FUNCTIONAL TESTS:  30 seconds chair stand test:  10/17/22:  pt deferred;   evaluation: 2 times 2 minute walk test: 10/17/22:  350 feet no AD;  at evaluation:  test next treatment.             Single leg stance: 10/17/22:   unable;   at evaluation:   Rt: 0  , Lt: 0   TODAY'S TREATMENT:  DATE:  10/17/22: Progress note MMT (see above) FOTO 53.4% functional status;   at evaluation: 44% functional status Goal review 2 MWT 350 feet no AD  10/09/22: Standing: heelraises 15X  Toeraises 15X (alt one at time)  Tandem stance each LE lead 3 trials each 20"  Sit to stands no UE 10X with cues to for control Supine:  Bridge 20X  SLR 10X each LE   11/03/22:  Supine:  Bridge x 5 Knee to chest 30" hold x 1 Active hamstring stretch hold 30" x 1 Prone : LT hamstring curl x 5 Standing: UE assist heel raise Sititng heelraise/toe raise x 10      PATIENT EDUCATION:  Education details: HEP Person educated: Patient Education method: Programmer, multimediaxplanation, Verbal cues, and Handouts Education comprehension: verbalized understanding and returned  demonstration  HOME EXERCISE PROGRAM Access Code: [email or web address] URL: [email or web address]  Date: 10/03/2022 Prepared by: Virgina Organynthia Russell Exercises - Heel Raises with Counter Support  - 1 x daily - 7 x weekly - 1 sets - 10 reps - 5" hold - Mini Squat with Counter Support  - 1 x daily - 7 x weekly - 1 sets - 10 reps - 5" hold - Sit to Stand  - 1 x daily - 7 x weekly - 1 sets - 10 reps - Supine Single Knee to Chest Stretch  - 1 x daily - 7 x weekly - 1 sets - 3 reps - 30" hold - Supine Hamstring Stretch  - 1 x daily - 7 x weekly - 1 sets - 3 reps - 30" hold - Supine Bridge  - 2 x daily - 7 x weekly - 1 sets - 10 reps - 3 hold - Seated Ankle Dorsiflexion AROM  - 3 x daily - 7 x weekly - 1 sets - 10 reps - 5 holdCISE PROGRAM:  Date: 10/09/2022 Prepared by: Emeline GinsAmy Salim Forero Exercises - Tandem Stance in Corner  - 2 x daily - 7 x weekly - 1 sets - 3 reps - 30 sec hold - Sit to Stand  - 2 x daily - 7 x weekly - 1 sets - 10 reps  ASSESSMENT:  CLINICAL IMPRESSION: Pt returns today requesting to be discharged from therapy.  Discussed those exercises that are helpful and those that seem to exacerbate condition.  Pt refuses to do activities which he feels worsens or aggregates his hamstrings and cauda equina.   Pt reports he can walk up to a mile and does so on a regular basis.  Pt declines completing stairs or hamstring stretch as this will set him back for days. 2 MWT completed with average distance completed without use of AD.  MMT reveals slightly improved mm strength for Lt LE musculature.  Goals reviewed with 2/3 STG's met and 4/6 LTG's met.  Balance remains challenging with inability to maintain static one leg stance on either LE, however continues to practice tandem in corner of elevator.  Pt without any further questions or concerns regarding his HEP or activities.  Pt would like to discharged per request at this time due to having to care for his wife and upcoming cancer surgery.   .    OBJECTIVE IMPAIRMENTS: Abnormal gait, decreased activity tolerance, difficulty walking, decreased strength, and pain.   ACTIVITY LIMITATIONS: carrying, lifting, standing, stairs, and locomotion level  PARTICIPATION LIMITATIONS: shopping, community activity, and yard work  PERSONAL FACTORS: Fitness, Time since onset of injury/illness/exacerbation, and 1-2 comorbidities: cancer, OA  are also affecting patient's functional outcome.  REHAB POTENTIAL: Fair    CLINICAL DECISION MAKING: Stable/uncomplicated  EVALUATION COMPLEXITY: Moderate   GOALS: Goals reviewed with patient? Yes  SHORT TERM GOALS: Target date: 10/24/22  PT to be I in HEP in order to be able to decrease his pain to no greater than a 2/10 Baseline: Goal status: MET  2.  PT strength to be increased 1/2 grade to be able to go up and down 4 steps in a reciprocal manner  Baseline:  Goal status: MET  3.  Pt balance to be improved to be able to single leg stance for 5 seconds to decrease risk of falling  Baseline:  Goal status: NOT MET    LONG TERM GOALS: Target date: 11/14/22  PT to be I in an advanced HEP in order to be able to be able to walk for 30 minutes. Baseline:  Goal status: MET  2.    PT strength to be increased 1 grade to be able to come sit to stand from a couch without difficulty  Baseline:  Goal status: NOT MET  3.  Pt balance to be improved to be able to single leg stance for 10 seconds to reduce risk of falling  Baseline:  Goal status: NOT MET  4.  PT to be able to put on his own shoes and socks. Baseline:  Goal status: MET  5.  PT to be able to get down on his hands and knees I and return to upright position  Baseline:  Goal status: MET  6.  PT to be able to walk for a mile without difficulty.  Baseline:  Goal status: MET  PLAN:  PT FREQUENCY: 2x/week  PT DURATION: 6 weeks  PLANNED INTERVENTIONS: Therapeutic exercises, Therapeutic activity, Neuromuscular re-education, Balance  training, Gait training, Patient/Family education, and Self Care.  PLAN FOR NEXT SESSION: Discharge to HEP per patient request  Lurena Nida, PTA/CLT [facility] Ph: [phone]  Virgina Organ, PT CLT [phone]  10/17/2022, 12:41 PM

## 2022-10-19 ENCOUNTER — Ambulatory Visit: Payer: Self-pay | Admitting: General Surgery

## 2022-10-19 NOTE — Progress Notes (Signed)
Sent message, via epic in basket, requesting orders in epic from surgeon.  

## 2022-10-23 ENCOUNTER — Encounter (HOSPITAL_COMMUNITY): Payer: Medicare PPO | Admitting: Physical Therapy

## 2022-10-23 NOTE — Patient Instructions (Addendum)
SURGICAL WAITING ROOM VISITATION  Patients having surgery or a procedure may have no more than 2 support people in the waiting area - these visitors may rotate.    Children under the age of 6 must have an adult with them who is not the patient.  Due to an increase in RSV and influenza rates and associated hospitalizations, children ages 86 and under may not visit patients in [facility] hospitals.  If the patient needs to stay at the hospital during part of their recovery, the visitor guidelines for inpatient rooms apply. Pre-op nurse will coordinate an appropriate time for 1 support person to accompany patient in pre-op.  This support person may not rotate.    Please refer to the [facility] website for the visitor guidelines for Inpatients (after your surgery is over and you are in a regular room).    Your procedure is scheduled on: 11/08/22   Report to [facility] Main Entrance    Report to admitting at 6:15 AM   Call this number if you have problems the morning of surgery [phone]   Follow a clear liquid diet the day before surgery.   You may have the following liquids until 5:45 AM DAY OF SURGERY  Water Non-Citrus Juices (without pulp, NO RED-Apple, White grape, White cranberry) Black Coffee (NO MILK/CREAM OR CREAMERS, sugar ok)  Clear Tea (NO MILK/CREAM OR CREAMERS, sugar ok) regular and decaf                             Plain Jell-O (NO RED)                                           Fruit ices (not with fruit pulp, NO RED)                                     Popsicles (NO RED)                                                               Sports drinks like Gatorade (NO RED)              Drink 2 Ensure drinks AT 10:00 PM the night before surgery.        The day of surgery:  Drink ONE (1) Pre-Surgery Clear Ensure at 5:45 AM the morning of surgery. Drink in one sitting. Do not sip.  This drink was given to you during your hospital  pre-op appointment  visit. Nothing else to drink after completing the  Pre-Surgery Clear Ensure.          If you have questions, please contact your surgeon's office.   FOLLOW BOWEL PREP AND ANY ADDITIONAL PRE OP INSTRUCTIONS YOU RECEIVED FROM YOUR SURGEON'S OFFICE!!!     Oral Hygiene is also important to reduce your risk of infection.  Remember - BRUSH YOUR TEETH THE MORNING OF SURGERY WITH YOUR REGULAR TOOTHPASTE  DENTURES WILL BE REMOVED PRIOR TO SURGERY PLEASE DO NOT APPLY "Poly grip" OR ADHESIVES!!!   Take these medicines the morning of surgery with A SIP OF WATER: Gabapentin, Metoprolol, Omeprazole                               You may not have any metal on your body including jewelry, and body piercing             Do not wear lotions, powders, cologne, or deodorant  Do not shave  48 hours prior to surgery.               Men may shave face and neck.   Do not bring valuables to the hospital. [name] IS NOT             RESPONSIBLE   FOR VALUABLES.   Contacts, glasses, dentures or bridgework may not be worn into surgery.   Bring small overnight bag day of surgery.   DO NOT BRING YOUR HOME MEDICATIONS TO THE HOSPITAL. PHARMACY WILL DISPENSE MEDICATIONS LISTED ON YOUR MEDICATION LIST TO YOU DURING YOUR ADMISSION IN THE HOSPITAL!              Please read over the following fact sheets you were given: IF YOU HAVE QUESTIONS ABOUT YOUR PRE-OP INSTRUCTIONS PLEASE CALL [name]    If you received a COVID test during your pre-op visit  it is requested that you wear a mask when out in public, stay away from anyone that may not be feeling well and notify your surgeon if you develop symptoms. If you test positive for Covid or have been in contact with anyone that has tested positive in the last 10 days please notify you surgeon.    [name] - Preparing for Surgery Before surgery, you can play an important role.  Because skin is not sterile, your skin  needs to be as free of germs as possible.  You can reduce the number of germs on your skin by washing with CHG (chlorahexidine gluconate) soap before surgery.  CHG is an antiseptic cleaner which kills germs and bonds with the skin to continue killing germs even after washing. Please DO NOT use if you have an allergy to CHG or antibacterial soaps.  If your skin becomes reddened/irritated stop using the CHG and inform your nurse when you arrive at Short Stay. Do not shave (including legs and underarms) for at least 48 hours prior to the first CHG shower.  You may shave your face/neck.  Please follow these instructions carefully:  1.  Shower with CHG Soap the night before surgery and the  morning of surgery.  2.  If you choose to wash your hair, wash your hair first as usual with your normal  shampoo.  3.  After you shampoo, rinse your hair and body thoroughly to remove the shampoo.                             4.  Use CHG as you would any other liquid soap.  You can apply chg directly to the skin and wash.  Gently with a scrungie or clean washcloth.  5.  Apply the CHG Soap to your body ONLY FROM THE NECK DOWN.   Do   not use on  face/ open                           Wound or open sores. Avoid contact with eyes, ears mouth and   genitals (private parts).                       Wash face,  Genitals (private parts) with your normal soap.             6.  Wash thoroughly, paying special attention to the area where your    surgery  will be performed.  7.  Thoroughly rinse your body with warm water from the neck down.  8.  DO NOT shower/wash with your normal soap after using and rinsing off the CHG Soap.                9.  Pat yourself dry with a clean towel.            10.  Wear clean pajamas.            11.  Place clean sheets on your bed the night of your first shower and do not  sleep with pets. Day of Surgery : Do not apply any lotions/deodorants the morning of surgery.  Please wear clean clothes to the  hospital/surgery center.  FAILURE TO FOLLOW THESE INSTRUCTIONS MAY RESULT IN THE CANCELLATION OF YOUR SURGERY  PATIENT SIGNATURE_________________________________  NURSE SIGNATURE__________________________________  ________________________________________________________________________  Russell MireIncentive Spirometer  An incentive spirometer is a tool that can help keep your lungs clear and active. This tool measures how well you are filling your lungs with each breath. Taking long deep breaths may help reverse or decrease the chance of developing breathing (pulmonary) problems (especially infection) following: A long period of time when you are unable to move or be active. BEFORE THE PROCEDURE  If the spirometer includes an indicator to show your best effort, your nurse or respiratory therapist will set it to a desired goal. If possible, sit up straight or lean slightly forward. Try not to slouch. Hold the incentive spirometer in an upright position. INSTRUCTIONS FOR USE  Sit on the edge of your bed if possible, or sit up as far as you can in bed or on a chair. Hold the incentive spirometer in an upright position. Breathe out normally. Place the mouthpiece in your mouth and seal your lips tightly around it. Breathe in slowly and as deeply as possible, raising the piston or the ball toward the top of the column. Hold your breath for 3-5 seconds or for as long as possible. Allow the piston or ball to fall to the bottom of the column. Remove the mouthpiece from your mouth and breathe out normally. Rest for a few seconds and repeat Steps 1 through 7 at least 10 times every 1-2 hours when you are awake. Take your time and take a few normal breaths between deep breaths. The spirometer may include an indicator to show your best effort. Use the indicator as a goal to work toward during each repetition. After each set of 10 deep breaths, practice coughing to be sure your lungs are clear. If you have an  incision (the cut made at the time of surgery), support your incision when coughing by placing a pillow or rolled up towels firmly against it. Once you are able to get out of bed, walk around indoors and cough well. You may stop using the incentive  spirometer when instructed by your caregiver.  RISKS AND COMPLICATIONS Take your time so you do not get dizzy or light-headed. If you are in pain, you may need to take or ask for pain medication before doing incentive spirometry. It is harder to take a deep breath if you are having pain. AFTER USE Rest and breathe slowly and easily. It can be helpful to keep track of a log of your progress. Your caregiver can provide you with a simple table to help with this. If you are using the spirometer at home, follow these instructions: SEEK MEDICAL CARE IF:  You are having difficultly using the spirometer. You have trouble using the spirometer as often as instructed. Your pain medication is not giving enough relief while using the spirometer. You develop fever of 100.5 F (38.1 C) or higher. SEEK IMMEDIATE MEDICAL CARE IF:  You cough up bloody sputum that had not been present before. You develop fever of 102 F (38.9 C) or greater. You develop worsening pain at or near the incision site. MAKE SURE YOU:  Understand these instructions. Will watch your condition. Will get help right away if you are not doing well or get worse. Document Released: 11/12/2006 Document Revised: 09/24/2011 Document Reviewed: 01/13/2007 Exit[employer] Patient Information 2014 Exit[employer], [place].   ________________________________________________________________________ WHAT IS A BLOOD TRANSFUSION? Blood Transfusion Information  A transfusion is the replacement of blood or some of its parts. Blood is made up of multiple cells which provide different functions. Red blood cells carry oxygen and are used for blood loss replacement. White blood cells fight against infection. Platelets  control bleeding. Plasma helps clot blood. Other blood products are available for specialized needs, such as hemophilia or other clotting disorders. BEFORE THE TRANSFUSION  Who gives blood for transfusions?  Healthy volunteers who are fully evaluated to make sure their blood is safe. This is blood bank blood. Transfusion therapy is the safest it has ever been in the practice of medicine. Before blood is taken from a donor, a complete history is taken to make sure that person has no history of diseases nor engages in risky social behavior (examples are intravenous drug use or sexual activity with multiple partners). The donor's travel history is screened to minimize risk of transmitting infections, such as malaria. The donated blood is tested for signs of infectious diseases, such as HIV and hepatitis. The blood is then tested to be sure it is compatible with you in order to minimize the chance of a transfusion reaction. If you or a relative donates blood, this is often done in anticipation of surgery and is not appropriate for emergency situations. It takes many days to process the donated blood. RISKS AND COMPLICATIONS Although transfusion therapy is very safe and saves many lives, the main dangers of transfusion include:  Getting an infectious disease. Developing a transfusion reaction. This is an allergic reaction to something in the blood you were given. Every precaution is taken to prevent this. The decision to have a blood transfusion has been considered carefully by your caregiver before blood is given. Blood is not given unless the benefits outweigh the risks. AFTER THE TRANSFUSION Right after receiving a blood transfusion, you will usually feel much better and more energetic. This is especially true if your red blood cells have gotten low (anemic). The transfusion raises the level of the red blood cells which carry oxygen, and this usually causes an energy increase. The nurse administering the  transfusion will monitor you carefully for complications. HOME  CARE INSTRUCTIONS  No special instructions are needed after a transfusion. You may find your energy is better. Speak with your caregiver about any limitations on activity for underlying diseases you may have. SEEK MEDICAL CARE IF:  Your condition is not improving after your transfusion. You develop redness or irritation at the intravenous (IV) site. SEEK IMMEDIATE MEDICAL CARE IF:  Any of the following symptoms occur over the next 12 hours: Shaking chills. You have a temperature by mouth above 102 F (38.9 C), not controlled by medicine. Chest, back, or muscle pain. People around you feel you are not acting correctly or are confused. Shortness of breath or difficulty breathing. Dizziness and fainting. You get a rash or develop hives. You have a decrease in urine output. Your urine turns a dark color or changes to pink, red, or brown. Any of the following symptoms occur over the next 10 days: You have a temperature by mouth above 102 F (38.9 C), not controlled by medicine. Shortness of breath. Weakness after normal activity. The white part of the eye turns yellow (jaundice). You have a decrease in the amount of urine or are urinating less often. Your urine turns a dark color or changes to pink, red, or brown. Document Released: 06/29/2000 Document Revised: 09/24/2011 Document Reviewed: 02/16/2008 [facility] Patient Information 2014 [place], [place].  _______________________________________________________________________

## 2022-10-23 NOTE — Progress Notes (Addendum)
COVID Vaccine Completed: yes  Date of COVID positive in last 90 days: no  PCP - Trena Platt, MD Cardiologist - [name], MD LOV 08/07/22 Oncologist- [facility]  Dr Jonelle Sidle for physical   Heart monitor- results 07/17/22 Epic Chest x-ray - CT 04/27/22 [facility] EKG - 08/07/22 Epic Stress Test - n/a ECHO - 06/22/22 Epic Cardiac Cath - n/a Pacemaker/ICD device last checked: n/a Spinal Cord Stimulator: n/a  Bowel Prep - yes, pt has instructions. Will pick up supplies today  Sleep Study - yes CPAP - no  Fasting Blood Sugar - n/a Checks Blood Sugar _____ times a day  Last dose of GLP1 agonist-  N/A GLP1 instructions:  N/A   Last dose of SGLT-2 inhibitors-  N/A SGLT-2 instructions: N/A   Blood Thinner Instructions: Eliquis on hold due to anemia  Aspirin Instructions: ASA 81. Continue per Dr. Maisie Fus Last Dose:  Activity level: Can go up a flight of stairs and perform activities of daily living without stopping and without symptoms of chest pain or shortness of breath.   Anesthesia review: PVCs, OSA, anemia, lung cancer, mild aortic sclerosis, mild pulmonary fibrosis, SVT,   Patient denies shortness of breath, fever, cough and chest pain at PAT appointment  Patient verbalized understanding of instructions that were given to them at the PAT appointment. Patient was also instructed that they will need to review over the PAT instructions again at home before surgery.

## 2022-10-25 ENCOUNTER — Encounter (HOSPITAL_COMMUNITY): Payer: Medicare PPO | Admitting: Physical Therapy

## 2022-10-26 ENCOUNTER — Encounter (HOSPITAL_COMMUNITY): Payer: Self-pay

## 2022-10-26 ENCOUNTER — Other Ambulatory Visit: Payer: Self-pay

## 2022-10-26 ENCOUNTER — Encounter (HOSPITAL_COMMUNITY)
Admission: RE | Admit: 2022-10-26 | Discharge: 2022-10-26 | Disposition: A | Discharge status: Home / Self Care | Payer: Medicare PPO | Source: Ambulatory Visit | Attending: Internal Medicine | Admitting: Internal Medicine

## 2022-10-26 VITALS — BP 127/81 | HR 68 | Temp 98.2°F | Resp 16 | Ht 72.0 in | Wt 194.0 lb

## 2022-10-26 DIAGNOSIS — C3432 Malignant neoplasm of lower lobe, left bronchus or lung: Secondary | ICD-10-CM

## 2022-10-26 DIAGNOSIS — Z01818 Encounter for other preprocedural examination: Secondary | ICD-10-CM

## 2022-10-26 DIAGNOSIS — Z01812 Encounter for preprocedural laboratory examination: Secondary | ICD-10-CM | POA: Insufficient documentation

## 2022-10-26 DIAGNOSIS — G039 Meningitis, unspecified: Secondary | ICD-10-CM

## 2022-10-26 DIAGNOSIS — D649 Anemia, unspecified: Secondary | ICD-10-CM | POA: Diagnosis not present

## 2022-10-26 HISTORY — DX: Pneumonia, unspecified organism: J18.9

## 2022-10-26 LAB — CBC
HCT: 33.6 % — ABNORMAL LOW (ref 39.0–52.0)
Hemoglobin: 10 g/dL — ABNORMAL LOW (ref 13.0–17.0)
MCH: 20.7 pg — ABNORMAL LOW (ref 26.0–34.0)
MCHC: 29.8 g/dL — ABNORMAL LOW (ref 30.0–36.0)
MCV: 69.7 fL — ABNORMAL LOW (ref 80.0–100.0)
Platelets: 268 10*3/uL (ref 150–400)
RBC: 4.82 MIL/uL (ref 4.22–5.81)
RDW: 28.1 % — ABNORMAL HIGH (ref 11.5–15.5)
WBC: 5.3 10*3/uL (ref 4.0–10.5)
nRBC: 0 % (ref 0.0–0.2)

## 2022-10-26 LAB — BASIC METABOLIC PANEL
Anion gap: 7 (ref 5–15)
BUN: 18 mg/dL (ref 8–23)
CO2: 26 mmol/L (ref 22–32)
Calcium: 9 mg/dL (ref 8.9–10.3)
Chloride: 105 mmol/L (ref 98–111)
Creatinine, Ser: 0.77 mg/dL (ref 0.61–1.24)
GFR, Estimated: >60 mL/min (ref >60)
Glucose, Bld: 101 mg/dL — ABNORMAL HIGH (ref 70–99)
Potassium: 4.3 mmol/L (ref 3.5–5.1)
Sodium: 138 mmol/L (ref 135–145)

## 2022-10-30 ENCOUNTER — Ambulatory Visit: Payer: Medicare PPO | Admitting: Internal Medicine

## 2022-10-30 ENCOUNTER — Encounter (HOSPITAL_COMMUNITY): Payer: Medicare PPO | Admitting: Physical Therapy

## 2022-11-01 ENCOUNTER — Encounter (HOSPITAL_COMMUNITY): Payer: Medicare PPO | Admitting: Physical Therapy

## 2022-11-02 DIAGNOSIS — J479 Bronchiectasis, uncomplicated: Secondary | ICD-10-CM | POA: Diagnosis not present

## 2022-11-02 DIAGNOSIS — C3432 Malignant neoplasm of lower lobe, left bronchus or lung: Secondary | ICD-10-CM | POA: Diagnosis not present

## 2022-11-02 DIAGNOSIS — Z902 Acquired absence of lung [part of]: Secondary | ICD-10-CM | POA: Diagnosis not present

## 2022-11-07 NOTE — Anesthesia Preprocedure Evaluation (Signed)
Anesthesia Evaluation  Patient identified by MRN, date of birth, ID band Patient awake    Reviewed: Allergy & Precautions, NPO status , Patient's Chart, lab work & pertinent test results  Airway Mallampati: I  TM Distance: >3 FB Neck ROM: Full    Dental no notable dental hx. (+) Teeth Intact, Dental Advisory Given, Implants   Pulmonary sleep apnea , former smoker   Pulmonary exam normal breath sounds clear to auscultation       Cardiovascular hypertension, Normal cardiovascular exam+ Valvular Problems/Murmurs (mild) AI  Rhythm:Regular Rate:Normal  06/2022 TTE 1. Left ventricular ejection fraction, by estimation, is 60 to 65%. The  left ventricle has normal function     Neuro/Psych CVA, No Residual Symptoms    GI/Hepatic Neg liver ROS,GERD  Medicated and Controlled,,  Endo/Other  negative endocrine ROS    Renal/GU Lab Results      Component                Value               Date                      CREATININE               0.77                10/26/2022                BUN                      18                  10/26/2022                NA                       138                 10/26/2022                K                        4.3                 10/26/2022                CL                       105                 10/26/2022                CO2                      26                  10/26/2022                Musculoskeletal  (+) Arthritis ,    Abdominal   Peds  Hematology  (+) Blood dyscrasia, anemia Lab Results      Component                Value               Date  WBC                      5.3                 10/26/2022                HGB                      10.0 (L)            10/26/2022                HCT                      33.6 (L)            10/26/2022                MCV                      69.7 (L)            10/26/2022                PLT                      268                  10/26/2022              Anesthesia Other Findings   Reproductive/Obstetrics                             Anesthesia Physical Anesthesia Plan  ASA: 3  Anesthesia Plan: General   Post-op Pain Management: Lidocaine infusion*, Ketamine IV* and Ofirmev IV (intra-op)*   Induction: Intravenous  PONV Risk Score and Plan: Treatment may vary due to age or medical condition  Airway Management Planned: Oral ETT  Additional Equipment: None  Intra-op Plan:   Post-operative Plan: Extubation in OR  Informed Consent: I have reviewed the patients History and Physical, chart, labs and discussed the procedure including the risks, benefits and alternatives for the proposed anesthesia with the patient or authorized representative who has indicated his/her understanding and acceptance.     Dental advisory given  Plan Discussed with: CRNA and Anesthesiologist  Anesthesia Plan Comments:         Anesthesia Quick Evaluation

## 2022-11-08 ENCOUNTER — Inpatient Hospital Stay (HOSPITAL_COMMUNITY): Payer: Medicare PPO | Admitting: Physician Assistant

## 2022-11-08 ENCOUNTER — Other Ambulatory Visit: Payer: Self-pay

## 2022-11-08 ENCOUNTER — Inpatient Hospital Stay (HOSPITAL_COMMUNITY): Payer: Medicare PPO | Admitting: Anesthesiology

## 2022-11-08 ENCOUNTER — Encounter (HOSPITAL_COMMUNITY): Payer: Self-pay | Admitting: General Surgery

## 2022-11-08 ENCOUNTER — Encounter (HOSPITAL_COMMUNITY)
Admission: RE | Disposition: A | Discharge status: Home / Self Care | Payer: Self-pay | Source: Home / Self Care | Attending: General Surgery

## 2022-11-08 ENCOUNTER — Inpatient Hospital Stay (HOSPITAL_COMMUNITY)
Admission: RE | Admit: 2022-11-08 | Discharge: 2022-11-10 | DRG: 331 | Disposition: A | Discharge status: Home / Self Care | Payer: Medicare PPO | Attending: Surgery | Admitting: Surgery

## 2022-11-08 DIAGNOSIS — Z823 Family history of stroke: Secondary | ICD-10-CM

## 2022-11-08 DIAGNOSIS — Z01818 Encounter for other preprocedural examination: Secondary | ICD-10-CM

## 2022-11-08 DIAGNOSIS — C189 Malignant neoplasm of colon, unspecified: Secondary | ICD-10-CM | POA: Diagnosis not present

## 2022-11-08 DIAGNOSIS — Z86010 Personal history of colonic polyps: Secondary | ICD-10-CM

## 2022-11-08 DIAGNOSIS — Z8261 Family history of arthritis: Secondary | ICD-10-CM | POA: Diagnosis not present

## 2022-11-08 DIAGNOSIS — Z7901 Long term (current) use of anticoagulants: Secondary | ICD-10-CM | POA: Diagnosis not present

## 2022-11-08 DIAGNOSIS — Z902 Acquired absence of lung [part of]: Secondary | ICD-10-CM | POA: Diagnosis not present

## 2022-11-08 DIAGNOSIS — Z8672 Personal history of thrombophlebitis: Secondary | ICD-10-CM | POA: Diagnosis not present

## 2022-11-08 DIAGNOSIS — Z87891 Personal history of nicotine dependence: Secondary | ICD-10-CM

## 2022-11-08 DIAGNOSIS — Z85118 Personal history of other malignant neoplasm of bronchus and lung: Secondary | ICD-10-CM

## 2022-11-08 DIAGNOSIS — G629 Polyneuropathy, unspecified: Secondary | ICD-10-CM | POA: Diagnosis present

## 2022-11-08 DIAGNOSIS — G4730 Sleep apnea, unspecified: Secondary | ICD-10-CM | POA: Diagnosis not present

## 2022-11-08 DIAGNOSIS — K648 Other hemorrhoids: Secondary | ICD-10-CM | POA: Diagnosis present

## 2022-11-08 DIAGNOSIS — R195 Other fecal abnormalities: Secondary | ICD-10-CM | POA: Diagnosis present

## 2022-11-08 DIAGNOSIS — D630 Anemia in neoplastic disease: Secondary | ICD-10-CM | POA: Diagnosis not present

## 2022-11-08 DIAGNOSIS — D62 Acute posthemorrhagic anemia: Secondary | ICD-10-CM | POA: Diagnosis not present

## 2022-11-08 DIAGNOSIS — Z8249 Family history of ischemic heart disease and other diseases of the circulatory system: Secondary | ICD-10-CM

## 2022-11-08 DIAGNOSIS — C182 Malignant neoplasm of ascending colon: Secondary | ICD-10-CM | POA: Diagnosis present

## 2022-11-08 DIAGNOSIS — I10 Essential (primary) hypertension: Secondary | ICD-10-CM | POA: Diagnosis not present

## 2022-11-08 DIAGNOSIS — K5909 Other constipation: Secondary | ICD-10-CM | POA: Diagnosis not present

## 2022-11-08 DIAGNOSIS — Z8673 Personal history of transient ischemic attack (TIA), and cerebral infarction without residual deficits: Secondary | ICD-10-CM | POA: Diagnosis not present

## 2022-11-08 DIAGNOSIS — K219 Gastro-esophageal reflux disease without esophagitis: Secondary | ICD-10-CM | POA: Diagnosis not present

## 2022-11-08 DIAGNOSIS — G4733 Obstructive sleep apnea (adult) (pediatric): Secondary | ICD-10-CM | POA: Diagnosis present

## 2022-11-08 LAB — TYPE AND SCREEN
ABO/RH(D): A POS
Antibody Screen: NEGATIVE

## 2022-11-08 SURGERY — COLECTOMY, PARTIAL, ROBOT-ASSISTED, LAPAROSCOPIC
Anesthesia: General

## 2022-11-08 MED ORDER — BUPIVACAINE HCL (PF) 0.25 % IJ SOLN
INTRAMUSCULAR | Status: AC
  Filled 2022-11-08: qty 30

## 2022-11-08 MED ORDER — PHENYLEPHRINE HCL-NACL 20-0.9 MG/250ML-% IV SOLN
INTRAVENOUS | Status: DC | PRN
  Administered 2022-11-08: 30 ug/min via INTRAVENOUS

## 2022-11-08 MED ORDER — BISACODYL 5 MG PO TBEC: 20.0000 mg | DELAYED_RELEASE_TABLET | Freq: Once | ORAL | Status: DC

## 2022-11-08 MED ORDER — SODIUM CHLORIDE (PF) 0.9 % IJ SOLN
INTRAMUSCULAR | Status: AC
  Filled 2022-11-08: qty 10

## 2022-11-08 MED ORDER — SODIUM CHLORIDE 0.9 % IV SOLN
2.0000 g | Freq: Two times a day (BID) | INTRAVENOUS | Status: AC
  Administered 2022-11-08: 2 g via INTRAVENOUS
  Filled 2022-11-08: qty 2

## 2022-11-08 MED ORDER — BUPIVACAINE HCL (PF) 0.25 % IJ SOLN
INTRAMUSCULAR | Status: DC | PRN
  Administered 2022-11-08: 30 mL

## 2022-11-08 MED ORDER — TRAMADOL HCL 50 MG PO TABS
50.0000 mg | ORAL_TABLET | Freq: Four times a day (QID) | ORAL | Status: DC | PRN
  Administered 2022-11-08 – 2022-11-09 (×2): 50 mg via ORAL
  Filled 2022-11-08 (×2): qty 1

## 2022-11-08 MED ORDER — HYDROMORPHONE HCL 2 MG/ML IJ SOLN
INTRAMUSCULAR | Status: AC
  Filled 2022-11-08: qty 1

## 2022-11-08 MED ORDER — FINASTERIDE 5 MG PO TABS
5.0000 mg | ORAL_TABLET | Freq: Every day | ORAL | Status: DC
  Administered 2022-11-08 – 2022-11-09 (×2): 5 mg via ORAL
  Filled 2022-11-08 (×2): qty 1

## 2022-11-08 MED ORDER — MIDAZOLAM HCL 5 MG/5ML IJ SOLN
INTRAMUSCULAR | Status: DC | PRN
  Administered 2022-11-08 (×2): 1 mg via INTRAVENOUS

## 2022-11-08 MED ORDER — ENSURE SURGERY PO LIQD
237.0000 mL | Freq: Two times a day (BID) | ORAL | Status: DC
  Administered 2022-11-08 – 2022-11-10 (×2): 237 mL via ORAL

## 2022-11-08 MED ORDER — PHENYLEPHRINE HCL (PRESSORS) 10 MG/ML IV SOLN
INTRAVENOUS | Status: AC
  Filled 2022-11-08: qty 1

## 2022-11-08 MED ORDER — BUPIVACAINE LIPOSOME 1.3 % IJ SUSP: 20.0000 mL | Freq: Once | INTRAMUSCULAR | Status: DC

## 2022-11-08 MED ORDER — FENTANYL CITRATE (PF) 250 MCG/5ML IJ SOLN
INTRAMUSCULAR | Status: AC
  Filled 2022-11-08: qty 5

## 2022-11-08 MED ORDER — ENSURE PRE-SURGERY PO LIQD
592.0000 mL | Freq: Once | ORAL | Status: DC
  Filled 2022-11-08: qty 592

## 2022-11-08 MED ORDER — ALVIMOPAN 12 MG PO CAPS
12.0000 mg | ORAL_CAPSULE | ORAL | Status: AC
  Administered 2022-11-08: 12 mg via ORAL
  Filled 2022-11-08: qty 1

## 2022-11-08 MED ORDER — LACTATED RINGERS IV SOLN: INTRAVENOUS | Status: DC | PRN

## 2022-11-08 MED ORDER — HYDROMORPHONE HCL 1 MG/ML IJ SOLN
INTRAMUSCULAR | Status: DC | PRN
  Administered 2022-11-08: .5 mg via INTRAVENOUS

## 2022-11-08 MED ORDER — KCL IN DEXTROSE-NACL 20-5-0.45 MEQ/L-%-% IV SOLN
INTRAVENOUS | Status: DC
  Filled 2022-11-08 (×2): qty 1000

## 2022-11-08 MED ORDER — TAMSULOSIN HCL 0.4 MG PO CAPS
0.4000 mg | ORAL_CAPSULE | Freq: Every day | ORAL | Status: DC
  Administered 2022-11-08 – 2022-11-10 (×3): 0.4 mg via ORAL
  Filled 2022-11-08 (×3): qty 1

## 2022-11-08 MED ORDER — ROCURONIUM BROMIDE 10 MG/ML (PF) SYRINGE
PREFILLED_SYRINGE | INTRAVENOUS | Status: AC
  Filled 2022-11-08: qty 10

## 2022-11-08 MED ORDER — ACETAMINOPHEN 10 MG/ML IV SOLN
INTRAVENOUS | Status: AC
  Filled 2022-11-08: qty 100

## 2022-11-08 MED ORDER — METOPROLOL SUCCINATE ER 25 MG PO TB24
25.0000 mg | ORAL_TABLET | Freq: Every day | ORAL | Status: DC
  Administered 2022-11-09 – 2022-11-10 (×2): 25 mg via ORAL
  Filled 2022-11-08 (×2): qty 1

## 2022-11-08 MED ORDER — BUPIVACAINE LIPOSOME 1.3 % IJ SUSP
INTRAMUSCULAR | Status: AC
  Filled 2022-11-08: qty 20

## 2022-11-08 MED ORDER — CHLORHEXIDINE GLUCONATE 0.12 % MT SOLN
15.0000 mL | Freq: Once | OROMUCOSAL | Status: AC
  Administered 2022-11-08: 15 mL via OROMUCOSAL

## 2022-11-08 MED ORDER — ONDANSETRON HCL 4 MG PO TABS: 4.0000 mg | ORAL_TABLET | Freq: Four times a day (QID) | ORAL | Status: DC | PRN

## 2022-11-08 MED ORDER — 0.9 % SODIUM CHLORIDE (POUR BTL) OPTIME
TOPICAL | Status: DC | PRN
  Administered 2022-11-08: 2000 mL

## 2022-11-08 MED ORDER — EPHEDRINE SULFATE (PRESSORS) 50 MG/ML IJ SOLN
INTRAMUSCULAR | Status: DC | PRN
  Administered 2022-11-08: 10 mg via INTRAVENOUS
  Administered 2022-11-08: 5 mg via INTRAVENOUS
  Administered 2022-11-08: 10 mg via INTRAVENOUS

## 2022-11-08 MED ORDER — SENNOSIDES-DOCUSATE SODIUM 8.6-50 MG PO TABS: 1.0000 | ORAL_TABLET | Freq: Every evening | ORAL | Status: DC | PRN

## 2022-11-08 MED ORDER — ENSURE PRE-SURGERY PO LIQD
296.0000 mL | Freq: Once | ORAL | Status: DC
  Filled 2022-11-08: qty 296

## 2022-11-08 MED ORDER — HYDROMORPHONE HCL 1 MG/ML IJ SOLN: 0.5000 mg | INTRAMUSCULAR | Status: DC | PRN

## 2022-11-08 MED ORDER — DEXAMETHASONE SODIUM PHOSPHATE 10 MG/ML IJ SOLN
INTRAMUSCULAR | Status: DC | PRN
  Administered 2022-11-08: 8 mg via INTRAVENOUS

## 2022-11-08 MED ORDER — POLYETHYLENE GLYCOL 3350 17 GM/SCOOP PO POWD
1.0000 | Freq: Once | ORAL | Status: DC
  Filled 2022-11-08: qty 255

## 2022-11-08 MED ORDER — MELATONIN 5 MG PO TABS
5.0000 mg | ORAL_TABLET | Freq: Every day | ORAL | Status: DC
  Administered 2022-11-08 – 2022-11-09 (×2): 5 mg via ORAL
  Filled 2022-11-08 (×2): qty 1

## 2022-11-08 MED ORDER — ALUM & MAG HYDROXIDE-SIMETH 200-200-20 MG/5ML PO SUSP
30.0000 mL | Freq: Four times a day (QID) | ORAL | Status: DC | PRN
  Administered 2022-11-10: 30 mL via ORAL
  Filled 2022-11-08: qty 30

## 2022-11-08 MED ORDER — SIMETHICONE 80 MG PO CHEW
40.0000 mg | CHEWABLE_TABLET | Freq: Four times a day (QID) | ORAL | Status: DC | PRN
  Administered 2022-11-09: 40 mg via ORAL
  Filled 2022-11-08: qty 1

## 2022-11-08 MED ORDER — ROCURONIUM BROMIDE 100 MG/10ML IV SOLN
INTRAVENOUS | Status: DC | PRN
  Administered 2022-11-08: 60 mg via INTRAVENOUS
  Administered 2022-11-08 (×2): 30 mg via INTRAVENOUS

## 2022-11-08 MED ORDER — MIDAZOLAM HCL 2 MG/2ML IJ SOLN
INTRAMUSCULAR | Status: AC
  Filled 2022-11-08: qty 2

## 2022-11-08 MED ORDER — ALBUMIN HUMAN 5 % IV SOLN
INTRAVENOUS | Status: AC
  Filled 2022-11-08: qty 250

## 2022-11-08 MED ORDER — PROPOFOL 10 MG/ML IV BOLUS
INTRAVENOUS | Status: AC
  Filled 2022-11-08: qty 20

## 2022-11-08 MED ORDER — ACETAMINOPHEN 10 MG/ML IV SOLN
INTRAVENOUS | Status: DC | PRN
  Administered 2022-11-08: 1000 mg via INTRAVENOUS

## 2022-11-08 MED ORDER — PANTOPRAZOLE SODIUM 40 MG PO TBEC
80.0000 mg | DELAYED_RELEASE_TABLET | Freq: Every day | ORAL | Status: DC
  Administered 2022-11-08 – 2022-11-10 (×3): 80 mg via ORAL
  Filled 2022-11-08 (×3): qty 2

## 2022-11-08 MED ORDER — ALVIMOPAN 12 MG PO CAPS
12.0000 mg | ORAL_CAPSULE | Freq: Two times a day (BID) | ORAL | Status: DC
  Filled 2022-11-08 (×2): qty 1

## 2022-11-08 MED ORDER — ONDANSETRON HCL 4 MG/2ML IJ SOLN: 4.0000 mg | Freq: Four times a day (QID) | INTRAMUSCULAR | Status: DC | PRN

## 2022-11-08 MED ORDER — ORAL CARE MOUTH RINSE: 15.0000 mL | Freq: Once | OROMUCOSAL | Status: AC

## 2022-11-08 MED ORDER — BUPIVACAINE LIPOSOME 1.3 % IJ SUSP
INTRAMUSCULAR | Status: DC | PRN
  Administered 2022-11-08: 20 mL

## 2022-11-08 MED ORDER — PROPOFOL 10 MG/ML IV BOLUS
INTRAVENOUS | Status: DC | PRN
  Administered 2022-11-08: 150 mg via INTRAVENOUS

## 2022-11-08 MED ORDER — LACTATED RINGERS IV SOLN: INTRAVENOUS | Status: DC

## 2022-11-08 MED ORDER — LACTATED RINGERS IR SOLN
Status: DC | PRN
  Administered 2022-11-08: 1000 mL

## 2022-11-08 MED ORDER — ALBUMIN HUMAN 5 % IV SOLN: INTRAVENOUS | Status: DC | PRN

## 2022-11-08 MED ORDER — STERILE WATER FOR IRRIGATION IR SOLN
Status: DC | PRN
  Administered 2022-11-08: 1000 mL

## 2022-11-08 MED ORDER — FENTANYL CITRATE (PF) 100 MCG/2ML IJ SOLN
INTRAMUSCULAR | Status: DC | PRN
  Administered 2022-11-08 (×5): 50 ug via INTRAVENOUS

## 2022-11-08 MED ORDER — DIPHENHYDRAMINE HCL 50 MG/ML IJ SOLN: 12.5000 mg | Freq: Four times a day (QID) | INTRAMUSCULAR | Status: DC | PRN

## 2022-11-08 MED ORDER — ENOXAPARIN SODIUM 40 MG/0.4ML IJ SOSY
40.0000 mg | PREFILLED_SYRINGE | INTRAMUSCULAR | Status: DC
  Administered 2022-11-09 – 2022-11-10 (×2): 40 mg via SUBCUTANEOUS
  Filled 2022-11-08 (×2): qty 0.4

## 2022-11-08 MED ORDER — EPHEDRINE 5 MG/ML INJ
INTRAVENOUS | Status: AC
  Filled 2022-11-08: qty 5

## 2022-11-08 MED ORDER — SODIUM CHLORIDE 0.9 % IV SOLN
2.0000 g | INTRAVENOUS | Status: AC
  Administered 2022-11-08: 2 g via INTRAVENOUS
  Filled 2022-11-08: qty 2

## 2022-11-08 MED ORDER — GABAPENTIN 400 MG PO CAPS
600.0000 mg | ORAL_CAPSULE | Freq: Two times a day (BID) | ORAL | Status: DC
  Administered 2022-11-08 – 2022-11-10 (×4): 600 mg via ORAL
  Filled 2022-11-08 (×4): qty 2

## 2022-11-08 MED ORDER — LIDOCAINE HCL (CARDIAC) PF 100 MG/5ML IV SOSY
PREFILLED_SYRINGE | INTRAVENOUS | Status: DC | PRN
  Administered 2022-11-08: 60 mg via INTRAVENOUS

## 2022-11-08 MED ORDER — SUGAMMADEX SODIUM 200 MG/2ML IV SOLN
INTRAVENOUS | Status: DC | PRN
  Administered 2022-11-08: 250 mg via INTRAVENOUS

## 2022-11-08 MED ORDER — ONDANSETRON HCL 4 MG/2ML IJ SOLN
INTRAMUSCULAR | Status: AC
  Filled 2022-11-08: qty 2

## 2022-11-08 MED ORDER — ONDANSETRON HCL 4 MG/2ML IJ SOLN
INTRAMUSCULAR | Status: DC | PRN
  Administered 2022-11-08: 4 mg via INTRAVENOUS

## 2022-11-08 MED ORDER — DIPHENHYDRAMINE HCL 12.5 MG/5ML PO ELIX: 12.5000 mg | ORAL_SOLUTION | Freq: Four times a day (QID) | ORAL | Status: DC | PRN

## 2022-11-08 MED ORDER — SACCHAROMYCES BOULARDII 250 MG PO CAPS
250.0000 mg | ORAL_CAPSULE | Freq: Two times a day (BID) | ORAL | Status: DC
  Administered 2022-11-08 – 2022-11-10 (×4): 250 mg via ORAL
  Filled 2022-11-08 (×4): qty 1

## 2022-11-08 MED ORDER — ACETAMINOPHEN 500 MG PO TABS
1000.0000 mg | ORAL_TABLET | ORAL | Status: AC
  Administered 2022-11-08: 1000 mg via ORAL
  Filled 2022-11-08: qty 2

## 2022-11-08 SURGICAL SUPPLY — 92 items
ADH SKN CLS APL DERMABOND .7 (GAUZE/BANDAGES/DRESSINGS) ×1
BAG COUNTER SPONGE SURGICOUNT (BAG) ×1 IMPLANT
BAG SPNG CNTER NS LX DISP (BAG)
BLADE EXTENDED COATED 6.5IN (ELECTRODE) IMPLANT
CANNULA REDUCER 12-8 DVNC XI (CANNULA) IMPLANT
CELLS DAT CNTRL 66122 CELL SVR (MISCELLANEOUS) ×1 IMPLANT
COVER SURGICAL LIGHT HANDLE (MISCELLANEOUS) ×2 IMPLANT
COVER TIP SHEARS 8 DVNC (MISCELLANEOUS) ×1 IMPLANT
DERMABOND ADVANCED .7 DNX12 (GAUZE/BANDAGES/DRESSINGS) IMPLANT
DRAIN CHANNEL 19F RND (DRAIN) IMPLANT
DRAPE ARM DVNC X/XI (DISPOSABLE) ×4 IMPLANT
DRAPE COLUMN DVNC XI (DISPOSABLE) ×1 IMPLANT
DRAPE SURG IRRIG POUCH 19X23 (DRAPES) ×1 IMPLANT
DRIVER NDL LRG 8 DVNC XI (INSTRUMENTS) ×1 IMPLANT
DRIVER NDLE LRG 8 DVNC XI (INSTRUMENTS) ×1 IMPLANT
DRSG OPSITE POSTOP 4X10 (GAUZE/BANDAGES/DRESSINGS) IMPLANT
DRSG OPSITE POSTOP 4X6 (GAUZE/BANDAGES/DRESSINGS) IMPLANT
DRSG OPSITE POSTOP 4X8 (GAUZE/BANDAGES/DRESSINGS) IMPLANT
ELECT PENCIL ROCKER SW 15FT (MISCELLANEOUS) ×1 IMPLANT
ELECT REM PT RETURN 15FT ADLT (MISCELLANEOUS) ×1 IMPLANT
ENDOLOOP SUT PDS II  0 18 (SUTURE)
ENDOLOOP SUT PDS II 0 18 (SUTURE) IMPLANT
EVACUATOR SILICONE 100CC (DRAIN) IMPLANT
GLOVE BIO SURGEON STRL SZ 6.5 (GLOVE) ×3 IMPLANT
GLOVE BIOGEL PI IND STRL 7.0 (GLOVE) ×2 IMPLANT
GLOVE INDICATOR 6.5 STRL GRN (GLOVE) ×1 IMPLANT
GOWN SRG XL LVL 4 BRTHBL STRL (GOWNS) ×1 IMPLANT
GOWN STRL NON-REIN XL LVL4 (GOWNS) ×1
GOWN STRL REUS W/ TWL XL LVL3 (GOWN DISPOSABLE) ×3 IMPLANT
GOWN STRL REUS W/TWL XL LVL3 (GOWN DISPOSABLE) ×3
GRASPER SUT TROCAR 14GX15 (MISCELLANEOUS) IMPLANT
GRASPER TIP-UP FEN DVNC XI (INSTRUMENTS) ×1 IMPLANT
HOLDER FOLEY CATH W/STRAP (MISCELLANEOUS) ×1 IMPLANT
IRRIG SUCT STRYKERFLOW 2 WTIP (MISCELLANEOUS) ×1
IRRIGATION SUCT STRKRFLW 2 WTP (MISCELLANEOUS) ×1 IMPLANT
KIT PROCEDURE DVNC SI (MISCELLANEOUS) IMPLANT
KIT TURNOVER KIT A (KITS) IMPLANT
NDL INSUFFLATION 14GA 120MM (NEEDLE) ×1 IMPLANT
NEEDLE INSUFFLATION 14GA 120MM (NEEDLE) ×1 IMPLANT
PACK CARDIOVASCULAR III (CUSTOM PROCEDURE TRAY) ×1 IMPLANT
PACK COLON (CUSTOM PROCEDURE TRAY) ×1 IMPLANT
PAD POSITIONING PINK XL (MISCELLANEOUS) ×1 IMPLANT
RELOAD STAPLE 60 2.5 WHT DVNC (STAPLE) IMPLANT
RELOAD STAPLE 60 3.5 BLU DVNC (STAPLE) IMPLANT
RELOAD STAPLE 60 4.3 GRN DVNC (STAPLE) IMPLANT
RELOAD STAPLER 2.5X60 WHT DVNC (STAPLE) ×1 IMPLANT
RELOAD STAPLER 3.5X60 BLU DVNC (STAPLE) ×1 IMPLANT
RELOAD STAPLER 4.3X60 GRN DVNC (STAPLE) ×1 IMPLANT
RETRACTOR WND ALEXIS 18 MED (MISCELLANEOUS) IMPLANT
RTRCTR WOUND ALEXIS 18CM MED (MISCELLANEOUS) ×1
SCISSORS LAP 5X35 DISP (ENDOMECHANICALS) IMPLANT
SCISSORS MNPLR CVD DVNC XI (INSTRUMENTS) ×1 IMPLANT
SEAL UNIV 5-12 XI (MISCELLANEOUS) ×3 IMPLANT
SEALER VESSEL EXT DVNC XI (MISCELLANEOUS) ×1 IMPLANT
SOL ELECTROSURG ANTI STICK (MISCELLANEOUS) ×1
SOLUTION ELECTROSURG ANTI STCK (MISCELLANEOUS) ×1 IMPLANT
SPIKE FLUID TRANSFER (MISCELLANEOUS) IMPLANT
STAPLER 60 SUREFORM DVNC (STAPLE) IMPLANT
STAPLER ECHELON POWER CIR 29 (STAPLE) IMPLANT
STAPLER ECHELON POWER CIR 31 (STAPLE) IMPLANT
STAPLER RELOAD 2.5X60 WHT DVNC (STAPLE) ×1
STAPLER RELOAD 3.5X60 BLU DVNC (STAPLE) ×1
STAPLER RELOAD 4.3X60 GRN DVNC (STAPLE) ×1
STOPCOCK 4 WAY LG BORE MALE ST (IV SETS) ×2 IMPLANT
SUT ETHILON 2 0 PS N (SUTURE) IMPLANT
SUT NOVA NAB GS-21 1 T12 (SUTURE) ×2 IMPLANT
SUT PROLENE 2 0 KS (SUTURE) IMPLANT
SUT SILK 2 0 (SUTURE) ×1
SUT SILK 2 0 SH CR/8 (SUTURE) IMPLANT
SUT SILK 2-0 18XBRD TIE 12 (SUTURE) ×1 IMPLANT
SUT SILK 3 0 (SUTURE)
SUT SILK 3 0 SH CR/8 (SUTURE) ×1 IMPLANT
SUT SILK 3-0 18XBRD TIE 12 (SUTURE) IMPLANT
SUT V-LOC BARB 180 2/0GR6 GS22 (SUTURE) ×2
SUT VIC AB 2-0 SH 18 (SUTURE) IMPLANT
SUT VIC AB 2-0 SH 27 (SUTURE)
SUT VIC AB 2-0 SH 27X BRD (SUTURE) IMPLANT
SUT VIC AB 3-0 SH 18 (SUTURE) IMPLANT
SUT VIC AB 4-0 PS2 27 (SUTURE) ×2 IMPLANT
SUT VICRYL 0 UR6 27IN ABS (SUTURE) ×1 IMPLANT
SUTURE V-LC BRB 180 2/0GR6GS22 (SUTURE) IMPLANT
SYR 20ML ECCENTRIC (SYRINGE) ×1 IMPLANT
SYS LAPSCP GELPORT 120MM (MISCELLANEOUS)
SYS WOUND ALEXIS 18CM MED (MISCELLANEOUS)
SYSTEM LAPSCP GELPORT 120MM (MISCELLANEOUS) IMPLANT
SYSTEM WOUND ALEXIS 18CM MED (MISCELLANEOUS) IMPLANT
TOWEL OR 17X26 10 PK STRL BLUE (TOWEL DISPOSABLE) IMPLANT
TOWEL OR NON WOVEN STRL DISP B (DISPOSABLE) ×1 IMPLANT
TRAY FOLEY MTR SLVR 16FR STAT (SET/KITS/TRAYS/PACK) ×1 IMPLANT
TROCAR ADV FIXATION 5X100MM (TROCAR) ×1 IMPLANT
TUBING CONNECTING 10 (TUBING) ×2 IMPLANT
TUBING INSUFFLATION 10FT LAP (TUBING) ×1 IMPLANT

## 2022-11-08 NOTE — Anesthesia Postprocedure Evaluation (Signed)
Anesthesia Post Note  Patient: [name]  Procedure(s) Performed: XI ROBOT ASSISTED RIGHT COLECTOMY     Patient location during evaluation: Endoscopy Anesthesia Type: General Level of consciousness: awake and alert Pain management: pain level controlled Vital Signs Assessment: post-procedure vital signs reviewed and stable Respiratory status: spontaneous breathing, nonlabored ventilation, respiratory function stable and patient connected to nasal cannula oxygen Cardiovascular status: blood pressure returned to baseline and stable Postop Assessment: no apparent nausea or vomiting Anesthetic complications: no  No notable events documented.  Last Vitals:  Vitals:   11/08/22 1315 11/08/22 1330  BP: 127/72 124/80  Pulse: (!) 49 (!) 50  Resp: 12 17  Temp:    SpO2: 100% 100%    Last Pain:  Vitals:   11/08/22 1330  TempSrc:   PainSc: 0-No pain                 Trevor Iha

## 2022-11-08 NOTE — Transfer of Care (Signed)
Immediate Anesthesia Transfer of Care Note  Patient: [name]  Procedure(s) Performed: XI ROBOT ASSISTED RIGHT COLECTOMY  Patient Location: PACU  Anesthesia Type:General  Level of Consciousness: awake, alert , oriented, and patient cooperative  Airway & Oxygen Therapy: Patient Spontanous Breathing and Patient connected to face mask oxygen  Post-op Assessment: Report given to RN and Post -op Vital signs reviewed and stable  Post vital signs: Reviewed and stable  Last Vitals:  Vitals Value Taken Time  BP    Temp    Pulse    Resp    SpO2      Last Pain:  Vitals:   11/08/22 0716  TempSrc: Oral  PainSc: 0-No pain         Complications: No notable events documented.

## 2022-11-08 NOTE — Anesthesia Procedure Notes (Signed)
Procedure Name: Intubation Date/Time: 11/08/2022 9:35 AM  Performed by: Garth Bigness, CRNAPre-anesthesia Checklist: Patient identified, Emergency Drugs available, Suction available and Patient being monitored Patient Re-evaluated:Patient Re-evaluated prior to induction Oxygen Delivery Method: Circle system utilized Preoxygenation: Pre-oxygenation with 100% oxygen Induction Type: IV induction Ventilation: Mask ventilation without difficulty Laryngoscope Size: Mac and 4 Grade View: Grade I Tube type: Oral Tube size: 7.5 mm Number of attempts: 1 Airway Equipment and Method: Stylet Placement Confirmation: ETT inserted through vocal cords under direct vision, positive ETCO2 and breath sounds checked- equal and bilateral Secured at: 23 cm Tube secured with: Tape Dental Injury: Teeth and Oropharynx as per pre-operative assessment

## 2022-11-08 NOTE — Op Note (Signed)
11/08/2022  11:52 AM  PATIENT:  [name]  77 y.o. male  Patient Care Team: Anabel Halon, MD as PCP - General (Internal Medicine) Lennette Bihari, MD as PCP - Cardiology (Cardiology) Janalyn Harder, MD (Inactive) as Consulting Physician (Dermatology)  PRE-OPERATIVE DIAGNOSIS:  COLON CANCER  POST-OPERATIVE DIAGNOSIS:  COLON CANCER  PROCEDURE:  XI ROBOT ASSISTED RIGHT COLECTOMY   Surgeon(s): Almond Lint, MD Romie Levee, MD  ASSISTANT: Dr Donell Beers   ANESTHESIA:   local and general  EBL:  20 ml Total I/O In: 1650 [I.V.:1200; IV Piggyback:450] Out: 170 [Urine:150; Blood:20]  Delay start of Pharmacological VTE agent (>24hrs) due to surgical blood loss or risk of bleeding:  no  DRAINS: none   SPECIMEN:  Source of Specimen:  R colon  DISPOSITION OF SPECIMEN:  PATHOLOGY  COUNTS:  YES  PLAN OF CARE: Admit to inpatient   PATIENT DISPOSITION:  PACU - hemodynamically stable.  INDICATION:    77 y.o. M with anemia and proximal colon mass.  I recommended segmental resection:  The anatomy & physiology of the digestive tract was discussed.  The pathophysiology was discussed.  Natural history risks without surgery was discussed.   I worked to give an overview of the disease and the frequent need to have multispecialty involvement.  I feel the risks of no intervention will lead to serious problems that outweigh the operative risks; therefore, I recommended a partial colectomy to remove the pathology.  Laparoscopic & open techniques were discussed.   Risks such as bleeding, infection, abscess, leak, reoperation, possible ostomy, hernia, heart attack, death, and other risks were discussed.  I noted a good likelihood this will help address the problem.   Goals of post-operative recovery were discussed as well.    The patient expressed understanding & wished to proceed with surgery.  OR FINDINGS:   Patient had mass in ascending colon  No obvious metastatic disease on  visceral parietal peritoneum or liver.  DESCRIPTION:   Informed consent was confirmed.  The patient underwent general anaesthesia without difficulty.  The patient was positioned appropriately.  VTE prevention in place.  The patient's abdomen was clipped, prepped, & draped in a sterile fashion.  Surgical timeout confirmed our plan.  The patient was positioned in reverse Trendelenburg.  Abdominal entry was gained using a Varies needle in the LUQ.  Entry was clean.  I induced carbon dioxide insufflation.  An 8mm robotic port was placed in the LUQ.  Camera inspection revealed no injury.  Extra ports were carefully placed under direct laparoscopic visualization.  I laparoscopically reflected the greater omentum and the upper abdomen the small bowel in the peilvis. The patient was appropriately positioned and the robot was docked to the patient's right side.  Instruments were placed under direct visualization.    I began by identifying the ileocolic artery and vein within the mesentery. Dissection was bluntly carried around these structures. The duodenum was identified and free from the structures. I then separated the structures bluntly and used the robotic vessel sealer device to transect these.  I developed the retroperitoneal plane bluntly.  I then freed the appendix off its attachments to the pelvic wall. I mobilized the terminal ileum.  I took care to avoid injuring any retroperitoneal structures.  After this I began to mobilize laterally down the white line of Toldt and then took down the hepatic flexure using the Enseal device. I mobilized the omentum off of the right transverse colon. The entire colon was then flipped  medially and mobilized off of the retroperitoneal structures until I could visualize the lateral edge of the duodenum underneath.  I gently freed the duodenal attachments.   I identified a portion of mesentery of the transverse colon just proximal to the right branch of the middle colic.   I divided up to the colon from the previous dissection of the mesentery using the robotic vessel sealer.  I then divided the terminal ileal mesentery in similar fashion.  At that point, the terminal ileum was divided with a blue load robotic 60 mm stapler.  The transverse colon was divided with a green load robotic stapler.  The specimen was then completely free and placed in the right upper quadrant.  There was pulsatile bleeding at both staple lines that was controlled with gentle bipolar cautery.  I then oriented the remaining terminal ileum and transverse colon and a isoperistaltic fashion.  I placed an enterotomy in the small bowel and colon using the robotic scissors.  I then introduced a white load 60 mm robotic stapler into both enterotomies and created an anastomosis between the small bowel and transverse colon.  Hemostasis within the staple line was good.  The common enterotomy channel was closed using 2 running 2-0 V-Loc sutures.  The abdomen was then irrigated with normal saline. The omentum was then brought down over the anastomosis.  At this point the robot was undocked.  The 12 mm suprapubic port was enlarged to a Pfannenstiel incision and an Alexis wound protector was placed.  The specimen was removed from the abdomen and evaluated.  Once the abdomen was inspected for hemostasis, the Alexis wound protector was removed.    The peritoneum of the Pfannenstiel incision was closed using a running 0 Vicryl suture.  The fascia was then closed using #1 Novafil interrupted sutures.  The subcutaneous tissue of the extraction incision was closed using a running 2-0 Vicryl suture. The skin was then closed using running subcuticular 4-0 Vicryl sutures.  A sterile dressing was applied.  The remaining port sites were closed using interrupted 4-0 Vicryl sutures and Dermabond.  All counts were correct per operating room staff. The patient was then awakened from anesthesia and sent to the post anesthesia care unit  in stable condition.     Vanita Panda, MD  Colorectal and General Surgery [facility]

## 2022-11-08 NOTE — H&P (Signed)
Russell [name] [birth year]  [id number].     Requesting MD: Amada Jupiter, MD Chief Complaint/Reason for Consult: ascending colon mass HPI:  Patient is a 77 year old male who presented to the ED with fatigue x 3 weeks. Gradually progressive with occasional feelings of lightheadedness and weakness. He was seen by PCP recently and diagnosed with superficial thrombophlebitis of lower extremity and started on Eliquis. He had 3 doses of this and then labs resulted with a significant drop in hemoglobin and PCP directed patient to the ED. Hgb noted to be 7.2 compared to previous baseline of 12.7. He reported intermittent dark stools with constipation and does have some chronic constipation as well. No history of prior GI bleeding and last colonoscopy was in 2019 and significant for one diminutive polyp. Last EGD in 2022 with gastric polyps but no evidence of malignancy.    PMH otherwise significant for Hx of CVA, Hx of non-small cell lung cancer s/p lobectomy, hx of cauda equina syndrome s/p laminectomy, GERD, peripheral neuropathy, internal hemorrhoids, chronic anemia, and OSA. No prior abdominal surgery. NKDA.      Past Surgical History:  Procedure Laterality Date   BIOPSY  09/28/2022   Procedure: BIOPSY;  Surgeon: Sherrilyn Rist, MD;  Location: [facility];  Service: Gastroenterology;;   BRONCHIAL BIOPSY  05/16/2021   Procedure: BRONCHIAL BIOPSIES;  Surgeon: Josephine Igo, DO;  Location: [name];  Service: Pulmonary;;   BRONCHIAL BRUSHINGS  05/16/2021   Procedure: BRONCHIAL BRUSHINGS;  Surgeon: Josephine Igo, DO;  Location: [name];  Service: Pulmonary;;   BRONCHIAL NEEDLE ASPIRATION BIOPSY  05/16/2021   Procedure: BRONCHIAL NEEDLE ASPIRATION BIOPSIES;  Surgeon: Josephine Igo, DO;  Location: [name];  Service: Pulmonary;;   CERVICAL SPINE SURGERY  04/10/2020   C3-C4 with pin at [facility]   COLONOSCOPY     COLONOSCOPY WITH PROPOFOL N/A 09/28/2022   Procedure: COLONOSCOPY WITH  PROPOFOL;  Surgeon: Sherrilyn Rist, MD;  Location: [facility];  Service: Gastroenterology;  Laterality: N/A;   ESOPHAGOGASTRODUODENOSCOPY (EGD) WITH PROPOFOL N/A 09/28/2022   Procedure: ESOPHAGOGASTRODUODENOSCOPY (EGD) WITH PROPOFOL;  Surgeon: Sherrilyn Rist, MD;  Location: [facility];  Service: Gastroenterology;  Laterality: N/A;   FIDUCIAL MARKER PLACEMENT  05/16/2021   Procedure: FIDUCIAL MARKER PLACEMENT;  Surgeon: Josephine Igo, DO;  Location: [name];  Service: Pulmonary;;   HEMORRHOID BANDING     LUMBAR LAMINECTOMY  03/08/2010   L2-3, cauda equina syndrome   lumbar laminectomy  05/12/2020   [facility]   SPINE SURGERY     UPPER GI ENDOSCOPY     VIDEO BRONCHOSCOPY WITH ENDOBRONCHIAL NAVIGATION Left 05/16/2021   Procedure: VIDEO BRONCHOSCOPY WITH ENDOBRONCHIAL NAVIGATION;  Surgeon: Josephine Igo, DO;  Location: [name];  Service: Pulmonary;  Laterality: Left;  ION w/ fiducial placement   VIDEO BRONCHOSCOPY WITH RADIAL ENDOBRONCHIAL ULTRASOUND  05/16/2021   Procedure: VIDEO BRONCHOSCOPY WITH RADIAL ENDOBRONCHIAL ULTRASOUND;  Surgeon: Josephine Igo, DO;  Location: [name];  Service: Pulmonary;;   wisdom teeth ext     Family History  Problem Relation Age of Onset   Aortic aneurysm Mother    Varicose Veins Mother    Heart attack Father    Arthritis Father    Aortic aneurysm Sister    Early death Sister    CVA Sister    Aortic aneurysm Sister    Aortic aneurysm Sister    Colon cancer Neg Hx    Pancreatic cancer Neg Hx  Rectal cancer Neg Hx    Stomach cancer Neg Hx    Esophageal cancer Neg Hx    Colon polyps Neg Hx    Social History   Socioeconomic History   Marital status: Married    Spouse name: Not on file   Number of children: 0   Years of education: Not on file   Highest education level: Master's degree (e.g., MA, MS, MEng, MEd, MSW, MBA)  Occupational History   Occupation: retired  Tobacco Use   Smoking status: Former    Packs/day:  0.25    Years: 9.00    Additional pack years: 0.00    Total pack years: 2.25    Types: Cigarettes    Start date: 10/15/1965    Quit date: 10/16/1974    Years since quitting: 48.0    Passive exposure: Past   Smokeless tobacco: Never  Vaping Use   Vaping Use: Never used  Substance and Sexual Activity   Alcohol use: Not Currently   Drug use: No   Sexual activity: Not Currently  Other Topics Concern   Not on file  Social History Narrative   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   Social Determinants of Health   Financial Resource Strain: Low Risk  (10/08/2022)   Overall Financial Resource Strain (CARDIA)    Difficulty of Paying Living Expenses: Not hard at all  Food Insecurity: No Food Insecurity (10/08/2022)   Hunger Vital Sign    Worried About Running Out of Food in the Last Year: Never true    Ran Out of Food in the Last Year: Never true  Transportation Needs: No Transportation Needs (10/08/2022)   PRAPARE - Administrator, Civil Service (Medical): No    Lack of Transportation (Non-Medical): No  Physical Activity: Sufficiently Active (10/08/2022)   Exercise Vital Sign    Days of Exercise per Week: 6 days    Minutes of Exercise per Session: 30 min  Stress: No Stress Concern Present (10/08/2022)   [employer] of Occupational Health - Occupational Stress Questionnaire    Feeling of Stress : Not at all  Social Connections: Unknown (10/08/2022)   Social Connection and Isolation Panel [NHANES]    Frequency of Communication with Friends and Family: Once a week    Frequency of Social Gatherings with Friends and Family: Patient declined    Attends Religious Services: Patient declined    Database administrator or Organizations: No    Attends Banker Meetings: Never    Marital  Status: Married  Catering manager Violence: Not At Risk (09/27/2022)   Humiliation, Afraid, Rape, and Kick questionnaire    Fear of Current or Ex-Partner: No    Emotionally Abused: No    Physically Abused: No    Sexually Abused: No    Current Facility-Administered Medications:    bisacodyl (DULCOLAX) EC tablet 20 mg, 20 mg, Oral, Once, Romie Levee, MD   bupivacaine liposome (EXPAREL) 1.3 % injection 266 mg, 20 mL, Infiltration, Once, Romie Levee, MD   cefoTEtan (CEFOTAN) 2 g in sodium chloride 0.9 % 100 mL IVPB, 2 g, Intravenous, On Call to OR, Romie Levee, MD   [START ON 11/09/2022] feeding supplement (ENSURE PRE-SURGERY) liquid 296 mL, 296 mL, Oral, Once,  Romie Levee, MD   feeding supplement (ENSURE PRE-SURGERY) liquid 592 mL, 592 mL, Oral, Once, Romie Levee, MD   lactated ringers infusion, , Intravenous, Continuous, Kaylyn Layer, MD, Last Rate: 10 mL/hr at 11/08/22 0655, Started During Downtime at 11/08/22 0655   polyethylene glycol powder (GLYCOLAX/MIRALAX) container 255 g, 1 Container, Oral, Once, Romie Levee, MD No Known Allergies Review of Systems - Negative except as stated above BP 123/76   Pulse 60   Temp 98.1 F (36.7 C) (Oral)   Resp 18   Ht 6' (1.829 m)   Wt 88 kg   SpO2 96%   BMI 26.31 kg/m  Gen: NAD CV: RRR Lungs: CTA Abd: soft Assessment/Plan Ascending colon mass - s/p EGD and colonoscopy 3/15 which showed non-obstructing mass in proximal ascending colon without visible active bleeding, path is pending   - staging CTs show no sign of metastatic disease - CEA  2.1       FEN: Reg diet, IVF per TRH VTE: stop Eliquis, hep sq ID: no current indication for abx   Hx of CVA Hx of stage Ia NSC lung CA s/p left lower lobectomy in February 2023 Hx of cauda equina syndrome s/p laminectomy in 2021 ABL on chronic anemia - secondary to GI losses Superficial vein thrombosis of LE - was started on Eliquis per PCP, generally superficial thrombosis does  not require anticoagulation but defer to PCP to follow up recs  Peripheral neuropathy  GERD Internal hemorrhoids  OSA        Discussed surgical resection, techniques, complications, long-term expectations and expected recovery.The surgery and anatomy were described to the patient as well as the risks of surgery and the possible complications.  These include: Bleeding, deep abdominal infections and possible wound complications such as hernia and infection, damage to adjacent structures, leak of surgical connections, which can lead to other surgeries and possibly an ostomy, possible need for other procedures, such as abscess drains in radiology, possible prolonged hospital stay, possible diarrhea from removal of part of the colon, possible constipation from narcotics, prolonged fatigue/weakness or appetite loss, possible early recurrence of of disease, possible complications of their medical problems such as heart disease or arrhythmias or lung problems, death (less than 1%). I believe the patient understands and wishes to proceed with the surgery.  I reviewed Consultant GI notes, hospitalist notes, last 24 h vitals and pain scores, last 48 h intake and output, last 24 h labs and trends, and last 24 h imaging results.   This care required moderate level of medical decision making.      [facility] 09/30/2022, 7:27 AM Please see Amion for pager number during day hours 7:00am-4:30pm

## 2022-11-09 LAB — CBC
HCT: 25.6 % — ABNORMAL LOW (ref 39.0–52.0)
Hemoglobin: 7.8 g/dL — ABNORMAL LOW (ref 13.0–17.0)
MCH: 21.1 pg — ABNORMAL LOW (ref 26.0–34.0)
MCHC: 30.5 g/dL (ref 30.0–36.0)
MCV: 69.2 fL — ABNORMAL LOW (ref 80.0–100.0)
Platelets: 254 10*3/uL (ref 150–400)
RBC: 3.7 MIL/uL — ABNORMAL LOW (ref 4.22–5.81)
RDW: 26 % — ABNORMAL HIGH (ref 11.5–15.5)
WBC: 12 10*3/uL — ABNORMAL HIGH (ref 4.0–10.5)
nRBC: 0 % (ref 0.0–0.2)

## 2022-11-09 LAB — BASIC METABOLIC PANEL
Anion gap: 7 (ref 5–15)
BUN: 10 mg/dL (ref 8–23)
CO2: 22 mmol/L (ref 22–32)
Calcium: 8.4 mg/dL — ABNORMAL LOW (ref 8.9–10.3)
Chloride: 105 mmol/L (ref 98–111)
Creatinine, Ser: 0.74 mg/dL (ref 0.61–1.24)
GFR, Estimated: >60 mL/min (ref >60)
Glucose, Bld: 151 mg/dL — ABNORMAL HIGH (ref 70–99)
Potassium: 3.9 mmol/L (ref 3.5–5.1)
Sodium: 134 mmol/L — ABNORMAL LOW (ref 135–145)

## 2022-11-09 MED ORDER — TRAMADOL HCL 50 MG PO TABS
50.0000 mg | ORAL_TABLET | Freq: Four times a day (QID) | ORAL | Status: DC | PRN
  Administered 2022-11-09 (×2): 100 mg via ORAL
  Administered 2022-11-10: 50 mg via ORAL
  Filled 2022-11-09: qty 1
  Filled 2022-11-09 (×2): qty 2

## 2022-11-09 NOTE — Progress Notes (Signed)
1 Day Post-Op Robotic R colectomy Subjective: Feeling well, having BM's.  Min pain  Objective: Vital signs in last 24 hours: Temp:  [97.4 F (36.3 C)-98.8 F (37.1 C)] 98 F (36.7 C) (04/26 0902) Pulse Rate:  [46-70] 58 (04/26 0902) Resp:  [9-18] 18 (04/26 0902) BP: (91-139)/(59-89) 107/74 (04/26 0902) SpO2:  [97 %-100 %] 97 % (04/26 0902)   Intake/Output from previous day: 04/25 0701 - 04/26 0700 In: 4410.2 [P.O.:1257; I.V.:2603.2; IV Piggyback:550] Out: 1531 [Urine:1500; Stool:1; Blood:30] Intake/Output this shift: Total I/O In: -  Out: 300 [Urine:300]   General appearance: alert and cooperative GI: soft, non-distended  Incision: healing well  Lab Results:  Recent Labs    11/09/22 0449  WBC 12.0*  HGB 7.8*  HCT 25.6*  PLT 254   BMET Recent Labs    11/09/22 0449  NA 134*  K 3.9  CL 105  CO2 22  GLUCOSE 151*  BUN 10  CREATININE 0.74  CALCIUM 8.4*   PT/INR No results for input(s): "LABPROT", "INR" in the last 72 hours. ABG No results for input(s): "PHART", "HCO3" in the last 72 hours.  Invalid input(s): "PCO2", "PO2"  MEDS, Scheduled  alvimopan  12 mg Oral BID   enoxaparin (LOVENOX) injection  40 mg Subcutaneous Q24H   feeding supplement  237 mL Oral BID BM   finasteride  5 mg Oral QHS   gabapentin  600 mg Oral BID   melatonin  5 mg Oral QHS   metoprolol succinate  25 mg Oral Daily   pantoprazole  80 mg Oral Daily   saccharomyces boulardii  250 mg Oral BID   tamsulosin  0.4 mg Oral Daily    Studies/Results: No results found.  Assessment: s/p Procedure(s): XI ROBOT ASSISTED RIGHT COLECTOMY Patient Active Problem List   Diagnosis Date Noted   Colon cancer (HCC) 11/08/2022   Colonic mass 09/29/2022   IDA (iron deficiency anemia) 09/26/2022   Acute blood loss anemia 09/26/2022   History of cancer of lower lobe bronchus or lung - s/p left lower lobectomy at [facility] 09-07-2021 09/26/2022   Superficial vein thrombosis 09/24/2022   Neuropathic  pain 08/31/2022   Neurogenic bladder 08/31/2022   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   Cough 07/20/2022   Encounter for general adult medical examination with abnormal findings 06/27/2022   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) 05/23/2021   Former smoker 05/05/2021   Elevated PSA 02/16/2021   Erectile dysfunction due to arterial insufficiency 05/11/2020   Benign prostatic hyperplasia with urinary obstruction 04/11/2020   DDD (degenerative disc disease), cervical 06/11/2018   DDD (degenerative disc disease), lumbar 06/11/2018   History of kidney stones 06/11/2018   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HCC) 06/11/2018   Primary osteoarthritis of left knee 06/11/2018   Primary osteoarthritis of both feet 06/11/2018   Plantar fasciitis 06/11/2018   Hx of colonic polyp 05/29/2018   Left lumbar radiculopathy 01/07/2018   Diastolic dysfunction 10/15/2016   Psoriasis 10/15/2016   GERD 10/02/2007   Prolapsed internal hemorrhoids, grade 2 and 3 09/10/2006   SLEEP APNEA 09/10/2006    Expected post op course  Plan: Advance diet Cont to ambulate D/c tomorrow if continues to do well   LOS: 1 day     .Vanita Panda, MD [facility], [place]    11/09/2022 11:37 AM

## 2022-11-09 NOTE — Discharge Instructions (Signed)
SURGERY: POST OP INSTRUCTIONS (Surgery for small bowel obstruction, colon resection, etc)   ######################################################################  EAT Gradually transition to a high fiber diet with a fiber supplement over the next few days after discharge  WALK Walk an hour a day.  Control your pain to do that.    CONTROL PAIN Control pain so that you can walk, sleep, tolerate sneezing/coughing, go up/down stairs.  HAVE A BOWEL MOVEMENT DAILY Keep your bowels regular to avoid problems.  OK to try a laxative to override constipation.  OK to use an antidairrheal to slow down diarrhea.  Call if not better after 2 tries  CALL IF YOU HAVE PROBLEMS/CONCERNS Call if you are still struggling despite following these instructions. Call if you have concerns not answered by these instructions  ######################################################################   DIET Follow a light diet the first few days at home.  Start with a bland diet such as soups, liquids, starchy foods, low fat foods, etc.  If you feel full, bloated, or constipated, stay on a ful liquid or pureed/blenderized diet for a few days until you feel better and no longer constipated. Be sure to drink plenty of fluids every day to avoid getting dehydrated (feeling dizzy, not urinating, etc.). Gradually add a fiber supplement to your diet over the next week.  Gradually get back to a regular solid diet.  Avoid fast food or heavy meals the first week as you are more likely to get nauseated. It is expected for your digestive tract to need a few months to get back to normal.  It is common for your bowel movements and stools to be irregular.  You will have occasional bloating and cramping that should eventually fade away.  Until you are eating solid food normally, off all pain medications, and back to regular activities; your bowels will not be normal. Focus on eating a low-fat, high fiber diet the rest of your life  (See Getting to Good Bowel Health, below).  CARE of your INCISION or WOUND  It is good for closed incisions and even open wounds to be washed every day.  Shower every day.  Short baths are fine.  Wash the incisions and wounds clean with soap & water.    You may leave closed incisions open to air if it is dry.   You may cover the incision with clean gauze & replace it after your daily shower for comfort.  STAPLES: You have skin staples.  Leave them in place & set up an appointment for them to be removed by a surgery office nurse ~10 days after surgery. = 1st week of January 2024    ACTIVITIES as tolerated Start light daily activities --- self-care, walking, climbing stairs-- beginning the day after surgery.  Gradually increase activities as tolerated.  Control your pain to be active.  Stop when you are tired.  Ideally, walk several times a day, eventually an hour a day.   Most people are back to most day-to-day activities in a few weeks.  It takes 4-8 weeks to get back to unrestricted, intense activity. If you can walk 30 minutes without difficulty, it is safe to try more intense activity such as jogging, treadmill, bicycling, low-impact aerobics, swimming, etc. Save the most intensive and strenuous activity for last (Usually 4-8 weeks after surgery) such as sit-ups, heavy lifting, contact sports, etc.  Refrain from any intense heavy lifting or straining until you are off narcotics for pain control.  You will have off days, but things should improve   week-by-week. DO NOT PUSH THROUGH PAIN.  Let pain be your guide: If it hurts to do something, don't do it.  Pain is your body warning you to avoid that activity for another week until the pain goes down. You may drive when you are no longer taking narcotic prescription pain medication, you can comfortably wear a seatbelt, and you can safely make sudden turns/stops to protect yourself without hesitating due to pain. You may have sexual intercourse when it  is comfortable. If it hurts to do something, stop.  MEDICATIONS Take your usually prescribed home medications unless otherwise directed.   Blood thinners:  Usually you can restart any strong blood thinners after the second postoperative day.  It is OK to take aspirin right away.     If you are on strong blood thinners (warfarin/Coumadin, Plavix, Xerelto, Eliquis, Pradaxa, etc), discuss with your surgeon, medicine PCP, and/or cardiologist for instructions on when to restart the blood thinner & if blood monitoring is needed (PT/INR blood check, etc).     PAIN CONTROL Pain after surgery or related to activity is often due to strain/injury to muscle, tendon, nerves and/or incisions.  This pain is usually short-term and will improve in a few months.  To help speed the process of healing and to get back to regular activity more quickly, DO THE FOLLOWING THINGS TOGETHER: Increase activity gradually.  DO NOT PUSH THROUGH PAIN Use Ice and/or Heat Try Gentle Massage and/or Stretching Take over the counter pain medication Take Narcotic prescription pain medication for more severe pain  Good pain control = faster recovery.  It is better to take more medicine to be more active than to stay in bed all day to avoid medications.  Increase activity gradually Avoid heavy lifting at first, then increase to lifting as tolerated over the next 6 weeks. Do not "push through" the pain.  Listen to your body and avoid positions and maneuvers than reproduce the pain.  Wait a few days before trying something more intense Walking an hour a day is encouraged to help your body recover faster and more safely.  Start slowly and stop when getting sore.  If you can walk 30 minutes without stopping or pain, you can try more intense activity (running, jogging, aerobics, cycling, swimming, treadmill, sex, sports, weightlifting, etc.) Remember: If it hurts to do it, then don't do it! Use Ice and/or Heat You will have swelling and  bruising around the incisions.  This will take several weeks to resolve. Ice packs or heating pads (6-8 times a day, 30-60 minutes at a time) will help sooth soreness & bruising. Some people prefer to use ice alone, heat alone, or alternate between ice & heat.  Experiment and see what works best for you.  Consider trying ice for the first few days to help decrease swelling and bruising; then, switch to heat to help relax sore spots and speed recovery. Shower every day.  Short baths are fine.  It feels good!  Keep the incisions and wounds clean with soap & water.   Try Gentle Massage and/or Stretching Massage at the area of pain many times a day Stop if you feel pain - do not overdo it Take over the counter pain medication This helps the muscle and nerve tissues become less irritable and calm down faster Choose ONE of the following over-the-counter anti-inflammatory medications: Acetaminophen 500mg tabs (Tylenol) 1-2 pills with every meal and just before bedtime (avoid if you have liver problems or if you have   acetaminophen in you narcotic prescription) Naproxen 220mg tabs (ex. Aleve, Naprosyn) 1-2 pills twice a day (avoid if you have kidney, stomach, IBD, or bleeding problems) Ibuprofen 200mg tabs (ex. Advil, Motrin) 3-4 pills with every meal and just before bedtime (avoid if you have kidney, stomach, IBD, or bleeding problems) Take with food/snack several times a day as directed for at least 2 weeks to help keep pain / soreness down & more manageable. Take Narcotic prescription pain medication for more severe pain A prescription for strong pain control is often given to you upon discharge (for example: oxycodone/Percocet, hydrocodone/Norco/Vicodin, or tramadol/Ultram) Take your pain medication as prescribed. Be mindful that most narcotic prescriptions contain Tylenol (acetaminophen) as well - avoid taking too much Tylenol. If you are having problems/concerns with the prescription medicine (does  not control pain, nausea, vomiting, rash, itching, etc.), please call us [phone] to see if we need to switch you to a different pain medicine that will work better for you and/or control your side effects better. If you need a refill on your pain medication, you must call the office before 4 pm and on weekdays only.  By federal law, prescriptions for narcotics cannot be called into a pharmacy.  They must be filled out on paper & picked up from our office by the patient or authorized caretaker.  Prescriptions cannot be filled after 4 pm nor on weekends.    WHEN TO CALL US [phone] Severe uncontrolled or worsening pain  Fever over 101 F (38.5 C) Concerns with the incision: Worsening pain, redness, rash/hives, swelling, bleeding, or drainage Reactions / problems with new medications (itching, rash, hives, nausea, etc.) Nausea and/or vomiting Difficulty urinating Difficulty breathing Worsening fatigue, dizziness, lightheadedness, blurred vision Other concerns If you are not getting better after two weeks or are noticing you are getting worse, contact our office [phone] for further advice.  We may need to adjust your medications, re-evaluate you in the office, send you to the emergency room, or see what other things we can do to help. The clinic staff is available to answer your questions during regular business hours (8:30am-5pm).  Please don't hesitate to call and ask to speak to one of our nurses for clinical concerns.    A surgeon from [facility] is always on call at the hospitals 24 hours/day If you have a medical emergency, go to the nearest emergency room or call 911.  FOLLOW UP in our office One the day of your discharge from the hospital (or the next business weekday), please call [facility] to set up or confirm an appointment to see your surgeon in the office for a follow-up appointment.  Usually it is 2-3 weeks after your surgery.   If you  have skin staples at your incision(s), let the office know so we can set up a time in the office for the nurse to remove them (usually around 10 days after surgery). Make sure that you call for appointments the day of discharge (or the next business weekday) from the hospital to ensure a convenient appointment time. IF YOU HAVE DISABILITY OR FAMILY LEAVE FORMS, BRING THEM TO THE OFFICE FOR PROCESSING.  DO NOT GIVE THEM TO YOUR DOCTOR.  [facility] [address], [address], [place], [place]  [address] ? [phone] - Main [phone] - Toll Free,  [phone] - Fax [email or web address]    GETTING TO GOOD BOWEL HEALTH. It is expected for your digestive tract to   need a few months to get back to normal.  It is common for your bowel movements and stools to be irregular.  You will have occasional bloating and cramping that should eventually fade away.  Until you are eating solid food normally, off all pain medications, and back to regular activities; your bowels will not be normal.   Avoiding constipation The goal: ONE SOFT BOWEL MOVEMENT A DAY!    Drink plenty of fluids.  Choose water first. TAKE A FIBER SUPPLEMENT EVERY DAY THE REST OF YOUR LIFE During your first week back home, gradually add back a fiber supplement every day Experiment which form you can tolerate.   There are many forms such as powders, tablets, wafers, gummies, etc Psyllium bran (Metamucil), methylcellulose (Citrucel), Miralax or Glycolax, Benefiber, Flax Seed.  Adjust the dose week-by-week (1/2 dose/day to 6 doses a day) until you are moving your bowels 1-2 times a day.  Cut back the dose or try a different fiber product if it is giving you problems such as diarrhea or bloating. Sometimes a laxative is needed to help jump-start bowels if constipated until the fiber supplement can help regulate your bowels.  If you are tolerating eating & you are farting, it is okay to try a gentle  laxative such as double dose MiraLax, prune juice, or Milk of Magnesia.  Avoid using laxatives too often. Stool softeners can sometimes help counteract the constipating effects of narcotic pain medicines.  It can also cause diarrhea, so avoid using for too long. If you are still constipated despite taking fiber daily, eating solids, and a few doses of laxatives, call our office. Controlling diarrhea Try drinking liquids and eating bland foods for a few days to avoid stressing your intestines further. Avoid dairy products (especially milk & ice cream) for a short time.  The intestines often can lose the ability to digest lactose when stressed. Avoid foods that cause gassiness or bloating.  Typical foods include beans and other legumes, cabbage, broccoli, and dairy foods.  Avoid greasy, spicy, fast foods.  Every person has some sensitivity to other foods, so listen to your body and avoid those foods that trigger problems for you. Probiotics (such as active yogurt, Align, etc) may help repopulate the intestines and colon with normal bacteria and calm down a sensitive digestive tract Adding a fiber supplement gradually can help thicken stools by absorbing excess fluid and retrain the intestines to act more normally.  Slowly increase the dose over a few weeks.  Too much fiber too soon can backfire and cause cramping & bloating. It is okay to try and slow down diarrhea with a few doses of antidiarrheal medicines.   Bismuth subsalicylate (ex. Kayopectate, Pepto Bismol) for a few doses can help control diarrhea.  Avoid if pregnant.   Loperamide (Imodium) can slow down diarrhea.  Start with one tablet (2mg) first.  Avoid if you are having fevers or severe pain.  ILEOSTOMY PATIENTS WILL HAVE CHRONIC DIARRHEA since their colon is not in use.    Drink plenty of liquids.  You will need to drink even more glasses of water/liquid a day to avoid getting dehydrated. Record output from your ileostomy.  Expect to empty  the bag every 3-4 hours at first.  Most people with a permanent ileostomy empty their bag 4-6 times at the least.   Use antidiarrheal medicine (especially Imodium) several times a day to avoid getting dehydrated.  Start with a dose at bedtime & breakfast.  Adjust up or   down as needed.  Increase antidiarrheal medications as directed to avoid emptying the bag more than 8 times a day (every 3 hours). Work with your wound ostomy nurse to learn care for your ostomy.  See ostomy care instructions. TROUBLESHOOTING IRREGULAR BOWELS 1) Start with a soft & bland diet. No spicy, greasy, or fried foods.  2) Avoid gluten/wheat or dairy products from diet to see if symptoms improve. 3) Miralax 17gm or flax seed mixed in 8oz. water or juice-daily. May use 2-4 times a day as needed. 4) Gas-X, Phazyme, etc. as needed for gas & bloating.  5) Prilosec (omeprazole) over-the-counter as needed 6)  Consider probiotics (Align, Activa, etc) to help calm the bowels down  Call your doctor if you are getting worse or not getting better.  Sometimes further testing (cultures, endoscopy, X-ray studies, CT scans, bloodwork, etc.) may be needed to help diagnose and treat the cause of the diarrhea. [facility] [address], [address], [place], [place]  [address] [phone] - Main.    [phone]  - Toll Free.   [phone] - Fax [email or web address]   ###############################   #######################################################  Ostomy Support Information  You've heard that people get along just fine with only one of their eyes, or one of their lungs, or one of their kidneys. But you also know that you have only one intestine and only one bladder, and that leaves you feeling awfully empty, both physically and emotionally: You think no other people go around without part of their intestine with the ends of their intestines sticking out through their abdominal walls.    YOU ARE NOT ALONE.  There are nearly three quarters of a million people in the [place] who have an ostomy; people who have had surgery to remove all or part of their colons or bladders.   There is even a national association, the [employer] with over 350 local affiliated support groups that are organized by volunteers who provide peer support and counseling. [employer] has a toll free telephone num-ber, 800-826-0826 and an educational, interactive website, www.ostomy.org   An ostomy is an opening in the belly (abdominal wall) made by surgery. Ostomates are people who have had this procedure. The opening (stoma) allows the kidney or bowel to grdischarge waste. An external pouch covers the stoma to collect waste. Pouches are are a simple bag and are odor free. Different companies have disposable or reusable pouches to fit one's lifestyle. An ostomy can either be temporary or permanent.   THERE ARE THREE MAIN TYPES OF OSTOMIES Colostomy. A colostomy is a surgically created opening in the large intestine (colon). Ileostomy. An ileostomy is a surgically created opening in the small intestine. Urostomy. A urostomy is a surgically created opening to divert urine away from the bladder.  OSTOMY Care  The following guidelines will make care of your colostomy easier. Keep this information close by for quick reference.  Helpful DIET hints Eat a well-balanced diet including vegetables and fresh fruits. Eat on a regular schedule.  Drink at least 6 to 8 glasses of fluids daily. Eat slowly in a relaxed atmosphere. Chew your food thoroughly. Avoid chewing gum, smoking, and drinking from a straw. This will help decrease the amount of air you swallow, which may help reduce gas. Eating yogurt or drinking buttermilk may help reduce gas.  To control gas at night, do not eat after 8 p.m. This will give your bowel time to quiet down before you go   to bed.  If gas is a problem, you can purchase  Beano. Sprinkle Beano on the first bite of food before eating to reduce gas. It has no flavor and should not change the taste of your food. You can buy Beano over the counter at your local drugstore.  Foods like fish, onions, garlic, broccoli, asparagus, and cabbage produce odor. Although your pouch is odor-proof, if you eat these foods you may notice a stronger odor when emptying your pouch. If this is a concern, you may want to limit these foods in your diet.  If you have an ileostomy, you will have chronic diarrhea & need to drink more liquids to avoid getting dehydrated.  Consider antidiarrheal medicine like imodium (loperamide) or Lomotil to help slow down bowel movements / diarrhea into your ileostomy bag.  GETTING TO GOOD BOWEL HEALTH WITH AN ILEOSTOMY    With the colon bypassed & not in use, you will have small bowel diarrhea.   It is important to thicken & slow your bowel movements down.   The goal: 4-6 small BOWEL MOVEMENTS A DAY It is important to drink plenty of liquids to avoid getting dehydrated  CONTROLLING ILEOSTOMY DIARRHEA  TAKE A FIBER SUPPLEMENT (FiberCon or Benefiner soluble fiber) twice a day - to thicken stools by absorbing excess fluid and retrain the intestines to act more normally.  Slowly increase the dose over a few weeks.  Too much fiber too soon can backfire and cause cramping & bloating.  TAKE AN IRON SUPPLEMENT twice a day to naturally constipate your bowels.  Usually ferrous sulfate 325mg twice a day)  TAKE ANTI-DIARRHEAL MEDICINES: Loperamide (Imodium) can slow down diarrhea.  Start with two tablets (= 4mg) first and then try one tablet every 6 hours.  Can go up to 2 pills four times day (8 pills of 2mg max) Avoid if you are having fevers or severe pain.  If you are not better or start feeling worse, stop all medicines and call your doctor for advice LoMotil (Diphenoxylate / Atropine) is another medicine that can constipate & slow down bowel moevements Pepto  Bismol (bismuth) can gently thicken bowels as well  If diarrhea is worse,: drink plenty of liquids and try simpler foods for a few days to avoid stressing your intestines further. Avoid dairy products (especially milk & ice cream) for a short time.  The intestines often can lose the ability to digest lactose when stressed. Avoid foods that cause gassiness or bloating.  Typical foods include beans and other legumes, cabbage, broccoli, and dairy foods.  Every person has some sensitivity to other foods, so listen to our body and avoid those foods that trigger problems for you.Call your doctor if you are getting worse or not better.  Sometimes further testing (cultures, endoscopy, X-ray studies, bloodwork, etc) may be needed to help diagnose and treat the cause of the diarrhea. Take extra anti-diarrheal medicines (maximum is 8 pills of 2mg loperamide a day)   Tips for POUCHING an OSTOMY   Changing Your Pouch The best time to change your pouch is in the morning, before eating or drinking anything. Your stoma can function at any time, but it will function more after eating or drinking.   Applying the pouching system  Place all your equipment close at hand before removing your pouch.  Wash your hands.  Stand or sit in front of a mirror. Use the position that works best for you. Remember that you must keep the skin around the stoma   wrinkle-free for a good seal.  Gently remove the used pouch (1-piece system) or the pouch and old wafer (2-piece system). Empty the pouch into the toilet. Save the closure clip to use again.  Wash the stoma itself and the skin around the stoma. Your stoma may bleed a little when being washed. This is normal. Rinse and pat dry. You may use a wash cloth or soft paper towels (like Bounty), mild soap (like Dial, Safeguard, or Ivory), and water. Avoid soaps that contain perfumes or lotions.  For a new pouch (1-piece system) or a new wafer (2-piece system), measure your  stoma using the stoma guide in each box of supplies.  Trace the shape of your stoma onto the back of the new pouch or the back of the new wafer. Cut out the opening. Remove the paper backing and set it aside.  Optional: Apply a skin barrier powder to surrounding skin if it is irritated (bare or weeping), and dust off the excess. Optional: Apply a skin-prep wipe (such as Skin Prep or All-Kare) to the skin around the stoma, and let it dry. Do not apply this solution if the skin is irritated (red, tender, or broken) or if you have shaved around the stoma. Optional: Apply a skin barrier paste (such as Stomahesive, Coloplast, or Premium) around the opening cut in the back of the pouch or wafer. Allow it to dry for 30 to 60 seconds.  Hold the pouch (1-piece system) or wafer (2-piece system) with the sticky side toward your body. Make sure the skin around the stoma is wrinkle-free. Center the opening on the stoma, then press firmly to your abdomen (Fig. 4). Look in the mirror to check if you are placing the pouch, or wafer, in the right position. For a 2-piece system, snap the pouch onto the wafer. Make sure it snaps into place securely.  Place your hand over the stoma and the pouch or wafer for about 30 seconds. The heat from your hand can help the pouch or wafer stick to your skin.  Add deodorant (such as Super Banish or Nullo) to your pouch. Other options include food extracts such as vanilla oil and peppermint extract. Add about 10 drops of the deodorant to the pouch. Then apply the closure clamp. Note: Do not use toxic  chemicals or commercial cleaning agents in your pouch. These substances may harm the stoma.  Optional: For extra seal, apply tape to all 4 sides around the pouch or wafer, as if you were framing a picture. You may use any brand of medical adhesive tape. Change your pouch every 5 to 7 days. Change it immediately if a leak occurs.  Wash your hands afterwards.  If you are wearing a  2-piece system, you may use 2 new pouches per week and alternate them. Rinse the pouch with mild soap and warm water and hang it to dry for the next day. Apply the fresh pouch. Alternate the 2 pouches like this for a week. After a week, change the wafer and begin with 2 new pouches. Place the old pouches in a plastic bag, and put them in the trash.   LIVING WITH AN OSTOMY  Emptying Your Pouch Empty your pouch when it is one-third full (of urine, stool, and/or gas). If you wait until your pouch is fuller than this, it will be more difficult to empty and more noticeable. When you empty your pouch, either put toilet paper in the toilet bowl first, or flush the   toilet while you empty the pouch. This will reduce splashing. You can empty the pouch between your legs or to one side while sitting, or while standing or stooping. If you have a 2-piece system, you can snap off the pouch to empty it. Remember that your stoma may function during this time. If you wish to rinse your pouch after you empty it, a turkey baster can be helpful. When using a baster, squirt water up into the pouch through the opening at the bottom. With a 2-piece system, you can snap off the pouch to rinse it. After rinsing  your pouch, empty it into the toilet. When rinsing your pouch at home, put a few granules of Dreft soap in the rinse water. This helps lubricate and freshen your pouch. The inside of your pouch can be sprayed with non-stick cooking oil (Pam spray). This may help reduce stool sticking to the inside of the pouch.  Bathing You may shower or bathe with your pouch on or off. Remember that your stoma may function during this time.  The materials you use to wash your stoma and the skin around it should be clean, but they do not need to be sterile.  Wearing Your Pouch During hot weather, or if you perspire a lot in general, wear a cover over your pouch. This may prevent a rash on your skin under the pouch. Pouch covers are  sold at ostomy supply stores. Wear the pouch inside your underwear for better support. Watch your weight. Any gain or loss of 10 to 15 pounds or more can change the way your pouch fits.  Going Away From Home A collapsible cup (like those that come in travel kits) or a soft plastic squirt bottle with a pull-up top (like a travel bottle for shampoo) can be used for rinsing your pouch when you are away from home. Tilt the opening of the pouch at an upward angle when using a cup to rinse.  Carry wet wipes or extra tissues to use in public bathrooms.  Carry an extra pouching system with you at all times.  Never keep ostomy supplies in the glove compartment of your car. Extreme heat or cold can damage the skin barriers and adhesive wafers on the pouch.  When you travel, carry your ostomy supplies with you at all times. Keep them within easy reach. Do not pack ostomy supplies in baggage that will be checked or otherwise separated from you, because your baggage might be lost. If you're traveling out of the country, it is helpful to have a letter stating that you are carrying ostomy supplies as a medical necessity.  If you need ostomy supplies while traveling, look in the yellow pages of the telephone book under "Surgical Supplies." Or call the local ostomy organization to find out where supplies are available.  Do not let your ostomy supplies get low. Always order new pouches before you use the last one.  Reducing Odor Limit foods such as broccoli, cabbage, onions, fish, and garlic in your diet to help reduce odor. Each time you empty your pouch, carefully clean the opening of the pouch, both inside and outside, with toilet paper. Rinse your pouch 1 or 2 times daily after you empty it (see directions for emptying your pouch and going away from home). Add deodorant (such as Super Banish or Nullo) to your pouch. Use air deodorizers in your bathroom. Do not add aspirin to your pouch. Even though  aspirin can help prevent odor, it   could cause ulcers on your stoma.  When to call the doctor Call the doctor if you have any of the following symptoms: Purple, black, or white stoma Severe cramps lasting more than 6 hours Severe watery discharge from the stoma lasting more than 6 hours No output from the colostomy for 3 days Excessive bleeding from your stoma Swelling of your stoma to more than 1/2-inch larger than usual Pulling inward of your stoma below skin level Severe skin irritation or deep ulcers Bulging or other changes in your abdomen  When to call your ostomy nurse Call your ostomy/enterostomal therapy (WOCN) nurse if any of the following occurs: Frequent leaking of your pouching system Change in size or appearance of your stoma, causing discomfort or problems with your pouch Skin rash or rawness Weight gain or loss that causes problems with your pouch     FREQUENTLY ASKED QUESTIONS   Why haven't you met any of these folks who have an ostomy?  Well, maybe you have! You just did not recognize them because an ostomy doesn't show. It can be kept secret if you wish. Why, maybe some of your best friends, office associates or neighbors have an ostomy ... you never can tell. People facing ostomy surgery have many quality-of-life questions like: Will you bulge? Smell? Make noises? Will you feel waste leaving your body? Will you be a captive of the toilet? Will you starve? Be a social outcast? Get/stay married? Have babies? Easily bathe, go swimming, bend over?  OK, let's look at what you can expect:   Will you bulge?  Remember, without part of the intestine or bladder, and its contents, you should have a flatter tummy than before. You can expect to wear, with little exception, what you wore before surgery ... and this in-cludes tight clothing and bathing suits.   Will you smell?  Today, thanks to modern odor proof pouching systems, you can walk into an ostomy support group  meeting and not smell anything that is foul or offensive. And, for those with an ileostomy or colostomy who are concerned about odor when emptying their pouch, there are in-pouch deodorants that can be used to eliminate any waste odors that may exist.   Will you make noises?  Everyone produces gas, especially if they are an air-swallower. But intestinal sounds that occur from time to time are no differ-ent than a gurgling tummy, and quite often your clothing will muffle any sounds.   Will you feel the waste discharges?  For those with a colostomy or ileostomy there might be a slight pressure when waste leaves your body, but understand that the intestines have no nerve endings, so there will be no unpleasant sensations. Those with a urostomy will probably be unaware of any kidney drainage.   Will you be a captive of the toilet?  Immediately post-op you will spend more time in the bathroom than you will after your body recovers from surgery. Every person is different, but on average those with an ileostomy or urostomy may empty their pouches 4 to 6 times a day; a little  less if you have a colostomy. The average wear time between pouch system changes is 3 to 5 days and the changing process should take less than 30 minutes.   Will I need to be on a special diet? Most people return to their normal diet when they have recovered from surgery. Be sure to chew your food well, eat a well-balanced diet and drink plenty of fluids. If   you experience problems with a certain food, wait a couple of weeks and try it again.  Will there be odor and noises? Pouching systems are designed to be odor-proof or odor-resistant. There are deodorants that can be used in the pouch. Medications are also available to help reduce odor. Limit gas-producing foods and carbonated beverages. You will experience less gas and fewer noises as you heal from surgery.  How much time will it take to care for my ostomy? At first, you may  spend a lot of time learning about your ostomy and how to take care of it. As you become more comfortable and skilled at changing the pouching system, it will take very little time to care for it.   Will I be able to return to work? People with ostomies can perform most jobs. As soon as you have healed from surgery, you should be able to return to work. Heavy lifting (more than 10 pounds) may be discouraged.   What about intimacy? Sexual relationships and intimacy are important and fulfilling aspects of your life. They should continue after ostomy surgery. Intimacy-related concerns should be discussed openly between you and your partner.   Can I wear regular clothing? You do not need to wear special clothing. Ostomy pouches are fairly flat and barely noticeable. Elastic undergarments will not hurt the stoma or prevent the ostomy from functioning.   Can I participate in sports? An ostomy should not limit your involvement in sports. Many people with ostomies are runners, skiers, swimmers or participate in other active lifestyles. Talk with your caregiver first before doing heavy physical activity.  Will you starve?  Not if you follow doctor's orders at each stage of your post-op adjustment. There is no such thing as an "ostomy diet". Some people with an ostomy will be able to eat and tolerate anything; others may find diffi-culty with some foods. Each person is an individual and must determine, by trial, what is best for them. A good practice for all is to drink plenty of water.   Will you be a social outcast?  Have you met anyone who has an ostomy and is a social outcast? Why should you be the first? Only your attitude and self image will effect how you are treated. No confi-dent person is an outcast.    PROFESSIONAL HELP   Resources are available if you need help or have questions about your ostomy.   Specially trained nurses called Wound, Ostomy Continence Nurses (WOCN) are available for  consultation in most major medical centers.  Consider getting an ostomy consult at an outpatient ostomy clinic.   [facility] has an Ostomy Clinic run by an WOCN ostomy nurse at the [facility].  [phone]. [facility] can help set up an appointment   The United Ostomy Association (UOA) is a group made up of many local chapters throughout the [place]. These local groups hold meetings and provide support to prospective and existing ostomates. They sponsor educational events and have qualified visitors to make personal or telephone visits. Contact the UOA for the chapter nearest you and for other educational publications.  More detailed information can be found in Colostomy Guide, a publication of the United Ostomy Association (UOA). Contact UOA at 1-800-826-0826 or visit their web site at www.uoaa.org. The website contains links to other sites, suppliers and resources.  [employer] Secure Start Services: Start at the website to enlist for support.  Your Wound Ostomy (WOCN) nurse may have started this   process. https://www.[employer].com/en/securestart Secure Start services are designed to support people as they live their lives with an ostomy or neurogenic bladder. Enrolling is easy and at no cost to the patient. We realize that each person's needs and life journey are different. Through Secure Start services, we want to help people live their life, their way.  #######################################################  

## 2022-11-09 NOTE — TOC CM/SW Note (Signed)
Transition of Care [facility]) Screening Note  Patient Details  Name: Russell [name] Date of Birth: April 21, 1946  Transition of Care [facility]) CM/SW Contact:    Ewing Schlein, LCSW Phone Number: 11/09/2022, 11:27 AM  Transition of Care Department [facility]) has reviewed patient and no TOC needs have been identified at this time. We will continue to monitor patient advancement through interdisciplinary progression rounds. If new patient transition needs arise, please place a TOC consult.

## 2022-11-10 LAB — CBC
HCT: 26.1 % — ABNORMAL LOW (ref 39.0–52.0)
Hemoglobin: 7.9 g/dL — ABNORMAL LOW (ref 13.0–17.0)
MCH: 21.1 pg — ABNORMAL LOW (ref 26.0–34.0)
MCHC: 30.3 g/dL (ref 30.0–36.0)
MCV: 69.6 fL — ABNORMAL LOW (ref 80.0–100.0)
Platelets: 252 10*3/uL (ref 150–400)
RBC: 3.75 MIL/uL — ABNORMAL LOW (ref 4.22–5.81)
RDW: 26.2 % — ABNORMAL HIGH (ref 11.5–15.5)
WBC: 10.2 10*3/uL (ref 4.0–10.5)
nRBC: 0 % (ref 0.0–0.2)

## 2022-11-10 LAB — BASIC METABOLIC PANEL
Anion gap: 10 (ref 5–15)
BUN: 11 mg/dL (ref 8–23)
CO2: 23 mmol/L (ref 22–32)
Calcium: 8.6 mg/dL — ABNORMAL LOW (ref 8.9–10.3)
Chloride: 101 mmol/L (ref 98–111)
Creatinine, Ser: 0.82 mg/dL (ref 0.61–1.24)
GFR, Estimated: >60 mL/min (ref >60)
Glucose, Bld: 104 mg/dL — ABNORMAL HIGH (ref 70–99)
Potassium: 3.7 mmol/L (ref 3.5–5.1)
Sodium: 134 mmol/L — ABNORMAL LOW (ref 135–145)

## 2022-11-10 MED ORDER — OXYCODONE HCL 5 MG PO TABS: 5.0000 mg | ORAL_TABLET | ORAL | 0 refills | Status: DC | PRN

## 2022-11-10 NOTE — Progress Notes (Signed)
AVS given to pt.  All questions answered.  Pt had all belongings.  Pt stable at time of discharge.  

## 2022-11-10 NOTE — Progress Notes (Signed)
Pharmacy Brief Note - Alvimopan (Entereg)  The standing order set for alvimopan (Entereg) now includes an automatic order to discontinue the drug after the patient has had a bowel movement. The change was approved by the Pharmacy & Therapeutics Committee and the Medical Executive Committee.   This patient has had bowel movements documented by nursing. Therefore, alvimopan has been discontinued. If there are questions, please contact the pharmacy at [phone].   Thank you-   Dorna Leitz, PharmD, BCPS 11/10/2022 10:00 AM

## 2022-11-10 NOTE — Discharge Summary (Signed)
Physician Discharge Summary  Patient ID: DA AUTHEMENT MRN: [id number] DOB/AGE: 20-Oct-1945 77 y.o.  Admit date: 11/08/2022 Discharge date: 11/10/2022  Admission Diagnoses: Colon cancer  Discharge Diagnoses:  Principal Problem:   Colon cancer [facility])   Discharged Condition: poor  Hospital Course: Patient did well after robotic right hemicolectomy.  His bowels are moving on postop day 1.  He is tolerating a diet and had good pain control.  His wounds are clean dry intact he was ambulating and taking p.o. pain meds and oral intake without difficulty.     Treatments: surgery: Robotic right colectomy  Discharge Exam: Blood pressure 107/68, pulse 68, temperature 98.4 F (36.9 C), temperature source Oral, resp. rate 15, height 6' (1.829 m), weight 82.7 kg, SpO2 97 %. General appearance: alert Resp: clear to auscultation bilaterally Cardio: Normal sinus rhythm Incision/Wound: Honeycomb dressing intact.  Otherwise clean incisions without any significant abdominal distention, rebound, guarding  Disposition: Discharge disposition: 01-Home or Self Care       Discharge Instructions     Diet - low sodium heart healthy   Complete by: As directed    Increase activity slowly   Complete by: As directed       Allergies as of 11/10/2022   No Known Allergies      Medication List     STOP taking these medications    polyethylene glycol powder 17 GM/SCOOP powder Commonly known as: GLYCOLAX/MIRALAX       TAKE these medications    ASHWAGANDHA PO Take 150 mg by mouth in the morning and at bedtime.   aspirin EC 81 MG tablet Take 1 tablet (81 mg total) by mouth every evening.   finasteride 5 MG tablet Commonly known as: PROSCAR Take 1 tablet (5 mg total) by mouth daily. What changed: when to take this   FISH OIL PO Take 460 mg by mouth 2 (two) times daily.   gabapentin 300 MG capsule Commonly known as: NEURONTIN Take 1 capsule (300 mg total) by mouth 3 (three) times  daily. (Actually 300 mg in AM and 600 mg QHS) x 1 week, then 600 mg BID- for nerve pain What changed:  how much to take when to take this additional instructions   iron polysaccharides 150 MG capsule Commonly known as: NIFEREX Take 1 capsule (150 mg total) by mouth daily. What changed: when to take this   Magnesium 400 MG Caps Take 400 mg by mouth 2 (two) times daily. asporotate   melatonin 5 MG Tabs Take 5 mg by mouth at bedtime.   metoprolol succinate 25 MG 24 hr tablet Commonly known as: Toprol XL Take 1 tablet (25 mg total) by mouth daily.   metroNIDAZOLE 0.75 % gel Commonly known as: METROGEL Apply to face daily for rosacea   omeprazole 40 MG capsule Commonly known as: PRILOSEC TAKE (1) CAPSULE BY MOUTH ONCE DAILY.   oxyCODONE 5 MG immediate release tablet Commonly known as: Roxicodone Take 1 tablet (5 mg total) by mouth every 4 (four) hours as needed for severe pain.   PROBIOTIC DAILY PO Take 1 tablet by mouth daily.   senna-docusate 8.6-50 MG tablet Commonly known as: Senokot-S Take 1-2 tablets by mouth at bedtime as needed for mild constipation.   silodosin 8 MG Caps capsule Commonly known as: RAPAFLO Take 1 capsule (8 mg total) by mouth 2 (two) times daily.   TURMERIC PO Take 500 mg by mouth 2 (two) times daily. curamed curcumin   Voltaren 1 % Gel Generic drug:  diclofenac Sodium Apply 2 g topically daily as needed (pain).        Follow-up Information     Romie Levee, MD. Schedule an appointment as soon as possible for a visit in 2 week(s).   Specialties: General Surgery, Colon and Rectal Surgery Contact information: [address] [place] [address] [phone]                 Signed: Clovis Pu Tymon Nemetz MD 11/10/2022, 10:25 AM

## 2022-11-12 ENCOUNTER — Telehealth: Payer: Self-pay | Admitting: *Deleted

## 2022-11-12 ENCOUNTER — Encounter: Payer: Self-pay | Admitting: *Deleted

## 2022-11-12 NOTE — Transitions of Care (Post Inpatient/ED Visit) (Signed)
   11/12/2022  Name: Russell [name] MRN: [id number] DOB: March 12, 1946  Today's TOC FU Call Status: Today's TOC FU Call Status:: Successful TOC FU Call Competed TOC FU Call Complete Date: 11/12/22  Transition Care Management Follow-up Telephone Call Date of Discharge: 11/10/22 Discharge Facility: [facility]) Type of Discharge: Inpatient Admission Primary Inpatient Discharge Diagnosis:: Colon Cancer How have you been since you were released from the hospital?: Better Any questions or concerns?: No  Items Reviewed: Did you receive and understand the discharge instructions provided?: Yes Medications obtained and verified?: Yes (Medications Reviewed) Any new allergies since your discharge?: No Dietary orders reviewed?: Yes Type of Diet Ordered:: low fiber Do you have support at home?: Yes People in Home: spouse Name of Support/Comfort Primary Source: [facility] and Equipment/Supplies: Were Home Health Services Ordered?: No Any new equipment or medical supplies ordered?: No  Functional Questionnaire: Do you need assistance with bathing/showering or dressing?: No Do you need assistance with meal preparation?: No Do you need assistance with eating?: No Do you have difficulty maintaining continence: No Do you need assistance with getting out of bed/getting out of a chair/moving?: No Do you have difficulty managing or taking your medications?: No  Follow up appointments reviewed: PCP Follow-up appointment confirmed?: NA Specialist Hospital Follow-up appointment confirmed?: Yes Date of Specialist follow-up appointment?: 11/27/22 Follow-Up Specialty Provider:: Dr Maisie Fus (gen surgeron) Do you need transportation to your follow-up appointment?: No Do you understand care options if your condition(s) worsen?: Yes-patient verbalized understanding  SDOH Interventions Today    Flowsheet Row Most Recent Value  SDOH Interventions   Transportation Interventions Intervention  Not Indicated  Financial Strain Interventions Intervention Not Indicated      Interventions Today    Flowsheet Row Most Recent Value  Chronic Disease   Chronic disease during today's visit Other  [Hospitalization for colon surgery to remove colon cancer]  Exercise Interventions   Exercise Discussed/Reviewed Physical Activity  Physical Activity Discussed/Reviewed Physical Activity Discussed, Physical Activity Reviewed  Education Interventions   Education Provided Provided Education  Provided Verbal Education On When to see the doctor, Mental Health/Coping with Illness, Medication, Nutrition  Pharmacy Interventions   Pharmacy Dicussed/Reviewed Medications and their functions, Pharmacy Topics Discussed, Pharmacy Topics Reviewed  Safety Interventions   Safety Discussed/Reviewed Safety Discussed      TOC Interventions Today    Flowsheet Row Most Recent Value  TOC Interventions   TOC Interventions Discussed/Reviewed TOC Interventions Discussed, TOC Interventions Reviewed, Post discharge activity limitations per provider      Demetrios Loll, BSN, RN-BC RN Care Coordinator [facility]  [facility] Direct Dial: [phone] Main #: [phone]

## 2022-11-13 ENCOUNTER — Encounter (HOSPITAL_COMMUNITY): Payer: Medicare PPO | Admitting: Physical Therapy

## 2022-11-14 ENCOUNTER — Other Ambulatory Visit: Payer: Self-pay

## 2022-11-14 NOTE — Progress Notes (Signed)
The proposed treatment discussed in conference is for discussion purpose only and is not a binding recommendation.  The patients have not been physically examined, or presented with their treatment options.  Therefore, final treatment plans cannot be decided.  

## 2022-11-15 ENCOUNTER — Encounter (HOSPITAL_COMMUNITY): Payer: Medicare PPO | Admitting: Physical Therapy

## 2022-11-15 LAB — SURGICAL PATHOLOGY

## 2022-11-19 ENCOUNTER — Encounter (HOSPITAL_COMMUNITY): Payer: Medicare PPO | Admitting: Physical Therapy

## 2022-11-30 ENCOUNTER — Encounter: Payer: Medicare PPO | Attending: Physical Medicine and Rehabilitation | Admitting: Physical Medicine and Rehabilitation

## 2022-11-30 ENCOUNTER — Encounter: Payer: Self-pay | Admitting: Physical Medicine and Rehabilitation

## 2022-11-30 VITALS — BP 111/75 | Ht 72.0 in | Wt 175.0 lb

## 2022-11-30 DIAGNOSIS — I10 Essential (primary) hypertension: Secondary | ICD-10-CM

## 2022-11-30 DIAGNOSIS — M21372 Foot drop, left foot: Secondary | ICD-10-CM | POA: Insufficient documentation

## 2022-11-30 DIAGNOSIS — C189 Malignant neoplasm of colon, unspecified: Secondary | ICD-10-CM | POA: Diagnosis not present

## 2022-11-30 DIAGNOSIS — K592 Neurogenic bowel, not elsewhere classified: Secondary | ICD-10-CM | POA: Insufficient documentation

## 2022-11-30 DIAGNOSIS — R197 Diarrhea, unspecified: Secondary | ICD-10-CM | POA: Diagnosis not present

## 2022-11-30 DIAGNOSIS — G834 Cauda equina syndrome: Secondary | ICD-10-CM | POA: Diagnosis not present

## 2022-11-30 DIAGNOSIS — M21371 Foot drop, right foot: Secondary | ICD-10-CM | POA: Insufficient documentation

## 2022-11-30 NOTE — Patient Instructions (Signed)
Pt is a 77 yr old male with hx of lung CA- s/p L lower lobectomy and cauda equina with saddle anesthesia, neurogenic bladder; no DM; has HTN; New stage II Colon CA- s/p resection 11/09/22 Wife also terminal cancer as well.     Here for f/u for Cauda equina syndrome  Take probiotic and prebiotic for diarrhea- continue to do so.   2. Titrate imodium- don't be automatic in taking-can take 8 pills/day.   3. Con't Gabapentin 300 mg TID- goes several weeks between nerve pain episodes- so con't current dose. Doesn't need refills today.   4. Con't Anusol suppository and external cream for hemorrhoids- and epsom salt baths.  Getting bathroom remodeled   5.  Con't Rapaflo for bladder- per Urology.   6.  Doesn't need Peristeen irrigation at this time- since has 1/2 anus sensation/motor movement.   7. Living with cauda equina syndrome-   Before surgery walking 1.5 miles/day- once can leave bathroom, suggest slowly working back up to 1.5 miles/again.   8. Citracel- can do a higher dose- if need be- bulks up the stool- can do 3 pills/day.   9. F/U in 4 months- double appt- Cauda equina syndrome.

## 2022-11-30 NOTE — Progress Notes (Signed)
Subjective:    Patient ID: Russell [name], male    DOB: 06/19/1946, 77 y.o.   MRN: [id number]  HPI An audio/video tele-health visit is felt to be the most appropriate encounter for this patient at this time. This is a follow up tele-visit via phone. The patient is at home. MD is at office. Prior to scheduling this appointment, our staff discussed the limitations of evaluation and management by telemedicine and the availability of in-person appointments. The patient expressed understanding and agreed to proceed.  Patient is on WebEx.   Pt is a 77 yr old male with hx of lung CA- s/p L lower lobectomy and cauda equina with saddle anesthesia, neurogenic bladder; no DM; has HTN;    Here for f/u for Cauda equina syndrome  Had surgery for colon CA- found when had blood clot and severely anemic-  Had surgery 4/25 for colon CA-  Stage II no lymph nodes involved.  Monitoring is enough right now-    Has a lot of diarrhea. Using Citracel and Imodium- doesn't have formula down yet.   Keeps him close to home.  Due to cauda equina.  Hemorrhoids are bad/acting up- stools 8x/day.  Uses cream- and anusol suppository-  suppository just blows out-  Reverted back to diarrhea last night- imodium finally stopping emergency part.     Muscle pain- doesn't let him bend over a lot-  Hard when strains L hamstring.   Most nights, LLE swollen. Pain in buttock and behind L knee.  Uses TENS unit- 2-3 hours/night.  The low setting.  Usually without major pain, but last night nerves firing- along LLE/hamstring- lasted 3-4 seconds at a time- they are rare.   Still doesn't have feeling on L side of genitals.   Wife's cancer has been deemed terminal and signing up for hospice/palliative care this AM.     Pain Inventory Average Pain 1 Pain Right Now 0 My pain is intermittent, sharp, aching, and tightness  LOCATION OF PAIN  left hamstring, left leg, left buttock to left knee  BOWEL Number of stools  per week: 28 per week Oral laxative use No   Enema or suppository use No  History of colostomy No  Incontinent No   BLADDER Pads   Mobility use a cane how many minutes can you walk? 30 minutes to one hour ability to climb steps?  yes do you drive?  yes Do you have any goals in this area?  yes  Function retired Do you have any goals in this area?  yes  Neuro/Psych bowel control problems numbness  Prior Studies Any changes since last visit?  yes CT/MRI [facility] at [facility] involved in your care Any changes since last visit?  no   Family History  Problem Relation Age of Onset   Aortic aneurysm Mother    Varicose Veins Mother    Heart attack Father    Arthritis Father    Aortic aneurysm Sister    Early death Sister    CVA Sister    Aortic aneurysm Sister    Aortic aneurysm Sister    Colon cancer Neg Hx    Pancreatic cancer Neg Hx    Rectal cancer Neg Hx    Stomach cancer Neg Hx    Esophageal cancer Neg Hx    Colon polyps Neg Hx    Social History   Socioeconomic History   Marital status: Married    Spouse name: Not on file   Number of  children: 0   Years of education: Not on file   Highest education level: Master's degree (e.g., MA, MS, MEng, MEd, MSW, MBA)  Occupational History   Occupation: retired  Tobacco Use   Smoking status: Former    Packs/day: 0.25    Years: 9.00    Additional pack years: 0.00    Total pack years: 2.25    Types: Cigarettes    Start date: 10/15/1965    Quit date: 10/16/1974    Years since quitting: 48.1    Passive exposure: Past   Smokeless tobacco: Never  Vaping Use   Vaping Use: Never used  Substance and Sexual Activity   Alcohol use: Not Currently   Drug use: No   Sexual activity: Not Currently  Other Topics Concern   Not on file  Social History Narrative   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   Social Determinants of Health   Financial Resource Strain: Low Risk  (11/12/2022)   Overall Financial Resource Strain (CARDIA)    Difficulty of Paying Living Expenses: Not hard at all  Food Insecurity: No Food Insecurity (11/08/2022)   Hunger Vital Sign    Worried About Running Out of Food in the Last Year: Never true    Ran Out of Food in the Last Year: Never true  Transportation Needs: No Transportation Needs (11/12/2022)   PRAPARE - Administrator, Civil Service (Medical): No    Lack of Transportation (Non-Medical): No  Physical Activity: Sufficiently Active (10/08/2022)   Exercise Vital Sign    Days of Exercise per Week: 6 days    Minutes of Exercise per Session: 30 min  Stress: No Stress Concern Present (10/08/2022)   [employer] of Occupational Health - Occupational Stress Questionnaire    Feeling of Stress : Not at all  Social Connections: Unknown (10/08/2022)   Social Connection and Isolation Panel [NHANES]    Frequency of Communication with Friends and Family: Once a week    Frequency of Social Gatherings with Friends and Family: Patient declined    Attends Religious Services: Patient declined    Database administrator or Organizations: No    Attends Banker Meetings: Never    Marital Status: Married   Past Surgical History:  Procedure Laterality Date   BIOPSY  09/28/2022   Procedure: BIOPSY;  Surgeon: Sherrilyn Rist, MD;  Location: [facility];  Service: Gastroenterology;;   BRONCHIAL BIOPSY  05/16/2021   Procedure: BRONCHIAL BIOPSIES;  Surgeon: Josephine Igo, DO;  [facility]: [name];  Service: Pulmonary;;   BRONCHIAL BRUSHINGS  05/16/2021   Procedure: BRONCHIAL BRUSHINGS;  Surgeon: Josephine Igo, DO;  [facility]: [name];  Service: Pulmonary;;   BRONCHIAL NEEDLE ASPIRATION BIOPSY  05/16/2021   Procedure: BRONCHIAL NEEDLE ASPIRATION BIOPSIES;  Surgeon:  Josephine Igo, DO;  [facility]: [name];  Service: Pulmonary;;   CERVICAL SPINE SURGERY  04/10/2020   C3-C4 with pin at [facility]   COLONOSCOPY     COLONOSCOPY WITH PROPOFOL N/A 09/28/2022   Procedure: COLONOSCOPY WITH PROPOFOL;  Surgeon: Sherrilyn Rist, MD;  Location: [facility];  Service: Gastroenterology;  Laterality: N/A;   ESOPHAGOGASTRODUODENOSCOPY (EGD) WITH PROPOFOL N/A 09/28/2022   Procedure: ESOPHAGOGASTRODUODENOSCOPY (EGD) WITH PROPOFOL;  Surgeon: Sherrilyn Rist, MD;  Location: [facility];  Service: Gastroenterology;  Laterality: N/A;   FIDUCIAL MARKER PLACEMENT  05/16/2021   Procedure: FIDUCIAL MARKER PLACEMENT;  Surgeon: Josephine Igo, DO;  [name];  Service: Pulmonary;;   HEMORRHOID BANDING     LUMBAR LAMINECTOMY  03/08/2010   L2-3, cauda equina syndrome   lumbar laminectomy  05/12/2020   [facility]   SPINE SURGERY     UPPER GI ENDOSCOPY     VIDEO BRONCHOSCOPY WITH ENDOBRONCHIAL NAVIGATION Left 05/16/2021   Procedure: VIDEO BRONCHOSCOPY WITH ENDOBRONCHIAL NAVIGATION;  Surgeon: Josephine Igo, DO;  [name];  Service: Pulmonary;  Laterality: Left;  ION w/ fiducial placement   VIDEO BRONCHOSCOPY WITH RADIAL ENDOBRONCHIAL ULTRASOUND  05/16/2021   Procedure: VIDEO BRONCHOSCOPY WITH RADIAL ENDOBRONCHIAL ULTRASOUND;  Surgeon: Josephine Igo, DO;  [name];  Service: Pulmonary;;   wisdom teeth ext     Past Medical History:  Diagnosis Date   Anemia 2019   Arachnoiditis    Arthritis    Bleeding internal hemorrhoids 09/10/2006   Qualifier: Diagnosis of  By: Erby Pian MD, Cornelius     Cancer [facility]) 2022   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HCC) 2011   Cerebellar infarct (HCC)    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   anterior cer disc and fusion with plates Y8-6 and C4-5 at [facility]   Constipation    Encounter for general adult medical examination with abnormal findings 06/27/2022   Enlarged prostate    GERD (gastroesophageal reflux  disease)    Hemorrhoids    History of kidney stones    passed stones   History of lumbar laminectomy 05/12/2020   open L2-L5 at [facility]   Hx of colonic polyp 05/29/2018   05/2018 cecal ssp   Lung nodule 02/2021   Maxillary sinus polyp    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   Pneumonia    Prolapsed internal hemorrhoids, grade 3 09/10/2006   Qualifier: Diagnosis of  By: Erby Pian MD, Cornelius     Salivary gland enlargement    Sleep apnea 10 years ago   declined CPAP   Stroke (HCC) 15-20 years ago   Ht 6' (1.829 m)   Wt 175 lb (79.4 kg) Comment: per patient todays visit is a video visit  BMI 23.73 kg/m   Opioid Risk Score:   Fall Risk Score:  `1  Depression screen PHQ 2/9     11/30/2022    9:30 AM 09/26/2022    2:49 PM 08/31/2022    9:10 AM 07/19/2022    1:24 PM 06/27/2022    8:10 AM 01/31/2022    2:56 PM 01/31/2022    2:37 PM  Depression screen PHQ 2/9  Decreased Interest 0 0 2 1 1  0 0  Down, Depressed, Hopeless 0 0 1 0 0 0 0  PHQ - 2 Score 0 0 3 1 1  0 0  Altered sleeping   0      Tired, decreased energy   3      Change in appetite   0      Feeling bad or failure about yourself    0      Trouble concentrating   0      Moving slowly or fidgety/restless   0      Suicidal thoughts   0      PHQ-9 Score   6        Review of Systems  Gastrointestinal:  Positive for  diarrhea.  Musculoskeletal:        Left leg pain  Neurological:  Positive for numbness.  All other systems reviewed and are negative.     Objective:   Physical Exam   webex     Assessment & Plan:   Pt is a 77 yr old male with hx of lung CA- s/p L lower lobectomy and cauda equina with saddle anesthesia, neurogenic bladder; no DM; has HTN; New stage II Colon CA- s/p resection 11/09/22 Wife also terminal cancer as well.     Here for f/u for Cauda equina syndrome  Take probiotic and prebiotic for diarrhea- continue to do so.   2. Titrate imodium- don't be automatic in taking-can take 8 pills/day.   3. Con't  Gabapentin 300 mg TID- goes several weeks between nerve pain episodes- so con't current dose. Doesn't need refills today.   4. Con't Anusol suppository and external cream for hemorrhoids- and epsom salt baths.  Getting bathroom remodeled   5.  Con't Rapaflo for bladder- per Urology.   6.  Doesn't need Peristeen irrigation at this time- since has 1/2 anus sensation/motor movement.   7. Living with cauda equina syndrome-   Before surgery walking 1.5 miles/day- once can leave bathroom, suggest slowly working back up to 1.5 miles/again.   8. Citracel- can do a higher dose- if need be- bulks up the stool- can do 3 pills/day.   9. F/U in 4 months- double appt- Cauda equina syndrome.    I spent a total of  42  minutes on total care today- >50% coordination of care- due to  Prolonged time taking about new colon CA dx as well as wife's terminal ca dx. Helping with grief and diarrhea issues and nerve pain

## 2022-12-28 ENCOUNTER — Ambulatory Visit: Payer: Medicare PPO | Admitting: Internal Medicine

## 2022-12-31 ENCOUNTER — Encounter: Payer: Self-pay | Admitting: Family Medicine

## 2022-12-31 ENCOUNTER — Ambulatory Visit: Payer: Medicare PPO | Admitting: Family Medicine

## 2022-12-31 VITALS — BP 117/70 | HR 88 | Ht 72.0 in | Wt 187.0 lb

## 2022-12-31 DIAGNOSIS — A6920 Lyme disease, unspecified: Secondary | ICD-10-CM | POA: Diagnosis not present

## 2022-12-31 MED ORDER — DOXYCYCLINE HYCLATE 100 MG PO TABS
100.0000 mg | ORAL_TABLET | Freq: Two times a day (BID) | ORAL | 0 refills | Status: AC
Start: 2022-12-31 — End: 2023-01-10

## 2022-12-31 NOTE — Assessment & Plan Note (Addendum)
Will treat today with doxycycline 100 mg twice daily for 10 days Educated on the signs and symptoms of Lyme disease All questions answered and patient verbalized understanding Will follow up with PCP in 2 weeks

## 2022-12-31 NOTE — Patient Instructions (Addendum)
I appreciate the opportunity to provide care to you today!    Follow up: 2 weeks with PCP  Lyme disease is the most common tick-borne illness in the [place] and [place]. Lyme disease is caused by an infection with the bacteria, Borrelia burgdorferi, which is carried by deer ticks. The bacteria are transmitted when a tick bites a pers  Treatment with antibiotics is very effective in eliminating symptoms, preventing progression to later manifestations of the disease, and curing the infection  Symptoms of Lyme disease can vary widely and may include a rash at the site of the tick bite, flu-like symptoms, arthritis, heart symptoms, and neurologic symptoms. Infection causes few or no symptoms in a small percentage of people.  Symptoms are caused by the body's immune response to the bacteria and the inflammation that results; inflammation may continue even after treatment. Symptoms vary with the phase of the disease. The three phases are early localized, early disseminated, and late Lyme disease.  Early localized -- Early localized Lyme disease causes a skin condition called erythema migrans (EM). EM typically occurs within one month of the tick bite, usually about 7 to 14 days after the tick bite, which may not have been noticed.  Erythema migrans -- Erythema migrans (EM) is a distinctive skin rash that occurs at the site of the tick bite. The rash is usually salmon to red-colored; the color may cover the entire lesion  Please continue to a heart-healthy diet and increase your physical activities. Try to exercise for at least five days a week.      It was a pleasure to see you and I look forward to continuing to work together on your health and well-being. Please do not hesitate to call the office if you need care or have questions about your care.   Have a wonderful day and week. With Gratitude, Gilmore Laroche MSN, FNP-BC

## 2022-12-31 NOTE — Progress Notes (Signed)
Acute Office Visit  Subjective:    Patient ID: Russell [name], male    DOB: 01/15/1946, 77 y.o.   MRN: [id number]  Chief Complaint  Patient presents with   shoulder mass    Pt reports shoulder mass on skin on his left shoulder, tender to the touch, noticed It on 12/29/2022.     HPI Patient is in today with reports of a mass on his left shoulder.  The patient was bitten by a tick a month ago on his left shoulder and noticed a rash on 12/29/2022.  Denies burning and itching with the rash.  The rash has increased in size and is erythematous with mild tenderness to palpation.  No fever, fatigue, anorexia, headache, neck stiffness, myalgia, and arthralgia reported.  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   anterior cer disc and fusion with plates O9-6 and C4-5 at [facility]   Constipation    Encounter for general adult medical examination with abnormal findings 06/27/2022   Enlarged prostate    GERD (gastroesophageal reflux disease)    Hemorrhoids    History of kidney stones    passed stones   History of lumbar laminectomy 05/12/2020   open L2-L5 at [facility]   Hx of colonic polyp 05/29/2018   05/2018 cecal ssp   Lung nodule 02/2021   Maxillary sinus polyp    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   Pneumonia    Prolapsed internal hemorrhoids, grade 3 09/10/2006   Qualifier: Diagnosis of  By: Erby Pian MD, Cornelius     Salivary gland enlargement    Sleep apnea 10 years ago   declined CPAP   Stroke (HCC) 15-20 years ago    Past Surgical History:  Procedure Laterality Date   BIOPSY  09/28/2022   Procedure: BIOPSY;  Surgeon: Sherrilyn Rist, MD;  Location: [facility];  Service: Gastroenterology;;   BRONCHIAL BIOPSY   05/16/2021   Procedure: BRONCHIAL BIOPSIES;  Surgeon: Josephine Igo, DO;  [facility]: [name];  Service: Pulmonary;;   BRONCHIAL BRUSHINGS  05/16/2021   Procedure: BRONCHIAL BRUSHINGS;  Surgeon: Josephine Igo, DO;  Location: [name];  Service: Pulmonary;;   BRONCHIAL NEEDLE ASPIRATION BIOPSY  05/16/2021   Procedure: BRONCHIAL NEEDLE ASPIRATION BIOPSIES;  Surgeon: Josephine Igo, DO;  Location: [name];  Service: Pulmonary;;   CERVICAL SPINE SURGERY  04/10/2020   C3-C4 with pin at [facility]   COLONOSCOPY     COLONOSCOPY WITH PROPOFOL N/A 09/28/2022   Procedure: COLONOSCOPY WITH PROPOFOL;  Surgeon: Sherrilyn Rist, MD;  Location: [facility];  Service: Gastroenterology;  Laterality: N/A;   ESOPHAGOGASTRODUODENOSCOPY (EGD) WITH PROPOFOL N/A 09/28/2022   Procedure: ESOPHAGOGASTRODUODENOSCOPY (EGD) WITH PROPOFOL;  Surgeon: Sherrilyn Rist, MD;  Location: [facility];  Service: Gastroenterology;  Laterality: N/A;   FIDUCIAL MARKER PLACEMENT  05/16/2021   Procedure: FIDUCIAL MARKER PLACEMENT;  Surgeon: Josephine Igo, DO;  [facility]: [name];  Service: Pulmonary;;   HEMORRHOID BANDING     LUMBAR LAMINECTOMY  03/08/2010   L2-3, cauda equina syndrome   lumbar laminectomy  05/12/2020   [facility]   SPINE SURGERY     UPPER GI ENDOSCOPY  VIDEO BRONCHOSCOPY WITH ENDOBRONCHIAL NAVIGATION Left 05/16/2021   Procedure: VIDEO BRONCHOSCOPY WITH ENDOBRONCHIAL NAVIGATION;  Surgeon: Josephine Igo, DO;  [facility]: [name];  Service: Pulmonary;  Laterality: Left;  ION w/ fiducial placement   VIDEO BRONCHOSCOPY WITH RADIAL ENDOBRONCHIAL ULTRASOUND  05/16/2021   Procedure: VIDEO BRONCHOSCOPY WITH RADIAL ENDOBRONCHIAL ULTRASOUND;  Surgeon: Josephine Igo, DO;  Location: [name];  Service: Pulmonary;;   wisdom teeth ext      Family History  Problem Relation Age of Onset   Aortic aneurysm Mother    Varicose Veins Mother    Heart attack Father    Arthritis Father    Aortic  aneurysm Sister    Early death Sister    CVA Sister    Aortic aneurysm Sister    Aortic aneurysm Sister    Colon cancer Neg Hx    Pancreatic cancer Neg Hx    Rectal cancer Neg Hx    Stomach cancer Neg Hx    Esophageal cancer Neg Hx    Colon polyps Neg Hx     Social History   Socioeconomic History   Marital status: Married    Spouse name: Not on file   Number of children: 0   Years of education: Not on file   Highest education level: Master's degree (e.g., MA, MS, MEng, MEd, MSW, MBA)  Occupational History   Occupation: retired  Tobacco Use   Smoking status: Former    Packs/day: 0.25    Years: 9.00    Additional pack years: 0.00    Total pack years: 2.25    Types: Cigarettes    Start date: 10/15/1965    Quit date: 10/16/1974    Years since quitting: 48.2    Passive exposure: Past   Smokeless tobacco: Never  Vaping Use   Vaping Use: Never used  Substance and Sexual Activity   Alcohol use: Not Currently   Drug use: No   Sexual activity: Not Currently  Other Topics Concern   Not on file  Social History Narrative   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   Social Determinants of Health   Financial Resource Strain: Low Risk  (11/12/2022)   Overall Financial Resource Strain (CARDIA)    Difficulty of Paying Living Expenses: Not hard at all  Food Insecurity: No Food Insecurity (11/08/2022)   Hunger Vital Sign    Worried About Running Out of Food in the Last Year: Never true    Ran Out of Food in the Last Year: Never true  Transportation Needs: No Transportation Needs (11/12/2022)   PRAPARE - Administrator, Civil Service (Medical): No    Lack of Transportation (Non-Medical): No  Physical Activity: Sufficiently Active (10/08/2022)   Exercise Vital Sign    Days of Exercise per Week: 6  days    Minutes of Exercise per Session: 30 min  Stress: No Stress Concern Present (10/08/2022)   [employer] of Occupational Health - Occupational Stress Questionnaire    Feeling of Stress : Not at all  Social Connections: Unknown (10/08/2022)   Social Connection and Isolation Panel [NHANES]    Frequency of Communication with Friends and Family: Once a week    Frequency of Social Gatherings with Friends and Family: Patient declined  Attends Religious Services: Patient declined    Active Member of Clubs or Organizations: No    Attends Banker Meetings: Never    Marital Status: Married  Catering manager Violence: Not At Risk (11/08/2022)   Humiliation, Afraid, Rape, and Kick questionnaire    Fear of Current or Ex-Partner: No    Emotionally Abused: No    Physically Abused: No    Sexually Abused: No    Outpatient Medications Prior to Visit  Medication Sig Dispense Refill   Acetaminophen 500 MG capsule      ASHWAGANDHA PO Take 150 mg by mouth in the morning and at bedtime.     aspirin EC 81 MG tablet Take 1 tablet (81 mg total) by mouth every evening. 30 tablet 12   diclofenac Sodium (VOLTAREN) 1 % GEL Apply 2 g topically daily as needed (pain).     finasteride (PROSCAR) 5 MG tablet Take 1 tablet (5 mg total) by mouth daily. (Patient taking differently: Take 5 mg by mouth at bedtime.) 90 tablet 3   gabapentin (NEURONTIN) 300 MG capsule Take 1 capsule (300 mg total) by mouth 3 (three) times daily. (Actually 300 mg in AM and 600 mg QHS) x 1 week, then 600 mg BID- for nerve pain (Patient taking differently: Take 600 mg by mouth 2 (two) times daily.) 360 capsule 1   iron polysaccharides (NIFEREX) 150 MG capsule Take 1 capsule (150 mg total) by mouth daily. (Patient taking differently: Take 150 mg by mouth every evening.) 30 capsule 1   Magnesium 400 MG CAPS Take 400 mg by mouth 2 (two) times daily. asporotate     melatonin 5 MG TABS Take 5 mg by mouth at bedtime.      metoprolol succinate (TOPROL XL) 25 MG 24 hr tablet Take 1 tablet (25 mg total) by mouth daily. 90 tablet 3   metroNIDAZOLE (METROGEL) 0.75 % gel Apply to face daily for rosacea 45 g 4   neomycin (MYCIFRADIN) 500 MG tablet Take 1,000 mg by mouth 3 (three) times daily.     Omega-3 Fatty Acids (FISH OIL PO) Take 460 mg by mouth 2 (two) times daily.     omeprazole (PRILOSEC) 40 MG capsule TAKE (1) CAPSULE BY MOUTH ONCE DAILY. 90 capsule 3   oxyCODONE (ROXICODONE) 5 MG immediate release tablet Take 1 tablet (5 mg total) by mouth every 4 (four) hours as needed for severe pain. 30 tablet 0   Probiotic Product (PROBIOTIC DAILY PO) Take 1 tablet by mouth daily.     silodosin (RAPAFLO) 8 MG CAPS capsule Take 1 capsule (8 mg total) by mouth 2 (two) times daily. 30 capsule 11   TURMERIC PO Take 500 mg by mouth 2 (two) times daily. curamed curcumin     No facility-administered medications prior to visit.    No Known Allergies  Review of Systems  Constitutional:  Negative for fatigue and fever.  Eyes:  Negative for visual disturbance.  Respiratory:  Negative for chest tightness and shortness of breath.   Cardiovascular:  Negative for chest pain and palpitations.  Skin:  Positive for rash.  Neurological:  Negative for dizziness and headaches.       Objective:    Physical Exam HENT:     Head: Normocephalic.     Right Ear: External ear normal.     Left Ear: External ear normal.     Nose: No congestion or rhinorrhea.     Mouth/Throat:     Mouth: Mucous membranes are  moist.  Cardiovascular:     Rate and Rhythm: Regular rhythm.     Heart sounds: No murmur heard. Pulmonary:     Effort: No respiratory distress.     Breath sounds: Normal breath sounds.  Skin:    Findings: Rash (erythema migrans rash noted on the left shoulder) present.  Neurological:     Mental Status: He is alert.     BP 117/70   Pulse 88   Ht 6' (1.829 m)   Wt 187 lb (84.8 kg)   SpO2 96%   BMI 25.36 kg/m  Wt  Readings from Last 3 Encounters:  12/31/22 187 lb (84.8 kg)  11/30/22 175 lb (79.4 kg)  11/10/22 182 lb 6.4 oz (82.7 kg)       Assessment & Plan:  Erythema migrans (Lyme disease) Assessment & Plan: Will treat today with doxycycline 100 mg twice daily for 10 days Educated on the signs and symptoms of Lyme disease All questions answered and patient verbalized understanding Will follow up with PCP in 2 weeks  Orders: -     Doxycycline Hyclate; Take 1 tablet (100 mg total) by mouth 2 (two) times daily for 10 days.  Dispense: 20 tablet; Refill: 0    Gilmore Laroche, FNP

## 2023-01-14 ENCOUNTER — Encounter: Payer: Self-pay | Admitting: Internal Medicine

## 2023-01-14 ENCOUNTER — Ambulatory Visit: Payer: Medicare PPO | Admitting: Internal Medicine

## 2023-01-14 VITALS — BP 122/76 | HR 73 | Ht 72.0 in | Wt 186.4 lb

## 2023-01-14 DIAGNOSIS — Z9049 Acquired absence of other specified parts of digestive tract: Secondary | ICD-10-CM | POA: Diagnosis not present

## 2023-01-14 DIAGNOSIS — Z1159 Encounter for screening for other viral diseases: Secondary | ICD-10-CM

## 2023-01-14 DIAGNOSIS — D500 Iron deficiency anemia secondary to blood loss (chronic): Secondary | ICD-10-CM

## 2023-01-14 DIAGNOSIS — G039 Meningitis, unspecified: Secondary | ICD-10-CM | POA: Diagnosis not present

## 2023-01-14 DIAGNOSIS — N138 Other obstructive and reflux uropathy: Secondary | ICD-10-CM

## 2023-01-14 DIAGNOSIS — Z85118 Personal history of other malignant neoplasm of bronchus and lung: Secondary | ICD-10-CM

## 2023-01-14 DIAGNOSIS — N401 Enlarged prostate with lower urinary tract symptoms: Secondary | ICD-10-CM | POA: Diagnosis not present

## 2023-01-14 NOTE — Assessment & Plan Note (Signed)
Takes Rapaflo and finasteride °Followed by Dr. McKenzie °

## 2023-01-14 NOTE — Progress Notes (Signed)
Established Patient Office Visit  Subjective:  Patient ID: Russell [name], male    DOB: 02/24/1946  Age: 77 y.o. MRN: [id number]  CC:  Chief Complaint  Patient presents with   Lyme Disease    Follow up for lyme disease.    HPI Russell [name] is a 77 y.o. male with past medical history of DDD of cervical and lumbar spine, cauda equina syndrome, GERD, lung cancer and BPH who presents for f/u of his chronic medical conditions.  He has history of DDD of cervical and lumbar spine.  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  He has seen Dr. Katrinka Blazing at [facility].  He has worsening of his symptoms with bending, especially while tying his shoelaces.  His symptoms are worse in the evening.  He has been treated with steroids, gabapentin and muscle relaxers in the past. He follows up with PM&R now and takes gabapentin 600 mg BID.  He had right hemicolectomy for colon cancer.  He had C. Diff colitis in 05/24, which was treated with oral vancomycin.  He reports resolution of diarrhea now.  Denies any fever or chills.  His Hb had dropped to 6.8 and last CBC in 04/24 showed Hb of 7.9.  He denies any melena or hematochezia currently.  He sees Dr. Francine Graven for history of left lower lobe lung CA and has had resection of left lower lobe with Dr. Mikel Cella at [facility].  He denies any chest pain, dyspnea or wheezing currently.  He recently had erythema migrans after bite and was given doxycycline for possible Lyme's disease.  His rash has resolved now.  Denies any fever, chills, or other recent worsening of fatigue.     Past Medical History:  Diagnosis Date   Acute blood loss anemia 09/26/2022   Anemia 2019   Arachnoiditis    Arthritis    Bleeding internal hemorrhoids 09/10/2006   Qualifier: Diagnosis of  By: Erby Pian MD, Cornelius     Cancer [facility] 2022   Cauda equina syndrome [facility]) 2011   Cerebellar infarct  [facility])    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   anterior cer disc and fusion with plates W0-9 and C4-5 at [facility]   Colonic mass 09/29/2022   Constipation    Encounter for general adult medical examination with abnormal findings 06/27/2022   Enlarged prostate    GERD (gastroesophageal reflux disease)    Hemorrhoids    History of kidney stones    passed stones   History of lumbar laminectomy 05/12/2020   open L2-L5 at [facility]   Hx of colonic polyp 05/29/2018   05/2018 cecal ssp   Lung nodule 02/2021   Maxillary sinus polyp    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   Pneumonia    Prolapsed internal hemorrhoids, grade 3 09/10/2006   Qualifier: Diagnosis of  By: Erby Pian MD, Cornelius     Salivary gland enlargement    Sleep apnea 10 years ago   declined CPAP   Stroke (HCC) 15-20 years ago    Past Surgical History:  Procedure Laterality Date   BIOPSY  09/28/2022   Procedure: BIOPSY;  Surgeon: Sherrilyn Rist, MD;  Location: [facility];  Service: Gastroenterology;;   BRONCHIAL BIOPSY  05/16/2021   Procedure: BRONCHIAL BIOPSIES;  Surgeon: Josephine Igo, DO;  Location: [name];  Service: Pulmonary;;   BRONCHIAL BRUSHINGS  05/16/2021   Procedure: BRONCHIAL BRUSHINGS;  Surgeon: Josephine Igo, DO;  Location: [name];  Service: Pulmonary;;   BRONCHIAL NEEDLE ASPIRATION BIOPSY  05/16/2021   Procedure: BRONCHIAL NEEDLE ASPIRATION BIOPSIES;  Surgeon: Josephine Igo, DO;  Location: [name];  Service: Pulmonary;;   CERVICAL SPINE SURGERY  04/10/2020   C3-C4 with pin at [facility]   COLONOSCOPY     COLONOSCOPY WITH PROPOFOL N/A 09/28/2022   Procedure: COLONOSCOPY WITH PROPOFOL;  Surgeon: Sherrilyn Rist, MD;  Location: [facility];  Service: Gastroenterology;  Laterality: N/A;   ESOPHAGOGASTRODUODENOSCOPY (EGD) WITH PROPOFOL N/A 09/28/2022   Procedure: ESOPHAGOGASTRODUODENOSCOPY (EGD) WITH PROPOFOL;  Surgeon: Sherrilyn Rist, MD;  Location: [facility];  Service:  Gastroenterology;  Laterality: N/A;   FIDUCIAL MARKER PLACEMENT  05/16/2021   Procedure: FIDUCIAL MARKER PLACEMENT;  Surgeon: Josephine Igo, DO;  Location: [name];  Service: Pulmonary;;   HEMORRHOID BANDING     LUMBAR LAMINECTOMY  03/08/2010   L2-3, cauda equina syndrome   lumbar laminectomy  05/12/2020   [facility]   SPINE SURGERY     UPPER GI ENDOSCOPY     VIDEO BRONCHOSCOPY WITH ENDOBRONCHIAL NAVIGATION Left 05/16/2021   Procedure: VIDEO BRONCHOSCOPY WITH ENDOBRONCHIAL NAVIGATION;  Surgeon: Josephine Igo, DO;  Location: [name];  Service: Pulmonary;  Laterality: Left;  ION w/ fiducial placement   VIDEO BRONCHOSCOPY WITH RADIAL ENDOBRONCHIAL ULTRASOUND  05/16/2021   Procedure: VIDEO BRONCHOSCOPY WITH RADIAL ENDOBRONCHIAL ULTRASOUND;  Surgeon: Josephine Igo, DO;  Location: [name];  Service: Pulmonary;;   wisdom teeth ext      Family History  Problem Relation Age of Onset   Aortic aneurysm Mother    Varicose Veins Mother    Heart attack Father    Arthritis Father    Aortic aneurysm Sister    Early death Sister    CVA Sister    Aortic aneurysm Sister    Aortic aneurysm Sister    Colon cancer Neg Hx    Pancreatic cancer Neg Hx    Rectal cancer Neg Hx    Stomach cancer Neg Hx    Esophageal cancer Neg Hx    Colon polyps Neg Hx     Social History   Socioeconomic History   Marital status: Married    Spouse name: Not on file   Number of children: 0   Years of education: Not on file   Highest education level: Master's degree (e.g., MA, MS, MEng, MEd, MSW, MBA)  Occupational History   Occupation: retired  Tobacco Use   Smoking status: Former    Packs/day: 0.25    Years: 9.00    Additional pack years: 0.00    Total pack years: 2.25    Types: Cigarettes    Start date: 10/15/1965    Quit date: 10/16/1974    Years since quitting: 48.2    Passive exposure: Past   Smokeless tobacco: Never  Vaping Use   Vaping Use: Never used  Substance and Sexual Activity    Alcohol use: Not Currently   Drug use: No   Sexual activity: Not Currently  Other Topics Concern   Not on file  Social History Narrative   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   Social Determinants of Health   Financial Resource Strain:  Low Risk  (11/12/2022)   Overall Financial Resource Strain (CARDIA)    Difficulty of Paying Living Expenses: Not hard at all  Food Insecurity: No Food Insecurity (11/08/2022)   Hunger Vital Sign    Worried About Running Out of Food in the Last Year: Never true    Ran Out of Food in the Last Year: Never true  Transportation Needs: No Transportation Needs (11/12/2022)   PRAPARE - Administrator, Civil Service (Medical): No    Lack of Transportation (Non-Medical): No  Physical Activity: Sufficiently Active (10/08/2022)   Exercise Vital Sign    Days of Exercise per Week: 6 days    Minutes of Exercise per Session: 30 min  Stress: No Stress Concern Present (10/08/2022)   [employer] of Occupational Health - Occupational Stress Questionnaire    Feeling of Stress : Not at all  Social Connections: Unknown (10/08/2022)   Social Connection and Isolation Panel [NHANES]    Frequency of Communication with Friends and Family: Once a week    Frequency of Social Gatherings with Friends and Family: Patient declined    Attends Religious Services: Patient declined    Database administrator or Organizations: No    Attends Banker Meetings: Never    Marital Status: Married  Catering manager Violence: Not At Risk (11/08/2022)   Humiliation, Afraid, Rape, and Kick questionnaire    Fear of Current or Ex-Partner: No    Emotionally Abused: No    Physically Abused: No    Sexually Abused: No    Outpatient Medications Prior to Visit  Medication Sig Dispense  Refill   Acetaminophen 500 MG capsule      ASHWAGANDHA PO Take 150 mg by mouth in the morning and at bedtime.     aspirin EC 81 MG tablet Take 1 tablet (81 mg total) by mouth every evening. 30 tablet 12   diclofenac Sodium (VOLTAREN) 1 % GEL Apply 2 g topically daily as needed (pain).     finasteride (PROSCAR) 5 MG tablet Take 1 tablet (5 mg total) by mouth daily. (Patient taking differently: Take 5 mg by mouth at bedtime.) 90 tablet 3   gabapentin (NEURONTIN) 300 MG capsule Take 1 capsule (300 mg total) by mouth 3 (three) times daily. (Actually 300 mg in AM and 600 mg QHS) x 1 week, then 600 mg BID- for nerve pain (Patient taking differently: Take 600 mg by mouth 2 (two) times daily.) 360 capsule 1   iron polysaccharides (NIFEREX) 150 MG capsule Take 1 capsule (150 mg total) by mouth daily. (Patient taking differently: Take 150 mg by mouth every evening.) 30 capsule 1   Magnesium 400 MG CAPS Take 400 mg by mouth 2 (two) times daily. asporotate     melatonin 5 MG TABS Take 5 mg by mouth at bedtime.     metoprolol succinate (TOPROL XL) 25 MG 24 hr tablet Take 1 tablet (25 mg total) by mouth daily. 90 tablet 3   metroNIDAZOLE (METROGEL) 0.75 % gel Apply to face daily for rosacea 45 g 4   Omega-3 Fatty Acids (FISH OIL PO) Take 460 mg by mouth 2 (two) times daily.     omeprazole (PRILOSEC) 40 MG capsule TAKE (1) CAPSULE BY MOUTH ONCE DAILY. 90 capsule 3   Probiotic Product (PROBIOTIC DAILY PO) Take 1 tablet by mouth daily.     silodosin (RAPAFLO) 8 MG CAPS capsule Take 1 capsule (8 mg total) by mouth 2 (two) times daily. 30 capsule  11   TURMERIC PO Take 500 mg by mouth 2 (two) times daily. curamed curcumin     neomycin (MYCIFRADIN) 500 MG tablet Take 1,000 mg by mouth 3 (three) times daily.     oxyCODONE (ROXICODONE) 5 MG immediate release tablet Take 1 tablet (5 mg total) by mouth every 4 (four) hours as needed for severe pain. 30 tablet 0   No facility-administered medications prior to visit.     No Known Allergies  ROS Review of Systems  Constitutional:  Negative for chills and fever.  HENT:  Negative for congestion and sore throat.   Eyes:  Negative for pain and discharge.  Respiratory:  Negative for cough and shortness of breath.   Cardiovascular:  Negative for chest pain and palpitations.  Gastrointestinal:  Negative for diarrhea, nausea and vomiting.  Endocrine: Negative for polydipsia and polyuria.  Genitourinary:  Negative for dysuria and hematuria.  Musculoskeletal:  Positive for back pain and neck pain. Negative for neck stiffness.  Skin:  Negative for rash.  Neurological:  Positive for weakness and numbness. Negative for headaches.  Psychiatric/Behavioral:  Negative for agitation and behavioral problems.       Objective:    Physical Exam Vitals reviewed.  Constitutional:      General: He is not in acute distress.    Appearance: He is not diaphoretic.  HENT:     Head: Normocephalic and atraumatic.     Nose: Nose normal.     Mouth/Throat:     Mouth: Mucous membranes are moist.  Eyes:     General: No scleral icterus.    Extraocular Movements: Extraocular movements intact.  Cardiovascular:     Rate and Rhythm: Normal rate and regular rhythm.     Pulses: Normal pulses.     Heart sounds: Normal heart sounds. No murmur heard. Pulmonary:     Breath sounds: Normal breath sounds. No wheezing or rales.  Abdominal:     Palpations: Abdomen is soft.     Tenderness: There is no abdominal tenderness.     Comments: Incision site - C/D/I  Musculoskeletal:     Cervical back: Neck supple. No tenderness.     Right lower leg: No edema.     Left lower leg: No edema.  Skin:    General: Skin is warm.     Findings: No rash.  Neurological:     General: No focal deficit present.     Mental Status: He is alert and oriented to person, place, and time.  Psychiatric:        Mood and Affect: Mood normal.        Behavior: Behavior normal.     BP 122/76 (BP Location:  Right Arm, Patient Position: Sitting, Cuff Size: Normal)   Pulse 73   Ht 6' (1.829 m)   Wt 186 lb 6.4 oz (84.6 kg)   SpO2 94%   BMI 25.28 kg/m  Wt Readings from Last 3 Encounters:  01/14/23 186 lb 6.4 oz (84.6 kg)  12/31/22 187 lb (84.8 kg)  11/30/22 175 lb (79.4 kg)    Lab Results  Component Value Date   TSH 1.460 06/18/2022   Lab Results  Component Value Date   WBC 10.2 11/10/2022   HGB 7.9 (L) 11/10/2022   HCT 26.1 (L) 11/10/2022   MCV 69.6 (L) 11/10/2022   PLT 252 11/10/2022   Lab Results  Component Value Date   NA 134 (L) 11/10/2022   K 3.7 11/10/2022   CO2 23 11/10/2022  GLUCOSE 104 (H) 11/10/2022   BUN 11 11/10/2022   CREATININE 0.82 11/10/2022   BILITOT 0.8 09/29/2022   ALKPHOS 56 09/29/2022   AST 16 09/29/2022   ALT 13 09/29/2022   PROT 6.3 (L) 09/29/2022   ALBUMIN 3.6 09/29/2022   CALCIUM 8.6 (L) 11/10/2022   ANIONGAP 10 11/10/2022   EGFR 96 10/08/2022   GFR 112.81 09/28/2019   Lab Results  Component Value Date   CHOL 143 06/18/2022   Lab Results  Component Value Date   HDL 66 06/18/2022   Lab Results  Component Value Date   LDLCALC 63 06/18/2022   Lab Results  Component Value Date   TRIG 68 06/18/2022   Lab Results  Component Value Date   CHOLHDL 2.2 06/18/2022   Lab Results  Component Value Date   HGBA1C 5.6 09/29/2019      Assessment & Plan:   Problem List Items Addressed This Visit       Nervous and Auditory   Arachnoiditis - Primary    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 Ronne Binning        Other   IDA (iron deficiency anemia)    Likely due to GI bleeding S/p right hemicolectomy for colon cancer On iron supplement Check CBC      Relevant Orders   CBC with Differential/Platelet   History of cancer of lower lobe bronchus or lung - s/p left  lower lobectomy at [facility] 09-07-2021    Former smoker Followed by Pulmonology and Oncology S/p lobectomy at [facility]      H/O right hemicolectomy    For history of colon cancer Currently denies melena or hematochezia      Relevant Orders   Basic Metabolic Panel (BMET)   Other Visit Diagnoses     Need for hepatitis C screening test       Relevant Orders   Hepatitis C Antibody      No orders of the defined types were placed in this encounter.   Follow-up: Return in about 6 months (around 07/17/2023) for Annual physical.    Anabel Halon, MD

## 2023-01-14 NOTE — Assessment & Plan Note (Signed)
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

## 2023-01-14 NOTE — Assessment & Plan Note (Deleted)
Former smoker Followed by Pulmonology and Oncology S/p lobectomy at [facility] 

## 2023-01-14 NOTE — Assessment & Plan Note (Signed)
Former smoker Followed by Pulmonology and Oncology S/p lobectomy at [facility] 

## 2023-01-14 NOTE — Patient Instructions (Signed)
Please continue taking iron supplement.  Please continue to take other medications as prescribed.  Please continue to follow low salt diet and ambulate as tolerated.

## 2023-01-14 NOTE — Assessment & Plan Note (Signed)
For history of colon cancer Currently denies melena or hematochezia

## 2023-01-14 NOTE — Assessment & Plan Note (Addendum)
Likely due to GI bleeding S/p right hemicolectomy for colon cancer On iron supplement Check CBC

## 2023-01-15 LAB — CBC WITH DIFFERENTIAL/PLATELET
Basophils Absolute: 0 10*3/uL (ref 0.0–0.2)
Basos: 1 %
EOS (ABSOLUTE): 0.1 10*3/uL (ref 0.0–0.4)
Eos: 2 %
Hematocrit: 32.7 % — ABNORMAL LOW (ref 37.5–51.0)
Hemoglobin: 9.6 g/dL — ABNORMAL LOW (ref 13.0–17.7)
Immature Grans (Abs): 0 10*3/uL (ref 0.0–0.1)
Immature Granulocytes: 0 %
Lymphocytes Absolute: 1.5 10*3/uL (ref 0.7–3.1)
Lymphs: 27 %
MCH: 20.4 pg — ABNORMAL LOW (ref 26.6–33.0)
MCHC: 29.4 g/dL — ABNORMAL LOW (ref 31.5–35.7)
MCV: 70 fL — ABNORMAL LOW (ref 79–97)
Monocytes Absolute: 0.5 10*3/uL (ref 0.1–0.9)
Monocytes: 9 %
Neutrophils Absolute: 3.4 10*3/uL (ref 1.4–7.0)
Neutrophils: 61 %
Platelets: 348 10*3/uL (ref 150–450)
RBC: 4.7 x10E6/uL (ref 4.14–5.80)
RDW: 18.5 % — ABNORMAL HIGH (ref 11.6–15.4)
WBC: 5.5 10*3/uL (ref 3.4–10.8)

## 2023-01-15 LAB — BASIC METABOLIC PANEL
BUN/Creatinine Ratio: 18 (ref 10–24)
BUN: 13 mg/dL (ref 8–27)
CO2: 23 mmol/L (ref 20–29)
Calcium: 9.3 mg/dL (ref 8.6–10.2)
Chloride: 103 mmol/L (ref 96–106)
Creatinine, Ser: 0.71 mg/dL — ABNORMAL LOW (ref 0.76–1.27)
Glucose: 91 mg/dL (ref 70–99)
Potassium: 4.4 mmol/L (ref 3.5–5.2)
Sodium: 138 mmol/L (ref 134–144)
eGFR: 96 mL/min/{1.73_m2} (ref >59)

## 2023-01-15 LAB — HEPATITIS C ANTIBODY: Hep C Virus Ab: NONREACTIVE

## 2023-01-31 ENCOUNTER — Ambulatory Visit: Payer: Medicare PPO | Admitting: Rheumatology

## 2023-02-28 ENCOUNTER — Other Ambulatory Visit: Payer: Self-pay

## 2023-02-28 MED ORDER — FINASTERIDE 5 MG PO TABS: 5.0000 mg | ORAL_TABLET | Freq: Every day | ORAL | 0 refills | Status: DC

## 2023-02-28 NOTE — Progress Notes (Signed)
Office Visit Note  Patient: Russell [name]             Date of Birth: September 01, 1945           MRN: [id number]             PCP: Anabel Halon, MD Referring: Anabel Halon, MD Visit Date: 03/13/2023 Occupation: @GUAROCC @  Subjective:  Fatigue and joint pain   History of Present Illness: Russell [name] is a 77 y.o. 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  Patient states that he has been having a very difficult year.  He states he was diagnosed with colon cancer in March 2024.  He underwent partial colectomy in April 2024.  He continues to have some constipation.  He has been also having very rough time as his wife is suffering from ovarian cancer and does not have a good prognosis.  He continues to have pain and stiffness in his bilateral hands and swelling in his hands.  He has been taking over-the-counter natural anti-inflammatories without much relief.  He has intermittent discomfort in his left knee joint.  He has ongoing discomfort in his feet and ankles.  He has been followed by Dr. 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  He continues to have weakness in his left lower extremity.  He has been through physical therapy.  He also planning to start some exercises at home.    Activities of Daily Living:  Patient reports morning stiffness for 30 minutes.   Patient Denies nocturnal pain.  Difficulty dressing/grooming: Denies Difficulty climbing stairs: Reports Difficulty getting out of chair: Reports Difficulty using hands for taps, buttons, cutlery, and/or writing: Reports  Review of Systems  Constitutional:  Positive for fatigue.  HENT:  Negative for mouth sores and mouth dryness.   Eyes:  Negative for dryness.  Respiratory:  Negative for shortness of breath.   Cardiovascular:  Negative for chest pain and palpitations.  Gastrointestinal:  Negative for blood in stool, constipation and diarrhea.  Endocrine:  Negative for increased urination.  Genitourinary:  Negative for involuntary urination.  Musculoskeletal:  Positive for joint pain, gait problem, joint pain, joint swelling, myalgias, muscle weakness, morning stiffness, muscle tenderness and myalgias.  Skin:  Positive for hair loss and sensitivity to sunlight. Negative for color change and rash.  Allergic/Immunologic: Negative for susceptible to infections.  Neurological:  Negative for dizziness and headaches.  Hematological:  Negative for swollen glands.  Psychiatric/Behavioral:  Positive for sleep disturbance. Negative for depressed mood. The patient is nervous/anxious.     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   Encounter for general adult medical examination with abnormal findings 06/27/2022   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) 05/23/2021   Former smoker 05/05/2021   Elevated PSA 02/16/2021   Erectile dysfunction due to arterial insufficiency 05/11/2020   Benign prostatic hyperplasia with urinary obstruction 04/11/2020   DDD (degenerative disc disease), cervical 06/11/2018   DDD (degenerative  disc disease), lumbar 06/11/2018   History of kidney stones 06/11/2018   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HCC) 06/11/2018   Primary osteoarthritis of left knee 06/11/2018   Primary osteoarthritis of both feet 06/11/2018   Plantar fasciitis 06/11/2018   Hx of colonic polyp 05/29/2018   Left lumbar radiculopathy 01/07/2018   Diastolic dysfunction  10/15/2016   Psoriasis 10/15/2016   GERD 10/02/2007   Prolapsed internal hemorrhoids, grade 2 and 3 09/10/2006   SLEEP APNEA 09/10/2006    Past Medical History:  Diagnosis Date   Acute blood loss anemia 09/26/2022   Anemia 2019   Arachnoiditis    Arthritis    Bleeding internal hemorrhoids 09/10/2006   Qualifier: Diagnosis of  By: Erby Pian MD, Cornelius     Cancer [facility]) 2022   Cauda equina syndrome [facility]) 2011   Cerebellar infarct [facility])    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   anterior cer disc and fusion with plates I4-3 and C4-5 at [facility]   Colonic mass 09/29/2022   Constipation    Encounter for general adult medical examination with abnormal findings 06/27/2022   Enlarged prostate    GERD (gastroesophageal reflux disease)    Hemorrhoids    History of kidney stones    passed stones   History of lumbar laminectomy 05/12/2020   open L2-L5 at [facility]   Hx of colonic polyp 05/29/2018   05/2018 cecal ssp   Lung nodule 02/2021   Lyme disease    Maxillary sinus polyp    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   Pneumonia    Prolapsed internal hemorrhoids, grade 3 09/10/2006   Qualifier: Diagnosis of  By: Erby Pian MD, Cornelius     Salivary gland enlargement    Sleep apnea 10 years ago   declined CPAP   Stroke (HCC) 15-20 years ago    Family History  Problem Relation Age of Onset   Aortic aneurysm Mother    Varicose Veins Mother    Heart attack Father    Arthritis Father    Aortic aneurysm Sister    Early death Sister    CVA Sister    Aortic aneurysm Sister    Aortic aneurysm Sister    Colon cancer Neg Hx    Pancreatic cancer Neg Hx    Rectal cancer Neg Hx    Stomach cancer Neg Hx    Esophageal cancer Neg Hx    Colon polyps Neg Hx    Past Surgical History:  Procedure Laterality Date   BIOPSY  09/28/2022   Procedure: BIOPSY;  Surgeon: Sherrilyn Rist, MD;  Location: [facility];  Service: Gastroenterology;;   BRONCHIAL BIOPSY  05/16/2021   Procedure: BRONCHIAL BIOPSIES;   Surgeon: Josephine Igo, DO;  Location: [name];  Service: Pulmonary;;   BRONCHIAL BRUSHINGS  05/16/2021   Procedure: BRONCHIAL BRUSHINGS;  Surgeon: Josephine Igo, DO;  Location: [name];  Service: Pulmonary;;   BRONCHIAL NEEDLE ASPIRATION BIOPSY  05/16/2021   Procedure: BRONCHIAL NEEDLE ASPIRATION BIOPSIES;  Surgeon: Josephine Igo, DO;  Location: [name];  Service: Pulmonary;;   CERVICAL SPINE SURGERY  04/10/2020   C3-C4 with pin at [facility]   COLONOSCOPY     COLONOSCOPY WITH PROPOFOL N/A 09/28/2022   Procedure: COLONOSCOPY WITH PROPOFOL;  Surgeon: Sherrilyn Rist, MD;  Location: [facility];  Service: Gastroenterology;  Laterality: N/A;   ESOPHAGOGASTRODUODENOSCOPY (EGD) WITH PROPOFOL N/A 09/28/2022   Procedure: ESOPHAGOGASTRODUODENOSCOPY (EGD) WITH PROPOFOL;  Surgeon: Sherrilyn Rist, MD;  Location: [facility];  Service: Gastroenterology;  Laterality: N/A;   FIDUCIAL MARKER PLACEMENT  05/16/2021   Procedure: FIDUCIAL MARKER PLACEMENT;  Surgeon: Josephine Igo, DO;  Location: [name];  Service: Pulmonary;;   HEMORRHOID BANDING     LUMBAR LAMINECTOMY  03/08/2010   L2-3, cauda equina syndrome   lumbar laminectomy  05/12/2020   [facility]   SPINE SURGERY     UPPER GI ENDOSCOPY     VIDEO BRONCHOSCOPY WITH ENDOBRONCHIAL NAVIGATION Left 05/16/2021   Procedure: VIDEO BRONCHOSCOPY WITH ENDOBRONCHIAL NAVIGATION;  Surgeon: Josephine Igo, DO;  Location: [name];  Service: Pulmonary;  Laterality: Left;  ION w/ fiducial placement   VIDEO BRONCHOSCOPY WITH RADIAL ENDOBRONCHIAL ULTRASOUND  05/16/2021   Procedure: VIDEO BRONCHOSCOPY WITH RADIAL ENDOBRONCHIAL ULTRASOUND;  Surgeon: Josephine Igo, DO;  Location: [name];  Service: Pulmonary;;   wisdom teeth ext     Social History   Social History Narrative   He is 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   Immunization History  Administered Date(s) Administered   Covid-19, Mrna,Vaccine(Spikevax)78yrs and older 06/13/2022   Fluad Quad(high Dose 65+) 04/11/2022   Influenza Split 05/18/2014   Influenza Whole 04/28/2008   Influenza, High Dose Seasonal PF 03/31/2019, 04/08/2020   Influenza,inj,Quad PF,6+ Mos 05/01/2019   Influenza,inj,quad, With Preservative 05/13/2018   Influenza-Unspecified 05/13/2018, 04/29/2021, 05/05/2022   Moderna SARS-COV2 Booster Vaccination 06/01/2020   Moderna Sars-Covid-2 Vaccination 08/21/2019, 09/19/2019, 06/01/2020   Pneumococcal Conjugate-13 10/09/2013   Pneumococcal-Unspecified 10/09/2013   Rsv, Bivalent, Protein Subunit Rsvpref,pf Verdis Frederickson) 06/13/2022   Td 04/28/2008   Tdap 04/28/2008, 06/22/2021   Zoster Recombinant(Shingrix) 12/21/2014, 01/15/2022   Zoster, Live 12/21/2014   Zoster, Unspecified 12/21/2014     Objective: Vital Signs: BP 111/79 (BP Location: Left Arm, Patient Position: Sitting, Cuff Size: Normal)   Pulse 74   Resp 16   Ht 6' (1.829 m)   Wt 191 lb (86.6 kg)   BMI 25.90 kg/m    Physical Exam Vitals and nursing note reviewed.  Constitutional:      Appearance: He is well-developed.  HENT:     Head: Normocephalic and atraumatic.  Eyes:     Conjunctiva/sclera: Conjunctivae normal.     Pupils: Pupils are equal, round, and reactive to light.  Cardiovascular:     Rate and Rhythm: Normal rate and regular rhythm.     Heart sounds: Normal heart sounds.  Pulmonary:     Effort: Pulmonary effort is normal.     Breath sounds: Normal breath sounds.  Abdominal:     General: Bowel sounds are normal.     Palpations: Abdomen is soft.  Musculoskeletal:     Cervical back: Normal range of motion and neck supple.  Skin:    General: Skin is warm and dry.     Capillary Refill: Capillary refill takes less than 2 seconds.  Neurological:     Mental Status: He is alert and  oriented to person, place, and time.  Psychiatric:        Behavior: Behavior normal.      Musculoskeletal Exam: He had limited lateral rotation of the cervical spine.  He had painful limited range of motion lumbar spine with scoliosis.  Shoulders, elbows, wrist joints were in good range of motion.  He had incomplete  fist formation with bilateral PIP and DIP thickening and CMC prominence.  He gets inflammation and PIP joints.  Hip joints and knee joints were in good range of motion.  There was no tenderness over ankles or MTPs.  Prominence of right ankle joint was noted.  CDAI Exam: CDAI Score: -- Patient Global: --; Provider Global: -- Swollen: --; Tender: -- Joint Exam 03/13/2023   No joint exam has been documented for this visit   There is currently no information documented on the homunculus. Go to the Rheumatology activity and complete the homunculus joint exam.  Investigation: No additional findings.  Imaging: No results found.  Recent Labs: Lab Results  Component Value Date   WBC 5.5 01/14/2023   HGB 9.6 (L) 01/14/2023   PLT 348 01/14/2023   NA 138 01/14/2023   K 4.4 01/14/2023   CL 103 01/14/2023   CO2 23 01/14/2023   GLUCOSE 91 01/14/2023   BUN 13 01/14/2023   CREATININE 0.71 (L) 01/14/2023   BILITOT 0.8 09/29/2022   ALKPHOS 56 09/29/2022   AST 16 09/29/2022   ALT 13 09/29/2022   PROT 6.3 (L) 09/29/2022   ALBUMIN 3.6 09/29/2022   CALCIUM 9.3 01/14/2023   GFRAA >60 06/13/2018    Speciality Comments: No specialty comments available.  Procedures:  No procedures performed Allergies: Patient has no known allergies.   Assessment / Plan:     Visit Diagnoses: Positive ANA (antinuclear antibody) - he has positive ANA.  ENA is completely negative.  He denies any history of oral ulcers, nasal ulcers, malar rash, photosensitivity, Raynaud's phenomenon or lymphadenopathy.  He had no clinical features of autoimmune disease.  No further workup is needed.  Primary  osteoarthritis of both hands -he has severe inflammatory osteoarthritis.  He had PIP inflammation on the examination today.  He has been trying over-the-counter natural anti-inflammatories without much relief.  X-rays obtained in the past revealed second and third MCP joint narrowing and PIP and DIP severe narrowing.  He has incomplete fist formation bilaterally.  We discussed different treatment options.  I discussed the trial of either hydroxychloroquine or colchicine.  He does not want to take any immunosuppressive agents.  Hydroxychloroquine or colchicine.  He does not want to take any immunosuppressive agents.  Will try colchicine 0.6 mg p.o. daily.  Indications and side effects of colchicine were discussed.  A handout on colchicine was given.  I sent a prescription for colchicine 0.6 mg p.o. daily with 30-day supply and 2 refills.  I advised patient to not to take colchicine if he develops any side effects.  I also advised him to discontinue medication if he does not notice any benefit.  He will return for labs in 1 month if he continues to take colchicine.  Primary osteoarthritis of left knee-he continues to have some discomfort in his knee joint.  Primary osteoarthritis of both feet -he has off-and-on discomfort in his right ankle and both feet.  No synovitis was noted.  He has severe arthritis right ankle joint.  He states he has seen orthopedic surgeon at [facility] and will require right ankle joint surgery.  DDD (degenerative disc disease), cervical -he had limited range of motion of the cervical spine.  He was evaluated by Dr. Franky Macho in the past.  He had C-spine fusion in September 2021 by Dr. Andrey Campanile at [facility] for Ragan.  DDD (degenerative disc disease), lumbar - he had lumbar laminectomy by Dr. Andrey Campanile at [facility] in October 2021.  Patient  states he was diagnosed with cauda equina syndrome by Dr. Franky Macho.  He continues to have some numbness and weakness in his left lower  extremity.  History of colon cancer - dxd 03/24, s/p partial colectomy 04/24.  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  No chemotherapy or radiation therapy required.  Former smoker - He smoked  1 pack/day for 9 years.  He quit smoking in 1976.  [name] has been under a lot of stress because his wife has terminal ovarian cancer.  HYPERTENSION, BENIGN ESSENTIAL-blood pressure was normal today.  Other medical problems are listed as follows:  Diastolic dysfunction  NCGS (non-celiac gluten sensitivity)  Cerebellar infarct (HCC)  Hx of colonic polyp  Rosacea - he is followed by dermatology.  History of kidney stones  Orders: No orders of the defined types were placed in this encounter.  Meds ordered this encounter  Medications   colchicine 0.6 MG tablet    Sig: Take 1 tablet (0.6 mg total) by mouth daily.    Dispense:  30 tablet    Refill:  2     Follow-Up Instructions: Return in about 3 months (around 06/13/2023) for Osteoarthritis.   Pollyann Savoy, MD  Note - This record has been created using Animal nutritionist.  Chart creation errors have been sought, but may not always  have been located. Such creation errors do not reflect on  the standard of medical care.

## 2023-03-13 ENCOUNTER — Ambulatory Visit: Payer: Medicare PPO | Attending: Rheumatology | Admitting: Rheumatology

## 2023-03-13 ENCOUNTER — Encounter: Payer: Self-pay | Admitting: Rheumatology

## 2023-03-13 VITALS — BP 111/79 | HR 74 | Resp 16 | Ht 72.0 in | Wt 191.0 lb

## 2023-03-13 DIAGNOSIS — M19071 Primary osteoarthritis, right ankle and foot: Secondary | ICD-10-CM | POA: Diagnosis not present

## 2023-03-13 DIAGNOSIS — M19041 Primary osteoarthritis, right hand: Secondary | ICD-10-CM

## 2023-03-13 DIAGNOSIS — Z85038 Personal history of other malignant neoplasm of large intestine: Secondary | ICD-10-CM

## 2023-03-13 DIAGNOSIS — R768 Other specified abnormal immunological findings in serum: Secondary | ICD-10-CM

## 2023-03-13 DIAGNOSIS — I639 Cerebral infarction, unspecified: Secondary | ICD-10-CM

## 2023-03-13 DIAGNOSIS — Z87891 Personal history of nicotine dependence: Secondary | ICD-10-CM

## 2023-03-13 DIAGNOSIS — M1712 Unilateral primary osteoarthritis, left knee: Secondary | ICD-10-CM | POA: Diagnosis not present

## 2023-03-13 DIAGNOSIS — L719 Rosacea, unspecified: Secondary | ICD-10-CM

## 2023-03-13 DIAGNOSIS — F439 Reaction to severe stress, unspecified: Secondary | ICD-10-CM

## 2023-03-13 DIAGNOSIS — I10 Essential (primary) hypertension: Secondary | ICD-10-CM

## 2023-03-13 DIAGNOSIS — M5136 Other intervertebral disc degeneration, lumbar region: Secondary | ICD-10-CM | POA: Diagnosis not present

## 2023-03-13 DIAGNOSIS — I5189 Other ill-defined heart diseases: Secondary | ICD-10-CM

## 2023-03-13 DIAGNOSIS — Z87442 Personal history of urinary calculi: Secondary | ICD-10-CM

## 2023-03-13 DIAGNOSIS — M5030 Other cervical disc degeneration, unspecified cervical region: Secondary | ICD-10-CM | POA: Diagnosis not present

## 2023-03-13 DIAGNOSIS — M51369 Other intervertebral disc degeneration, lumbar region without mention of lumbar back pain or lower extremity pain: Secondary | ICD-10-CM

## 2023-03-13 DIAGNOSIS — M19042 Primary osteoarthritis, left hand: Secondary | ICD-10-CM

## 2023-03-13 DIAGNOSIS — C3432 Malignant neoplasm of lower lobe, left bronchus or lung: Secondary | ICD-10-CM

## 2023-03-13 DIAGNOSIS — K9041 Non-celiac gluten sensitivity: Secondary | ICD-10-CM

## 2023-03-13 DIAGNOSIS — Z860100 Personal history of colon polyps, unspecified: Secondary | ICD-10-CM

## 2023-03-13 DIAGNOSIS — M19072 Primary osteoarthritis, left ankle and foot: Secondary | ICD-10-CM

## 2023-03-13 DIAGNOSIS — Z79899 Other long term (current) drug therapy: Secondary | ICD-10-CM

## 2023-03-13 MED ORDER — COLCHICINE 0.6 MG PO TABS: 0.6000 mg | ORAL_TABLET | Freq: Every day | ORAL | 2 refills | Status: DC

## 2023-03-13 NOTE — Patient Instructions (Addendum)
Return for labs in 2 months if you continue to take colchicine.  Colchicine Tablets What is this medication? COLCHICINE (KOL chi seen) prevents and treats gout attacks. It may also be used to treat familial Mediterranean fever (FMF). It works by decreasing inflammation and reducing the buildup of uric acid in your joints. This medicine may be used for other purposes; ask your health care provider or pharmacist if you have questions. COMMON BRAND NAME(S): Colcrys, LODOCO What should I tell my care team before I take this medication? They need to know if you have any of these conditions: Kidney disease Liver disease An unusual or allergic reaction to colchicine, other medications, foods, dyes, or preservatives Pregnant or trying to get pregnant Breast-feeding How should I use this medication? Take this medication by mouth with a full glass of water. Take it as directed on the prescription label at the same time every day. You can take it with or without food. If it upsets your stomach, take it with food. A special MedGuide will be given to you by the pharmacist with each prescription and refill. Be sure to read this information carefully each time. Talk to your care team about the use of this medication in children. While it may be prescribed for children as young as 4 years for selected conditions, precautions do apply. People 65 years and older may have a stronger reaction and need a smaller dose. Overdosage: If you think you have taken too much of this medicine contact a poison control center or emergency room at once. NOTE: This medicine is only for you. Do not share this medicine with others. What if I miss a dose? If you miss a dose, take it as soon as you can. If it is almost time for your next dose, take only that dose. Do not take double or extra doses. What may interact with this medication? Do not take this medication with any of the following: Certain antivirals for HIV or  hepatitis This medication may also interact with the following: Certain antibiotics, such as erythromycin or clarithromycin Certain medications for blood pressure, heart disease, irregular heartbeat Certain medications for cholesterol, such as atorvastatin, lovastatin, or simvastatin Certain medications for fungal infections, such as ketoconazole, itraconazole, or posaconazole Cyclosporine Grapefruit or grapefruit juice This list may not describe all possible interactions. Give your health care provider a list of all the medicines, herbs, non-prescription drugs, or dietary supplements you use. Also tell them if you smoke, drink alcohol, or use illegal drugs. Some items may interact with your medicine. What should I watch for while using this medication? Visit your care team for regular checks on your progress. Tell your care team if your symptoms do not start to get better or if they get worse. You should make sure you get enough vitamin B12 while you are taking this medication. Discuss the foods you eat and the vitamins you take with your care team. This medication may increase your risk to bruise or bleed. Call you care team if you notice any unusual bleeding. What side effects may I notice from receiving this medication? Side effects that you should report to your care team as soon as possible: Allergic reactions--skin rash, itching, hives, swelling of the face, lips, tongue, or throat Infection--fever, chills, cough, sore throat, wounds that don't heal, pain or trouble when passing urine, general feeling of discomfort or being unwell Muscle injury--unusual weakness or fatigue, muscle pain, dark yellow or brown urine, decrease in the amount of urine  Pain, tingling, or numbness in the hands or feet Unusual bleeding or bruising Side effects that usually do not require medical attention (report these to your care team if they continue or are bothersome): Diarrhea Nausea Vomiting This list may  not describe all possible side effects. Call your doctor for medical advice about side effects. You may report side effects to FDA at 1-800-FDA-1088. Where should I keep my medication? Keep out of the reach of children and pets. Store at room temperature between 15 and 30 degrees C (59 and 86 degrees F). Keep container tightly closed. Protect from light. Throw away any unused medication after the expiration date. NOTE: This sheet is a summary. It may not cover all possible information. If you have questions about this medicine, talk to your doctor, pharmacist, or health care provider.  2024 Elsevier/Gold Standard (2022-01-31 00:00:00)

## 2023-03-20 ENCOUNTER — Telehealth: Payer: Self-pay | Admitting: Internal Medicine

## 2023-03-20 ENCOUNTER — Other Ambulatory Visit: Payer: Self-pay | Admitting: Internal Medicine

## 2023-03-20 NOTE — Telephone Encounter (Signed)
Patient informed that the rx has been refilled as requested. He is up to date on his visits.

## 2023-03-20 NOTE — Telephone Encounter (Signed)
Inbound call from patient requesting a refill for Prilosec medication. States [facility] should have reached out regarding prescription. Advised patient he has not had an office visit in over a year. Patient stated he had a procedure this year and followed up. States he is doing okay. Patient requesting a call back to discuss refill. Please advise, thank you.

## 2023-04-04 ENCOUNTER — Ambulatory Visit (INDEPENDENT_AMBULATORY_CARE_PROVIDER_SITE_OTHER): Payer: Medicare PPO

## 2023-04-04 VITALS — Ht 72.0 in | Wt 185.0 lb

## 2023-04-04 DIAGNOSIS — Z0000 Encounter for general adult medical examination without abnormal findings: Secondary | ICD-10-CM

## 2023-04-04 NOTE — Patient Instructions (Signed)
Russell [name] , Thank you for taking time to come for your Medicare Wellness Visit. I appreciate your ongoing commitment to your health goals. Please review the following plan we discussed and let me know if I can assist you in the future.   Referrals/Orders/Follow-Ups/Clinician Recommendations: Aim for 30 minutes of exercise or brisk walking, 6-8 glasses of water, and 5 servings of fruits and vegetables each day.   This is a list of the screening recommended for you and due dates:  Health Maintenance  Topic Date Due   Pneumonia Vaccine (2 of 2 - PPSV23 or PCV20) 10/10/2014   Flu Shot  02/14/2023   COVID-19 Vaccine (4 - 2023-24 season) 03/17/2023   Medicare Annual Wellness Visit  04/03/2024   Colon Cancer Screening  09/28/2027   DTaP/Tdap/Td vaccine (4 - Td or Tdap) 06/23/2031   Hepatitis C Screening  Completed   Zoster (Shingles) Vaccine  Completed   HPV Vaccine  Aged Out   Cologuard (Stool DNA test)  Discontinued    Advanced directives: (Copy Requested) Please bring a copy of your health care power of attorney and living will to the office to be added to your chart at your convenience.  Next Medicare Annual Wellness Visit scheduled for next year: Yes  insert Preventive Care attachment Insert FALL PREVENTION attachment if needed

## 2023-04-04 NOTE — Progress Notes (Signed)
Subjective:   Russell [name] is a 77 y.o. male who presents for Medicare Annual/Subsequent preventive examination.  Visit Complete: Virtual  I connected with  Russell [name] on 04/04/23 by a audio enabled telemedicine application and verified that I am speaking with the correct person using two identifiers.  Patient Location: Home  Provider Location: Home Office  I discussed the limitations of evaluation and management by telemedicine. The patient expressed understanding and agreed to proceed.  Patient Medicare AWV questionnaire was completed by the patient on 04/04/2023; I have confirmed that all information answered by patient is correct and no changes since this date.  Cardiac Risk Factors include: advanced age (>72men, >69 women);male genderVital Signs: Unable to obtain new vitals due to this being a telehealth visit.      Objective:    Today's Vitals   04/04/23 0804  Weight: 185 lb (83.9 kg)  Height: 6' (1.829 m)   Body mass index is 25.09 kg/m.     04/04/2023    8:11 AM 11/08/2022    6:00 PM 11/08/2022    7:03 AM 10/26/2022   10:32 AM 10/03/2022   10:35 AM 09/27/2022    4:00 PM 09/26/2022    6:44 PM  Advanced Directives  Does Patient Have a Medical Advance Directive? Yes Yes  Yes Yes Yes Yes  Type of Estate agent of [place];Living will Healthcare Power of [employer] of [place];Living will Healthcare Power of [place];Living will Healthcare Power of [employer] Power of [employer] Power of [place];Living will  Does patient want to make changes to medical advance directive?  No - Patient declined    No - Patient declined   Copy of Healthcare Power of Attorney in Chart? No - copy requested Yes - validated most recent copy scanned in chart (See row information)  Yes - validated most recent copy scanned in chart (See row information)       Current Medications (verified) Outpatient Encounter Medications as of  04/04/2023  Medication Sig   ASHWAGANDHA PO Take 150 mg by mouth in the morning and at bedtime.   aspirin EC 81 MG tablet Take 1 tablet (81 mg total) by mouth every evening.   colchicine 0.6 MG tablet Take 1 tablet (0.6 mg total) by mouth daily.   diclofenac Sodium (VOLTAREN) 1 % GEL Apply 2 g topically daily as needed (pain).   finasteride (PROSCAR) 5 MG tablet Take 1 tablet (5 mg total) by mouth daily.   gabapentin (NEURONTIN) 300 MG capsule Take 1 capsule (300 mg total) by mouth 3 (three) times daily. (Actually 300 mg in AM and 600 mg QHS) x 1 week, then 600 mg BID- for nerve pain (Patient taking differently: Take 600 mg by mouth 2 (two) times daily.)   Ginger, Zingiber officinalis, (GINGER PO) Take by mouth.   iron polysaccharides (NIFEREX) 150 MG capsule Take 1 capsule (150 mg total) by mouth daily. (Patient taking differently: Take 150 mg by mouth every evening.)   Magnesium 400 MG CAPS Take 400 mg by mouth 2 (two) times daily. asporotate   melatonin 5 MG TABS Take 5 mg by mouth at bedtime.   Methylcellulose, Laxative, (CITRUCEL PO) Take by mouth.   metoprolol succinate (TOPROL XL) 25 MG 24 hr tablet Take 1 tablet (25 mg total) by mouth daily.   Omega-3 Fatty Acids (FISH OIL PO) Take 460 mg by mouth 2 (two) times daily.   omeprazole (PRILOSEC) 40 MG capsule TAKE (1) CAPSULE BY MOUTH  ONCE DAILY.   Probiotic Product (PROBIOTIC DAILY PO) Take 1 tablet by mouth daily.   silodosin (RAPAFLO) 8 MG CAPS capsule Take 1 capsule (8 mg total) by mouth 2 (two) times daily.   TART CHERRY PO Take by mouth.   TURMERIC PO Take 500 mg by mouth 2 (two) times daily. curamed curcumin   No facility-administered encounter medications on file as of 04/04/2023.    Allergies (verified) Patient has no known allergies.   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   anterior cer disc and fusion with plates W0-9 and C4-5 at [facility]   Colonic mass 09/29/2022   Constipation    Encounter for general adult medical examination with abnormal findings 06/27/2022   Enlarged prostate    GERD (gastroesophageal reflux disease)    Hemorrhoids    History of kidney stones    passed stones   History of lumbar laminectomy 05/12/2020   open L2-L5 at [facility]   Hx of colonic polyp 05/29/2018   05/2018 cecal ssp   Lung nodule 02/2021   Lyme disease    Maxillary sinus polyp    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   Pneumonia    Prolapsed internal hemorrhoids, grade 3 09/10/2006   Qualifier: Diagnosis of  By: Erby Pian MD, Cornelius     Salivary gland enlargement    Sleep apnea 10 years ago   declined CPAP   Stroke (HCC) 15-20 years ago   Past Surgical History:  Procedure Laterality Date   BIOPSY  09/28/2022   Procedure: BIOPSY;  Surgeon: Sherrilyn Rist, MD;  Location: [facility];  Service: Gastroenterology;;   BRONCHIAL BIOPSY  05/16/2021   Procedure: BRONCHIAL BIOPSIES;  Surgeon: Josephine Igo, DO;  Location: [name];  Service: Pulmonary;;   BRONCHIAL BRUSHINGS  05/16/2021   Procedure: BRONCHIAL BRUSHINGS;  Surgeon: Josephine Igo, DO;  Location: [name];  Service: Pulmonary;;   BRONCHIAL NEEDLE ASPIRATION BIOPSY  05/16/2021   Procedure: BRONCHIAL NEEDLE ASPIRATION BIOPSIES;  Surgeon: Josephine Igo, DO;  Location: [name];  Service: Pulmonary;;   CERVICAL SPINE SURGERY  04/10/2020   C3-C4 with pin at [facility]   COLONOSCOPY     COLONOSCOPY WITH PROPOFOL N/A 09/28/2022   Procedure: COLONOSCOPY WITH PROPOFOL;  Surgeon: Sherrilyn Rist, MD;  Location: [facility] END[facility]COPY;  Service: Gastroenterology;  Laterality: N/A;   ESOPHAGOGASTRODUODENOSCOPY (EGD) WITH PROPOFOL N/A 09/28/2022    Procedure: ESOPHAGOGASTRODUODENOSCOPY (EGD) WITH PROPOFOL;  Surgeon: Sherrilyn Rist, MD;  Location: [facility];  Service: Gastroenterology;  Laterality: N/A;   FIDUCIAL MARKER PLACEMENT  05/16/2021   Procedure: FIDUCIAL MARKER PLACEMENT;  Surgeon: Josephine Igo, DO;  Location: [name];  Service: Pulmonary;;   HEMORRHOID BANDING     LUMBAR LAMINECTOMY  03/08/2010   L2-3, cauda equina syndrome   lumbar laminectomy  05/12/2020   [facility]   SPINE SURGERY     UPPER GI ENDOSCOPY     VIDEO BRONCHOSCOPY WITH ENDOBRONCHIAL NAVIGATION Left 05/16/2021   Procedure: VIDEO BRONCHOSCOPY WITH ENDOBRONCHIAL NAVIGATION;  Surgeon: Josephine Igo, DO;  Location: [name];  Service: Pulmonary;  Laterality: Left;  ION w/ fiducial placement   VIDEO BRONCHOSCOPY WITH RADIAL ENDOBRONCHIAL ULTRASOUND  05/16/2021   Procedure: VIDEO BRONCHOSCOPY WITH RADIAL ENDOBRONCHIAL ULTRASOUND;  Surgeon: Josephine Igo, DO;  Location: [name];  Service: Pulmonary;;   wisdom teeth ext     Family History  Problem Relation Age of Onset   Aortic aneurysm Mother    Varicose Veins Mother    Heart attack Father    Arthritis Father    Aortic aneurysm Sister    Early death Sister    CVA Sister    Aortic aneurysm Sister    Aortic aneurysm Sister    Colon cancer Neg Hx    Pancreatic cancer Neg Hx    Rectal cancer Neg Hx    Stomach cancer Neg Hx    Esophageal cancer Neg Hx    Colon polyps Neg Hx    Social History   Socioeconomic History   Marital status: Married    Spouse name: Not on file   Number of children: 0   Years of education: Not on file   Highest education level: Master's degree (e.g., MA, MS, MEng, MEd, MSW, MBA)  Occupational History   Occupation: retired  Tobacco Use   Smoking status: Former    Current packs/day: 0.00    Average packs/day: 0.3 packs/day for 9.0 years (2.3 ttl pk-yrs)    Types: Cigarettes    Start date: 10/15/1965    Quit date: 10/16/1974    Years since quitting: 48.4     Passive exposure: Past   Smokeless tobacco: Never  Vaping Use   Vaping status: Never Used  Substance and Sexual Activity   Alcohol use: Not Currently   Drug use: No   Sexual activity: Not Currently  Other Topics Concern   Not on file  Social History Narrative   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   Social Determinants of Health   Financial Resource Strain: Low Risk  (04/04/2023)   Overall Financial Resource Strain (CARDIA)    Difficulty of Paying Living Expenses: Not hard at all  Food Insecurity: No Food Insecurity (04/04/2023)   Hunger Vital Sign    Worried About Running Out of Food in the Last Year: Never true    Ran Out of Food in the Last Year: Never true  Transportation Needs: No Transportation Needs (04/04/2023)   PRAPARE - Administrator, Civil Service (Medical): No    Lack of Transportation (Non-Medical): No  Physical Activity: Insufficiently Active (04/04/2023)   Exercise Vital Sign    Days of Exercise per Week: 3 days    Minutes of Exercise per Session: 30 min  Stress: No Stress Concern Present (04/04/2023)   [employer] of Occupational Health - Occupational Stress Questionnaire    Feeling of Stress : Not at all  Social Connections: Moderately Isolated (04/04/2023)   Social Connection and Isolation Panel [NHANES]    Frequency of Communication with Friends and Family: More than three times a week    Frequency of Social Gatherings with Friends and Family: More than three times a week    Attends Religious Services: Never    Database administrator or Organizations: No    Attends Banker Meetings: Never    Marital Status: Married  Tobacco Counseling Counseling given: Not Answered   Clinical Intake:  Pre-visit preparation completed: Yes  Pain :  No/denies pain     Nutritional Risks: None Diabetes: No  How often do you need to have someone help you when you read instructions, pamphlets, or other written materials from your doctor or pharmacy?: 1 - Never  Interpreter Needed?: No  Information entered by :: Renie Ora, LPN   Activities of Daily Living    04/04/2023    8:11 AM 11/08/2022    6:00 PM  In your present state of health, do you have any difficulty performing the following activities:  Hearing? 0 0  Vision? 0 0  Difficulty concentrating or making decisions? 0 0  Walking or climbing stairs? 0 1  Dressing or bathing? 0 0  Doing errands, shopping? 0 0  Preparing Food and eating ? N   Using the Toilet? N   In the past six months, have you accidently leaked urine? N   Do you have problems with loss of bowel control? N   Managing your Medications? N   Managing your Finances? N   Housekeeping or managing your Housekeeping? N     Patient Care Team: Anabel Halon, MD as PCP - General (Internal Medicine) Lennette Bihari, MD as PCP - Cardiology (Cardiology) Janalyn Harder, MD (Inactive) as Consulting Physician (Dermatology)  Indicate any recent Medical Services you may have received from other than [facility] providers in the past year (date may be approximate).     Assessment:   This is a routine wellness examination for Russell [name].  Hearing/Vision screen Vision Screening - Comments:: Patient states he will schedule -   Goals Addressed             This Visit's Progress    Patient Stated   On track    Maintain my current health status. Watch my weight and stay active.       Depression Screen    04/04/2023    8:10 AM 01/14/2023    9:11 AM 11/30/2022    9:30 AM 09/26/2022    2:49 PM 08/31/2022    9:10 AM 07/19/2022    1:24 PM 06/27/2022    8:10 AM  PHQ 2/9 Scores  PHQ - 2 Score 0 0 0 0 3 1 1   PHQ- 9 Score     6      Fall Risk    04/04/2023    8:06 AM 01/14/2023    9:11 AM 11/30/2022    9:30 AM 10/08/2022     3:48 PM 09/24/2022   10:18 AM  Fall Risk   Falls in the past year? 0 0 0 1 1  Number falls in past yr: 0 0 0 1 1  Injury with Fall? 0 0 0 0 0  Risk for fall due to : No Fall Risks      Follow up Falls prevention discussed        MEDICARE RISK AT HOME: Medicare Risk at Home Any stairs in or around the home?: Yes If so, are there any without handrails?: No Home free of loose throw rugs in walkways, pet beds, electrical cords, etc?: Yes Adequate lighting in your home to reduce risk of falls?: Yes Life alert?: No Use of a cane, walker or w/c?: No Grab bars in the bathroom?: Yes Shower chair or bench in shower?: Yes Elevated toilet seat or a handicapped toilet?: Yes  TIMED UP AND GO:  Was the test performed?  No  Cognitive Function:    01/31/2022    2:38 PM  MMSE - Mini Mental State Exam  Not completed: Unable to complete        04/04/2023    8:12 AM 01/31/2022    2:38 PM  6CIT Screen  What Year? 0 points 0 points  What month? 0 points 0 points  What time? 0 points 0 points  Count back from 20 0 points 0 points  Months in reverse 0 points 0 points  Repeat phrase 0 points 0 points  Total Score 0 points 0 points    Immunizations Immunization History  Administered Date(s) Administered   Covid-19, Mrna,Vaccine(Spikevax)58yrs and older 06/13/2022   Fluad Quad(high Dose 65+) 04/11/2022   Influenza Split 05/18/2014   Influenza Whole 04/28/2008   Influenza, High Dose Seasonal PF 03/31/2019, 04/08/2020   Influenza,inj,Quad PF,6+ Mos 05/01/2019   Influenza,inj,quad, With Preservative 05/13/2018   Influenza-Unspecified 05/13/2018, 04/29/2021, 05/05/2022   Moderna SARS-COV2 Booster Vaccination 06/01/2020   Moderna Sars-Covid-2 Vaccination 08/21/2019, 09/19/2019, 06/01/2020   Pneumococcal Conjugate-13 10/09/2013   Pneumococcal-Unspecified 10/09/2013   Rsv, Bivalent, Protein Subunit Rsvpref,pf Verdis Frederickson) 06/13/2022   Td 04/28/2008   Tdap 04/28/2008, 06/22/2021   Zoster  Recombinant(Shingrix) 12/21/2014, 01/15/2022   Zoster, Live 12/21/2014   Zoster, Unspecified 12/21/2014    TDAP status: Up to date  Flu Vaccine status: Up to date  Pneumococcal vaccine status: Up to date  Covid-19 vaccine status: Completed vaccines  Qualifies for Shingles Vaccine? Yes   Zostavax completed Yes   Shingrix Completed?: Yes  Screening Tests Health Maintenance  Topic Date Due   Pneumonia Vaccine 54+ Years old (2 of 2 - PPSV23 or PCV20) 10/10/2014   INFLUENZA VACCINE  02/14/2023   COVID-19 Vaccine (4 - 2023-24 season) 03/17/2023   Medicare Annual Wellness (AWV)  04/03/2024   Colonoscopy  09/28/2027   DTaP/Tdap/Td (4 - Td or Tdap) 06/23/2031   Hepatitis C Screening  Completed   Zoster Vaccines- Shingrix  Completed   HPV VACCINES  Aged Out   Fecal DNA (Cologuard)  Discontinued    Health Maintenance  Health Maintenance Due  Topic Date Due   Pneumonia Vaccine 58+ Years old (2 of 2 - PPSV23 or PCV20) 10/10/2014   INFLUENZA VACCINE  02/14/2023   COVID-19 Vaccine (4 - 2023-24 season) 03/17/2023    Colorectal cancer screening: Type of screening: Colonoscopy. Completed 09/28/2022. Repeat every 3 years  Lung Cancer Screening: (Low Dose CT Chest recommended if Age 57-80 years, 20 pack-year currently smoking OR have quit w/in 15years.) does not qualify.   Lung Cancer Screening Referral: n/a  Additional Screening:  Hepatitis C Screening: does not qualify; Completed 01/14/2023  Vision Screening: Recommended annual ophthalmology exams for early detection of glaucoma and other disorders of the eye. Is the patient up to date with their annual eye exam?  No  Who is the provider or what is the name of the office in which the patient attends annual eye exams? Patient states he will schedule  If pt is not established with a provider, would they like to be referred to a provider to establish care? No .   Dental Screening: Recommended annual dental exams for proper oral  hygiene   Community Resource Referral / Chronic Care Management: CRR required this visit?  No   CCM required this visit?  No     Plan:     I have personally reviewed and noted the following in the patient's chart:   Medical and social history Use of alcohol,  tobacco or illicit drugs  Current medications and supplements including opioid prescriptions. Patient is not currently taking opioid prescriptions. Functional ability and status Nutritional status Physical activity Advanced directives List of other physicians Hospitalizations, surgeries, and ER visits in previous 12 months Vitals Screenings to include cognitive, depression, and falls Referrals and appointments  In addition, I have reviewed and discussed with patient certain preventive protocols, quality metrics, and best practice recommendations. A written personalized care plan for preventive services as well as general preventive health recommendations were provided to patient.     Lorrene Reid, LPN   1/61/0960   After Visit Summary: (MyChart) Due to this being a telephonic visit, the after visit summary with patients personalized plan was offered to patient via MyChart   Nurse Notes: none

## 2023-04-08 ENCOUNTER — Telehealth: Payer: Self-pay | Admitting: Urology

## 2023-04-08 DIAGNOSIS — R339 Retention of urine, unspecified: Secondary | ICD-10-CM

## 2023-04-08 MED ORDER — SILODOSIN 8 MG PO CAPS
8.0000 mg | ORAL_CAPSULE | Freq: Two times a day (BID) | ORAL | 0 refills | Status: DC
Start: 2023-04-08 — End: 2023-04-24

## 2023-04-08 NOTE — Telephone Encounter (Signed)
Tried calling patient with no answer.

## 2023-04-08 NOTE — Telephone Encounter (Signed)
Patient called to get refill on medication until his next appointment - call this RX into [facility] until he comes in on Oct 9,2024    Medication to refill  silodosin (RAPAFLO) 8 MG CAPS capsule

## 2023-04-09 NOTE — Telephone Encounter (Signed)
Patient is made aware and voiced understanding. 

## 2023-04-18 ENCOUNTER — Other Ambulatory Visit: Payer: Self-pay

## 2023-04-18 ENCOUNTER — Telehealth: Payer: Self-pay | Admitting: Urology

## 2023-04-18 DIAGNOSIS — R339 Retention of urine, unspecified: Secondary | ICD-10-CM

## 2023-04-18 NOTE — Telephone Encounter (Signed)
Return call to patient. Patient states his Rx has already be filled.

## 2023-04-18 NOTE — Telephone Encounter (Signed)
Pharmacy called for a medication refill on  silodosin (RAPAFLO) 8 MG CAPS capsule [[id number]]

## 2023-04-24 ENCOUNTER — Ambulatory Visit: Payer: Medicare PPO | Admitting: Urology

## 2023-04-24 VITALS — BP 144/81 | HR 56

## 2023-04-24 DIAGNOSIS — R972 Elevated prostate specific antigen [PSA]: Secondary | ICD-10-CM | POA: Diagnosis not present

## 2023-04-24 DIAGNOSIS — N401 Enlarged prostate with lower urinary tract symptoms: Secondary | ICD-10-CM | POA: Diagnosis not present

## 2023-04-24 DIAGNOSIS — N138 Other obstructive and reflux uropathy: Secondary | ICD-10-CM | POA: Diagnosis not present

## 2023-04-24 DIAGNOSIS — R339 Retention of urine, unspecified: Secondary | ICD-10-CM

## 2023-04-24 LAB — BLADDER SCAN AMB NON-IMAGING: Scan Result: 152

## 2023-04-24 MED ORDER — SILODOSIN 8 MG PO CAPS
8.0000 mg | ORAL_CAPSULE | Freq: Two times a day (BID) | ORAL | 11 refills | Status: DC
Start: 2023-04-24 — End: 2024-04-24

## 2023-04-24 MED ORDER — FINASTERIDE 5 MG PO TABS: 5.0000 mg | ORAL_TABLET | Freq: Every day | ORAL | 3 refills | Status: DC

## 2023-04-24 MED ORDER — NITROGLYCERIN 0.4 % RE OINT: 1.0000 g | TOPICAL_OINTMENT | RECTAL | 4 refills | Status: DC | PRN

## 2023-04-24 MED ORDER — SILODOSIN 8 MG PO CAPS
8.0000 mg | ORAL_CAPSULE | Freq: Two times a day (BID) | ORAL | 11 refills | Status: DC
Start: 2023-04-24 — End: 2023-04-24

## 2023-04-24 NOTE — Progress Notes (Signed)
04/24/2023 2:54 PM   Russell [name] Apr 04, 1946 [id number]  Referring provider: Anabel Halon, MD [address] [place],  [place] [address]  Followup BPH and elevated PSA   HPI: Russell [name] is a 77yo here for followup for BPH and elevated PSA. He ran out of silodosin and his stream weakened. IPSS 20 QOL 2 on rapaflo 8mg  BID and finasteride 5mg  daily. PVR 152cc. No feeling of incomplete emptying. NO recent PSA.   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   anterior cer disc and fusion with plates X9-1 and C4-5 at [facility]   Colonic mass 09/29/2022   Constipation    Encounter for general adult medical examination with abnormal findings 06/27/2022   Enlarged prostate    GERD (gastroesophageal reflux disease)    Hemorrhoids    History of kidney stones    passed stones   History of lumbar laminectomy 05/12/2020   open L2-L5 at [facility]   Hx of colonic polyp 05/29/2018   05/2018 cecal ssp   Lung nodule 02/2021   Lyme disease    Maxillary sinus polyp    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   Pneumonia    Prolapsed internal hemorrhoids, grade 3 09/10/2006   Qualifier: Diagnosis of  By: Erby Pian MD, Cornelius     Salivary gland enlargement    Sleep apnea 10 years ago   declined CPAP   Stroke (HCC) 15-20 years ago    Surgical History: Past Surgical History:  Procedure Laterality Date   BIOPSY  09/28/2022   Procedure: BIOPSY;  Surgeon: Sherrilyn Rist, MD;  Location: [facility];  Service: Gastroenterology;;   BRONCHIAL BIOPSY  05/16/2021   Procedure: BRONCHIAL BIOPSIES;  Surgeon: Josephine Igo, DO;  Location: [name];  Service: Pulmonary;;   BRONCHIAL BRUSHINGS   05/16/2021   Procedure: BRONCHIAL BRUSHINGS;  Surgeon: Josephine Igo, DO;  Location: [name];  Service: Pulmonary;;   BRONCHIAL NEEDLE ASPIRATION BIOPSY  05/16/2021   Procedure: BRONCHIAL NEEDLE ASPIRATION BIOPSIES;  Surgeon: Josephine Igo, DO;  Location: [name];  Service: Pulmonary;;   CERVICAL SPINE SURGERY  04/10/2020   C3-C4 with pin at [facility]   COLONOSCOPY     COLONOSCOPY WITH PROPOFOL N/A 09/28/2022   Procedure: COLONOSCOPY WITH PROPOFOL;  Surgeon: Sherrilyn Rist, MD;  Location: [facility];  Service: Gastroenterology;  Laterality: N/A;   ESOPHAGOGASTRODUODENOSCOPY (EGD) WITH PROPOFOL N/A 09/28/2022   Procedure: ESOPHAGOGASTRODUODENOSCOPY (EGD) WITH PROPOFOL;  Surgeon: Sherrilyn Rist, MD;  Location: [facility];  Service: Gastroenterology;  Laterality: N/A;   FIDUCIAL MARKER PLACEMENT  05/16/2021   Procedure: FIDUCIAL MARKER PLACEMENT;  Surgeon: Josephine Igo, DO;  [name];  Service: Pulmonary;;   HEMORRHOID BANDING     LUMBAR LAMINECTOMY  03/08/2010   L2-3, cauda equina syndrome   lumbar laminectomy  05/12/2020   [facility]   SPINE SURGERY     UPPER GI ENDOSCOPY     VIDEO BRONCHOSCOPY WITH ENDOBRONCHIAL NAVIGATION Left 05/16/2021   Procedure: VIDEO BRONCHOSCOPY WITH ENDOBRONCHIAL NAVIGATION;  Surgeon: Josephine Igo, DO;  [name];  Service: Pulmonary;  Laterality: Left;  ION w/ fiducial placement   VIDEO BRONCHOSCOPY WITH RADIAL ENDOBRONCHIAL ULTRASOUND  05/16/2021   Procedure: VIDEO BRONCHOSCOPY WITH RADIAL ENDOBRONCHIAL ULTRASOUND;  Surgeon: Josephine Igo, DO;  [name];  Service: Pulmonary;;   wisdom teeth ext      Home Medications:  Allergies as of 04/24/2023   No Known Allergies      Medication List        Accurate as of April 24, 2023  2:54 PM. If you have any questions, ask your nurse or doctor.          ASHWAGANDHA PO Take 150 mg by mouth in the morning and at bedtime.   aspirin EC 81 MG  tablet Take 1 tablet (81 mg total) by mouth every evening.   CITRUCEL PO Take by mouth.   colchicine 0.6 MG tablet Take 1 tablet (0.6 mg total) by mouth daily.   finasteride 5 MG tablet Commonly known as: PROSCAR Take 1 tablet (5 mg total) by mouth daily.   FISH OIL PO Take 460 mg by mouth 2 (two) times daily.   gabapentin 300 MG capsule Commonly known as: NEURONTIN Take 1 capsule (300 mg total) by mouth 3 (three) times daily. (Actually 300 mg in AM and 600 mg QHS) x 1 week, then 600 mg BID- for nerve pain What changed:  how much to take when to take this additional instructions   GINGER PO Take by mouth.   iron polysaccharides 150 MG capsule Commonly known as: NIFEREX Take 1 capsule (150 mg total) by mouth daily. What changed: when to take this   Magnesium 400 MG Caps Take 400 mg by mouth 2 (two) times daily. asporotate   melatonin 5 MG Tabs Take 5 mg by mouth at bedtime.   metoprolol succinate 25 MG 24 hr tablet Commonly known as: Toprol XL Take 1 tablet (25 mg total) by mouth daily.   omeprazole 40 MG capsule Commonly known as: PRILOSEC TAKE (1) CAPSULE BY MOUTH ONCE DAILY.   PROBIOTIC DAILY PO Take 1 tablet by mouth daily.   silodosin 8 MG Caps capsule Commonly known as: RAPAFLO Take 1 capsule (8 mg total) by mouth 2 (two) times daily.   TART CHERRY PO Take by mouth.   TURMERIC PO Take 500 mg by mouth 2 (two) times daily. curamed curcumin   Voltaren 1 % Gel Generic drug: diclofenac Sodium Apply 2 g topically daily as needed (pain).        Allergies: No Known Allergies  Family History: Family History  Problem Relation Age of Onset   Aortic aneurysm Mother    Varicose Veins Mother    Heart attack Father    Arthritis Father    Aortic aneurysm Sister    Early death Sister    CVA Sister    Aortic aneurysm Sister    Aortic aneurysm Sister    Colon cancer Neg Hx    Pancreatic cancer Neg Hx    Rectal cancer Neg Hx    Stomach cancer Neg  Hx    Esophageal cancer Neg Hx    Colon polyps Neg Hx     Social History:  reports that he quit smoking about 48 years ago. His smoking use included cigarettes. He started smoking about 57 years ago. He has a 2.3 pack-year smoking history. He has been exposed to tobacco smoke. He has never used smokeless tobacco. He reports that he does not currently use alcohol. He reports that he does not use  drugs.  ROS: All other review of systems were reviewed and are negative except what is noted above in HPI  Physical Exam: BP (!) 144/81   Pulse (!) 56   Constitutional:  Alert and oriented, No acute distress. HEENT: [place] AT, moist mucus membranes.  Trachea midline, no masses. Cardiovascular: No clubbing, cyanosis, or edema. Respiratory: Normal respiratory effort, no increased work of breathing. GI: Abdomen is soft, nontender, nondistended, no abdominal masses GU: No CVA tenderness.  Lymph: No cervical or inguinal lymphadenopathy. Skin: No rashes, bruises or suspicious lesions. Neurologic: Grossly intact, no focal deficits, moving all 4 extremities. Psychiatric: Normal mood and affect.  Laboratory Data: Lab Results  Component Value Date   WBC 5.5 01/14/2023   HGB 9.6 (L) 01/14/2023   HCT 32.7 (L) 01/14/2023   MCV 70 (L) 01/14/2023   PLT 348 01/14/2023    Lab Results  Component Value Date   CREATININE 0.71 (L) 01/14/2023    Lab Results  Component Value Date   PSA 1.95 09/28/2019   PSA 1.10 02/10/2009    Lab Results  Component Value Date   TESTOSTERONE 467.03 09/12/2006    Lab Results  Component Value Date   HGBA1C 5.6 09/29/2019    Urinalysis    Component Value Date/Time   COLORURINE YELLOW 03/07/2010 2315   APPEARANCEUR Clear 01/22/2022 1005   LABSPEC 1.021 03/07/2010 2315   PHURINE 6.0 03/07/2010 2315   GLUCOSEU Negative 01/22/2022 1005   HGBUR NEGATIVE 03/07/2010 2315   BILIRUBINUR Negative 01/22/2022 1005   KETONESUR NEGATIVE 03/07/2010 2315   PROTEINUR  Negative 01/22/2022 1005   PROTEINUR NEGATIVE 03/07/2010 2315   UROBILINOGEN 1.0 03/20/2018 1406   UROBILINOGEN 0.2 03/07/2010 2315   NITRITE Negative 01/22/2022 1005   NITRITE NEGATIVE 03/07/2010 2315   LEUKOCYTESUR Negative 01/22/2022 1005    Lab Results  Component Value Date   LABMICR Comment 01/22/2022   WBCUA None seen 03/08/2021   LABEPIT 0-10 03/08/2021   BACTERIA None seen 03/08/2021    Pertinent Imaging:  No results found for this or any previous visit.  No results found for this or any previous visit.  No results found for this or any previous visit.  No results found for this or any previous visit.  No results found for this or any previous visit.  No valid procedures specified. No results found for this or any previous visit.  No results found for this or any previous visit.   Assessment & Plan:    1. Urinary retention Rapaflo 8mg  daily and finasteride 5mg  daily - Urinalysis, Routine w reflex microscopic - BLADDER SCAN AMB NON-IMAGING  2. Benign prostatic hyperplasia with urinary obstruction -finasteride 5mg  daily and rapaflo 8mg  daily  3. Elevated PSA -PSA today, will call with results   No follow-ups on file.  Wilkie Aye, MD  [facility] [place]

## 2023-04-24 NOTE — Progress Notes (Signed)
post void residual=152 ?

## 2023-04-25 LAB — URINALYSIS, ROUTINE W REFLEX MICROSCOPIC
Bilirubin, UA: NEGATIVE
Glucose, UA: NEGATIVE
Ketones, UA: NEGATIVE
Leukocytes,UA: NEGATIVE
Nitrite, UA: NEGATIVE
Protein,UA: NEGATIVE
RBC, UA: NEGATIVE
Specific Gravity, UA: 1.01 (ref 1.005–1.030)
Urobilinogen, Ur: 0.2 mg/dL (ref 0.2–1.0)
pH, UA: 7 (ref 5.0–7.5)

## 2023-04-25 LAB — PSA: Prostate Specific Ag, Serum: 1.1 ng/mL (ref 0.0–4.0)

## 2023-04-29 ENCOUNTER — Other Ambulatory Visit: Payer: Self-pay | Admitting: Physical Medicine and Rehabilitation

## 2023-04-29 DIAGNOSIS — M5416 Radiculopathy, lumbar region: Secondary | ICD-10-CM

## 2023-04-30 ENCOUNTER — Other Ambulatory Visit: Payer: Self-pay | Admitting: *Deleted

## 2023-04-30 ENCOUNTER — Encounter: Payer: Self-pay | Admitting: Urology

## 2023-04-30 DIAGNOSIS — M5416 Radiculopathy, lumbar region: Secondary | ICD-10-CM

## 2023-04-30 MED ORDER — GABAPENTIN 300 MG PO CAPS: 600.0000 mg | ORAL_CAPSULE | Freq: Two times a day (BID) | ORAL | 1 refills | Status: DC

## 2023-04-30 NOTE — Patient Instructions (Signed)

## 2023-04-30 NOTE — Telephone Encounter (Signed)
Patient needs gabapentin refill sent to [facility] in [place].This is a different pharmacy than last time. He said he is having issues because he has not had the medication in 5 days.

## 2023-04-30 NOTE — Telephone Encounter (Signed)
[name] called in this morning requesting refill on his gabapentin, he does have an appointment next month.   York Spaniel he has been out for a week and he is desperate at this point.

## 2023-05-01 ENCOUNTER — Other Ambulatory Visit: Payer: Self-pay | Admitting: Physical Medicine and Rehabilitation

## 2023-05-01 DIAGNOSIS — C3432 Malignant neoplasm of lower lobe, left bronchus or lung: Secondary | ICD-10-CM | POA: Diagnosis not present

## 2023-05-01 DIAGNOSIS — C182 Malignant neoplasm of ascending colon: Secondary | ICD-10-CM | POA: Diagnosis not present

## 2023-05-01 DIAGNOSIS — M5416 Radiculopathy, lumbar region: Secondary | ICD-10-CM

## 2023-05-01 DIAGNOSIS — C801 Malignant (primary) neoplasm, unspecified: Secondary | ICD-10-CM | POA: Diagnosis not present

## 2023-05-02 ENCOUNTER — Other Ambulatory Visit: Payer: Self-pay | Admitting: Physical Medicine and Rehabilitation

## 2023-05-02 DIAGNOSIS — M5416 Radiculopathy, lumbar region: Secondary | ICD-10-CM

## 2023-05-03 DIAGNOSIS — Z902 Acquired absence of lung [part of]: Secondary | ICD-10-CM | POA: Diagnosis not present

## 2023-05-03 DIAGNOSIS — Z85118 Personal history of other malignant neoplasm of bronchus and lung: Secondary | ICD-10-CM | POA: Diagnosis not present

## 2023-05-03 DIAGNOSIS — G834 Cauda equina syndrome: Secondary | ICD-10-CM | POA: Diagnosis not present

## 2023-05-03 DIAGNOSIS — J8410 Pulmonary fibrosis, unspecified: Secondary | ICD-10-CM | POA: Diagnosis not present

## 2023-05-03 DIAGNOSIS — Z23 Encounter for immunization: Secondary | ICD-10-CM | POA: Diagnosis not present

## 2023-05-03 DIAGNOSIS — Z08 Encounter for follow-up examination after completed treatment for malignant neoplasm: Secondary | ICD-10-CM | POA: Diagnosis not present

## 2023-05-03 DIAGNOSIS — Z79899 Other long term (current) drug therapy: Secondary | ICD-10-CM | POA: Diagnosis not present

## 2023-05-03 DIAGNOSIS — Z9049 Acquired absence of other specified parts of digestive tract: Secondary | ICD-10-CM | POA: Diagnosis not present

## 2023-05-03 DIAGNOSIS — Z85038 Personal history of other malignant neoplasm of large intestine: Secondary | ICD-10-CM | POA: Diagnosis not present

## 2023-05-03 DIAGNOSIS — C3432 Malignant neoplasm of lower lobe, left bronchus or lung: Secondary | ICD-10-CM | POA: Diagnosis not present

## 2023-05-22 NOTE — Progress Notes (Deleted)
Office Visit Note  Patient: Russell [name]             Date of Birth: Apr 13, 1946           MRN: [id number]             PCP: Anabel Halon, MD Referring: Anabel Halon, MD Visit Date: 06/05/2023 Occupation: @GUAROCC @  Subjective:  No chief complaint on file.   History of Present Illness: Russell [name] is a 77 y.o. male ***     Activities of Daily Living:  Patient reports morning stiffness for *** {minute/hour:19697}.   Patient {ACTIONS;DENIES/REPORTS:21021675::"Denies"} nocturnal pain.  Difficulty dressing/grooming: {ACTIONS;DENIES/REPORTS:21021675::"Denies"} Difficulty climbing stairs: {ACTIONS;DENIES/REPORTS:21021675::"Denies"} Difficulty getting out of chair: {ACTIONS;DENIES/REPORTS:21021675::"Denies"} Difficulty using hands for taps, buttons, cutlery, and/or writing: {ACTIONS;DENIES/REPORTS:21021675::"Denies"}  No Rheumatology ROS completed.   PMFS History:  Patient Active Problem List   Diagnosis Date Noted   H/O right hemicolectomy 01/14/2023   Erythema migrans (Lyme disease) 12/31/2022   Neurogenic bowel 11/30/2022   Colon cancer (HCC) 11/08/2022   IDA (iron deficiency anemia) 09/26/2022   History of cancer of lower lobe bronchus or lung - s/p left lower lobectomy at [facility] 09-07-2021 09/26/2022   Superficial vein thrombosis 09/24/2022   Neuropathic pain 08/31/2022   Neurogenic bladder 08/31/2022   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   Encounter for general adult medical examination with abnormal findings 06/27/2022   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) 05/23/2021   Former smoker 05/05/2021   Elevated PSA 02/16/2021   Erectile dysfunction due to arterial insufficiency 05/11/2020   Benign prostatic hyperplasia with urinary obstruction 04/11/2020   DDD (degenerative disc disease), cervical 06/11/2018   DDD (degenerative disc disease), lumbar 06/11/2018   History of kidney stones 06/11/2018   Cauda equina syndrome  (HCC) 06/11/2018   Primary osteoarthritis of left knee 06/11/2018   Primary osteoarthritis of both feet 06/11/2018   Plantar fasciitis 06/11/2018   Hx of colonic polyp 05/29/2018   Left lumbar radiculopathy 01/07/2018   Diastolic dysfunction 10/15/2016   Psoriasis 10/15/2016   GERD 10/02/2007   Prolapsed internal hemorrhoids, grade 2 and 3 09/10/2006   SLEEP APNEA 09/10/2006    Past Medical History:  Diagnosis Date   Acute blood loss anemia 09/26/2022   Anemia 2019   Arachnoiditis    Arthritis    Bleeding internal hemorrhoids 09/10/2006   Qualifier: Diagnosis of  By: Erby Pian MD, Cornelius     Cancer [facility]) 2022   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HCC) 2011   Cerebellar infarct (HCC)    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   anterior cer disc and fusion with plates Q2-5 and C4-5 at [facility]   Colonic mass 09/29/2022   Constipation    Encounter for general adult medical examination with abnormal findings 06/27/2022   Enlarged prostate    GERD (gastroesophageal reflux disease)    Hemorrhoids    History of kidney stones    passed stones   History of lumbar laminectomy 05/12/2020   open L2-L5 at [facility]   Hx of colonic polyp 05/29/2018   05/2018 cecal ssp   Lung nodule 02/2021   Lyme disease    Maxillary sinus polyp    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   Pneumonia    Prolapsed internal hemorrhoids, grade 3 09/10/2006   Qualifier: Diagnosis of  By: Erby Pian MD, Cornelius     Salivary gland enlargement    Sleep apnea 10 years ago   declined CPAP   Stroke (  HCC) 15-20 years ago    Family History  Problem Relation Age of Onset   Aortic aneurysm Mother    Varicose Veins Mother    Heart attack Father    Arthritis Father    Aortic aneurysm Sister    Early death Sister    CVA Sister    Aortic aneurysm Sister    Aortic aneurysm Sister    Colon cancer Neg Hx    Pancreatic cancer Neg Hx    Rectal cancer Neg Hx    Stomach cancer Neg Hx    Esophageal cancer Neg Hx    Colon polyps Neg Hx     Past Surgical History:  Procedure Laterality Date   BIOPSY  09/28/2022   Procedure: BIOPSY;  Surgeon: Sherrilyn Rist, MD;  Location: [facility];  Service: Gastroenterology;;   BRONCHIAL BIOPSY  05/16/2021   Procedure: BRONCHIAL BIOPSIES;  Surgeon: Josephine Igo, DO;  Location: [name];  Service: Pulmonary;;   BRONCHIAL BRUSHINGS  05/16/2021   Procedure: BRONCHIAL BRUSHINGS;  Surgeon: Josephine Igo, DO;  Location: [name];  Service: Pulmonary;;   BRONCHIAL NEEDLE ASPIRATION BIOPSY  05/16/2021   Procedure: BRONCHIAL NEEDLE ASPIRATION BIOPSIES;  Surgeon: Josephine Igo, DO;  Location: [name];  Service: Pulmonary;;   CERVICAL SPINE SURGERY  04/10/2020   C3-C4 with pin at [facility]   COLONOSCOPY     COLONOSCOPY WITH PROPOFOL N/A 09/28/2022   Procedure: COLONOSCOPY WITH PROPOFOL;  Surgeon: Sherrilyn Rist, MD;  Location: [facility];  Service: Gastroenterology;  Laterality: N/A;   ESOPHAGOGASTRODUODENOSCOPY (EGD) WITH PROPOFOL N/A 09/28/2022   Procedure: ESOPHAGOGASTRODUODENOSCOPY (EGD) WITH PROPOFOL;  Surgeon: Sherrilyn Rist, MD;  Location: [facility];  Service: Gastroenterology;  Laterality: N/A;   FIDUCIAL MARKER PLACEMENT  05/16/2021   Procedure: FIDUCIAL MARKER PLACEMENT;  Surgeon: Josephine Igo, DO;  Location: [name];  Service: Pulmonary;;   HEMORRHOID BANDING     LUMBAR LAMINECTOMY  03/08/2010   L2-3, cauda equina syndrome   lumbar laminectomy  05/12/2020   [facility]   SPINE SURGERY     UPPER GI ENDOSCOPY     VIDEO BRONCHOSCOPY WITH ENDOBRONCHIAL NAVIGATION Left 05/16/2021   Procedure: VIDEO BRONCHOSCOPY WITH ENDOBRONCHIAL NAVIGATION;  Surgeon: Josephine Igo, DO;  Location: [name];  Service: Pulmonary;  Laterality: Left;  ION w/ fiducial placement   VIDEO BRONCHOSCOPY WITH RADIAL ENDOBRONCHIAL ULTRASOUND  05/16/2021   Procedure: VIDEO BRONCHOSCOPY WITH RADIAL ENDOBRONCHIAL ULTRASOUND;  Surgeon: Josephine Igo, DO;  Location: [name];   Service: Pulmonary;;   wisdom teeth ext     Social History   Social History Narrative   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   Immunization History  Administered Date(s) Administered   Fluad Quad(high Dose 65+) 04/11/2022   Influenza Split 05/18/2014   Influenza Whole 04/28/2008   Influenza, High Dose Seasonal PF 03/31/2019, 04/08/2020   Influenza,inj,Quad PF,6+ Mos 05/01/2019   Influenza,inj,quad, With Preservative 05/13/2018   Influenza-Unspecified 05/13/2018, 04/29/2021, 05/05/2022   Moderna Covid-19 Fall Seasonal Vaccine 40yrs & older 06/13/2022   Moderna SARS-COV2 Booster Vaccination 06/01/2020   Moderna Sars-Covid-2 Vaccination 08/21/2019, 09/19/2019, 06/01/2020   Pneumococcal Conjugate-13 10/09/2013   Pneumococcal-Unspecified 10/09/2013   Rsv, Bivalent, Protein Subunit Rsvpref,pf Verdis Frederickson) 06/13/2022  Td 04/28/2008   Tdap 04/28/2008, 06/22/2021   Zoster Recombinant(Shingrix) 12/21/2014, 01/15/2022   Zoster, Live 12/21/2014   Zoster, Unspecified 12/21/2014     Objective: Vital Signs: There were no vitals taken for this visit.   Physical Exam   Musculoskeletal Exam: ***  CDAI Exam: CDAI Score: -- Patient Global: --; Provider Global: -- Swollen: --; Tender: -- Joint Exam 06/05/2023   No joint exam has been documented for this visit   There is currently no information documented on the homunculus. Go to the Rheumatology activity and complete the homunculus joint exam.  Investigation: No additional findings.  Imaging: No results found.  Recent Labs: Lab Results  Component Value Date   WBC 5.5 01/14/2023   HGB 9.6 (L) 01/14/2023   PLT 348 01/14/2023   NA 138 01/14/2023   K 4.4 01/14/2023   CL 103 01/14/2023   CO2 23 01/14/2023   GLUCOSE 91 01/14/2023   BUN 13  01/14/2023   CREATININE 0.71 (L) 01/14/2023   BILITOT 0.8 09/29/2022   ALKPHOS 56 09/29/2022   AST 16 09/29/2022   ALT 13 09/29/2022   PROT 6.3 (L) 09/29/2022   ALBUMIN 3.6 09/29/2022   CALCIUM 9.3 01/14/2023   GFRAA >60 06/13/2018    Speciality Comments: No specialty comments available.  Procedures:  No procedures performed Allergies: Patient has no known allergies.   Assessment / Plan:     Visit Diagnoses: Positive ANA (antinuclear antibody)  Primary osteoarthritis of both hands  Primary osteoarthritis of left knee  Primary osteoarthritis of both feet  DDD (degenerative disc disease), cervical  Degeneration of intervertebral disc of lumbar region without discogenic back pain or lower extremity pain  History of colon cancer  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)  Former smoker  HYPERTENSION, BENIGN ESSENTIAL  Diastolic dysfunction  NCGS (non-celiac gluten sensitivity)  Cerebellar infarct (HCC)  Hx of colonic polyp  Rosacea  History of kidney stones  Orders: No orders of the defined types were placed in this encounter.  No orders of the defined types were placed in this encounter.   Face-to-face time spent with patient was *** minutes. Greater than 50% of time was spent in counseling and coordination of care.  Follow-Up Instructions: No follow-ups on file.   Gearldine Bienenstock, PA-C  Note - This record has been created using Dragon software.  Chart creation errors have been sought, but may not always  have been located. Such creation errors do not reflect on  the standard of medical care.

## 2023-05-31 ENCOUNTER — Other Ambulatory Visit: Payer: Self-pay

## 2023-05-31 MED ORDER — NITROGLYCERIN 0.4 % RE OINT: 1.0000 g | TOPICAL_OINTMENT | RECTAL | 4 refills | Status: DC | PRN

## 2023-05-31 NOTE — Progress Notes (Signed)
Pharmacy cannot compound Nitroglycerin 0.4% ointment they are requesting 0.2% ointment order, that is what they can compound.  Verbal from Dr. Ronne Binning to give verbal order for 0.2 % ointment.  Pharmacy notified of updated rx.

## 2023-06-03 ENCOUNTER — Encounter: Payer: Medicare PPO | Attending: Physical Medicine and Rehabilitation | Admitting: Physical Medicine and Rehabilitation

## 2023-06-03 ENCOUNTER — Encounter: Payer: Self-pay | Admitting: Physical Medicine and Rehabilitation

## 2023-06-03 ENCOUNTER — Encounter: Payer: Self-pay | Admitting: Rheumatology

## 2023-06-03 VITALS — BP 119/74 | HR 67 | Ht 72.0 in | Wt 194.0 lb

## 2023-06-03 DIAGNOSIS — R269 Unspecified abnormalities of gait and mobility: Secondary | ICD-10-CM | POA: Diagnosis not present

## 2023-06-03 DIAGNOSIS — M6281 Muscle weakness (generalized): Secondary | ICD-10-CM | POA: Diagnosis not present

## 2023-06-03 DIAGNOSIS — M792 Neuralgia and neuritis, unspecified: Secondary | ICD-10-CM | POA: Insufficient documentation

## 2023-06-03 DIAGNOSIS — G834 Cauda equina syndrome: Secondary | ICD-10-CM | POA: Diagnosis not present

## 2023-06-03 NOTE — Telephone Encounter (Signed)
Ok to reschedule appointment.

## 2023-06-03 NOTE — Patient Instructions (Addendum)
  Pt is a 77 yr old male with hx of lung CA- s/p L lower lobectomy and cauda equina with saddle anesthesia, neurogenic bladder; no DM; has HTN; Also has Colon CA- surgery 11/08/22- for partial colectomy-    Here for f/u for Cauda equina syndrome  Can use back brace- but not more than 4 hours/day.   Can exacerbate- back/leg/hip pain with lifting; esp if doesn't use proper  2. If on knees, DON'T lift  3. 10-15 minutes of balance exercises per day- 5-7 days/week.    4. Use exercise ball- to work on core torso strength - 5 days/week minimum. To work on core strength. Knees at 90 degrees for you !    5.   Will reach out to Dr Dawley to see about arachnoiditis/cauda equina.    6. We discussed life expectancy- and functional status- with keeping active.    7. We discussed don't use it; will lose it- and can gain it if works on it.    8.  F/U in 4 months- double appt-  SCI/cauda equina syndrome

## 2023-06-03 NOTE — Progress Notes (Signed)
Subjective:    Patient ID: Russell [name], male    DOB: May 22, 1946, 77 y.o.   MRN: [id number]  HPI Pt is a 77 yr old male with hx of lung CA- s/p L lower lobectomy and cauda equina with saddle anesthesia, neurogenic bladder; no DM; has HTN;    Here for f/u for Cauda equina syndrome  Less able to walk normally than when saw me by telehealth- 6 months ago.   Dr Andrey Campanile did surgery- and had developed partial saddle anesthesia.   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  Concern about debilitating pain- that occur-  2 serious attacks since 2021- that lasted months long- if tries to move LLE- and only "Normal"- in AM- and rest of day, would be excruciating pain.  Learned to lay on stomach which alleviated pain- and use msaage gun to loosen muscles and TENS unit all night- wireless TENS unit-   Now by evening, low back aches and walks more awkwardly. And just needs to get off feet- and goes to bed 9-9:30 pm.  Balance is off when gets up and gets better as moved.   Can walk 1 mile with single pain cane/hiking stick- and walks dog/2x/day.   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   When haves nerve pain; lasts ~ 10 minutes at a time.  Has notices condition/pain gets worse if misses gabapentin   Last major episode was 3 months ago- lasted 6 months.   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   Pain Inventory Average Pain 2 Pain Right Now 1 My pain is intermittent and aching  In the last 24 hours, has pain interfered with the following? General activity 3 Relation with others 0 Enjoyment of life 4 What TIME of day is your pain at its worst? evening and night Sleep (in general)  Fair  Pain is worse with: bending and some activites Pain improves with: rest and stretching Relief from Meds: 8 with Gabapentin   Family History  Problem Relation Age of Onset   Aortic aneurysm Mother    Varicose Veins Mother    Heart attack Father    Arthritis Father    Aortic aneurysm Sister    Early death Sister    CVA Sister    Aortic aneurysm Sister    Aortic aneurysm Sister    Colon cancer Neg Hx    Pancreatic cancer Neg Hx    Rectal cancer Neg Hx    Stomach cancer Neg Hx    Esophageal cancer Neg Hx    Colon polyps Neg Hx    Social History   Socioeconomic History   Marital status: Married    Spouse name: Not on file   Number of children: 0   Years of education: Not on file   Highest education level: Master's degree (e.g., MA, MS, MEng, MEd, MSW, MBA)  Occupational History   Occupation: retired  Tobacco Use   Smoking status: Former    Current packs/day: 0.00    Average packs/day: 0.3 packs/day for 9.0 years (2.3 ttl pk-yrs)    Types: Cigarettes    Start date: 10/15/1965    Quit date: 10/16/1974  Years since quitting: 48.6    Passive exposure: Past   Smokeless tobacco: Never  Vaping Use   Vaping status: Never Used  Substance and Sexual Activity   Alcohol use: Not Currently   Drug use: No   Sexual activity: Not Currently  Other Topics Concern   Not on file  Social History Narrative   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   Social Determinants of Health   Financial Resource Strain: Low Risk  (04/04/2023)   Overall Financial Resource Strain (CARDIA)    Difficulty of Paying Living Expenses: Not hard at all  Food Insecurity: No Food Insecurity (04/04/2023)   Hunger Vital Sign    Worried About Running Out of Food in the Last Year: Never true    Ran Out of Food in the Last Year:  Never true  Transportation Needs: No Transportation Needs (04/04/2023)   PRAPARE - Administrator, Civil Service (Medical): No    Lack of Transportation (Non-Medical): No  Physical Activity: Insufficiently Active (04/04/2023)   Exercise Vital Sign    Days of Exercise per Week: 3 days    Minutes of Exercise per Session: 30 min  Stress: No Stress Concern Present (04/04/2023)   [employer] of Occupational Health - Occupational Stress Questionnaire    Feeling of Stress : Not at all  Social Connections: Moderately Isolated (04/04/2023)   Social Connection and Isolation Panel [NHANES]    Frequency of Communication with Friends and Family: More than three times a week    Frequency of Social Gatherings with Friends and Family: More than three times a week    Attends Religious Services: Never    Database administrator or Organizations: No    Attends Banker Meetings: Never    Marital Status: Married   Past Surgical History:  Procedure Laterality Date   BIOPSY  09/28/2022   Procedure: BIOPSY;  Surgeon: Sherrilyn Rist, MD;  Location: [facility];  Service: Gastroenterology;;   BRONCHIAL BIOPSY  05/16/2021   Procedure: BRONCHIAL BIOPSIES;  Surgeon: Josephine Igo, DO;  Location: [name];  Service: Pulmonary;;   BRONCHIAL BRUSHINGS  05/16/2021   Procedure: BRONCHIAL BRUSHINGS;  Surgeon: Josephine Igo, DO;  Location: [name];  Service: Pulmonary;;   BRONCHIAL NEEDLE ASPIRATION BIOPSY  05/16/2021   Procedure: BRONCHIAL NEEDLE ASPIRATION BIOPSIES;  Surgeon: Josephine Igo, DO;  Location: [name];  Service: Pulmonary;;   CERVICAL SPINE SURGERY  04/10/2020   C3-C4 with pin at [facility]   COLONOSCOPY     COLONOSCOPY WITH PROPOFOL N/A 09/28/2022   Procedure: COLONOSCOPY WITH PROPOFOL;  Surgeon: Sherrilyn Rist, MD;  Location: [facility];  Service: Gastroenterology;  Laterality: N/A;   ESOPHAGOGASTRODUODENOSCOPY (EGD) WITH PROPOFOL N/A 09/28/2022    Procedure: ESOPHAGOGASTRODUODENOSCOPY (EGD) WITH PROPOFOL;  Surgeon: Sherrilyn Rist, MD;  Location: [facility];  Service: Gastroenterology;  Laterality: N/A;   FIDUCIAL MARKER PLACEMENT  05/16/2021   Procedure: FIDUCIAL MARKER PLACEMENT;  Surgeon: Josephine Igo, DO;  Location: [name];  Service: Pulmonary;;   HEMORRHOID BANDING     LUMBAR LAMINECTOMY  03/08/2010   L2-3, cauda equina syndrome   lumbar laminectomy  05/12/2020   [facility]   SPINE SURGERY  UPPER GI ENDOSCOPY     VIDEO BRONCHOSCOPY WITH ENDOBRONCHIAL NAVIGATION Left 05/16/2021   Procedure: VIDEO BRONCHOSCOPY WITH ENDOBRONCHIAL NAVIGATION;  Surgeon: Josephine Igo, DO;  Location: [name];  Service: Pulmonary;  Laterality: Left;  ION w/ fiducial placement   VIDEO BRONCHOSCOPY WITH RADIAL ENDOBRONCHIAL ULTRASOUND  05/16/2021   Procedure: VIDEO BRONCHOSCOPY WITH RADIAL ENDOBRONCHIAL ULTRASOUND;  Surgeon: Josephine Igo, DO;  Location: [name];  Service: Pulmonary;;   wisdom teeth ext     Past Surgical History:  Procedure Laterality Date   BIOPSY  09/28/2022   Procedure: BIOPSY;  Surgeon: Sherrilyn Rist, MD;  Location: [facility];  Service: Gastroenterology;;   BRONCHIAL BIOPSY  05/16/2021   Procedure: BRONCHIAL BIOPSIES;  Surgeon: Josephine Igo, DO;  [facility]: [name];  Service: Pulmonary;;   BRONCHIAL BRUSHINGS  05/16/2021   Procedure: BRONCHIAL BRUSHINGS;  Surgeon: Josephine Igo, DO;  [facility]: [name];  Service: Pulmonary;;   BRONCHIAL NEEDLE ASPIRATION BIOPSY  05/16/2021   Procedure: BRONCHIAL NEEDLE ASPIRATION BIOPSIES;  Surgeon: Josephine Igo, DO;  Location: [name];  Service: Pulmonary;;   CERVICAL SPINE SURGERY  04/10/2020   C3-C4 with pin at [facility]   COLONOSCOPY     COLONOSCOPY WITH PROPOFOL N/A 09/28/2022   Procedure: COLONOSCOPY WITH PROPOFOL;  Surgeon: Sherrilyn Rist, MD;  Location: [facility];  Service: Gastroenterology;  Laterality: N/A;    ESOPHAGOGASTRODUODENOSCOPY (EGD) WITH PROPOFOL N/A 09/28/2022   Procedure: ESOPHAGOGASTRODUODENOSCOPY (EGD) WITH PROPOFOL;  Surgeon: Sherrilyn Rist, MD;  Location: [facility];  Service: Gastroenterology;  Laterality: N/A;   FIDUCIAL MARKER PLACEMENT  05/16/2021   Procedure: FIDUCIAL MARKER PLACEMENT;  Surgeon: Josephine Igo, DO;  Location: [name];  Service: Pulmonary;;   HEMORRHOID BANDING     LUMBAR LAMINECTOMY  03/08/2010   L2-3, cauda equina syndrome   lumbar laminectomy  05/12/2020   [facility]   SPINE SURGERY     UPPER GI ENDOSCOPY     VIDEO BRONCHOSCOPY WITH ENDOBRONCHIAL NAVIGATION Left 05/16/2021   Procedure: VIDEO BRONCHOSCOPY WITH ENDOBRONCHIAL NAVIGATION;  Surgeon: Josephine Igo, DO;  Location: [name];  Service: Pulmonary;  Laterality: Left;  ION w/ fiducial placement   VIDEO BRONCHOSCOPY WITH RADIAL ENDOBRONCHIAL ULTRASOUND  05/16/2021   Procedure: VIDEO BRONCHOSCOPY WITH RADIAL ENDOBRONCHIAL ULTRASOUND;  Surgeon: Josephine Igo, DO;  Location: [name];  Service: Pulmonary;;   wisdom teeth ext     Past Medical History:  Diagnosis Date   Acute blood loss anemia 09/26/2022   Anemia 2019   Arachnoiditis    Arthritis    Bleeding internal hemorrhoids 09/10/2006   Qualifier: Diagnosis of  By: Erby Pian MD, Cornelius     Cancer [facility]) 2022   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HCC) 2011   Cerebellar infarct (HCC)    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   anterior cer disc and fusion with plates Z6-1 and C4-5 at [facility]   Colonic mass 09/29/2022   Constipation    Encounter for general adult medical examination with abnormal findings 06/27/2022   Enlarged prostate    GERD (gastroesophageal reflux disease)    Hemorrhoids    History of kidney stones    passed stones   History of lumbar laminectomy 05/12/2020   open L2-L5 at [facility]   Hx of colonic polyp 05/29/2018   05/2018 cecal ssp   Lung nodule 02/2021   Lyme disease    Maxillary sinus polyp    Memory loss     related to age  Pneumonia    Prolapsed internal hemorrhoids, grade 3 09/10/2006   Qualifier: Diagnosis of  By: Erby Pian MD, Cornelius     Salivary gland enlargement    Sleep apnea 10 years ago   declined CPAP   Stroke (HCC) 15-20 years ago   BP 119/74   Pulse 67   Ht 6' (1.829 m)   Wt 194 lb (88 kg)   SpO2 97%   BMI 26.31 kg/m   Opioid Risk Score:   Fall Risk Score:  `1  Depression screen [facility] 2/9     06/03/2023   12:59 PM 04/04/2023    8:10 AM 01/14/2023    9:11 AM 11/30/2022    9:30 AM 09/26/2022    2:49 PM 08/31/2022    9:10 AM 07/19/2022    1:24 PM  Depression screen PHQ 2/9  Decreased Interest 1 0 0 0 0 2 1  Down, Depressed, Hopeless 1 0 0 0 0 1 0  PHQ - 2 Score 2 0 0 0 0 3 1  Altered sleeping      0   Tired, decreased energy      3   Change in appetite      0   Feeling bad or failure about yourself       0   Trouble concentrating      0   Moving slowly or fidgety/restless      0   Suicidal thoughts      0   PHQ-9 Score      6     Review of Systems  Musculoskeletal:  Positive for gait problem.       Pain from left buttock down to back of left knee  All other systems reviewed and are negative.      Objective:   Physical Exam  Awake, alert, appropriate, using Single point cane, NAD MSK: Ue's 5/5 in Deltoids, biceps, triceps, WE 5/5- grip 4+ 5 and FA 4-/5  Joint contractures in hands- and swelling  LE's-  RLE- HF 5-/5; KE 5-/5; DF 3-/5; and PF 3+/5 LLE- HF 5/5; KE 5-/5; DF 4-/5 and PF 4-/5   R medial malleolus swelling/from contracture    Assessment & Plan:    Pt is a 77 yr old male with hx of lung CA- s/p L lower lobectomy and cauda equina with saddle anesthesia, neurogenic bladder; no DM; has HTN; Also has Colon CA- surgery 11/08/22- for partial colectomy- B/L foot drop.    Here for f/u for Cauda equina syndrome  Can use back brace- but not more than 4 hours/day.   Can exacerbate- back/leg/hip pain with lifting; esp if doesn't use proper  2. If  on knees, DON'T lift  3. 10-15 minutes of balance exercises per day- 5-7 days/week.    4. Use exercise ball- to work on core torso strength - 5 days/week minimum. To work on core strength.    5.   Will reach out to Dr Dawley to see about arachnoiditis/cauda equina.    6. We discussed life expectancy- and functional status- with keeping active.    7. We discussed don't use it; will lose it- and can gain it if works on it.    8.  F/U in 4 months- double appt-  SCI/cauda equina syndrome.    I spent a total of 36   minutes on total care today- >50% coordination of care- due to answering questions- about cauda equina vs arachnoiditis-

## 2023-06-05 ENCOUNTER — Ambulatory Visit: Payer: Medicare PPO | Admitting: Physician Assistant

## 2023-06-05 DIAGNOSIS — K9041 Non-celiac gluten sensitivity: Secondary | ICD-10-CM

## 2023-06-05 DIAGNOSIS — M19072 Primary osteoarthritis, left ankle and foot: Secondary | ICD-10-CM

## 2023-06-05 DIAGNOSIS — Z860100 Personal history of colon polyps, unspecified: Secondary | ICD-10-CM

## 2023-06-05 DIAGNOSIS — I5189 Other ill-defined heart diseases: Secondary | ICD-10-CM

## 2023-06-05 DIAGNOSIS — C3432 Malignant neoplasm of lower lobe, left bronchus or lung: Secondary | ICD-10-CM

## 2023-06-05 DIAGNOSIS — L719 Rosacea, unspecified: Secondary | ICD-10-CM

## 2023-06-05 DIAGNOSIS — M51369 Other intervertebral disc degeneration, lumbar region without mention of lumbar back pain or lower extremity pain: Secondary | ICD-10-CM

## 2023-06-05 DIAGNOSIS — Z87891 Personal history of nicotine dependence: Secondary | ICD-10-CM

## 2023-06-05 DIAGNOSIS — M1712 Unilateral primary osteoarthritis, left knee: Secondary | ICD-10-CM

## 2023-06-05 DIAGNOSIS — I10 Essential (primary) hypertension: Secondary | ICD-10-CM

## 2023-06-05 DIAGNOSIS — Z85038 Personal history of other malignant neoplasm of large intestine: Secondary | ICD-10-CM

## 2023-06-05 DIAGNOSIS — Z87442 Personal history of urinary calculi: Secondary | ICD-10-CM

## 2023-06-05 DIAGNOSIS — M5030 Other cervical disc degeneration, unspecified cervical region: Secondary | ICD-10-CM

## 2023-06-05 DIAGNOSIS — R768 Other specified abnormal immunological findings in serum: Secondary | ICD-10-CM

## 2023-06-05 DIAGNOSIS — I639 Cerebral infarction, unspecified: Secondary | ICD-10-CM

## 2023-06-05 DIAGNOSIS — M19041 Primary osteoarthritis, right hand: Secondary | ICD-10-CM

## 2023-06-06 DIAGNOSIS — H43393 Other vitreous opacities, bilateral: Secondary | ICD-10-CM | POA: Diagnosis not present

## 2023-06-14 NOTE — Progress Notes (Signed)
Virtual Visit via Video Note  I connected with Russell [name] on 06/14/23 at  3:20 PM EST by a video enabled telemedicine application and verified that I am speaking with the correct person using two identifiers.  Location: Patient: Home Provider: Office   I discussed the limitations of evaluation and management by telemedicine and the availability of in person appointments. The patient expressed understanding and agreed to proceed.  History of Present Illness:  Russell [name] is a 77 y.o. 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  He was started on colchicine 0.6 mg 1 tablet p.o. daily on March 13, 2023.  Patient states that he took it for 1 month and did not notice any difference in his symptoms.  He states that he does well on natural anti-inflammatories.  He continues to have discomfort from coryzal Aquinas.  He states he has constant discomfort in the gluteal region.  He was placed on gabapentin by Dr. Berline Chough.  He has been experiencing instability in his gait.  He is uncertain if it is due to gabapentin or his cauda equina getting worse.  He plans to discuss this further with Dr. Berline Chough.  He continues to have some stiffness in his hands but no joint swelling.  He has intermittent discomfort in his left knee joint.  He states he has flatfeet and callus formation in his feet which is bothersome.  He goes to the chiropractor for neck pain.  He denies any nocturnal pain in his back currently.  He has intermittent pain with activities in his back.  He has been under a lot of stress due to his wife's health A virtual visit was performed today as patient's wife is in hospice who has ovarian cancer.   Patient reports morning stiffness for 30  minutes.   Patient denies nocturnal pain.  Difficulty dressing/grooming: Denies Difficulty climbing stairs: Denies Difficulty getting out of chair: Denies Difficulty using hands for taps, buttons, cutlery, and/or writing:  Denies   Review of Systems  Constitutional:  Positive for malaise/fatigue.  HENT:  Negative for congestion.   Eyes:  Negative for pain.  Respiratory:  Negative for cough.   Cardiovascular:  Negative for chest pain and palpitations.  Gastrointestinal:  Negative for constipation and diarrhea.  Genitourinary:  Positive for frequency.  Musculoskeletal:  Positive for back pain and myalgias.  Skin:  Negative for rash.  Neurological:  Negative for headaches.  Endo/Heme/Allergies:  Negative for environmental allergies.  Psychiatric/Behavioral:  Positive for depression. The patient is nervous/anxious. The patient does not have insomnia.     Observations/Objective: Physical Exam Constitutional:      Appearance: Normal appearance.  Neurological:     Mental Status: He is alert.  Psychiatric:        Mood and Affect: Mood normal.        Behavior: Behavior normal.      Assessment and Plan: Visit Diagnoses: Positive ANA (antinuclear antibody) - he has positive ANA.  ENA is completely negative.  He denies history of oral ulcers, nasal ulcers, malar rash, photosensitivity, Raynaud's, lymphadenopathy or oral ulcers.  He denies any history of inflammatory arthritis.  I advised him to contact me if he develops any new symptoms.   Primary osteoarthritis of both hands -he states his symptoms are manageable with over-the-counter natural anti-inflammatories.  He tried colchicine for a month and did not notice any difference.  Joint protection muscle strengthening was discussed.   Primary osteoarthritis of left knee-he reports intermittent discomfort.  He denies any swelling.   Primary osteoarthritis of both feet -he states he has bilateral flatfeet and callus formation which causes discomfort.  He has severe arthritis right ankle joint.  He states he has seen orthopedic surgeon at [facility] and will require right ankle joint surgery.   DDD (degenerative disc disease), cervical -he has intermittent discomfort  with radiculopathy.  Patient states he goes to [facility].  He had C-spine fusion in September 2021 by Dr. Andrey Campanile at [facility] for [place].   DDD (degenerative disc disease), lumbar -he has been going to Dr. Berline Chough.  Patient states he was given gabapentin which helps with the back pain but he is uncertain if it is causing unstable gait.  He will discuss it further with Dr. Berline Chough.  He had lumbar laminectomy by Dr. Andrey Campanile at [facility] in October 2021.  Patient states he was diagnosed with cauda equina syndrome by Dr. Franky Macho.    History of colon cancer - dxd 03/24, s/p partial colectomy 04/24.   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  No chemotherapy or radiation therapy required.   Former smoker - He smoked  1 pack/day for 9 years.  He quit smoking in 1976.   [name] has been under a lot of stress because his wife has terminal ovarian cancer and she is in hospice currently.   HYPERTENSION, BENIGN ESSENTIAL-patient was advised to monitor blood pressure closely.   Other medical problems are listed as follows:   Diastolic dysfunction   NCGS (non-celiac gluten sensitivity)   Cerebellar infarct (HCC)   Hx of colonic polyp   Rosacea - he is followed by dermatology.   History of kidney stones  Follow Up Instructions:    I discussed the assessment and treatment plan with the patient. The patient was provided an opportunity to ask questions and all were answered. The patient agreed with the plan and demonstrated an understanding of the instructions.   The patient was advised to call back or seek an in-person evaluation if the symptoms worsen or if the condition fails to improve as anticipated.  I provided 20 minutes of non-face-to-face time during this encounter.   Pollyann Savoy, MD

## 2023-06-17 DIAGNOSIS — M546 Pain in thoracic spine: Secondary | ICD-10-CM | POA: Diagnosis not present

## 2023-06-17 DIAGNOSIS — M9902 Segmental and somatic dysfunction of thoracic region: Secondary | ICD-10-CM | POA: Diagnosis not present

## 2023-06-17 DIAGNOSIS — M542 Cervicalgia: Secondary | ICD-10-CM | POA: Diagnosis not present

## 2023-06-17 DIAGNOSIS — M9901 Segmental and somatic dysfunction of cervical region: Secondary | ICD-10-CM | POA: Diagnosis not present

## 2023-06-27 ENCOUNTER — Encounter: Payer: Self-pay | Admitting: Rheumatology

## 2023-06-27 ENCOUNTER — Ambulatory Visit: Payer: Medicare PPO | Attending: Rheumatology | Admitting: Rheumatology

## 2023-06-27 VITALS — Ht 72.0 in

## 2023-06-27 DIAGNOSIS — M1712 Unilateral primary osteoarthritis, left knee: Secondary | ICD-10-CM | POA: Diagnosis not present

## 2023-06-27 DIAGNOSIS — Z85038 Personal history of other malignant neoplasm of large intestine: Secondary | ICD-10-CM

## 2023-06-27 DIAGNOSIS — M19042 Primary osteoarthritis, left hand: Secondary | ICD-10-CM

## 2023-06-27 DIAGNOSIS — R768 Other specified abnormal immunological findings in serum: Secondary | ICD-10-CM | POA: Diagnosis not present

## 2023-06-27 DIAGNOSIS — C3432 Malignant neoplasm of lower lobe, left bronchus or lung: Secondary | ICD-10-CM | POA: Diagnosis not present

## 2023-06-27 DIAGNOSIS — M19041 Primary osteoarthritis, right hand: Secondary | ICD-10-CM | POA: Diagnosis not present

## 2023-06-27 DIAGNOSIS — Z87891 Personal history of nicotine dependence: Secondary | ICD-10-CM

## 2023-06-27 DIAGNOSIS — M51369 Other intervertebral disc degeneration, lumbar region without mention of lumbar back pain or lower extremity pain: Secondary | ICD-10-CM

## 2023-06-27 DIAGNOSIS — M5030 Other cervical disc degeneration, unspecified cervical region: Secondary | ICD-10-CM

## 2023-06-27 DIAGNOSIS — M19071 Primary osteoarthritis, right ankle and foot: Secondary | ICD-10-CM | POA: Diagnosis not present

## 2023-06-27 DIAGNOSIS — M19072 Primary osteoarthritis, left ankle and foot: Secondary | ICD-10-CM

## 2023-06-27 DIAGNOSIS — F439 Reaction to severe stress, unspecified: Secondary | ICD-10-CM

## 2023-06-28 ENCOUNTER — Encounter: Payer: Self-pay | Admitting: Physical Medicine and Rehabilitation

## 2023-07-02 MED ORDER — PREDNISONE 10 MG PO TABS: ORAL_TABLET | ORAL | 0 refills | Status: DC

## 2023-07-02 NOTE — Addendum Note (Signed)
Addended by: Genice Rouge on: 07/02/2023 01:07 PM   Modules accepted: Orders

## 2023-07-11 ENCOUNTER — Encounter: Payer: Self-pay | Admitting: Physical Medicine and Rehabilitation

## 2023-07-19 ENCOUNTER — Encounter: Payer: Self-pay | Admitting: Internal Medicine

## 2023-07-19 ENCOUNTER — Ambulatory Visit (INDEPENDENT_AMBULATORY_CARE_PROVIDER_SITE_OTHER): Payer: Medicare PPO | Admitting: Internal Medicine

## 2023-07-19 VITALS — BP 122/76 | HR 81 | Ht 72.0 in | Wt 194.8 lb

## 2023-07-19 DIAGNOSIS — E559 Vitamin D deficiency, unspecified: Secondary | ICD-10-CM | POA: Diagnosis not present

## 2023-07-19 DIAGNOSIS — N401 Enlarged prostate with lower urinary tract symptoms: Secondary | ICD-10-CM

## 2023-07-19 DIAGNOSIS — Z9049 Acquired absence of other specified parts of digestive tract: Secondary | ICD-10-CM

## 2023-07-19 DIAGNOSIS — G039 Meningitis, unspecified: Secondary | ICD-10-CM

## 2023-07-19 DIAGNOSIS — R269 Unspecified abnormalities of gait and mobility: Secondary | ICD-10-CM | POA: Diagnosis not present

## 2023-07-19 DIAGNOSIS — Z0001 Encounter for general adult medical examination with abnormal findings: Secondary | ICD-10-CM

## 2023-07-19 DIAGNOSIS — C3432 Malignant neoplasm of lower lobe, left bronchus or lung: Secondary | ICD-10-CM

## 2023-07-19 DIAGNOSIS — N138 Other obstructive and reflux uropathy: Secondary | ICD-10-CM

## 2023-07-19 DIAGNOSIS — D500 Iron deficiency anemia secondary to blood loss (chronic): Secondary | ICD-10-CM

## 2023-07-19 DIAGNOSIS — E782 Mixed hyperlipidemia: Secondary | ICD-10-CM

## 2023-07-19 DIAGNOSIS — R002 Palpitations: Secondary | ICD-10-CM | POA: Diagnosis not present

## 2023-07-19 HISTORY — DX: Mixed hyperlipidemia: E78.2

## 2023-07-19 NOTE — Patient Instructions (Addendum)
 Please try taking Silodosin once daily instead of twice daily.  Please continue to take medications as prescribed.  Please continue to follow heart healthy diet and ambulate as tolerated.

## 2023-07-19 NOTE — Assessment & Plan Note (Signed)
 Likely from lumbar radiculopathy Also has lightheadedness, could be due to silodosin - decreased dose Maintain adequate hydration and eat at regular intervals Check CBC, CMP, TSH Has had PT in the past

## 2023-07-19 NOTE — Assessment & Plan Note (Signed)
 Physical exam as documented. Fasting blood tests ordered. Has had flu and pneumococcal vaccines at local pharmacy.

## 2023-07-19 NOTE — Assessment & Plan Note (Signed)
 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

## 2023-07-19 NOTE — Assessment & Plan Note (Signed)
For history of colon cancer Currently denies melena or hematochezia

## 2023-07-19 NOTE — Assessment & Plan Note (Signed)
Likely due to GI bleeding S/p right hemicolectomy for colon cancer On iron supplement Check CBC

## 2023-07-19 NOTE — Assessment & Plan Note (Signed)
Former smoker Followed by Pulmonology and Oncology S/p lobectomy at [facility] 

## 2023-07-19 NOTE — Assessment & Plan Note (Signed)
 Well controlled with metoprolol Followed by cardiology

## 2023-07-19 NOTE — Progress Notes (Signed)
 Established Patient Office Visit  Subjective:  Patient ID: Russell [name], male    DOB: 1946/02/12  Age: 78 y.o. MRN: [id number]  CC:  Chief Complaint  Patient presents with   Annual Exam   Dizziness    Patient has been experiencing light headedness more often     HPI Russell [name] is a 78 y.o. male with past medical history of DDD of cervical and lumbar spine, cauda equina syndrome, GERD, lung cancer and BPH who presents for annual physical.  He reports episodes of lightheadedness, worse with position changes.  Of note, he takes silodosin  8 mg twice daily for BPH currently and agrees to cut down dose to QD.  He has history of DDD of cervical and lumbar spine.  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  He has seen Dr. Claudene at [facility] spine [facility] pain management clinic. He is seeing Dr Cornelio currently. He has worsening of his symptoms with bending, especially while tying his shoelaces.  His symptoms are worse in the evening.  He has been treated with steroids, gabapentin  and muscle relaxers in the past. He follows up with PM&R now and has been trying to taper Gabapentin  as it has not been helping with him.   He had right hemicolectomy for colon cancer in 04/24.  He had C. Diff colitis in 05/24, which was treated with oral vancomycin .  He reports resolution of diarrhea now.  Denies any fever or chills.  His Hb had dropped to 6.8 and last CBC in 07/24 showed Hb of 9.6.  He denies any melena or hematochezia currently.  He sees Dr. Kara for history of left lower lobe lung CA and has had resection of left lower lobe with Dr. Elmo at [facility].  He denies any chest pain, dyspnea or wheezing currently.    Past Medical History:  Diagnosis Date   Acute blood loss anemia 09/26/2022   Anemia 2019   Arachnoiditis    Arthritis    Bleeding internal hemorrhoids 09/10/2006   Qualifier: Diagnosis of  By: Karren MD, Cornelius      Cancer [facility]) 2022   Cauda equina syndrome [facility]) 2011   Cerebellar infarct [facility])    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   anterior cer disc and fusion with plates R6-5 and C4-5 at [facility]   Colonic mass 09/29/2022   Constipation    Encounter for general adult medical examination with abnormal findings 06/27/2022   Enlarged prostate    GERD (gastroesophageal reflux disease)    Hemorrhoids    History of kidney stones    passed stones   History of lumbar laminectomy 05/12/2020   open L2-L5 at [facility]   Hx of colonic polyp 05/29/2018   05/2018 cecal ssp   Lung nodule 02/2021   Lyme disease    Maxillary sinus polyp    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   Mixed hyperlipidemia 07/19/2023   Pneumonia    Prolapsed internal hemorrhoids, grade 3 09/10/2006   Qualifier: Diagnosis of  By: Karren MD, Cornelius     Salivary gland enlargement    Sleep apnea 10 years ago   declined CPAP   Stroke (HCC) 15-20 years ago    Past Surgical History:  Procedure Laterality Date   BIOPSY  09/28/2022   Procedure: BIOPSY;  Surgeon: Legrand Victory LITTIE DOUGLAS, MD;  Location: [facility];  Service: Gastroenterology;;   BRONCHIAL BIOPSY  05/16/2021  Procedure: BRONCHIAL BIOPSIES;  Surgeon: Brenna Adine CROME, DO;  Location: [name];  Service: Pulmonary;;   BRONCHIAL BRUSHINGS  05/16/2021   Procedure: BRONCHIAL BRUSHINGS;  Surgeon: Brenna Adine CROME, DO;  Location: [name];  Service: Pulmonary;;   BRONCHIAL NEEDLE ASPIRATION BIOPSY  05/16/2021   Procedure: BRONCHIAL NEEDLE ASPIRATION BIOPSIES;  Surgeon: Brenna Adine CROME, DO;  Location: [name];  Service: Pulmonary;;   CERVICAL SPINE SURGERY  04/10/2020   C3-C4 with pin at [facility]   COLONOSCOPY     COLONOSCOPY WITH PROPOFOL  N/A 09/28/2022   Procedure: COLONOSCOPY WITH PROPOFOL ;  Surgeon: Legrand Victory CROME DOUGLAS, MD;  Location: [facility];  Service: Gastroenterology;  Laterality: N/A;   ESOPHAGOGASTRODUODENOSCOPY (EGD) WITH PROPOFOL  N/A 09/28/2022   Procedure:  ESOPHAGOGASTRODUODENOSCOPY (EGD) WITH PROPOFOL ;  Surgeon: Legrand Victory CROME DOUGLAS, MD;  Location: [facility];  Service: Gastroenterology;  Laterality: N/A;   FIDUCIAL MARKER PLACEMENT  05/16/2021   Procedure: FIDUCIAL MARKER PLACEMENT;  Surgeon: Brenna Adine CROME, DO;  Location: [name];  Service: Pulmonary;;   HEMORRHOID BANDING     LUMBAR LAMINECTOMY  03/08/2010   L2-3, cauda equina syndrome   lumbar laminectomy  05/12/2020   [facility]   SPINE SURGERY     UPPER GI ENDOSCOPY     VIDEO BRONCHOSCOPY WITH ENDOBRONCHIAL NAVIGATION Left 05/16/2021   Procedure: VIDEO BRONCHOSCOPY WITH ENDOBRONCHIAL NAVIGATION;  Surgeon: Brenna Adine CROME, DO;  Location: [name];  Service: Pulmonary;  Laterality: Left;  ION w/ fiducial placement   VIDEO BRONCHOSCOPY WITH RADIAL ENDOBRONCHIAL ULTRASOUND  05/16/2021   Procedure: VIDEO BRONCHOSCOPY WITH RADIAL ENDOBRONCHIAL ULTRASOUND;  Surgeon: Brenna Adine CROME, DO;  Location: [name];  Service: Pulmonary;;   wisdom teeth ext      Family History  Problem Relation Age of Onset   Aortic aneurysm Mother    Varicose Veins Mother    Heart attack Father    Arthritis Father    Aortic aneurysm Sister    Early death Sister    CVA Sister    Aortic aneurysm Sister    Aortic aneurysm Sister    Colon cancer Neg Hx    Pancreatic cancer Neg Hx    Rectal cancer Neg Hx    Stomach cancer Neg Hx    Esophageal cancer Neg Hx    Colon polyps Neg Hx     Social History   Socioeconomic History   Marital status: Married    Spouse name: Not on file   Number of children: 0   Years of education: Not on file   Highest education level: Master's degree (e.g., MA, MS, MEng, MEd, MSW, MBA)  Occupational History   Occupation: retired  Tobacco Use   Smoking status: Former    Current packs/day: 0.00    Average packs/day: 0.3 packs/day for 9.0 years (2.3 ttl pk-yrs)    Types: Cigarettes    Start date: 10/15/1965    Quit date: 10/16/1974    Years since quitting: 48.7     Passive exposure: Past   Smokeless tobacco: Never  Vaping Use   Vaping status: Never Used  Substance and Sexual Activity   Alcohol use: Not Currently   Drug use: No   Sexual activity: Not Currently  Other Topics Concern   Not on file  Social History Narrative   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   Social Drivers of Corporate Investment Banker Strain: Low Risk  (04/04/2023)   Overall Financial Resource Strain (CARDIA)    Difficulty of Paying Living Expenses: Not hard at all  Food Insecurity: No Food Insecurity (04/04/2023)   Hunger Vital Sign    Worried About Running Out of Food in the Last Year: Never true    Ran Out of Food in the Last Year: Never true  Transportation Needs: No Transportation Needs (04/04/2023)   PRAPARE - Administrator, Civil Service (Medical): No    Lack of Transportation (Non-Medical): No  Physical Activity: Insufficiently Active (04/04/2023)   Exercise Vital Sign    Days of Exercise per Week: 3 days    Minutes of Exercise per Session: 30 min  Stress: No Stress Concern Present (04/04/2023)   [employer] of Occupational Health - Occupational Stress Questionnaire    Feeling of Stress : Not at all  Social Connections: Moderately Isolated (04/04/2023)   Social Connection and Isolation Panel [NHANES]    Frequency of Communication with Friends and Family: More than three times a week    Frequency of Social Gatherings with Friends and Family: More than three times a week    Attends Religious Services: Never    Database Administrator or Organizations: No    Attends Banker Meetings: Never    Marital Status: Married  Catering Manager Violence: Not At Risk (04/04/2023)   Humiliation, Afraid, Rape, and Kick questionnaire    Fear of Current or Ex-Partner:  No    Emotionally Abused: No    Physically Abused: No    Sexually Abused: No    Outpatient Medications Prior to Visit  Medication Sig Dispense Refill   ASHWAGANDHA PO Take 150 mg by mouth in the morning and at bedtime.     aspirin  EC 81 MG tablet Take 1 tablet (81 mg total) by mouth every evening. 30 tablet 12   diclofenac  Sodium (VOLTAREN ) 1 % GEL Apply 2 g topically daily as needed (pain).     finasteride  (PROSCAR ) 5 MG tablet Take 1 tablet (5 mg total) by mouth daily. 90 tablet 3   gabapentin  (NEURONTIN ) 300 MG capsule TAKE ONE CAPSULE IN THE MORNING AND TAKE TWO CAPSULES AT BEDTIME FOR ONE WEEK THEN INCREASE TO TWO CAPSULES TWICE DAILY FOR NERVE PAIN 360 capsule 1   Ginger, Zingiber officinalis, (GINGER PO) Take by mouth.     iron  polysaccharides (NIFEREX) 150 MG capsule Take 1 capsule (150 mg total) by mouth daily. (Patient taking differently: Take 150 mg by mouth every evening.) 30 capsule 1   Magnesium 400 MG CAPS Take 400 mg by mouth 2 (two) times daily. asporotate     melatonin 5 MG TABS Take 5 mg by mouth at bedtime.     methylcellulose (CITRUCEL) oral powder Take by mouth.     metoprolol  succinate (TOPROL  XL) 25 MG 24 hr tablet Take 1 tablet (25 mg total) by mouth daily. 90 tablet 3   Nitroglycerin  0.4 % OINT Place 1 g rectally as needed. (Patient not taking: Reported on 06/27/2023) 30 g 4   Omega-3 Fatty Acids (FISH OIL PO) Take 460 mg by mouth 2 (two) times daily.     omeprazole  (PRILOSEC) 40 MG capsule TAKE (1) CAPSULE BY MOUTH ONCE DAILY. 90 capsule 3   predniSONE  (DELTASONE ) 10 MG tablet Take 40 mg daily x 3 days, then 20  mg daily x 3 days, then 10 mg daily x 3 days, then stop- for radiculitis/acute on chronic nerve pain 21 tablet 0   silodosin  (RAPAFLO ) 8 MG CAPS capsule Take 1 capsule (8 mg total) by mouth 2 (two) times daily. 60 capsule 11   TART CHERRY PO Take by mouth.     TURMERIC PO Take 500 mg by mouth 2 (two) times daily. curamed curcumin     No  facility-administered medications prior to visit.    No Known Allergies  ROS Review of Systems  Constitutional:  Negative for chills and fever.  HENT:  Negative for congestion and sore throat.   Eyes:  Negative for pain and discharge.  Respiratory:  Negative for cough and shortness of breath.   Cardiovascular:  Negative for chest pain and palpitations.  Gastrointestinal:  Negative for diarrhea, nausea and vomiting.  Endocrine: Negative for polydipsia and polyuria.  Genitourinary:  Negative for dysuria and hematuria.  Musculoskeletal:  Positive for back pain and neck pain. Negative for neck stiffness.  Skin:  Negative for rash.  Neurological:  Positive for weakness, light-headedness and numbness. Negative for headaches.  Psychiatric/Behavioral:  Negative for agitation and behavioral problems.       Objective:    Physical Exam Vitals reviewed.  Constitutional:      General: He is not in acute distress.    Appearance: He is not diaphoretic.     Comments: Has a cane  HENT:     Head: Normocephalic and atraumatic.     Nose: Nose normal.     Mouth/Throat:     Mouth: Mucous membranes are moist.  Eyes:     General: No scleral icterus.    Extraocular Movements: Extraocular movements intact.  Cardiovascular:     Rate and Rhythm: Normal rate and regular rhythm.     Pulses: Normal pulses.     Heart sounds: Normal heart sounds. No murmur heard. Pulmonary:     Breath sounds: Normal breath sounds. No wheezing or rales.  Abdominal:     Palpations: Abdomen is soft.     Tenderness: There is no abdominal tenderness.  Musculoskeletal:        General: Tenderness present.     Cervical back: Neck supple. No tenderness.     Right lower leg: No edema.     Left lower leg: No edema.  Skin:    General: Skin is warm.     Findings: No rash.  Neurological:     General: No focal deficit present.     Mental Status: He is alert and oriented to person, place, and time.     Sensory: Sensory  deficit (LLE) present.     Motor: Weakness (4/5 in LLE) present.  Psychiatric:        Mood and Affect: Mood normal.        Behavior: Behavior normal.     BP 122/76 (BP Location: Left Arm)   Pulse 81   Ht 6' (1.829 m)   Wt 194 lb 12.8 oz (88.4 kg)   SpO2 98%   BMI 26.42 kg/m  Wt Readings from Last 3 Encounters:  07/19/23 194 lb 12.8 oz (88.4 kg)  06/03/23 194 lb (88 kg)  04/04/23 185 lb (83.9 kg)    Lab Results  Component Value Date   TSH 1.460 06/18/2022   Lab Results  Component Value Date   WBC 5.5 01/14/2023   HGB 9.6 (L) 01/14/2023   HCT 32.7 (L) 01/14/2023   MCV 70 (L)  01/14/2023   PLT 348 01/14/2023   Lab Results  Component Value Date   NA 138 01/14/2023   K 4.4 01/14/2023   CO2 23 01/14/2023   GLUCOSE 91 01/14/2023   BUN 13 01/14/2023   CREATININE 0.71 (L) 01/14/2023   BILITOT 0.8 09/29/2022   ALKPHOS 56 09/29/2022   AST 16 09/29/2022   ALT 13 09/29/2022   PROT 6.3 (L) 09/29/2022   ALBUMIN  3.6 09/29/2022   CALCIUM 9.3 01/14/2023   ANIONGAP 10 11/10/2022   EGFR 96 01/14/2023   GFR 112.81 09/28/2019   Lab Results  Component Value Date   CHOL 143 06/18/2022   Lab Results  Component Value Date   HDL 66 06/18/2022   Lab Results  Component Value Date   LDLCALC 63 06/18/2022   Lab Results  Component Value Date   TRIG 68 06/18/2022   Lab Results  Component Value Date   CHOLHDL 2.2 06/18/2022   Lab Results  Component Value Date   HGBA1C 5.6 09/29/2019      Assessment & Plan:   Problem List Items Addressed This Visit       Respiratory   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 Sherrilee Advised to decrease dose of silodosin  to 8 mg QD considering episodes of lightheadedness        Other   Encounter for general adult medical examination with abnormal findings - Primary   Physical exam as documented. Fasting blood tests ordered. Has had flu and pneumococcal vaccines at local pharmacy.      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 Also has lightheadedness, could be due to silodosin  - decreased dose Maintain adequate hydration and eat at regular intervals Check CBC, CMP, TSH Has had PT in the past      Relevant Orders   TSH   CMP14+EGFR   CBC with Differential/Platelet   B12   Palpitations   Well controlled with metoprolol  Followed by cardiology      Relevant Orders   TSH   CMP14+EGFR   Other Visit Diagnoses       Mixed hyperlipidemia       Relevant Orders   Lipid panel     Vitamin D  deficiency       Relevant Orders   VITAMIN D  25 Hydroxy (Vit-D Deficiency, Fractures)       No orders of the defined types were placed in this encounter.   Follow-up: Return in about 6 months (around 01/16/2024).    Suzzane MARLA Blanch, MD

## 2023-07-19 NOTE — Assessment & Plan Note (Signed)
 Takes Rapaflo and finasteride Followed by Dr. Ronne Binning Advised to decrease dose of silodosin to 8 mg QD considering episodes of lightheadedness

## 2023-07-20 LAB — CMP14+EGFR
ALT: 17 [IU]/L (ref 0–44)
AST: 25 [IU]/L (ref 0–40)
Albumin: 4.2 g/dL (ref 3.8–4.8)
Alkaline Phosphatase: 49 [IU]/L (ref 44–121)
BUN/Creatinine Ratio: 16 (ref 10–24)
BUN: 12 mg/dL (ref 8–27)
Bilirubin Total: 0.5 mg/dL (ref 0.0–1.2)
CO2: 23 mmol/L (ref 20–29)
Calcium: 8.9 mg/dL (ref 8.6–10.2)
Chloride: 103 mmol/L (ref 96–106)
Creatinine, Ser: 0.77 mg/dL (ref 0.76–1.27)
Globulin, Total: 2.4 g/dL (ref 1.5–4.5)
Glucose: 99 mg/dL (ref 70–99)
Potassium: 4.4 mmol/L (ref 3.5–5.2)
Sodium: 140 mmol/L (ref 134–144)
Total Protein: 6.6 g/dL (ref 6.0–8.5)
eGFR: 93 mL/min/{1.73_m2} (ref >59)

## 2023-07-20 LAB — CBC WITH DIFFERENTIAL/PLATELET
Basophils Absolute: 0 10*3/uL (ref 0.0–0.2)
Basos: 1 %
EOS (ABSOLUTE): 0.1 10*3/uL (ref 0.0–0.4)
Eos: 2 %
Hematocrit: 38 % (ref 37.5–51.0)
Hemoglobin: 11.4 g/dL — ABNORMAL LOW (ref 13.0–17.7)
Immature Grans (Abs): 0 10*3/uL (ref 0.0–0.1)
Immature Granulocytes: 0 %
Lymphocytes Absolute: 1.1 10*3/uL (ref 0.7–3.1)
Lymphs: 18 %
MCH: 22.6 pg — ABNORMAL LOW (ref 26.6–33.0)
MCHC: 30 g/dL — ABNORMAL LOW (ref 31.5–35.7)
MCV: 75 fL — ABNORMAL LOW (ref 79–97)
Monocytes Absolute: 0.7 10*3/uL (ref 0.1–0.9)
Monocytes: 11 %
Neutrophils Absolute: 4.1 10*3/uL (ref 1.4–7.0)
Neutrophils: 68 %
Platelets: 219 10*3/uL (ref 150–450)
RBC: 5.05 x10E6/uL (ref 4.14–5.80)
RDW: 18.2 % — ABNORMAL HIGH (ref 11.6–15.4)
WBC: 6 10*3/uL (ref 3.4–10.8)

## 2023-07-20 LAB — LIPID PANEL
Chol/HDL Ratio: 2.6 {ratio} (ref 0.0–5.0)
Cholesterol, Total: 165 mg/dL (ref 100–199)
HDL: 63 mg/dL (ref >39)
LDL Chol Calc (NIH): 84 mg/dL (ref 0–99)
Triglycerides: 98 mg/dL (ref 0–149)
VLDL Cholesterol Cal: 18 mg/dL (ref 5–40)

## 2023-07-20 LAB — VITAMIN D 25 HYDROXY (VIT D DEFICIENCY, FRACTURES): Vit D, 25-Hydroxy: 23.5 ng/mL — ABNORMAL LOW (ref 30.0–100.0)

## 2023-07-20 LAB — TSH: TSH: 1.14 u[IU]/mL (ref 0.450–4.500)

## 2023-07-20 LAB — VITAMIN B12: Vitamin B-12: 423 pg/mL (ref 232–1245)

## 2023-07-25 ENCOUNTER — Ambulatory Visit: Payer: Medicare PPO | Admitting: Urology

## 2023-07-25 ENCOUNTER — Encounter: Payer: Self-pay | Admitting: Urology

## 2023-07-25 VITALS — BP 125/81 | HR 85 | Temp 98.0°F

## 2023-07-25 DIAGNOSIS — N138 Other obstructive and reflux uropathy: Secondary | ICD-10-CM

## 2023-07-25 DIAGNOSIS — N401 Enlarged prostate with lower urinary tract symptoms: Secondary | ICD-10-CM

## 2023-07-25 DIAGNOSIS — R399 Unspecified symptoms and signs involving the genitourinary system: Secondary | ICD-10-CM | POA: Diagnosis not present

## 2023-07-25 DIAGNOSIS — Z638 Other specified problems related to primary support group: Secondary | ICD-10-CM | POA: Diagnosis not present

## 2023-07-25 DIAGNOSIS — M6289 Other specified disorders of muscle: Secondary | ICD-10-CM

## 2023-07-25 NOTE — Progress Notes (Signed)
 Name: Russell [name] DOB: 1946/01/09 MRN: [id number]  History of Present Illness: Russell [name] is a 78 y.o. male who presents today at [facility].  - GU history: 1. Neurogenic bladder and bowel. 2. BPH with LUTS. 3. Elevated PSA. 4. Kidney stone(s). 5. Erectile dysfunction.  PSA values: - 10/02/2021: 1.6 - 04/24/2023: 1.1  At last visit with Dr. Sherrilee on 04/24/2023: - Advised Rapaflo  8 mg daily and Finasteride  5 mg daily.  - PSA was normal.  Today: He reports acute episode of voiding dysfunction yesterday with urinary hesitancy, nocturia, increased urgency & frequency. Denies fevers, dysuria, gross hematuria, straining to void, or sensations of incomplete emptying.  Denies constipation. Takes Citrucel 2x/day.  Denies recent use of OTC medication such as Sudafed. Denies any recent change in daily caffeine intake. Denies any acute change in his neurologic status over the last 24-48 hours (no acute increase in lower extremity weakness, numbness, continence, etc).   He reports significant life stressors at this time - caring for his wife at home who is on hospice. He reports that last night he had very little sleep due to her acute condition / needs.  He reports increasing issues with his balance. He tried decreasing his Rapaflo  from 8 mg twice daily to once daily - denies any change in his balance with that but did notice worsened urinary stream, so he restarted taking Rapaflo  8 mg twice daily.    Fall Screening: Do you usually have a device to assist in your mobility? Yes   Medications: Current Outpatient Medications  Medication Sig Dispense Refill   ASHWAGANDHA PO Take 150 mg by mouth in the morning and at bedtime.     aspirin  EC 81 MG tablet Take 1 tablet (81 mg total) by mouth every evening. 30 tablet 12   diclofenac  Sodium (VOLTAREN ) 1 % GEL Apply 2 g topically daily as needed (pain).     finasteride  (PROSCAR ) 5 MG tablet Take 1 tablet (5 mg total)  by mouth daily. 90 tablet 3   gabapentin  (NEURONTIN ) 300 MG capsule TAKE ONE CAPSULE IN THE MORNING AND TAKE TWO CAPSULES AT BEDTIME FOR ONE WEEK THEN INCREASE TO TWO CAPSULES TWICE DAILY FOR NERVE PAIN (Patient taking differently: Take 300 mg by mouth daily.) 360 capsule 1   Ginger, Zingiber officinalis, (GINGER PO) Take by mouth.     iron  polysaccharides (NIFEREX) 150 MG capsule Take 1 capsule (150 mg total) by mouth daily. (Patient taking differently: Take 150 mg by mouth every evening.) 30 capsule 1   Magnesium 400 MG CAPS Take 400 mg by mouth 2 (two) times daily. asporotate     melatonin 5 MG TABS Take 5 mg by mouth at bedtime.     methylcellulose (CITRUCEL) oral powder Take by mouth.     metoprolol  succinate (TOPROL  XL) 25 MG 24 hr tablet Take 1 tablet (25 mg total) by mouth daily. 90 tablet 3   Nitroglycerin  0.4 % OINT Place 1 g rectally as needed. 30 g 4   Omega-3 Fatty Acids (FISH OIL PO) Take 460 mg by mouth 2 (two) times daily.     omeprazole  (PRILOSEC) 40 MG capsule TAKE (1) CAPSULE BY MOUTH ONCE DAILY. 90 capsule 3   silodosin  (RAPAFLO ) 8 MG CAPS capsule Take 1 capsule (8 mg total) by mouth 2 (two) times daily. 60 capsule 11   TART CHERRY PO Take by mouth.     TURMERIC PO Take 500 mg by mouth 2 (two) times daily.  curamed curcumin     predniSONE  (DELTASONE ) 10 MG tablet Take 40 mg daily x 3 days, then 20 mg daily x 3 days, then 10 mg daily x 3 days, then stop- for radiculitis/acute on chronic nerve pain (Patient not taking: Reported on 07/25/2023) 21 tablet 0   No current facility-administered medications for this visit.    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   anterior cer disc and fusion with plates R6-5 and C4-5 at [facility]   Colonic mass 09/29/2022   Constipation    Encounter for general adult medical examination with abnormal findings 06/27/2022   Enlarged prostate    GERD (gastroesophageal reflux disease)    Hemorrhoids    History of kidney stones    passed stones   History of lumbar laminectomy 05/12/2020   open L2-L5 at [facility]   Hx of colonic polyp 05/29/2018   05/2018 cecal ssp   Lung nodule 02/2021   Lyme disease    Maxillary sinus polyp    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   Mixed hyperlipidemia 07/19/2023   Pneumonia    Prolapsed internal hemorrhoids, grade 3 09/10/2006   Qualifier: Diagnosis of  By: Karren MD, Cornelius     Salivary gland enlargement    Sleep apnea 10 years ago   declined CPAP   Stroke (HCC) 15-20 years ago   Past Surgical History:  Procedure Laterality Date   BIOPSY  09/28/2022   Procedure: BIOPSY;  Surgeon: Legrand Victory LITTIE DOUGLAS, MD;  Location: [facility];  Service: Gastroenterology;;   BRONCHIAL BIOPSY  05/16/2021   Procedure: BRONCHIAL BIOPSIES;  Surgeon: Brenna Adine LITTIE, DO;  Location: [name];  Service: Pulmonary;;   BRONCHIAL BRUSHINGS  05/16/2021   Procedure: BRONCHIAL BRUSHINGS;  Surgeon: Brenna Adine LITTIE, DO;  Location: [name];  Service: Pulmonary;;   BRONCHIAL NEEDLE ASPIRATION BIOPSY  05/16/2021   Procedure: BRONCHIAL NEEDLE ASPIRATION BIOPSIES;  Surgeon: Brenna Adine LITTIE, DO;  Location: [name];  Service: Pulmonary;;   CERVICAL SPINE SURGERY  04/10/2020   C3-C4 with pin at [facility]   COLONOSCOPY     COLONOSCOPY WITH PROPOFOL  N/A 09/28/2022   Procedure: COLONOSCOPY WITH PROPOFOL ;  Surgeon: Legrand Victory LITTIE DOUGLAS, MD;  Location: [facility];  Service: Gastroenterology;  Laterality: N/A;   ESOPHAGOGASTRODUODENOSCOPY (EGD) WITH PROPOFOL  N/A 09/28/2022   Procedure: ESOPHAGOGASTRODUODENOSCOPY (EGD) WITH PROPOFOL ;  Surgeon: Legrand Victory LITTIE DOUGLAS, MD;  Location: [facility];   Service: Gastroenterology;  Laterality: N/A;   FIDUCIAL MARKER PLACEMENT  05/16/2021   Procedure: FIDUCIAL MARKER PLACEMENT;  Surgeon: Brenna Adine LITTIE, DO;  Location: [name];  Service: Pulmonary;;   HEMORRHOID BANDING     LUMBAR LAMINECTOMY  03/08/2010   L2-3, cauda equina syndrome   lumbar laminectomy  05/12/2020   [facility]   SPINE SURGERY     UPPER GI ENDOSCOPY     VIDEO BRONCHOSCOPY WITH ENDOBRONCHIAL NAVIGATION Left 05/16/2021   Procedure: VIDEO BRONCHOSCOPY WITH ENDOBRONCHIAL NAVIGATION;  Surgeon: Brenna Adine LITTIE, DO;  Location: [name];  Service: Pulmonary;  Laterality: Left;  ION w/ fiducial placement   VIDEO BRONCHOSCOPY WITH RADIAL  ENDOBRONCHIAL ULTRASOUND  05/16/2021   Procedure: VIDEO BRONCHOSCOPY WITH RADIAL ENDOBRONCHIAL ULTRASOUND;  Surgeon: Brenna Adine CROME, DO;  Location: [name];  Service: Pulmonary;;   wisdom teeth ext     Family History  Problem Relation Age of Onset   Aortic aneurysm Mother    Varicose Veins Mother    Heart attack Father    Arthritis Father    Aortic aneurysm Sister    Early death Sister    CVA Sister    Aortic aneurysm Sister    Aortic aneurysm Sister    Colon cancer Neg Hx    Pancreatic cancer Neg Hx    Rectal cancer Neg Hx    Stomach cancer Neg Hx    Esophageal cancer Neg Hx    Colon polyps Neg Hx    Social History   Socioeconomic History   Marital status: Married    Spouse name: Not on file   Number of children: 0   Years of education: Not on file   Highest education level: Master's degree (e.g., MA, MS, MEng, MEd, MSW, MBA)  Occupational History   Occupation: retired  Tobacco Use   Smoking status: Former    Current packs/day: 0.00    Average packs/day: 0.3 packs/day for 9.0 years (2.3 ttl pk-yrs)    Types: Cigarettes    Start date: 10/15/1965    Quit date: 10/16/1974    Years since quitting: 48.8    Passive exposure: Past   Smokeless tobacco: Never  Vaping Use   Vaping status: Never Used  Substance and Sexual  Activity   Alcohol use: Not Currently   Drug use: No   Sexual activity: Not Currently  Other Topics Concern   Not on file  Social History Narrative   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   Social Drivers of Corporate Investment Banker Strain: Low Risk  (04/04/2023)   Overall Financial Resource Strain (CARDIA)    Difficulty of Paying Living Expenses: Not hard at all  Food Insecurity: No Food Insecurity (04/04/2023)   Hunger Vital Sign    Worried About Running Out of Food in the Last Year: Never true    Ran Out of Food in the Last Year: Never true  Transportation Needs: No Transportation Needs (04/04/2023)   PRAPARE - Administrator, Civil Service (Medical): No    Lack of Transportation (Non-Medical): No  Physical Activity: Insufficiently Active (04/04/2023)   Exercise Vital Sign    Days of Exercise per Week: 3 days    Minutes of Exercise per Session: 30 min  Stress: No Stress Concern Present (04/04/2023)   [employer] of Occupational Health - Occupational Stress Questionnaire    Feeling of Stress : Not at all  Social Connections: Moderately Isolated (04/04/2023)   Social Connection and Isolation Panel [NHANES]    Frequency of Communication with Friends and Family: More than three times a week    Frequency of Social Gatherings with Friends and Family: More than three times a week    Attends Religious Services: Never    Database Administrator or Organizations: No    Attends Banker Meetings: Never    Marital Status: Married  Catering Manager Violence: Not At Risk (04/04/2023)  Humiliation, Afraid, Rape, and Kick questionnaire    Fear of Current or Ex-Partner: No    Emotionally Abused: No    Physically Abused: No    Sexually Abused: No    Review of  Systems Constitutional: Patient denies any unintentional weight loss or change in strength lntegumentary: Patient denies any rashes or pruritus Cardiovascular: Patient denies chest pain or syncope Respiratory: Patient denies shortness of breath Gastrointestinal: Patient denies nausea, vomiting, constipation, or diarrhea Musculoskeletal: Patient denies muscle cramps or weakness Neurologic: Patient denies convulsions or seizures Allergic/Immunologic: Patient denies recent allergic reaction(s) Hematologic/Lymphatic: Patient denies bleeding tendencies Endocrine: Patient denies heat/cold intolerance  GU: As per HPI.  OBJECTIVE Vitals:   07/25/23 1424  BP: 125/81  Pulse: 85  Temp: 98 F (36.7 C)   There is no height or weight on file to calculate BMI.  Physical Examination Constitutional: No obvious distress; patient is non-toxic appearing  Cardiovascular: No visible lower extremity edema.  Respiratory: The patient does not have audible wheezing/stridor; respirations do not appear labored  Gastrointestinal: Abdomen non-distended Musculoskeletal: Normal ROM of UEs  Skin: No obvious rashes/open sores  Neurologic: CN 2-12 grossly intact Psychiatric: Answered questions appropriately with normal affect  Hematologic/Lymphatic/Immunologic: No obvious bruises or sites of spontaneous bleeding  UA: negative PVR: 93 ml  ASSESSMENT Lower urinary tract symptoms (LUTS)  Benign prostatic hyperplasia with urinary obstruction  Pelvic floor dysfunction  Caregiver role strain  We discussed acute episode of increased LUTS, which is most likely due to pelvic floor muscle dysfunction secondary to his significant life stressors at this time and lack of sleep. No evidence of UTI or acute urinary retention. We discussed management options including relaxation techniques, double voiding, etc. We agreed not to start any oral muscle relaxer due to risk for sedation and falls.   Advised to continue  current medications and follow up as previously scheduled with Dr. Sherrilee on 04/24/2024 or sooner if needed. Pt verbalized understanding and agreement. All questions were answered.  PLAN Advised the following: 1. Continue Rapaflo  8 mg daily and Finasteride  5 mg daily.  2. Return in 9 months (on 04/24/2024) for f/u with Dr. Sherrilee, as previously scheduled.  No orders of the defined types were placed in this encounter.  Total time spent caring for the patient today was over 30 minutes. This includes time spent on the date of the visit reviewing the patient's chart before the visit, time spent during the visit, and time spent after the visit on documentation. Over 50% of that time was spent in face-to-face time with this patient for direct counseling. E&M based on time and complexity of medical decision making.  It has been explained that the patient is to follow regularly with their PCP in addition to all other providers involved in their care and to follow instructions provided by these respective offices. Patient advised to contact urology clinic if any urologic-pertaining questions, concerns, new symptoms or problems arise in the interim period.  There are no Patient Instructions on file for this visit.  Electronically signed by:  Lauraine JAYSON Oz, FNP   07/25/23    3:04 PM

## 2023-07-25 NOTE — Progress Notes (Signed)
PVR = >93

## 2023-08-02 ENCOUNTER — Other Ambulatory Visit: Payer: Self-pay | Admitting: Cardiovascular Disease

## 2023-08-02 DIAGNOSIS — R002 Palpitations: Secondary | ICD-10-CM

## 2023-08-23 DIAGNOSIS — M9901 Segmental and somatic dysfunction of cervical region: Secondary | ICD-10-CM | POA: Diagnosis not present

## 2023-08-23 DIAGNOSIS — M542 Cervicalgia: Secondary | ICD-10-CM | POA: Diagnosis not present

## 2023-08-23 DIAGNOSIS — M546 Pain in thoracic spine: Secondary | ICD-10-CM | POA: Diagnosis not present

## 2023-08-23 DIAGNOSIS — M9902 Segmental and somatic dysfunction of thoracic region: Secondary | ICD-10-CM | POA: Diagnosis not present

## 2023-09-06 ENCOUNTER — Telehealth: Payer: Self-pay | Admitting: Cardiovascular Disease

## 2023-09-06 DIAGNOSIS — M9902 Segmental and somatic dysfunction of thoracic region: Secondary | ICD-10-CM | POA: Diagnosis not present

## 2023-09-06 DIAGNOSIS — M542 Cervicalgia: Secondary | ICD-10-CM | POA: Diagnosis not present

## 2023-09-06 DIAGNOSIS — M546 Pain in thoracic spine: Secondary | ICD-10-CM | POA: Diagnosis not present

## 2023-09-06 DIAGNOSIS — M9901 Segmental and somatic dysfunction of cervical region: Secondary | ICD-10-CM | POA: Diagnosis not present

## 2023-09-06 NOTE — Telephone Encounter (Signed)
Patient is requesting to switch from Dr. Tresa Endo to Dr. Jenene Slicker. He lives in [place], [place] and the location would be more convenient. Please advise.

## 2023-09-08 NOTE — Telephone Encounter (Signed)
 Ok by me

## 2023-09-20 DIAGNOSIS — M546 Pain in thoracic spine: Secondary | ICD-10-CM | POA: Diagnosis not present

## 2023-09-20 DIAGNOSIS — M9901 Segmental and somatic dysfunction of cervical region: Secondary | ICD-10-CM | POA: Diagnosis not present

## 2023-09-20 DIAGNOSIS — M9902 Segmental and somatic dysfunction of thoracic region: Secondary | ICD-10-CM | POA: Diagnosis not present

## 2023-09-20 DIAGNOSIS — M542 Cervicalgia: Secondary | ICD-10-CM | POA: Diagnosis not present

## 2023-09-27 ENCOUNTER — Encounter: Payer: Medicare PPO | Admitting: Physical Medicine and Rehabilitation

## 2023-10-21 ENCOUNTER — Encounter: Payer: Self-pay | Admitting: Internal Medicine

## 2023-10-23 ENCOUNTER — Encounter: Payer: Self-pay | Admitting: Internal Medicine

## 2023-10-23 ENCOUNTER — Ambulatory Visit: Payer: Self-pay | Admitting: Internal Medicine

## 2023-10-23 VITALS — BP 106/70 | HR 94 | Ht 72.0 in | Wt 185.6 lb

## 2023-10-23 DIAGNOSIS — L239 Allergic contact dermatitis, unspecified cause: Secondary | ICD-10-CM | POA: Diagnosis not present

## 2023-10-23 DIAGNOSIS — R202 Paresthesia of skin: Secondary | ICD-10-CM | POA: Diagnosis not present

## 2023-10-23 MED ORDER — TRIAMCINOLONE ACETONIDE 0.1 % EX CREA: 1.0000 | TOPICAL_CREAM | Freq: Two times a day (BID) | CUTANEOUS | 0 refills | Status: DC

## 2023-10-23 NOTE — Patient Instructions (Signed)
 Please apply Kenalog cream for rash on left arm.

## 2023-10-25 NOTE — Progress Notes (Signed)
 Acute Office Visit  Subjective:    Patient ID: Russell [name], male    DOB: 12-Oct-1945, 78 y.o.   MRN: [id number]  Chief Complaint  Patient presents with   Skin Problem    Pt has a spot on his left arm near wrist , concerned about melanoma. Noticed it 1 week ago.     HPI Patient is in today for complaint of a brownish-red spot on the left forearm area.  Denies any change in color or irregular borders.  Had mild irritation initially.  Denies any known insect or tick bite.  He also reports chronic itching on the upper back area, has been evaluated by Dr. Jorja Loa in the past.  He requests dermatology referral.  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   Mixed hyperlipidemia 07/19/2023   Pneumonia    Prolapsed internal hemorrhoids, grade 3 09/10/2006   Qualifier: Diagnosis of  By: Erby Pian MD, Cornelius     Salivary gland enlargement    Sleep apnea 10 years ago   declined CPAP   Stroke (HCC) 15-20 years ago    Past Surgical History:  Procedure Laterality Date   BIOPSY  09/28/2022   Procedure: BIOPSY;  Surgeon: Sherrilyn Rist, MD;  Location: [facility]  ENDOSCOPY;  Service: Gastroenterology;;   BRONCHIAL BIOPSY  05/16/2021   Procedure: BRONCHIAL BIOPSIES;  Surgeon: Josephine Igo, DO;  Location: [name];  Service: Pulmonary;;   BRONCHIAL BRUSHINGS  05/16/2021   Procedure: BRONCHIAL BRUSHINGS;  Surgeon: Josephine Igo, DO;  Location: [name];  Service: Pulmonary;;   BRONCHIAL NEEDLE ASPIRATION BIOPSY  05/16/2021   Procedure: BRONCHIAL NEEDLE ASPIRATION BIOPSIES;  Surgeon: Josephine Igo, DO;  Location: [name];  Service: Pulmonary;;   CERVICAL SPINE SURGERY  04/10/2020   C3-C4 with pin at [facility]   COLONOSCOPY     COLONOSCOPY WITH PROPOFOL N/A 09/28/2022   Procedure: COLONOSCOPY WITH PROPOFOL;  Surgeon: Sherrilyn Rist, MD;  Location: [facility] ENDOSCOPY;  Service: Gastroenterology;  Laterality: N/A;   ESOPHAGOGASTRODUODENOSCOPY (EGD) WITH PROPOFOL N/A 09/28/2022   Procedure: ESOPHAGOGASTRODUODENOSCOPY (EGD) WITH PROPOFOL;  Surgeon: Sherrilyn Rist, MD;  Location: [facility] ENDOSCOPY;  Service: Gastroenterology;  Laterality: N/A;   FIDUCIAL MARKER PLACEMENT  05/16/2021   Procedure: FIDUCIAL MARKER PLACEMENT;  Surgeon: Josephine Igo, DO;  Location: [name];  Service: Pulmonary;;   HEMORRHOID BANDING     LUMBAR LAMINECTOMY  03/08/2010   L2-3, cauda equina syndrome   lumbar laminectomy  05/12/2020   [facility]   SPINE SURGERY     UPPER GI ENDOSCOPY     VIDEO BRONCHOSCOPY WITH ENDOBRONCHIAL NAVIGATION Left 05/16/2021   Procedure: VIDEO BRONCHOSCOPY WITH ENDOBRONCHIAL NAVIGATION;  Surgeon: Josephine Igo, DO;  Location: [name];  Service: Pulmonary;  Laterality: Left;  ION w/ fiducial placement   VIDEO BRONCHOSCOPY WITH RADIAL ENDOBRONCHIAL ULTRASOUND  05/16/2021   Procedure: VIDEO BRONCHOSCOPY WITH RADIAL ENDOBRONCHIAL ULTRASOUND;  Surgeon: Josephine Igo, DO;  Location: [name];  Service: Pulmonary;;   wisdom teeth ext      Family History  Problem Relation Age of Onset   Aortic aneurysm Mother    Varicose Veins  Mother    Heart attack Father    Arthritis Father    Aortic aneurysm Sister    Early death Sister    CVA Sister    Aortic aneurysm Sister    Aortic aneurysm Sister    Colon cancer Neg Hx    Pancreatic cancer Neg Hx    Rectal cancer Neg Hx    Stomach cancer Neg Hx    Esophageal cancer Neg Hx    Colon polyps Neg Hx     Social History   Socioeconomic History   Marital status: Married    Spouse name: Not on file   Number of children: 0   Years of education: Not on file   Highest education level: Master's degree (e.g., MA, MS, MEng, MEd, MSW, MBA)  Occupational History   Occupation: retired  Tobacco Use   Smoking status: Former    Current packs/day: 0.00    Average packs/day: 0.3 packs/day for 9.0 years (2.3 ttl pk-yrs)    Types: Cigarettes    Start date: 10/15/1965    Quit date: 10/16/1974    Years since quitting: 49.0    Passive exposure: Past   Smokeless tobacco: Never  Vaping Use   Vaping status: Never Used  Substance and Sexual Activity   Alcohol use: Not Currently   Drug use: No   Sexual activity: Not Currently  Other Topics Concern   Not on file  Social History Narrative   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   Social Drivers of Corporate investment banker Strain: Low Risk  (04/04/2023)   Overall Financial Resource Strain (CARDIA)    Difficulty of Paying Living Expenses: Not hard at all  Food Insecurity: No Food Insecurity (04/04/2023)   Hunger Vital Sign    Worried About Running Out of Food in the Last Year: Never true    Ran Out of Food in the Last Year: Never true  Transportation Needs: No Transportation Needs (04/04/2023)   PRAPARE - Administrator, Civil Service (Medical): No    Lack of Transportation (Non-Medical): No  Physical Activity: Insufficiently Active  (04/04/2023)   Exercise Vital Sign    Days of Exercise per Week: 3 days    Minutes of Exercise per Session: 30 min  Stress: No Stress Concern Present (04/04/2023)   [employer] of Occupational Health - Occupational Stress Questionnaire    Feeling of Stress : Not at all  Social Connections: Moderately Isolated (04/04/2023)   Social Connection and Isolation Panel [NHANES]    Frequency of Communication with Friends and Family: More  than three times a week    Frequency of Social Gatherings with Friends and Family: More than three times a week    Attends Religious Services: Never    Database administrator or Organizations: No    Attends Banker Meetings: Never    Marital Status: Married  Catering manager Violence: Not At Risk (04/04/2023)   Humiliation, Afraid, Rape, and Kick questionnaire    Fear of Current or Ex-Partner: No    Emotionally Abused: No    Physically Abused: No    Sexually Abused: No    Outpatient Medications Prior to Visit  Medication Sig Dispense Refill   ASHWAGANDHA PO Take 150 mg by mouth in the morning and at bedtime.     aspirin EC 81 MG tablet Take 1 tablet (81 mg total) by mouth every evening. 30 tablet 12   diclofenac Sodium (VOLTAREN) 1 % GEL Apply 2 g topically daily as needed (pain).     finasteride (PROSCAR) 5 MG tablet Take 1 tablet (5 mg total) by mouth daily. 90 tablet 3   gabapentin (NEURONTIN) 300 MG capsule TAKE ONE CAPSULE IN THE MORNING AND TAKE TWO CAPSULES AT BEDTIME FOR ONE WEEK THEN INCREASE TO TWO CAPSULES TWICE DAILY FOR NERVE PAIN (Patient taking differently: Take 300 mg by mouth daily.) 360 capsule 1   Ginger, Zingiber officinalis, (GINGER PO) Take by mouth.     iron polysaccharides (NIFEREX) 150 MG capsule Take 1 capsule (150 mg total) by mouth daily. (Patient taking differently: Take 150 mg by mouth every evening.) 30 capsule 1   Magnesium 400 MG CAPS Take 400 mg by mouth 2 (two) times daily. asporotate     melatonin 5 MG TABS  Take 5 mg by mouth at bedtime.     methylcellulose (CITRUCEL) oral powder Take by mouth.     metoprolol succinate (TOPROL-XL) 25 MG 24 hr tablet Take 1 tablet (25 mg total) by mouth daily. 90 tablet 3   Nitroglycerin 0.4 % OINT Place 1 g rectally as needed. 30 g 4   Omega-3 Fatty Acids (FISH OIL PO) Take 460 mg by mouth 2 (two) times daily.     omeprazole (PRILOSEC) 40 MG capsule TAKE (1) CAPSULE BY MOUTH ONCE DAILY. 90 capsule 3   predniSONE (DELTASONE) 10 MG tablet Take 40 mg daily x 3 days, then 20 mg daily x 3 days, then 10 mg daily x 3 days, then stop- for radiculitis/acute on chronic nerve pain 21 tablet 0   silodosin (RAPAFLO) 8 MG CAPS capsule Take 1 capsule (8 mg total) by mouth 2 (two) times daily. 60 capsule 11   TART CHERRY PO Take by mouth.     TURMERIC PO Take 500 mg by mouth 2 (two) times daily. curamed curcumin     No facility-administered medications prior to visit.    No Known Allergies  Review of Systems  Constitutional:  Negative for chills and fever.  HENT:  Negative for congestion and sore throat.   Eyes:  Negative for pain and discharge.  Respiratory:  Negative for cough and shortness of breath.   Cardiovascular:  Negative for chest pain and palpitations.  Gastrointestinal:  Negative for diarrhea, nausea and vomiting.  Endocrine: Negative for polydipsia and polyuria.  Genitourinary:  Negative for dysuria and hematuria.  Musculoskeletal:  Positive for back pain and neck pain. Negative for neck stiffness.  Skin:  Positive for rash.  Neurological:  Positive for weakness, light-headedness and numbness. Negative for headaches.  Psychiatric/Behavioral:  Negative for agitation and behavioral problems.        Objective:    Physical Exam Vitals reviewed.  Constitutional:      General: He is not in acute distress.    Appearance: He is not diaphoretic.     Comments: Has a cane  HENT:     Head: Normocephalic and atraumatic.     Nose: Nose normal.      Mouth/Throat:     Mouth: Mucous membranes are moist.  Eyes:     General: No scleral icterus.    Extraocular Movements: Extraocular movements intact.  Cardiovascular:     Rate and Rhythm: Normal rate and regular rhythm.     Pulses: Normal pulses.     Heart sounds: Normal heart sounds. No murmur heard. Pulmonary:     Breath sounds: Normal breath sounds. No wheezing or rales.  Musculoskeletal:        General: Tenderness present.     Cervical back: Neck supple. No tenderness.     Right lower leg: No edema.     Left lower leg: No edema.  Skin:    General: Skin is warm.     Findings: Rash (Brownish patch over left forearm area, about 1 cm in diameter) present.  Neurological:     General: No focal deficit present.     Mental Status: He is alert and oriented to person, place, and time.     Sensory: Sensory deficit (LLE) present.     Motor: Weakness (4/5 in LLE) present.  Psychiatric:        Mood and Affect: Mood normal.        Behavior: Behavior normal.     BP 106/70   Pulse 94   Ht 6' (1.829 m)   Wt 185 lb 9.6 oz (84.2 kg)   SpO2 96%   BMI 25.17 kg/m  Wt Readings from Last 3 Encounters:  10/23/23 185 lb 9.6 oz (84.2 kg)  07/19/23 194 lb 12.8 oz (88.4 kg)  06/03/23 194 lb (88 kg)        Assessment & Plan:   Problem List Items Addressed This Visit       Nervous and Auditory   Notalgia paresthetica   Has chronic upper back itching, no apparent rash noted Advised to apply lotion or moisturizer Referred to dermatology for skin cancer screening        Musculoskeletal and Integument   Allergic dermatitis - Primary   Rash over left forearm appears to be allergic in etiology Kenalog cream prescribed      Relevant Medications   triamcinolone cream (KENALOG) 0.1 %   Other Relevant Orders   Ambulatory referral to Dermatology     Meds ordered this encounter  Medications   triamcinolone cream (KENALOG) 0.1 %    Sig: Apply 1 Application topically 2 (two) times  daily.    Dispense:  30 g    Refill:  0     Lonie Rummell Concha Se, MD

## 2023-10-25 NOTE — Assessment & Plan Note (Addendum)
 Rash over left forearm appears to be allergic in etiology Kenalog cream prescribed

## 2023-10-25 NOTE — Assessment & Plan Note (Signed)
 Has chronic upper back itching, no apparent rash noted Advised to apply lotion or moisturizer Referred to dermatology for skin cancer screening

## 2023-10-29 ENCOUNTER — Telehealth: Payer: Self-pay | Admitting: Internal Medicine

## 2023-10-29 DIAGNOSIS — Z66 Do not resuscitate: Secondary | ICD-10-CM

## 2023-10-29 NOTE — Telephone Encounter (Signed)
 Patient came by office and needs a DNR re

## 2023-10-30 DIAGNOSIS — Z66 Do not resuscitate: Secondary | ICD-10-CM | POA: Insufficient documentation

## 2023-10-30 NOTE — Telephone Encounter (Signed)
 Provide patient with yellow [place]  [employer]'s "Do Not Resuscitate Order"

## 2023-10-30 NOTE — Telephone Encounter (Signed)
 Form received will give to pcp to complete.

## 2023-11-01 ENCOUNTER — Telehealth: Payer: Self-pay

## 2023-11-01 NOTE — Telephone Encounter (Signed)
 Copied from CRM [id number]. Topic: Clinical - Medical Advice >> Nov 01, 2023  9:13 AM Carlatta H wrote: Reason for CRM: Patient is having issues with poor with toes on left foot//Patient said this was a previous issue//He would like to see if he could be fit in the schedule, right now there are no available appointment until 5/21//Please call patient to advise

## 2023-11-01 NOTE — Telephone Encounter (Signed)
 Scheduled him on Monday at 11:40, however I told him to come in earlier if at all possible, pt agreed. Just an fyi.

## 2023-11-03 ENCOUNTER — Encounter: Payer: Self-pay | Admitting: Rheumatology

## 2023-11-04 ENCOUNTER — Ambulatory Visit: Admitting: Internal Medicine

## 2023-11-04 ENCOUNTER — Encounter: Payer: Self-pay | Admitting: Internal Medicine

## 2023-11-04 VITALS — BP 129/67 | HR 70 | Ht 74.0 in | Wt 187.6 lb

## 2023-11-04 DIAGNOSIS — I739 Peripheral vascular disease, unspecified: Secondary | ICD-10-CM

## 2023-11-04 DIAGNOSIS — M21611 Bunion of right foot: Secondary | ICD-10-CM | POA: Diagnosis not present

## 2023-11-04 NOTE — Telephone Encounter (Signed)
 It is not recommend to exceed 2000 mg of tumeric daily due to risk of liver enzyme elevation so taking this supplement once daily should be sufficient.   Also I would recommend natural ginger-raw--not candied-sugar is pro-inflammatory.  He could try ginger tea or a supplement if he does not like ginger root.

## 2023-11-04 NOTE — Progress Notes (Signed)
 Acute Office Visit  Subjective:    Patient ID: Russell [name], male    DOB: 06-Nov-1945, 78 y.o.   MRN: [id number]  Chief Complaint  Patient presents with   Foot Pain    Pt reports foot pain on his left foot, has some swelling on two of his toes, has concerns about blood not circulating well.     HPI Patient is in today for complaint of chronic swelling of left foot 2nd and 3rd toes with purplish discoloration.  He has intermittent pain sensation as well, worse with walking.  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  Denies any recent change in sensation.  Denies any recent injury or fall.  He also reports having a bunion on the lateral side of the right foot.  He has pain off right foot over the dorsal aspect as well, which is constant, sharp, nonradiating.  He has been evaluated by podiatrist in the past, and was given specific shoe for it, but did not relieve his symptoms.  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   anterior cer disc and fusion with plates W0-9 and C4-5 at [facility]   Colonic mass 09/29/2022   Constipation    Encounter for general adult medical examination with abnormal findings 06/27/2022   Enlarged prostate    GERD (gastroesophageal reflux disease)    Hemorrhoids    History of kidney stones    passed stones   History of lumbar laminectomy 05/12/2020   open L2-L5 at [facility]   Hx of colonic polyp 05/29/2018   05/2018 cecal ssp   Lung nodule 02/2021   Lyme disease    Maxillary sinus polyp    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   Mixed hyperlipidemia 07/19/2023   Pneumonia    Prolapsed internal hemorrhoids, grade 3 09/10/2006    Qualifier: Diagnosis of  By: Nana Avers MD, Cornelius     Salivary gland enlargement    Sleep apnea 10 years ago   declined CPAP   Stroke (HCC) 15-20 years ago    Past Surgical History:  Procedure Laterality Date   BIOPSY  09/28/2022   Procedure: BIOPSY;  Surgeon: Albertina Hugger, MD;  Location: [facility];  Service: Gastroenterology;;   BRONCHIAL BIOPSY  05/16/2021   Procedure: BRONCHIAL BIOPSIES;  Surgeon: Prudy Brownie, DO;  Location: [name];  Service: Pulmonary;;   BRONCHIAL BRUSHINGS  05/16/2021   Procedure: BRONCHIAL BRUSHINGS;  Surgeon: Prudy Brownie, DO;  Location: [name];  Service: Pulmonary;;   BRONCHIAL NEEDLE ASPIRATION BIOPSY  05/16/2021   Procedure: BRONCHIAL NEEDLE ASPIRATION BIOPSIES;  Surgeon: Prudy Brownie, DO;  Location: [name];  Service: Pulmonary;;   CERVICAL SPINE SURGERY  04/10/2020   C3-C4 with pin at [facility]   COLONOSCOPY     COLONOSCOPY WITH PROPOFOL  N/A 09/28/2022   Procedure: COLONOSCOPY WITH PROPOFOL ;  Surgeon: Albertina Hugger, MD;  Location: [facility];  Service: Gastroenterology;  Laterality: N/A;   ESOPHAGOGASTRODUODENOSCOPY (EGD) WITH PROPOFOL  N/A 09/28/2022   Procedure: ESOPHAGOGASTRODUODENOSCOPY (EGD) WITH PROPOFOL ;  Surgeon: Albertina Hugger, MD;  Location: [facility];  Service: Gastroenterology;  Laterality: N/A;   FIDUCIAL MARKER PLACEMENT  05/16/2021   Procedure: FIDUCIAL MARKER PLACEMENT;  Surgeon: Prudy Brownie, DO;  Location: [name];  Service: Pulmonary;;   HEMORRHOID BANDING     LUMBAR LAMINECTOMY  03/08/2010   L2-3, cauda equina syndrome   lumbar laminectomy  05/12/2020   [facility]   SPINE SURGERY     UPPER GI ENDOSCOPY     VIDEO BRONCHOSCOPY WITH ENDOBRONCHIAL NAVIGATION Left 05/16/2021   Procedure: VIDEO BRONCHOSCOPY WITH ENDOBRONCHIAL NAVIGATION;  Surgeon: Prudy Brownie, DO;  Location: [name];  Service: Pulmonary;  Laterality: Left;  ION w/ fiducial placement   VIDEO BRONCHOSCOPY WITH RADIAL  ENDOBRONCHIAL ULTRASOUND  05/16/2021   Procedure: VIDEO BRONCHOSCOPY WITH RADIAL ENDOBRONCHIAL ULTRASOUND;  Surgeon: Prudy Brownie, DO;  Location: [name];  Service: Pulmonary;;   wisdom teeth ext      Family History  Problem Relation Age of Onset   Aortic aneurysm Mother    Varicose Veins Mother    Heart attack Father    Arthritis Father    Aortic aneurysm Sister    Early death Sister    CVA Sister    Aortic aneurysm Sister    Aortic aneurysm Sister    Colon cancer Neg Hx    Pancreatic cancer Neg Hx    Rectal cancer Neg Hx    Stomach cancer Neg Hx    Esophageal cancer Neg Hx    Colon polyps Neg Hx     Social History   Socioeconomic History   Marital status: Widowed    Spouse name: Not on file   Number of children: 0   Years of education: Not on file   Highest education level: Master's degree (e.g., MA, MS, MEng, MEd, MSW, MBA)  Occupational History   Occupation: retired  Tobacco Use   Smoking status: Former    Current packs/day: 0.00    Average packs/day: 0.3 packs/day for 9.0 years (2.3 ttl pk-yrs)    Types: Cigarettes    Start date: 10/15/1965    Quit date: 10/16/1974    Years since quitting: 49.0    Passive exposure: Past   Smokeless tobacco: Never  Vaping Use   Vaping status: Never Used  Substance and Sexual Activity   Alcohol use: Not Currently   Drug use: No   Sexual activity: Not Currently  Other Topics Concern   Not on file  Social History Narrative   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   Social Drivers of Corporate investment banker Strain: Low Risk  (11/03/2023)   Overall Financial Resource Strain (CARDIA)    Difficulty of Paying Living Expenses: Not very hard  Food Insecurity: No Food Insecurity (11/03/2023)   Hunger Vital Sign    Worried About Running Out  of Food in the Last Year: Never true    Ran Out of Food in the Last Year: Never true  Transportation Needs: No Transportation Needs (11/03/2023)   PRAPARE - Administrator, Civil Service (Medical): No    Lack of Transportation (Non-Medical): No  Physical Activity: Sufficiently Active (11/03/2023)   Exercise Vital Sign    Days of Exercise per Week: 7 days    Minutes of Exercise  per Session: 40 min  Stress: No Stress Concern Present (11/03/2023)   [employer] of Occupational Health - Occupational Stress Questionnaire    Feeling of Stress : Not at all  Social Connections: Moderately Isolated (11/03/2023)   Social Connection and Isolation Panel [NHANES]    Frequency of Communication with Friends and Family: More than three times a week    Frequency of Social Gatherings with Friends and Family: Twice a week    Attends Religious Services: More than 4 times per year    Active Member of [employer] or Organizations: No    Attends Banker Meetings: Never    Marital Status: Widowed  Intimate Partner Violence: Not At Risk (04/04/2023)   Humiliation, Afraid, Rape, and Kick questionnaire    Fear of Current or Ex-Partner: No    Emotionally Abused: No    Physically Abused: No    Sexually Abused: No    Outpatient Medications Prior to Visit  Medication Sig Dispense Refill   aspirin  EC 81 MG tablet Take 1 tablet (81 mg total) by mouth every evening. 30 tablet 12   finasteride  (PROSCAR ) 5 MG tablet Take 1 tablet (5 mg total) by mouth daily. 90 tablet 3   Ginger, Zingiber officinalis, (GINGER PO) Take by mouth.     iron  polysaccharides (NIFEREX) 150 MG capsule Take 1 capsule (150 mg total) by mouth daily. (Patient taking differently: Take 150 mg by mouth every evening.) 30 capsule 1   Magnesium 400 MG CAPS Take 400 mg by mouth 2 (two) times daily. asporotate     melatonin 5 MG TABS Take 5 mg by mouth at bedtime.     methylcellulose (CITRUCEL) oral powder Take by mouth.      metoprolol  succinate (TOPROL -XL) 25 MG 24 hr tablet Take 1 tablet (25 mg total) by mouth daily. 90 tablet 3   Multiple Vitamin (MULTIVITAMIN) tablet Take 1 tablet by mouth daily.     Omega-3 Fatty Acids (FISH OIL PO) Take 460 mg by mouth 2 (two) times daily.     omeprazole  (PRILOSEC) 40 MG capsule TAKE (1) CAPSULE BY MOUTH ONCE DAILY. 90 capsule 3   silodosin  (RAPAFLO ) 8 MG CAPS capsule Take 1 capsule (8 mg total) by mouth 2 (two) times daily. 60 capsule 11   TART CHERRY PO Take by mouth.     TURMERIC PO Take 500 mg by mouth 2 (two) times daily. curamed curcumin     ASHWAGANDHA PO Take 150 mg by mouth in the morning and at bedtime.     diclofenac  Sodium (VOLTAREN ) 1 % GEL Apply 2 g topically daily as needed (pain).     gabapentin  (NEURONTIN ) 300 MG capsule TAKE ONE CAPSULE IN THE MORNING AND TAKE TWO CAPSULES AT BEDTIME FOR ONE WEEK THEN INCREASE TO TWO CAPSULES TWICE DAILY FOR NERVE PAIN (Patient taking differently: Take 300 mg by mouth daily.) 360 capsule 1   Nitroglycerin  0.4 % OINT Place 1 g rectally as needed. 30 g 4   predniSONE  (DELTASONE ) 10 MG tablet Take 40 mg daily x 3 days, then 20 mg daily x 3 days, then 10 mg daily x 3 days, then stop- for radiculitis/acute on chronic nerve pain 21 tablet 0   triamcinolone  cream (KENALOG ) 0.1 % Apply 1 Application topically 2 (two) times daily. 30 g 0   No facility-administered medications prior to visit.    No Known Allergies  Review of Systems  Constitutional:  Negative for chills and fever.  HENT:  Negative for congestion  and sore throat.   Eyes:  Negative for pain and discharge.  Respiratory:  Negative for cough and shortness of breath.   Cardiovascular:  Negative for chest pain and palpitations.  Gastrointestinal:  Negative for diarrhea, nausea and vomiting.  Endocrine: Negative for polydipsia and polyuria.  Genitourinary:  Negative for dysuria and hematuria.  Musculoskeletal:  Positive for back pain and neck pain. Negative for neck  stiffness.  Skin:  Positive for color change (Left foot 2nd and 3rd toes) and rash.  Neurological:  Positive for weakness, light-headedness and numbness. Negative for headaches.  Psychiatric/Behavioral:  Negative for agitation and behavioral problems.        Objective:    Physical Exam Vitals reviewed.  Constitutional:      General: He is not in acute distress.    Appearance: He is not diaphoretic.     Comments: Has a cane  HENT:     Head: Normocephalic and atraumatic.     Nose: Nose normal.     Mouth/Throat:     Mouth: Mucous membranes are moist.  Eyes:     General: No scleral icterus.    Extraocular Movements: Extraocular movements intact.  Cardiovascular:     Rate and Rhythm: Normal rate and regular rhythm.     Heart sounds: Normal heart sounds. No murmur heard.    Comments: DPA pulse on left foot 1+ Pulmonary:     Breath sounds: Normal breath sounds. No wheezing or rales.  Musculoskeletal:        General: Tenderness present.     Cervical back: Neck supple. No tenderness.     Right lower leg: No edema.     Left lower leg: No edema.  Skin:    General: Skin is warm.     Findings: Rash (Brownish patch over left forearm area, about 1 cm in diameter) present.     Comments: Purpleish patchy discoloration of left 2nd and 3rd toes  Neurological:     General: No focal deficit present.     Mental Status: He is alert and oriented to person, place, and time.     Sensory: Sensory deficit (LLE) present.     Motor: Weakness (4/5 in LLE) present.  Psychiatric:        Mood and Affect: Mood normal.        Behavior: Behavior normal.     BP 129/67   Pulse 70   Ht 6\' 2"  (1.88 m)   Wt 187 lb 9.6 oz (85.1 kg)   SpO2 98%   BMI 24.09 kg/m  Wt Readings from Last 3 Encounters:  11/04/23 187 lb 9.6 oz (85.1 kg)  10/23/23 185 lb 9.6 oz (84.2 kg)  07/19/23 194 lb 12.8 oz (88.4 kg)        Assessment & Plan:   Problem List Items Addressed This Visit       Cardiovascular and  Mediastinum   PAD (peripheral artery disease) (HCC) - Primary   Left foot 2nd and 3rd toe cyanosis likely due to PAD Continue aspirin  81 mg QD for now Check US  ABI screening -will need vascular surgery evaluation based on finding Would prefer to start a statin as well, but patient is hesitant for statin for now      Relevant Orders   US  ARTERIAL ABI (SCREENING LOWER EXTREMITY)     Musculoskeletal and Integument   Bunion of right foot   Chronic right foot pain, referred to podiatry      Relevant Orders   Ambulatory  referral to Podiatry     No orders of the defined types were placed in this encounter.    Meldon Sport, MD

## 2023-11-04 NOTE — Patient Instructions (Addendum)
 Please continue taking Aspirin  81 mg once daily.  Please get US  ABI test done as scheduled.

## 2023-11-05 DIAGNOSIS — M21611 Bunion of right foot: Secondary | ICD-10-CM | POA: Insufficient documentation

## 2023-11-05 NOTE — Assessment & Plan Note (Signed)
 Left foot 2nd and 3rd toe cyanosis likely due to PAD Continue aspirin  81 mg QD for now Check US  ABI screening -will need vascular surgery evaluation based on finding Would prefer to start a statin as well, but patient is hesitant for statin for now

## 2023-11-05 NOTE — Assessment & Plan Note (Signed)
 Chronic right foot pain, referred to podiatry

## 2023-11-07 ENCOUNTER — Telehealth: Payer: Self-pay | Admitting: Medical Oncology

## 2023-11-07 NOTE — Telephone Encounter (Signed)
 New pt -Pt wants to be seen at [facility] for his annual f/u . He is transferring his care from [facility] to  [facility]. as a new pt . I told him I will ask  Belal Scallon Wilson , RN at [facility] to contact pt.

## 2023-11-12 NOTE — Progress Notes (Signed)
 [facility] [address], [place] [address]   Clinic Day:  11/13/2023  Referring physician: Meldon Sport, MD  Patient Care Team: Meldon Sport, MD as PCP - General (Internal Medicine) Millicent Ally, MD as PCP - Cardiology (Cardiology) Devon Fogo, MD (Inactive) as Consulting Physician (Dermatology)   ASSESSMENT & PLAN:   Assessment:  1.  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  Colonic mass biopsy invasive moderately differentiated adenocarcinoma. - CT CAP (09/29/2022): No evidence of lymphadenopathy or metastatic disease in the chest, abdomen and pelvis. - CEA (09/29/2022): 2.1 - Right hemicolectomy on 11/08/2022 by Dr. Andy Bannister - Pathology: Invasive moderately differentiated adenocarcinoma, 2.5 cm, focally extending into pericolonic connective tissue (pT3), margins negative, 0/16 lymph nodes involved.  Negative for LVI/PNI.  Loss of nuclear expression of PMS2.  2.  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  Social/family history: - His wife passed away in 01/30/2025from ovarian cancer.  He lives at home by himself and is independent of ADLs and IADLs.  Walks about 1 and half mile per day.  Smoked half pack cigarettes from 1967-76 and quit.  No prior asbestos exposure.  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 - No family history of malignancies.  Plan:  1.  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 - MMR testing showed loss of nuclear expression of PMS2.  Recommend MSI testing and genetics evaluation.   Also talked to him about Signatera testing.  He is interested. - Will check CEA, LFTs. - Will order CTAP with contrast and return to clinic in 2 to 3 weeks.  2.  Left lung adenocarcinoma: - Will obtain CT chest for lung cancer surveillance.  3.  Microcytic anemia: - Labs from August 15, 2023 showed hemoglobin 11.4 with MCV 75. - He reports taking iron  infusion tablet every day since then. - Will check ferritin and iron  panel today.   Orders Placed This Encounter  Procedures   CT CHEST ABDOMEN PELVIS W CONTRAST    Standing Status:   Future    Expected Date:   11/27/2023    Expiration Date:   11/12/2024    If indicated for the ordered procedure, I authorize the administration of contrast media per Radiology protocol:   Yes    Does the patient have a contrast media/X-ray dye allergy?:   No    Preferred imaging location?:   [facility]    Release to patient:   Immediate    If indicated for the ordered procedure, I authorize the administration of oral contrast media per Radiology protocol:   Yes   Genetic Screening Order    Standing Status:   Future    Number of Occurrences:   1    Expected Date:   11/13/2023    Expiration Date:   11/12/2024   CEA    Standing Status:   Future    Number of Occurrences:   1    Expected Date:   11/13/2023    Expiration Date:   11/12/2024   Signatera    Select as applicable. If patient is on or planning to receive immunotherapy, select  drug: Not on Immunotherapy If "Other or Multiple", Write down drug name:  Do not Delete Below This Line   ==========Department Information========== ID: [id number] Department:[facility] [place] - A DEPT [facility] [address] [place] [address] Dept: [phone] Dept Fax: [phone]    Standing Status:   Future    Number of Occurrences:   1    Expected Date:   11/13/2023    Expiration Date:   11/12/2024    Linard Reno to follow up with patient for  sample collection (mobile phleb, lab, or saliva)::   No    Surveillance Program draw frequency::   Every 3 Months    Surveillance Program draw count::   4    Cancer type::   Colon    Stage of diagnosis::   II    Current disease status::   No evidence of disease    Is the patient receiving or planning to receive immunotherapy?:   No    Patient status::   Outpatient    By placing this electronic order I confirm the testing ordered herein is medically necessary and this patient has been informed of the details of the genetic test(s) ordered, including the risks, benefits, and alternatives, and has consented to testing.:   Yes    What type of billing?:   Bill Insurance   CBC with Differential    Standing Status:   Future    Number of Occurrences:   1    Expected Date:   11/13/2023    Expiration Date:   11/12/2024   Comprehensive metabolic panel    Standing Status:   Future    Number of Occurrences:   1    Expected Date:   11/13/2023    Expiration Date:   11/12/2024   Iron  and TIBC ([facility] ONLY)    Standing Status:   Future    Number of Occurrences:   1    Expected Date:   11/13/2023    Expiration Date:   11/12/2024   Ferritin    Standing Status:   Future    Number of Occurrences:   1    Expected Date:   11/13/2023    Expiration Date:   11/12/2024      I,Katie Daubenspeck,acting as a scribe for Paulett Boros, MD.,have documented all relevant documentation on the behalf of Paulett Boros, MD,as directed by  Paulett Boros, MD while in the presence of Paulett Boros, MD.   I, Paulett Boros MD, have reviewed the above documentation for accuracy and completeness, and I agree with the above.   Paulett Boros, MD   4/30/20258:58 AM  CHIEF COMPLAINT/PURPOSE OF CONSULT:   Diagnosis: history of colon cancer and lung adenocarcinoma    Cancer Staging  Cancer of right colon [facility]) Staging form: Colon and Rectum, AJCC 8th Edition - Clinical  stage from 11/12/2023: Stage IIA (cT3, cN0, cM0) - Signed by Paulett Boros, MD on 11/12/2023    Prior Therapy: Lung cancer: LLL lobectomy, 09/04/21   Colon cancer: partial colectomy, 11/08/22   Current Therapy:  surveillance    HISTORY OF PRESENT ILLNESS:   Oncology History  Cancer of right colon (HCC)  11/08/2022 Initial Diagnosis   Cancer of right colon (HCC)   11/12/2023 Cancer Staging   Staging form: Colon and Rectum, AJCC 8th Edition - Clinical stage from 11/12/2023: Stage IIA (cT3, cN0, cM0) - Signed by Paulett Boros, MD on 11/12/2023 Histopathologic type:  Adenocarcinoma, NOS Stage prefix: Initial diagnosis Total positive nodes: 0 Total nodes examined: 16 Histologic grade (G): G2 Histologic grading system: 4 grade system Laterality: Right Tumor size (mm): 25 Lymph-vascular invasion (LVI): LVI not present (absent)/not identified Specimen type: Excision Tumor deposits (TD): Absent Carcinoembryonic antigen (CEA) (ng/mL): 2.1       Russell [name] is a 79 y.o. male presenting to clinic today for evaluation of lung and colon cancers at the request of Dr. Odette Benjamin.  Lung cancer: He was incidentally noted to have a lung nodule on spine MRI from 07/03/20 (performed at [facility]). He was placed on surveillance. Chest CT on 03/21/21 showed mild increase in size of 2.7 cm LLL pulmonary nodule. Bronchoscopy on 05/16/21 confirmed adenocarcinoma. He underwent LLL lobectomy on 09/04/21, path showing invasive mucinous adenocarcinoma, 2.8 cm, negative margins and lymph nodes. He has continued on surveillance. Most recent chest CT from 05/03/23 showed no definite evidence for recurrence or metastasis.   Colon cancer: He was initially referred to GI back in 10/2021 for hemorrhoids. He later developed anemia and required hospital admission on 09/26/22. Colonoscopy performed inpatient on 09/28/22 showed a mass in proximal ascending colon, which pathology confirmed to be adenocarcinoma. He was taken for  resection on 11/08/22, final path showing invasive moderately differentiated colonic adenocarcinoma, 2.5 cm, focally extending into pericolonic connective tissue, negative margins and lymph nodes. He is due for surveillance CT per last note with Dr. Odette Benjamin.  Today, he states that he is doing well overall. His appetite level is at 50%. His energy level is at 50%.  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   Colonic mass 09/29/2022   Constipation    Encounter for general adult medical examination with abnormal findings 06/27/2022   Enlarged prostate    GERD (gastroesophageal reflux disease)    Hemorrhoids    History of kidney stones    passed stones   History of lumbar laminectomy 05/12/2020   open L2-L5 at [facility]   Hx of colonic polyp 05/29/2018   05/2018 cecal ssp   Lung nodule 02/2021   Lyme disease    Maxillary sinus polyp    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   Mixed hyperlipidemia 07/19/2023   Pneumonia    Prolapsed internal hemorrhoids, grade 3 09/10/2006   Qualifier: Diagnosis of  By: Nana Avers MD, Cornelius     Salivary gland enlargement    Sleep apnea 10 years ago   declined CPAP   Stroke (HCC) 15-20 years ago    Surgical History: Past Surgical History:  Procedure Laterality Date   BIOPSY  09/28/2022   Procedure: BIOPSY;  Surgeon: Albertina Hugger, MD;  Location: [facility];  Service: Gastroenterology;;   BRONCHIAL BIOPSY  05/16/2021   Procedure: BRONCHIAL BIOPSIES;  Surgeon: Prudy Brownie, DO;  Location: [name];  Service: Pulmonary;;   BRONCHIAL  BRUSHINGS  05/16/2021   Procedure: BRONCHIAL BRUSHINGS;  Surgeon: Prudy Brownie, DO;  Location: [name];  Service: Pulmonary;;   BRONCHIAL NEEDLE ASPIRATION BIOPSY  05/16/2021   Procedure: BRONCHIAL NEEDLE ASPIRATION BIOPSIES;  Surgeon: Prudy Brownie, DO;  Location: [name];  Service: Pulmonary;;   CERVICAL SPINE SURGERY  04/10/2020   C3-C4 with pin at [name]   COLONOSCOPY     COLONOSCOPY WITH PROPOFOL  N/A 09/28/2022   Procedure: COLONOSCOPY WITH PROPOFOL ;  Surgeon: Albertina Hugger, MD;  Location: [facility] ENDOSCOPY;  Service: Gastroenterology;  Laterality: N/A;   ESOPHAGOGASTRODUODENOSCOPY (EGD) WITH PROPOFOL  N/A 09/28/2022   Procedure: ESOPHAGOGASTRODUODENOSCOPY (EGD) WITH PROPOFOL ;  Surgeon: Albertina Hugger, MD;  Location: [facility] ENDOSCOPY;  Service: Gastroenterology;  Laterality: N/A;   FIDUCIAL MARKER PLACEMENT  05/16/2021   Procedure: FIDUCIAL MARKER PLACEMENT;  Surgeon: Prudy Brownie, DO;  Location: [name];  Service: Pulmonary;;   HEMORRHOID BANDING     LUMBAR LAMINECTOMY  03/08/2010   L2-3, cauda equina syndrome   lumbar laminectomy  05/12/2020   [facility]   SPINE SURGERY     [facility] ENDOSCOPY     VIDEO BRONCHOSCOPY WITH ENDOBRONCHIAL NAVIGATION Left 05/16/2021   Procedure: VIDEO BRONCHOSCOPY WITH ENDOBRONCHIAL NAVIGATION;  Surgeon: Prudy Brownie, DO;  Location: [name];  Service: Pulmonary;  Laterality: Left;  ION w/ fiducial placement   VIDEO BRONCHOSCOPY WITH RADIAL ENDOBRONCHIAL ULTRASOUND  05/16/2021   Procedure: VIDEO BRONCHOSCOPY WITH RADIAL ENDOBRONCHIAL ULTRASOUND;  Surgeon: Prudy Brownie, DO;  Location: [name];  Service: Pulmonary;;   wisdom teeth ext      Social History: Social History   Socioeconomic History   Marital status: Widowed    Spouse name: Not on file   Number of children: 0   Years of education: Not on file   Highest education level: Master's degree (e.g., MA, MS, MEng, MEd, MSW, MBA)  Occupational History   Occupation:  retired  Tobacco Use   Smoking status: Former    Current packs/day: 0.00    Average packs/day: 0.3 packs/day for 9.0 years (2.3 ttl pk-yrs)    Types: Cigarettes    Start date: 10/15/1965    Quit date: 10/16/1974    Years since quitting: 49.1    Passive exposure: Past   Smokeless tobacco: Never  Vaping Use   Vaping status: Never Used  Substance and Sexual Activity   Alcohol use: Not Currently   Drug use: No   Sexual activity: Not Currently  Other Topics Concern   Not on file  Social History Narrative   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   Social Drivers of Corporate investment banker Strain: Low Risk  (11/13/2023)   Overall Financial Resource Strain (CARDIA)    Difficulty of Paying Living Expenses: Not hard at all  Food Insecurity: No Food Insecurity (11/13/2023)   Hunger Vital Sign    Worried About Running Out of Food in the Last Year: Never true    Ran Out of Food in the Last Year: Never true  Transportation Needs: No Transportation Needs (11/13/2023)   PRAPARE - Administrator, Civil Service (Medical): No    Lack of Transportation (Non-Medical): No  Physical Activity: Insufficiently Active (11/13/2023)   Exercise Vital Sign    Days of Exercise per Week: 1 day    Minutes of Exercise per Session: 10 min  Stress: No Stress Concern Present (  11/13/2023)   [place] [employer] - Occupational Stress Questionnaire    Feeling of Stress : Not at all  Social Connections: Moderately Isolated (11/03/2023)   Social Connection and Isolation Panel [NHANES]    Frequency of Communication with Friends and Family: More than three times a week    Frequency of Social Gatherings with Friends and Family: Twice a week    Attends Religious Services: More than 4 times per year    Active  Member of [employer] or Organizations: No    Attends Banker Meetings: Never    Marital Status: Widowed  Intimate Partner Violence: Not At Risk (11/13/2023)   Humiliation, Afraid, Rape, and Kick questionnaire    Fear of Current or Ex-Partner: No    Emotionally Abused: No    Physically Abused: No    Sexually Abused: No    Family History: Family History  Problem Relation Age of Onset   Aortic aneurysm Mother    Varicose Veins Mother    Heart attack Father    Arthritis Father    Aortic aneurysm Sister    Early death Sister    CVA Sister    Aortic aneurysm Sister    Aortic aneurysm Sister    Colon cancer Neg Hx    Pancreatic cancer Neg Hx    Rectal cancer Neg Hx    Stomach cancer Neg Hx    Esophageal cancer Neg Hx    Colon polyps Neg Hx     Current Medications:  Current Outpatient Medications:    aspirin  EC 81 MG tablet, Take 1 tablet (81 mg total) by mouth every evening., Disp: 30 tablet, Rfl: 12   finasteride  (PROSCAR ) 5 MG tablet, Take 1 tablet (5 mg total) by mouth daily., Disp: 90 tablet, Rfl: 3   Ginger, Zingiber officinalis, (GINGER PO), Take by mouth., Disp: , Rfl:    iron  polysaccharides (NIFEREX) 150 MG capsule, Take 1 capsule (150 mg total) by mouth daily. (Patient taking differently: Take 150 mg by mouth every evening.), Disp: 30 capsule, Rfl: 1   Magnesium 400 MG CAPS, Take 400 mg by mouth 2 (two) times daily. asporotate, Disp: , Rfl:    melatonin 5 MG TABS, Take 5 mg by mouth at bedtime., Disp: , Rfl:    methylcellulose (CITRUCEL) oral powder, Take by mouth., Disp: , Rfl:    metoprolol  succinate (TOPROL -XL) 25 MG 24 hr tablet, Take 1 tablet (25 mg total) by mouth daily., Disp: 90 tablet, Rfl: 3   Multiple Vitamin (MULTIVITAMIN) tablet, Take 1 tablet by mouth daily., Disp: , Rfl:    Omega-3 Fatty Acids (FISH OIL PO), Take 460 mg by mouth 2 (two) times daily., Disp: , Rfl:    omeprazole  (PRILOSEC) 40 MG capsule, TAKE (1) CAPSULE BY MOUTH ONCE DAILY., Disp:  90 capsule, Rfl: 3   silodosin  (RAPAFLO ) 8 MG CAPS capsule, Take 1 capsule (8 mg total) by mouth 2 (two) times daily., Disp: 60 capsule, Rfl: 11   TART CHERRY PO, Take by mouth., Disp: , Rfl:    TURMERIC PO, Take 500 mg by mouth 2 (two) times daily. curamed curcumin, Disp: , Rfl:    Allergies: No Known Allergies  REVIEW OF SYSTEMS:   Review of Systems  All other systems reviewed and are negative.    VITALS:   Blood pressure 123/73, pulse (!) 53, temperature (!) 96.9 F (36.1 C), temperature source Tympanic, resp. rate 18, height 5' 11.65" (1.82 m), weight 186 lb 4.6 oz (84.5 kg), SpO2  100%.  Wt Readings from Last 3 Encounters:  11/13/23 186 lb 4.6 oz (84.5 kg)  11/04/23 187 lb 9.6 oz (85.1 kg)  10/23/23 185 lb 9.6 oz (84.2 kg)    Body mass index is 25.51 kg/m.  Performance status (ECOG): 1 - Symptomatic but completely ambulatory  PHYSICAL EXAM:   Physical Exam Vitals reviewed.  Constitutional:      Appearance: Normal appearance.  Cardiovascular:     Rate and Rhythm: Normal rate and regular rhythm.     Heart sounds: Normal heart sounds.  Pulmonary:     Effort: Pulmonary effort is normal.     Breath sounds: Normal breath sounds.  Abdominal:     General: Bowel sounds are normal.     Palpations: Abdomen is soft.  Neurological:     General: No focal deficit present.     Mental Status: He is alert and oriented to person, place, and time.  Psychiatric:        Mood and Affect: Mood normal.        Behavior: Behavior normal.     LABS:   CBC    Component Value Date/Time   WBC 6.0 07/19/2023 1031   WBC 10.2 11/10/2022 0458   RBC 5.05 07/19/2023 1031   RBC 3.75 (L) 11/10/2022 0458   HGB 11.4 (L) 07/19/2023 1031   HCT 38.0 07/19/2023 1031   PLT 219 07/19/2023 1031   MCV 75 (L) 07/19/2023 1031   MCH 22.6 (L) 07/19/2023 1031   MCH 21.1 (L) 11/10/2022 0458   MCHC 30.0 (L) 07/19/2023 1031   MCHC 30.3 11/10/2022 0458   RDW 18.2 (H) 07/19/2023 1031   LYMPHSABS 1.1  07/19/2023 1031   MONOABS 0.6 09/30/2022 0536   EOSABS 0.1 07/19/2023 1031   BASOSABS 0.0 07/19/2023 1031    CMP    Component Value Date/Time   NA 140 07/19/2023 1031   K 4.4 07/19/2023 1031   CL 103 07/19/2023 1031   CO2 23 07/19/2023 1031   GLUCOSE 99 07/19/2023 1031   GLUCOSE 104 (H) 11/10/2022 0458   BUN 12 07/19/2023 1031   CREATININE 0.77 07/19/2023 1031   CALCIUM 8.9 07/19/2023 1031   PROT 6.6 07/19/2023 1031   ALBUMIN  4.2 07/19/2023 1031   AST 25 07/19/2023 1031   ALT 17 07/19/2023 1031   ALKPHOS 49 07/19/2023 1031   BILITOT 0.5 07/19/2023 1031   GFRNONAA >60 11/10/2022 0458   GFRAA >60 06/13/2018 1020     Lab Results  Component Value Date   CEA1 2.1 09/29/2022   /  CEA  Date Value Ref Range Status  09/29/2022 2.1 0.0 - 4.7 ng/mL Final    Comment:    (NOTE)                             Nonsmokers          <3.9                             Smokers             <5.6 Roche Diagnostics Electrochemiluminescence Immunoassay (ECLIA) Values obtained with different assay methods or kits cannot be used interchangeably.  Results cannot be interpreted as absolute evidence of the presence or absence of malignant disease. Performed At: [facility] [address] [place], [place] [id number] Pearlean Botts MD WU:[id number]    Lab Results  Component Value Date  PSA1 1.1 04/24/2023   No results found for: "ZOX096" No results found for: "CAN125"  No results found for: "TOTALPROTELP", "ALBUMINELP", "A1GS", "A2GS", "BETS", "BETA2SER", "GAMS", "MSPIKE", "SPEI" Lab Results  Component Value Date   TIBC 396 09/26/2022   FERRITIN 115 10/08/2022   FERRITIN 6 (L) 09/26/2022   FERRITIN 167.4 05/09/2018   IRONPCTSAT 3 (L) 09/26/2022   No results found for: "LDH"   STUDIES:   No results found.

## 2023-11-13 ENCOUNTER — Inpatient Hospital Stay

## 2023-11-13 ENCOUNTER — Encounter: Payer: Self-pay | Admitting: Hematology

## 2023-11-13 ENCOUNTER — Inpatient Hospital Stay: Attending: Hematology | Admitting: Hematology

## 2023-11-13 VITALS — BP 123/73 | HR 53 | Temp 96.9°F | Resp 18 | Ht 71.65 in | Wt 186.3 lb

## 2023-11-13 DIAGNOSIS — C182 Malignant neoplasm of ascending colon: Secondary | ICD-10-CM | POA: Insufficient documentation

## 2023-11-13 DIAGNOSIS — Z87891 Personal history of nicotine dependence: Secondary | ICD-10-CM | POA: Diagnosis not present

## 2023-11-13 DIAGNOSIS — D509 Iron deficiency anemia, unspecified: Secondary | ICD-10-CM | POA: Diagnosis not present

## 2023-11-13 DIAGNOSIS — Z79899 Other long term (current) drug therapy: Secondary | ICD-10-CM | POA: Insufficient documentation

## 2023-11-13 DIAGNOSIS — C189 Malignant neoplasm of colon, unspecified: Secondary | ICD-10-CM

## 2023-11-13 DIAGNOSIS — C3490 Malignant neoplasm of unspecified part of unspecified bronchus or lung: Secondary | ICD-10-CM | POA: Diagnosis not present

## 2023-11-13 DIAGNOSIS — Z7982 Long term (current) use of aspirin: Secondary | ICD-10-CM | POA: Insufficient documentation

## 2023-11-13 DIAGNOSIS — Z85118 Personal history of other malignant neoplasm of bronchus and lung: Secondary | ICD-10-CM | POA: Diagnosis not present

## 2023-11-13 LAB — COMPREHENSIVE METABOLIC PANEL WITH GFR
ALT: 18 U/L (ref 0–44)
AST: 24 U/L (ref 15–41)
Albumin: 4 g/dL (ref 3.5–5.0)
Alkaline Phosphatase: 44 U/L (ref 38–126)
Anion gap: 8 (ref 5–15)
BUN: 17 mg/dL (ref 8–23)
CO2: 27 mmol/L (ref 22–32)
Calcium: 9.2 mg/dL (ref 8.9–10.3)
Chloride: 102 mmol/L (ref 98–111)
Creatinine, Ser: 0.66 mg/dL (ref 0.61–1.24)
GFR, Estimated: >60 mL/min (ref >60)
Glucose, Bld: 112 mg/dL — ABNORMAL HIGH (ref 70–99)
Potassium: 4.3 mmol/L (ref 3.5–5.1)
Sodium: 137 mmol/L (ref 135–145)
Total Bilirubin: 1.1 mg/dL (ref 0.0–1.2)
Total Protein: 7.3 g/dL (ref 6.5–8.1)

## 2023-11-13 LAB — CBC WITH DIFFERENTIAL/PLATELET
Abs Immature Granulocytes: 0.01 K/uL (ref 0.00–0.07)
Basophils Absolute: 0 K/uL (ref 0.0–0.1)
Basophils Relative: 1 %
Eosinophils Absolute: 0.1 K/uL (ref 0.0–0.5)
Eosinophils Relative: 2 %
HCT: 38.9 % — ABNORMAL LOW (ref 39.0–52.0)
Hemoglobin: 12.4 g/dL — ABNORMAL LOW (ref 13.0–17.0)
Immature Granulocytes: 0 %
Lymphocytes Relative: 24 %
Lymphs Abs: 1.2 K/uL (ref 0.7–4.0)
MCH: 24.5 pg — ABNORMAL LOW (ref 26.0–34.0)
MCHC: 31.9 g/dL (ref 30.0–36.0)
MCV: 76.9 fL — ABNORMAL LOW (ref 80.0–100.0)
Monocytes Absolute: 0.6 K/uL (ref 0.1–1.0)
Monocytes Relative: 11 %
Neutro Abs: 3.3 K/uL (ref 1.7–7.7)
Neutrophils Relative %: 62 %
Platelets: 235 K/uL (ref 150–400)
RBC: 5.06 MIL/uL (ref 4.22–5.81)
RDW: 16.1 % — ABNORMAL HIGH (ref 11.5–15.5)
WBC: 5.2 K/uL (ref 4.0–10.5)
nRBC: 0 % (ref 0.0–0.2)

## 2023-11-13 LAB — IRON AND TIBC
Iron: 104 ug/dL (ref 45–182)
Saturation Ratios: 28 % (ref 17.9–39.5)
TIBC: 371 ug/dL (ref 250–450)
UIBC: 267 ug/dL

## 2023-11-13 LAB — GENETIC SCREENING ORDER

## 2023-11-13 LAB — FERRITIN: Ferritin: 14 ng/mL — ABNORMAL LOW (ref 24–336)

## 2023-11-13 NOTE — Addendum Note (Signed)
 Addended by: Christal Court on: 11/13/2023 10:00 AM   Modules accepted: Orders

## 2023-11-13 NOTE — Addendum Note (Signed)
 Addended by: Alen Amy on: 11/13/2023 09:23 AM   Modules accepted: Orders

## 2023-11-13 NOTE — Patient Instructions (Addendum)
 You were seen and examined today by Dr. Cheree Cords. Dr. Katragadda is a medical oncologist, meaning that he specializes in the treatment of cancer diagnoses. Dr. Cheree Cords discussed your past medical history, family history of cancers, and the events that led to you being here today.

## 2023-11-14 ENCOUNTER — Ambulatory Visit: Admitting: Physician Assistant

## 2023-11-14 ENCOUNTER — Other Ambulatory Visit

## 2023-11-14 ENCOUNTER — Telehealth: Payer: Self-pay | Admitting: Dietician

## 2023-11-14 ENCOUNTER — Inpatient Hospital Stay: Attending: Hematology | Admitting: Dietician

## 2023-11-14 DIAGNOSIS — J8410 Pulmonary fibrosis, unspecified: Secondary | ICD-10-CM | POA: Insufficient documentation

## 2023-11-14 DIAGNOSIS — D509 Iron deficiency anemia, unspecified: Secondary | ICD-10-CM | POA: Insufficient documentation

## 2023-11-14 DIAGNOSIS — N400 Enlarged prostate without lower urinary tract symptoms: Secondary | ICD-10-CM | POA: Insufficient documentation

## 2023-11-14 DIAGNOSIS — C3432 Malignant neoplasm of lower lobe, left bronchus or lung: Secondary | ICD-10-CM | POA: Insufficient documentation

## 2023-11-14 DIAGNOSIS — Z8719 Personal history of other diseases of the digestive system: Secondary | ICD-10-CM | POA: Insufficient documentation

## 2023-11-14 DIAGNOSIS — Z823 Family history of stroke: Secondary | ICD-10-CM | POA: Insufficient documentation

## 2023-11-14 DIAGNOSIS — Z8673 Personal history of transient ischemic attack (TIA), and cerebral infarction without residual deficits: Secondary | ICD-10-CM | POA: Insufficient documentation

## 2023-11-14 DIAGNOSIS — Z87442 Personal history of urinary calculi: Secondary | ICD-10-CM | POA: Insufficient documentation

## 2023-11-14 DIAGNOSIS — C182 Malignant neoplasm of ascending colon: Secondary | ICD-10-CM | POA: Insufficient documentation

## 2023-11-14 DIAGNOSIS — Z8261 Family history of arthritis: Secondary | ICD-10-CM | POA: Insufficient documentation

## 2023-11-14 DIAGNOSIS — K649 Unspecified hemorrhoids: Secondary | ICD-10-CM | POA: Insufficient documentation

## 2023-11-14 DIAGNOSIS — Z87891 Personal history of nicotine dependence: Secondary | ICD-10-CM | POA: Insufficient documentation

## 2023-11-14 DIAGNOSIS — Z79899 Other long term (current) drug therapy: Secondary | ICD-10-CM | POA: Insufficient documentation

## 2023-11-14 DIAGNOSIS — Z860100 Personal history of colon polyps, unspecified: Secondary | ICD-10-CM | POA: Insufficient documentation

## 2023-11-14 DIAGNOSIS — Z8249 Family history of ischemic heart disease and other diseases of the circulatory system: Secondary | ICD-10-CM | POA: Insufficient documentation

## 2023-11-14 LAB — CEA: CEA: 3.5 ng/mL (ref 0.0–4.7)

## 2023-11-14 NOTE — Telephone Encounter (Signed)
 Nutrition Assessment   Reason for Assessment: Referral (poor appetite)  ASSESSMENT: 78 year old male with history of lung and colon cancer. Pt transferring care from [facility]. He is currently under surveillance. Patient is under the care of Dr. Cheree Cords  Past medical history includes prolapsed internal hemorrhoids, PAD, ED, superficial vein thrombosis, sleep apnea, GERD, neurogenic bowel, cauda equina syndrome, cervical spondylosis, psoriasis, DDD, osteoarthritis of both feet, lyme disease, BPH, IDA, h/o lung cancer s/p left lower lobectomy 08/2021, h/o colon cancer s/p right hemicolectomy 01/2023  Spoke with patient via telephone. Introduced self and services available at [facility]. Patient appreciative of call. He reports appetite is slowly improving. Pt shares he was the primary caregiver for his wife who passed in January. He reports eating less with wife's decline in appetite. Pt reports doing better recently. He has a good family/friend/neighbor support system. Pt tries to eat 3 meals and attempts to get protein at every meal. Breakfast is challenging. Reports eating bowl of cereal or bagel with cream cheese. Has a sandwich (pimento cheese, chicken salad) with chips for lunch. Recalls variety of foods for dinner (chunky campbell's soups, hamburgers, sausages, chicken tenders, instant potatoes, frozen vegetables. Pt would like to start preparing his own protein shakes. He has frozen berries and bananas to incorporate. Patient is staying active. He enjoys hiking on ~1 mile horse [place] trail on [place] property.    Nutrition Focused Physical Exam: deferred (telephone visit)   Medications: proscar , niferex, mg, melatonin, citrucel, MVI, prilosec, rapaflo , tart cherry, tumeric   Labs: glucose 112   Anthropometrics:   Height: 5'11.65" Weight: 186 lb 4.6 oz  UBW: 194 lb (07/19/23) BMI: 25.51    NUTRITION DIAGNOSIS: Unintended wt loss related to social/environmental circumstances as evidenced by pt  report, 4% wt loss in 3 months which is insignificant for time frame   INTERVENTION:  Educated on sources of protein, recommend protein foods at every meal Discussed strategies for adding calories and protein to foods Offered ideas for protein smoothies - will mail recipes Continue activity as able - encourage high protein snack following exercise - will mail ideas Contact information to be mailed    MONITORING, EVALUATION, GOAL: Pt will tolerate adequate calories and protein to promote stable weight   Next Visit: To be scheduled as needed - pt encouraged to contact with nutrition questions/concerns

## 2023-11-15 ENCOUNTER — Ambulatory Visit: Admitting: Podiatry

## 2023-11-18 ENCOUNTER — Ambulatory Visit (HOSPITAL_COMMUNITY)
Admission: RE | Admit: 2023-11-18 | Discharge: 2023-11-18 | Disposition: A | Discharge status: Home / Self Care | Source: Ambulatory Visit | Attending: Internal Medicine | Admitting: Internal Medicine

## 2023-11-18 DIAGNOSIS — I739 Peripheral vascular disease, unspecified: Secondary | ICD-10-CM | POA: Diagnosis not present

## 2023-11-18 DIAGNOSIS — M7989 Other specified soft tissue disorders: Secondary | ICD-10-CM | POA: Diagnosis not present

## 2023-11-26 ENCOUNTER — Ambulatory Visit (HOSPITAL_BASED_OUTPATIENT_CLINIC_OR_DEPARTMENT_OTHER)
Admission: RE | Admit: 2023-11-26 | Discharge: 2023-11-26 | Disposition: A | Discharge status: Home / Self Care | Source: Ambulatory Visit | Attending: Hematology | Admitting: Hematology

## 2023-11-26 DIAGNOSIS — Z85118 Personal history of other malignant neoplasm of bronchus and lung: Secondary | ICD-10-CM | POA: Diagnosis not present

## 2023-11-26 DIAGNOSIS — C3490 Malignant neoplasm of unspecified part of unspecified bronchus or lung: Secondary | ICD-10-CM | POA: Insufficient documentation

## 2023-11-26 DIAGNOSIS — Z85038 Personal history of other malignant neoplasm of large intestine: Secondary | ICD-10-CM | POA: Diagnosis not present

## 2023-11-26 DIAGNOSIS — C189 Malignant neoplasm of colon, unspecified: Secondary | ICD-10-CM | POA: Insufficient documentation

## 2023-11-26 DIAGNOSIS — N400 Enlarged prostate without lower urinary tract symptoms: Secondary | ICD-10-CM | POA: Diagnosis not present

## 2023-11-26 DIAGNOSIS — J8410 Pulmonary fibrosis, unspecified: Secondary | ICD-10-CM | POA: Diagnosis not present

## 2023-11-26 MED ORDER — IOHEXOL 300 MG/ML  SOLN
100.0000 mL | Freq: Once | INTRAMUSCULAR | Status: AC | PRN
  Administered 2023-11-26: 100 mL via INTRAVENOUS

## 2023-11-27 ENCOUNTER — Inpatient Hospital Stay: Admitting: Licensed Clinical Social Worker

## 2023-11-27 DIAGNOSIS — C3490 Malignant neoplasm of unspecified part of unspecified bronchus or lung: Secondary | ICD-10-CM

## 2023-11-27 NOTE — Progress Notes (Signed)
 [facility] CSW Progress Note  Clinical Child psychotherapist contacted patient by phone to introduce self and services available.  Pt is transferring care from [facility] to [facility].  Pt has been under surveillance.  Per pt he will be undergoing additional testing as some lab work has returned which has caused some concern for possible recurrence.  Pt resides alone after losing his wife at the end of January to ovarian cancer which she had been undergoing treatment for 10 years.  Pt was diagnosed w/ stage I lung cancer in 2022 for which he completed treatment and then diagnosed w/ stage II colon cancer in March 2024 for which he also completed treatment.  Pt is a retired Runner, broadcasting/film/video and maintains an active lifestyle.  Pt has been connected to grief counseling services through the hospice agency providing care for his wife as well as the funeral home which he reports has been helpful.  Pt has two sisters he is able to reach out to if needed as well as close friends who continue to offer support and will be available should needs arise for himself.  CSW provided pt w/ contact information should needs arise in the future.      Quenton Bruns, LCSW Clinical Social Worker [facility]

## 2023-12-03 ENCOUNTER — Encounter: Payer: Self-pay | Admitting: Licensed Clinical Social Worker

## 2023-12-03 ENCOUNTER — Inpatient Hospital Stay: Admitting: Hematology

## 2023-12-03 VITALS — BP 125/77 | HR 55 | Temp 98.2°F | Resp 18 | Wt 185.8 lb

## 2023-12-03 DIAGNOSIS — D509 Iron deficiency anemia, unspecified: Secondary | ICD-10-CM | POA: Diagnosis not present

## 2023-12-03 DIAGNOSIS — Z8249 Family history of ischemic heart disease and other diseases of the circulatory system: Secondary | ICD-10-CM | POA: Diagnosis not present

## 2023-12-03 DIAGNOSIS — Z87891 Personal history of nicotine dependence: Secondary | ICD-10-CM | POA: Diagnosis not present

## 2023-12-03 DIAGNOSIS — Z8261 Family history of arthritis: Secondary | ICD-10-CM | POA: Diagnosis not present

## 2023-12-03 DIAGNOSIS — Z8719 Personal history of other diseases of the digestive system: Secondary | ICD-10-CM | POA: Diagnosis not present

## 2023-12-03 DIAGNOSIS — Z1379 Encounter for other screening for genetic and chromosomal anomalies: Secondary | ICD-10-CM | POA: Insufficient documentation

## 2023-12-03 DIAGNOSIS — J8410 Pulmonary fibrosis, unspecified: Secondary | ICD-10-CM | POA: Diagnosis not present

## 2023-12-03 DIAGNOSIS — Z8673 Personal history of transient ischemic attack (TIA), and cerebral infarction without residual deficits: Secondary | ICD-10-CM | POA: Diagnosis not present

## 2023-12-03 DIAGNOSIS — C189 Malignant neoplasm of colon, unspecified: Secondary | ICD-10-CM

## 2023-12-03 DIAGNOSIS — C3432 Malignant neoplasm of lower lobe, left bronchus or lung: Secondary | ICD-10-CM | POA: Diagnosis not present

## 2023-12-03 DIAGNOSIS — K649 Unspecified hemorrhoids: Secondary | ICD-10-CM | POA: Diagnosis not present

## 2023-12-03 DIAGNOSIS — Z79899 Other long term (current) drug therapy: Secondary | ICD-10-CM | POA: Diagnosis not present

## 2023-12-03 DIAGNOSIS — Z87442 Personal history of urinary calculi: Secondary | ICD-10-CM | POA: Diagnosis not present

## 2023-12-03 DIAGNOSIS — Z860100 Personal history of colon polyps, unspecified: Secondary | ICD-10-CM | POA: Diagnosis not present

## 2023-12-03 DIAGNOSIS — C182 Malignant neoplasm of ascending colon: Secondary | ICD-10-CM | POA: Diagnosis not present

## 2023-12-03 DIAGNOSIS — Z1509 Genetic susceptibility to other malignant neoplasm: Secondary | ICD-10-CM | POA: Insufficient documentation

## 2023-12-03 DIAGNOSIS — N400 Enlarged prostate without lower urinary tract symptoms: Secondary | ICD-10-CM | POA: Diagnosis not present

## 2023-12-03 DIAGNOSIS — Z823 Family history of stroke: Secondary | ICD-10-CM | POA: Diagnosis not present

## 2023-12-03 NOTE — Progress Notes (Signed)
 [facility] [address], [place] [address]   Clinic Day:  12/03/2023  Referring physician: Meldon Sport, MD  Patient Care Team: Meldon Sport, MD as PCP - General (Internal Medicine) Russell Ally, MD as PCP - Cardiology (Cardiology) Russell Fogo, MD (Inactive) as Consulting Physician (Dermatology)   ASSESSMENT & PLAN:   Assessment:  1.  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  Colonic mass biopsy invasive moderately differentiated adenocarcinoma. - CT CAP (09/29/2022): No evidence of lymphadenopathy or metastatic disease in the chest, abdomen and pelvis. - CEA (09/29/2022): 2.1 - Right hemicolectomy on 11/08/2022 by Dr. Andy Bannister - Pathology: Invasive moderately differentiated adenocarcinoma, 2.5 cm, focally extending into pericolonic connective tissue (pT3), margins negative, 0/16 lymph nodes involved.  Negative for LVI/PNI.  Loss of nuclear expression of PMS2.  2.  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  Social/family history: - His wife passed away in 01-13-2025from ovarian cancer.  He lives at home by himself and is independent of ADLs and IADLs.  Walks about 1 and half mile per day.  Smoked half pack cigarettes from 1967-76 and quit.  No prior asbestos exposure.  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 - No family history of malignancies.  Plan:  1.  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  Other benign findings discussed. - Reviewed labs from 11/13/2023: CEA 3.5.   CBC grossly normal.  LFTs are normal.  Creatinine was normal. - MSI testing on pathology and Signatera testing results are pending. 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  We also discussed increased risk of colorectal cancer, bladder, biliary tract, brain, possibly prostate, small bowel and pancreas.  Estimated lifetime risk is 9-20% for colorectal cancer, up to 3.7% of urinary tract cancer, up to 2.4% bladder cancer and up to 1% risk of biliary tract cancer and 1% risk of brain/no systemic cancer. - I have recommended that he inform his sisters to have them tested as well as the male members are at increased risk of uterine/endometrial cancer and ovarian cancer. - Recommend that he undergo colonoscopy as it has been 1 year since diagnosis. - He is already on aspirin  81 mg daily. - He last had EGD in March 2024.  Consider repeating EGD every 2 to 4 years based on symptoms.  There is insufficient evidence for screening of pancreatic and urinary tract malignancies.  He will have PSA checks routinely. - Recommend follow-up in 3 months with CEA and other labs.  He will do imaging every 6 months for the first 2 years.  He will also do Signatera test every 6 months.  2.  Left lung adenocarcinoma: - CT chest on 11/26/2023 showed postoperative appearance of left lower lobe wedge resection with no evidence of recurrence.  3.  Microcytic anemia: - He is taking iron  fusion tablet with orange juice on empty stomach. - Hemoglobin improved to 12.4 and MCV improved to 76.9.  Ferritin is low at 14.  Continue iron  tablet  daily.  Will repeat ferritin and iron  panel in 3 months.   Orders Placed This Encounter  Procedures   CBC with Differential    Standing Status:   Future    Expected Date:   02/24/2024    Expiration Date:   12/02/2024    Comprehensive metabolic panel    Standing Status:   Future    Expected Date:   02/24/2024    Expiration Date:   12/02/2024   CEA    Standing Status:   Future    Expected Date:   02/24/2024    Expiration Date:   12/02/2024   Ferritin    Standing Status:   Future    Expected Date:   02/24/2024    Expiration Date:   12/02/2024   Iron  and TIBC ([facility] ONLY)    Standing Status:   Future    Expected Date:   02/24/2024    Expiration Date:   12/02/2024      Hurman Maiden R Teague,acting as a scribe for Russell Boros, MD.,have documented all relevant documentation on the behalf of Russell Boros, MD,as directed by  Russell Boros, MD while in the presence of Russell Boros, MD.  I, Russell Boros MD, have reviewed the above documentation for accuracy and completeness, and I agree with the above.    Russell Boros, MD   5/20/20253:38 PM  CHIEF COMPLAINT/PURPOSE OF CONSULT:   Diagnosis: history of colon cancer and lung adenocarcinoma    Cancer Staging  Cancer of right colon [facility]) Staging form: Colon and Rectum, AJCC 8th Edition - Clinical stage from 11/12/2023: Stage IIA (cT3, cN0, cM0) - Signed by Russell Boros, MD on 11/12/2023    Prior Therapy: Lung cancer: LLL lobectomy, 09/04/21   Colon cancer: partial colectomy, 11/08/22   Current Therapy:  surveillance    HISTORY OF PRESENT ILLNESS:   Oncology History  Cancer of right colon (HCC)  11/08/2022 Initial Diagnosis   Cancer of right colon (HCC)   11/12/2023 Cancer Staging   Staging form: Colon and Rectum, AJCC 8th Edition - Clinical stage from 11/12/2023: Stage IIA (cT3, cN0, cM0) - Signed by Russell Boros, MD on 11/12/2023 Histopathologic type: Adenocarcinoma, NOS Stage prefix: Initial diagnosis Total positive nodes: 0 Total nodes examined: 16 Histologic grade (G): G2 Histologic grading system: 4 grade system Laterality: Right Tumor size (mm): 25 Lymph-vascular  invasion (LVI): LVI not present (absent)/not identified Specimen type: Excision Tumor deposits (TD): Absent Carcinoembryonic antigen (CEA) (ng/mL): 2.1       Russell [name] is a 78 y.o. male presenting to clinic today for evaluation of lung and colon cancers at the request of Dr. Odette Benjamin.  Lung cancer: He was incidentally noted to have a lung nodule on spine MRI from 07/03/20 (performed at [facility]). He was placed on surveillance. Chest CT on 03/21/21 showed mild increase in size of 2.7 cm LLL pulmonary nodule. Bronchoscopy on 05/16/21 confirmed adenocarcinoma. He underwent LLL lobectomy on 09/04/21, path showing invasive mucinous adenocarcinoma, 2.8 cm, negative margins and lymph nodes. He has continued on surveillance. Most recent chest CT from 05/03/23 showed no definite evidence for recurrence or metastasis.   Colon cancer: He was initially referred to GI back in 10/2021 for hemorrhoids. He later developed anemia and required hospital admission on 09/26/22. Colonoscopy performed inpatient on 09/28/22 showed a mass in proximal ascending colon, which pathology confirmed to be adenocarcinoma. He was taken for resection on 11/08/22, final path showing invasive moderately differentiated colonic adenocarcinoma, 2.5 cm, focally extending into pericolonic connective tissue,  negative margins and lymph nodes. He is due for surveillance CT per last note with Dr. Odette Benjamin.  Today, he states that he is doing well overall. His appetite level is at 50%. His energy level is at 50%.  INTERVAL HISTORY:   Russell [name] is a 78 y.o. male presenting to the clinic today for follow-up of history of colon cancer and lung adenocarcinoma. He was last seen by me on 11/13/23 in consultation.  Since his last visit, he underwent CT CAP on 513/25 that found: Interval partial right hemicolectomy and reanastomosis. Unchanged postoperative appearance of left lower lobe wedge resection. No evidence of recurrent or metastatic disease in  the chest, abdomen, or pelvis. Unchanged mild pulmonary fibrosis in an alternative diagnosis pattern by ATS pulmonary fibrosis criteria. Prostatomegaly.  Today, he states that he is doing well overall. His appetite level is at 50%. His energy level is at 50%.   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   anterior cer disc and fusion with plates Z6-1 and C4-5 at [facility]   Colonic mass 09/29/2022   Constipation    Encounter for general adult medical examination with abnormal findings 06/27/2022   Enlarged prostate    GERD (gastroesophageal reflux disease)    Hemorrhoids    History of kidney stones    passed stones   History of lumbar laminectomy 05/12/2020   open L2-L5 at [facility]   Hx of colonic polyp 05/29/2018   05/2018 cecal ssp   Lung nodule 02/2021   Lyme disease    Maxillary sinus polyp    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   Mixed hyperlipidemia 07/19/2023   Pneumonia    Prolapsed internal hemorrhoids, grade 3 09/10/2006   Qualifier: Diagnosis of  By: Nana Avers MD, Cornelius     Salivary gland enlargement    Sleep apnea 10 years ago   declined CPAP   Stroke (HCC) 15-20 years ago    Surgical History: Past Surgical History:  Procedure Laterality Date   BIOPSY  09/28/2022   Procedure: BIOPSY;  Surgeon: Albertina Hugger, MD;  Location: [facility] ENDOS[facility];  Service: Gastroenterology;;   BRONCHIAL BIOPSY  05/16/2021   Procedure: BRONCHIAL BIOPSIES;  Surgeon: Prudy Brownie, DO;  Location: [name];  Service: Pulmonary;;   BRONCHIAL BRUSHINGS  05/16/2021   Procedure: BRONCHIAL BRUSHINGS;  Surgeon: Prudy Brownie, DO;  Location: [facility]  ENDOSCOPY;  Service: Pulmonary;;   BRONCHIAL NEEDLE ASPIRATION BIOPSY  05/16/2021   Procedure: BRONCHIAL NEEDLE ASPIRATION BIOPSIES;  Surgeon: Prudy Brownie, DO;  Location: [name];  Service: Pulmonary;;   CERVICAL SPINE SURGERY  04/10/2020   C3-C4 with pin at [facility]   COLONOSCOPY     COLONOSCOPY WITH PROPOFOL  N/A 09/28/2022   Procedure: COLONOSCOPY WITH PROPOFOL ;  Surgeon: Albertina Hugger, MD;  Location: [facility];  Service: Gastroenterology;  Laterality: N/A;   ESOPHAGOGASTRODUODENOSCOPY (EGD) WITH PROPOFOL  N/A 09/28/2022   Procedure: ESOPHAGOGASTRODUODENOSCOPY (EGD) WITH PROPOFOL ;  Surgeon: Albertina Hugger, MD;  Location: [facility];  Service: Gastroenterology;  Laterality: N/A;   FIDUCIAL MARKER PLACEMENT  05/16/2021  Procedure: FIDUCIAL MARKER PLACEMENT;  Surgeon: Prudy Brownie, DO;  Location: [name];  Service: Pulmonary;;   HEMORRHOID BANDING     LUMBAR LAMINECTOMY  03/08/2010   L2-3, cauda equina syndrome   lumbar laminectomy  05/12/2020   [facility]   SPINE SURGERY     UPPER GI ENDOSCOPY     VIDEO BRONCHOSCOPY WITH ENDOBRONCHIAL NAVIGATION Left 05/16/2021   Procedure: VIDEO BRONCHOSCOPY WITH ENDOBRONCHIAL NAVIGATION;  Surgeon: Prudy Brownie, DO;  Location: [name];  Service: Pulmonary;  Laterality: Left;  ION w/ fiducial placement   VIDEO BRONCHOSCOPY WITH RADIAL ENDOBRONCHIAL ULTRASOUND  05/16/2021   Procedure: VIDEO BRONCHOSCOPY WITH RADIAL ENDOBRONCHIAL ULTRASOUND;  Surgeon: Prudy Brownie, DO;  Location: [name];  Service: Pulmonary;;   wisdom teeth ext      Social History: Social History   Socioeconomic History   Marital status: Widowed    Spouse name: Not on file   Number of children: 0   Years of education: Not on file   Highest education level: Master's degree (e.g., MA, MS, MEng, MEd, MSW, MBA)  Occupational History   Occupation: retired  Tobacco Use   Smoking status: Former    Current packs/day: 0.00    Average packs/day: 0.3  packs/day for 9.0 years (2.3 ttl pk-yrs)    Types: Cigarettes    Start date: 10/15/1965    Quit date: 10/16/1974    Years since quitting: 49.1    Passive exposure: Past   Smokeless tobacco: Never  Vaping Use   Vaping status: Never Used  Substance and Sexual Activity   Alcohol use: Not Currently   Drug use: No   Sexual activity: Not Currently  Other Topics Concern   Not on file  Social History Narrative   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   Social Drivers of Corporate investment banker Strain: Low Risk  (11/13/2023)   Overall Financial Resource Strain (CARDIA)    Difficulty of Paying Living Expenses: Not hard at all  Food Insecurity: No Food Insecurity (11/13/2023)   Hunger Vital Sign    Worried About Running Out of Food in the Last Year: Never true    Ran Out of Food in the Last Year: Never true  Transportation Needs: No Transportation Needs (11/13/2023)   PRAPARE - Administrator, Civil Service (Medical): No    Lack of Transportation (Non-Medical): No  Physical Activity: Insufficiently Active (11/13/2023)   Exercise Vital Sign    Days of Exercise per Week: 1 day    Minutes of Exercise per Session: 10 min  Stress: No Stress Concern Present (11/13/2023)   [employer] of Occupational Health - Occupational Stress Questionnaire    Feeling of Stress : Not at all  Social Connections: Moderately Isolated (11/03/2023)   Social Connection and Isolation Panel [NHANES]    Frequency of Communication with Friends and Family: More than three times a week    Frequency of Social Gatherings with Friends and Family: Twice a week    Attends Religious Services: More than 4 times per year    Active Member of [employer] or Organizations: No    Attends Banker Meetings: Never    Marital Status:  Widowed  Intimate Partner Violence: Not At Risk (  11/13/2023)   Humiliation, Afraid, Rape, and Kick questionnaire    Fear of Current or Ex-Partner: No    Emotionally Abused: No    Physically Abused: No    Sexually Abused: No    Family History: Family History  Problem Relation Age of Onset   Aortic aneurysm Mother    Varicose Veins Mother    Heart attack Father    Arthritis Father    Aortic aneurysm Sister    Early death Sister    CVA Sister    Aortic aneurysm Sister    Aortic aneurysm Sister    Colon cancer Neg Hx    Pancreatic cancer Neg Hx    Rectal cancer Neg Hx    Stomach cancer Neg Hx    Esophageal cancer Neg Hx    Colon polyps Neg Hx     Current Medications:  Current Outpatient Medications:    aspirin  EC 81 MG tablet, Take 1 tablet (81 mg total) by mouth every evening., Disp: 30 tablet, Rfl: 12   finasteride  (PROSCAR ) 5 MG tablet, Take 1 tablet (5 mg total) by mouth daily., Disp: 90 tablet, Rfl: 3   Ginger, Zingiber officinalis, (GINGER PO), Take by mouth., Disp: , Rfl:    iron  polysaccharides (NIFEREX) 150 MG capsule, Take 1 capsule (150 mg total) by mouth daily. (Patient taking differently: Take 150 mg by mouth every evening.), Disp: 30 capsule, Rfl: 1   Magnesium 400 MG CAPS, Take 400 mg by mouth 2 (two) times daily. asporotate, Disp: , Rfl:    melatonin 5 MG TABS, Take 5 mg by mouth at bedtime., Disp: , Rfl:    methylcellulose (CITRUCEL) oral powder, Take by mouth., Disp: , Rfl:    metoprolol  succinate (TOPROL -XL) 25 MG 24 hr tablet, Take 1 tablet (25 mg total) by mouth daily., Disp: 90 tablet, Rfl: 3   Multiple Vitamin (MULTIVITAMIN) tablet, Take 1 tablet by mouth daily., Disp: , Rfl:    Omega-3 Fatty Acids (FISH OIL PO), Take 460 mg by mouth 2 (two) times daily., Disp: , Rfl:    omeprazole  (PRILOSEC) 40 MG capsule, TAKE (1) CAPSULE BY MOUTH ONCE DAILY., Disp: 90 capsule, Rfl: 3   silodosin  (RAPAFLO ) 8 MG CAPS capsule, Take 1 capsule (8 mg total) by mouth 2 (two)  times daily., Disp: 60 capsule, Rfl: 11   TART CHERRY PO, Take by mouth., Disp: , Rfl:    TURMERIC PO, Take 500 mg by mouth 2 (two) times daily. curamed curcumin, Disp: , Rfl:    Allergies: No Known Allergies  REVIEW OF SYSTEMS:   Review of Systems  Constitutional:  Negative for chills, fatigue and fever.  HENT:   Negative for lump/mass, mouth sores, nosebleeds, sore throat and trouble swallowing.   Eyes:  Negative for eye problems.  Respiratory:  Negative for cough and shortness of breath.   Cardiovascular:  Negative for chest pain, leg swelling and palpitations.  Gastrointestinal:  Negative for abdominal pain, constipation, diarrhea, nausea and vomiting.  Genitourinary:  Negative for bladder incontinence, difficulty urinating, dysuria, frequency, hematuria and nocturia.   Musculoskeletal:  Negative for arthralgias, back pain, flank pain, myalgias and neck pain.  Skin:  Negative for itching and rash.  Neurological:  Positive for headaches. Negative for dizziness and numbness.  Hematological:  Does not bruise/bleed easily.  Psychiatric/Behavioral:  Negative for depression, sleep disturbance and suicidal ideas. The patient is not nervous/anxious.   All other systems reviewed and are negative.    VITALS:   Blood pressure 125/77, pulse Aaron Aas)  55, temperature 98.2 F (36.8 C), temperature source Tympanic, resp. rate 18, weight 185 lb 13.6 oz (84.3 kg), SpO2 100%.  Wt Readings from Last 3 Encounters:  12/03/23 185 lb 13.6 oz (84.3 kg)  11/13/23 186 lb 4.6 oz (84.5 kg)  11/04/23 187 lb 9.6 oz (85.1 kg)    Body mass index is 25.45 kg/m.  Performance status (ECOG): 1 - Symptomatic but completely ambulatory  PHYSICAL EXAM:   Physical Exam Vitals and nursing note reviewed. Exam conducted with a chaperone present.  Constitutional:      Appearance: Normal appearance.  Cardiovascular:     Rate and Rhythm: Normal rate and regular rhythm.     Pulses: Normal pulses.     Heart sounds:  Normal heart sounds.  Pulmonary:     Effort: Pulmonary effort is normal.     Breath sounds: Normal breath sounds.  Abdominal:     Palpations: Abdomen is soft. There is no hepatomegaly, splenomegaly or mass.     Tenderness: There is no abdominal tenderness.  Musculoskeletal:     Right lower leg: No edema.     Left lower leg: No edema.  Lymphadenopathy:     Cervical: No cervical adenopathy.     Right cervical: No superficial, deep or posterior cervical adenopathy.    Left cervical: No superficial, deep or posterior cervical adenopathy.     Upper Body:     Right upper body: No supraclavicular or axillary adenopathy.     Left upper body: No supraclavicular or axillary adenopathy.  Neurological:     General: No focal deficit present.     Mental Status: He is alert and oriented to person, place, and time.  Psychiatric:        Mood and Affect: Mood normal.        Behavior: Behavior normal.     LABS:   CBC    Component Value Date/Time   WBC 5.2 11/13/2023 0857   RBC 5.06 11/13/2023 0857   HGB 12.4 (L) 11/13/2023 0857   HGB 11.4 (L) 07/19/2023 1031   HCT 38.9 (L) 11/13/2023 0857   HCT 38.0 07/19/2023 1031   PLT 235 11/13/2023 0857   PLT 219 07/19/2023 1031   MCV 76.9 (L) 11/13/2023 0857   MCV 75 (L) 07/19/2023 1031   MCH 24.5 (L) 11/13/2023 0857   MCHC 31.9 11/13/2023 0857   RDW 16.1 (H) 11/13/2023 0857   RDW 18.2 (H) 07/19/2023 1031   LYMPHSABS 1.2 11/13/2023 0857   LYMPHSABS 1.1 07/19/2023 1031   MONOABS 0.6 11/13/2023 0857   EOSABS 0.1 11/13/2023 0857   EOSABS 0.1 07/19/2023 1031   BASOSABS 0.0 11/13/2023 0857   BASOSABS 0.0 07/19/2023 1031    CMP    Component Value Date/Time   NA 137 11/13/2023 0857   NA 140 07/19/2023 1031   K 4.3 11/13/2023 0857   CL 102 11/13/2023 0857   CO2 27 11/13/2023 0857   GLUCOSE 112 (H) 11/13/2023 0857   BUN 17 11/13/2023 0857   BUN 12 07/19/2023 1031   CREATININE 0.66 11/13/2023 0857   CALCIUM 9.2 11/13/2023 0857   PROT 7.3  11/13/2023 0857   PROT 6.6 07/19/2023 1031   ALBUMIN  4.0 11/13/2023 0857   ALBUMIN  4.2 07/19/2023 1031   AST 24 11/13/2023 0857   ALT 18 11/13/2023 0857   ALKPHOS 44 11/13/2023 0857   BILITOT 1.1 11/13/2023 0857   BILITOT 0.5 07/19/2023 1031   GFRNONAA >60 11/13/2023 0857   GFRAA >60 06/13/2018 1020  Lab Results  Component Value Date   CEA1 3.5 11/13/2023   /  CEA  Date Value Ref Range Status  11/13/2023 3.5 0.0 - 4.7 ng/mL Final    Comment:    (NOTE)                             Nonsmokers          <3.9                             Smokers             <5.6 Roche [employer]ostics Electrochemiluminescence Immunoassay (ECLIA) Values obtained with different assay methods or kits cannot be used interchangeably.  Results cannot be interpreted as absolute evidence of the presence or absence of malignant disease. Performed At: [facility] [address] [place], [place] [id number] Pearlean Botts MD YS:[id number]    Lab Results  Component Value Date   PSA1 1.1 04/24/2023   No results found for: "NAT557" No results found for: "CAN125"  No results found for: "TOTALPROTELP", "ALBUMINELP", "A1GS", "A2GS", "BETS", "BETA2SER", "GAMS", "MSPIKE", "SPEI" Lab Results  Component Value Date   TIBC 371 11/13/2023   TIBC 396 09/26/2022   FERRITIN 14 (L) 11/13/2023   FERRITIN 115 10/08/2022   FERRITIN 6 (L) 09/26/2022   IRONPCTSAT 28 11/13/2023   IRONPCTSAT 3 (L) 09/26/2022   No results found for: "LDH"   STUDIES:   CT CHEST ABDOMEN PELVIS W CONTRAST Result Date: 11/27/2023 CLINICAL DATA:  History of lung cancer and colon cancer * Tracking Code: BO * EXAM: CT CHEST, ABDOMEN, AND PELVIS WITH CONTRAST TECHNIQUE: Multidetector CT imaging of the chest, abdomen and pelvis was performed following the standard protocol during bolus administration of intravenous contrast. RADIATION DOSE REDUCTION: This exam was performed according to the departmental dose-optimization program which  includes automated exposure control, adjustment of the mA and/or kV according to patient size and/or use of iterative reconstruction technique. CONTRAST:  OMNIPAQUE  IOHEXOL  300 MG/ML SOLN additional oral enteric contrast COMPARISON:  CT chest abdomen pelvis, 09/29/2022 FINDINGS: CT CHEST FINDINGS Cardiovascular: No significant vascular findings. Normal heart size. No pericardial effusion. Mediastinum/Nodes: No enlarged mediastinal, hilar, or axillary lymph nodes. Thyroid  gland, trachea, and esophagus demonstrate no significant findings. Lungs/Pleura: Unchanged mild pulmonary fibrosis in a pattern with apical to basal gradient, featuring irregular peripheral interstitial opacity, septal thickening, and mild traction bronchiectasis without significant subpleural bronchiolectasis or honeycombing. Some evidence of subpleural sparing. Unchanged postoperative appearance of left lower lobe wedge resection. No pleural effusion or pneumothorax. Musculoskeletal: No chest wall abnormality. No acute osseous findings. CT ABDOMEN PELVIS FINDINGS Hepatobiliary: No solid liver abnormality is seen. No gallstones, gallbladder wall thickening, or biliary dilatation. Pancreas: Unremarkable. No pancreatic ductal dilatation or surrounding inflammatory changes. Spleen: Normal in size without significant abnormality. Adrenals/Urinary Tract: Adrenal glands are unremarkable. Kidneys are normal, without renal calculi, solid lesion, or hydronephrosis. Bladder is unremarkable. Stomach/Bowel: Stomach is within normal limits. Interval partial right hemicolectomy and reanastomosis. No evidence of bowel wall thickening, distention, or inflammatory changes. Vascular/Lymphatic: No significant vascular findings are present. No enlarged abdominal or pelvic lymph nodes. Reproductive: Prostatomegaly. Other: No abdominal wall hernia or abnormality. No ascites. Musculoskeletal: No acute osseous findings. IMPRESSION: 1. Interval partial right  hemicolectomy and reanastomosis. 2. Unchanged postoperative appearance of left lower lobe wedge resection. 3. No evidence of recurrent or metastatic disease in the chest, abdomen,  or pelvis. 4. Unchanged mild pulmonary fibrosis in an alternative diagnosis pattern by ATS pulmonary fibrosis criteria. 5. Prostatomegaly. Electronically Signed   By: Fredricka Jenny M.D.   On: 11/27/2023 07:22   US  ARTERIAL ABI (SCREENING LOWER EXTREMITY) Result Date: 11/18/2023 CLINICAL DATA:  Left foot cyanosis and swelling EXAM: NONINVASIVE PHYSIOLOGIC VASCULAR STUDY OF BILATERAL LOWER EXTREMITIES TECHNIQUE: Evaluation of both lower extremities were performed at rest, including calculation of ankle-brachial indices with single level Doppler, pressure and pulse volume recording. COMPARISON:  None Available. FINDINGS: Right ABI:  1.17 Left ABI:  1.14 Right Lower Extremity:  Normal arterial waveforms at the ankle. Left Lower Extremity:  Normal arterial waveforms at the ankle. The lowest recorded ABI 0.91 in the right ankle dorsalis pedis. No abnormalities identified in the symptomatic level. IMPRESSION: Normal ABIs bilaterally Electronically Signed   By: Susan Ensign   On: 11/18/2023 13:24

## 2023-12-03 NOTE — Patient Instructions (Signed)
 [facility] at [facility] Discharge Instructions   You were seen and examined today by Dr. Cheree Cords.  He reviewed the results of your lab work which are normal/stable. Signatera and MSI on the pathology are pending.   He reviewed the results of your CT scan which did not show any evidence of cancer.   We will refer you back to Dr. Dominic Friendly for colonoscopy.   We will see you back in 3 months. We will repeat lab work prior to this visit.   Return as scheduled.    Thank you for choosing [facility] at [facility] to provide your oncology and hematology care.  To afford each patient quality time with our provider, please arrive at least 15 minutes before your scheduled appointment time.   If you have a lab appointment with the Cancer Center please come in thru the Main Entrance and check in at the main information desk.  You need to re-schedule your appointment should you arrive 10 or more minutes late.  We strive to give you quality time with our providers, and arriving late affects you and other patients whose appointments are after yours.  Also, if you no show three or more times for appointments you may be dismissed from the clinic at the providers discretion.     Again, thank you for choosing [facility].  Our hope is that these requests will decrease the amount of time that you wait before being seen by our physicians.       _____________________________________________________________  Should you have questions after your visit to [facility], please contact our office at [phone] and follow the prompts.  Our office hours are 8:00 a.m. and 4:30 p.m. Monday - Friday.  Please note that voicemails left after 4:00 p.m. may not be returned until the following business day.  We are closed weekends and major holidays.  You do have access to a nurse 24-7, just call the main number to the clinic [phone] and do not  press any options, hold on the line and a nurse will answer the phone.    For prescription refill requests, have your pharmacy contact our office and allow 72 hours.    Due to Covid, you will need to wear a mask upon entering the hospital. If you do not have a mask, a mask will be given to you at the Main Entrance upon arrival. For doctor visits, patients may have 1 support person age 16 or older with them. For treatment visits, patients can not have anyone with them due to social distancing guidelines and our immunocompromised population.

## 2023-12-04 ENCOUNTER — Ambulatory Visit: Admitting: Hematology

## 2023-12-04 DIAGNOSIS — C182 Malignant neoplasm of ascending colon: Secondary | ICD-10-CM | POA: Diagnosis not present

## 2023-12-16 ENCOUNTER — Encounter: Payer: Self-pay | Admitting: Internal Medicine

## 2023-12-18 LAB — SIGNATERA
SIGNATERA MTM READOUT: 0 MTM/ml
SIGNATERA TEST RESULT: NEGATIVE

## 2023-12-23 ENCOUNTER — Encounter (HOSPITAL_COMMUNITY): Payer: Self-pay

## 2023-12-26 ENCOUNTER — Encounter: Payer: Self-pay | Admitting: Licensed Clinical Social Worker

## 2023-12-26 ENCOUNTER — Inpatient Hospital Stay: Attending: Hematology | Admitting: Licensed Clinical Social Worker

## 2023-12-26 DIAGNOSIS — Z1509 Genetic susceptibility to other malignant neoplasm: Secondary | ICD-10-CM | POA: Diagnosis not present

## 2023-12-26 DIAGNOSIS — Z1379 Encounter for other screening for genetic and chromosomal anomalies: Secondary | ICD-10-CM | POA: Diagnosis not present

## 2023-12-26 NOTE — Progress Notes (Signed)
 REFERRING PROVIDER: Paulett Boros, MD [address] [place],  [place] [address]  PRIMARY PROVIDER:  Meldon Sport, MD  PRIMARY REASON FOR VISIT:  1. PMS2-related Lynch syndrome (HNPCC4)   2. Genetic testing    I connected with Russell [name] on 12/26/2023 at 8:50 AM EDT by MyChart video conference and verified that I am speaking with the correct person using two identifiers.    Patient location: home Provider location: [facility]  HISTORY OF PRESENT ILLNESS:   Russell [name], a 78 y.o. male, was seen for a [place] cancer genetics consultation at the request of Dr. Cheree Cords due to a personal history of colon cancer and hereditary cancer genetic testing that showed a PMS2 pathogenic variant called [id number] (p.Pro246Cysfs*3) .  Russell [name] presents to clinic today to discuss his genetic test result.  Russell [name] was diagnosed with lung cancer in 2023 and colon cancer in 2024. He underwent hemicolectomy in April 2024.  He has a colonoscopy scheduled with Dr. Willy Harvest in July and dermatology appointment scheduled in December.   CANCER HISTORY:  Oncology History  Cancer of right colon (HCC)  11/08/2022 Initial Diagnosis   Cancer of right colon (HCC)   11/12/2023 Cancer Staging   Staging form: Colon and Rectum, AJCC 8th Edition - Clinical stage from 11/12/2023: Stage IIA (cT3, cN0, cM0) - Signed by Paulett Boros, MD on 11/12/2023 Histopathologic type: Adenocarcinoma, NOS Stage prefix: Initial diagnosis Total positive nodes: 0 Total nodes examined: 16 Histologic grade (G): G2 Histologic grading system: 4 grade system Laterality: Right Tumor size (mm): 25 Lymph-vascular invasion (LVI): LVI not present (absent)/not identified Specimen type: Excision Tumor deposits (TD): Absent Carcinoembryonic antigen (CEA) (ng/mL): 2.1     Past Medical History:  Diagnosis Date   Acute blood loss anemia 09/26/2022   Anemia 2019   Arachnoiditis    Arthritis     Bleeding internal hemorrhoids 09/10/2006   Qualifier: Diagnosis of  By: Nana Avers MD, Cornelius     Cancer [facility]) 2022   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HCC) 2011   Cerebellar infarct (HCC)    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   anterior cer disc and fusion with plates Q0-3 and C4-5 at [facility]   Colonic mass 09/29/2022   Constipation    Encounter for general adult medical examination with abnormal findings 06/27/2022   Enlarged prostate    GERD (gastroesophageal reflux disease)    Hemorrhoids    History of kidney stones    passed stones   History of lumbar laminectomy 05/12/2020   open L2-L5 at [facility]   Hx of colonic polyp 05/29/2018   05/2018 cecal ssp   Lung nodule 02/2021   Lyme disease    Maxillary sinus polyp    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   Mixed hyperlipidemia 07/19/2023   Pneumonia    Prolapsed internal hemorrhoids, grade 3 09/10/2006   Qualifier: Diagnosis of  By: Nana Avers MD, Cornelius     Salivary gland enlargement    Sleep apnea 10 years ago   declined CPAP   Stroke (HCC) 15-20 years ago    Past Surgical History:  Procedure Laterality Date   BIOPSY  09/28/2022   Procedure: BIOPSY;  Surgeon: Albertina Hugger, MD;  Location: [facility];  Service: Gastroenterology;;   BRONCHIAL BIOPSY  05/16/2021   Procedure: BRONCHIAL BIOPSIES;  Surgeon: Prudy Brownie, DO;  Location: [name];  Service: Pulmonary;;   BRONCHIAL BRUSHINGS  05/16/2021   Procedure: BRONCHIAL BRUSHINGS;  Surgeon: Antonio Baumgarten  L, DO;  Location: [name];  Service: Pulmonary;;   BRONCHIAL NEEDLE ASPIRATION BIOPSY  05/16/2021   Procedure: BRONCHIAL NEEDLE ASPIRATION BIOPSIES;  Surgeon: Prudy Brownie, DO;  Location: [name];  Service: Pulmonary;;   CERVICAL SPINE SURGERY  04/10/2020   C3-C4 with pin at [facility]   COLONOSCOPY     COLONOSCOPY WITH PROPOFOL  N/A 09/28/2022   Procedure: COLONOSCOPY WITH PROPOFOL ;  Surgeon: Albertina Hugger, MD;  Location: [facility];  Service: Gastroenterology;   Laterality: N/A;   ESOPHAGOGASTRODUODENOSCOPY (EGD) WITH PROPOFOL  N/A 09/28/2022   Procedure: ESOPHAGOGASTRODUODENOSCOPY (EGD) WITH PROPOFOL ;  Surgeon: Albertina Hugger, MD;  Location: [facility];  Service: Gastroenterology;  Laterality: N/A;   FIDUCIAL MARKER PLACEMENT  05/16/2021   Procedure: FIDUCIAL MARKER PLACEMENT;  Surgeon: Prudy Brownie, DO;  Location: [name];  Service: Pulmonary;;   HEMORRHOID BANDING     LUMBAR LAMINECTOMY  03/08/2010   L2-3, cauda equina syndrome   lumbar laminectomy  05/12/2020   [facility]   SPINE SURGERY     UPPER GI ENDOSCOPY     VIDEO BRONCHOSCOPY WITH ENDOBRONCHIAL NAVIGATION Left 05/16/2021   Procedure: VIDEO BRONCHOSCOPY WITH ENDOBRONCHIAL NAVIGATION;  Surgeon: Prudy Brownie, DO;  Location: [name];  Service: Pulmonary;  Laterality: Left;  ION w/ fiducial placement   VIDEO BRONCHOSCOPY WITH RADIAL ENDOBRONCHIAL ULTRASOUND  05/16/2021   Procedure: VIDEO BRONCHOSCOPY WITH RADIAL ENDOBRONCHIAL ULTRASOUND;  Surgeon: Prudy Brownie, DO;  Location: [name];  Service: Pulmonary;;   wisdom teeth ext      FAMILY HISTORY:  We obtained a detailed, 4-generation family history.  Significant diagnoses are listed below: Family History  Problem Relation Age of Onset   Aortic aneurysm Mother    Varicose Veins Mother    Heart attack Father    Arthritis Father    Aortic aneurysm Sister    Early death Sister    CVA Sister    Aortic aneurysm Sister    Aortic aneurysm Sister    Breast cancer Paternal Aunt    Colon cancer Neg Hx    Pancreatic cancer Neg Hx    Rectal cancer Neg Hx    Stomach cancer Neg Hx    Esophageal cancer Neg Hx    Colon polyps Neg Hx    Russell [name] does not have children. He had 4 sisters, two have passed, two are living, no history of cancer.   Russell [name] mother passed at 84 of an aortic aneurysm. No known cancers on this side of the family.  Russell [name] father passed at 75. Patient's paternal aunt may have died of  breast cancer, no other known cancers on this side of the family.  Russell [name] is unaware of previous family history of genetic testing for hereditary cancer risks. There is no reported Ashkenazi Jewish ancestry. There is no known consanguinity.    GENETIC COUNSELING ASSESSMENT: Russell [name] is a 78 y.o. male with a personal history of a PMS2 mutation.  We, therefore, discussed and recommended the following at today's visit.   Russell [name] tested positive for a single pathogenic variant (harmful genetic change) in the PMS2 gene. Specifically, this variant is [id number] (p.Pro246Cysfs*3).  The Common Hereditary Cancers Panel + RNA offered by [facility] includes sequencing and/or deletion duplication testing of the following 48 genes: APC*, ATM*, AXIN2, BAP1, BARD1, BMPR1A, BRCA1, BRCA2, BRIP1, CDH1, CDK4, CDKN2A (p14ARF), CDKN2A (p16INK4a), CHEK2, CTNNA1, DICER1*, EPCAM*, FH*, GREM1*, HOXB13, KIT, MBD4, MEN1*, MLH1*, MSH2*, MSH3*, MSH6*, MUTYH, NF1*, NTHL1, PALB2, PDGFRA,  PMS2*, POLD1*, POLE, PTEN*, RAD51C, RAD51D, SDHA*, SDHB, SDHC*, SDHD, SMAD4, SMARCA4, STK11, TP53, TSC1*, TSC2, VHL.   The test report has been scanned into EPIC and is located under the Molecular Pathology section of the Results Review tab.  A portion of the result report is included below for reference. Genetic testing reported out on 12/03/2023.     PMS2 Clinical Information Pathogenic variants in the PMS2 gene are associated with Lynch syndrome.  The cancers associated with PMS2 are: Colorectal cancer, 8.7-20% risk (average age of diagnosis is 58-66) Endometrial cancer, 13-26% risk (average age of diagnosis is 94-50) Ovarian cancer, up to a 3% risk (average age of diagnosis is 18-59) Renal pelvis and/or ureter, up to a 3.7% risk (average age of diagnosis is unknown) Breast, prostate, pancreatic, gastric, small bowel, bladder, biliary tract, and brain cancer risk may be elevated  Management  Recommendations:  Colorectal Cancer Screening: High quality colonoscopy at age 11-35 or 2-5 years prior to the earliest colon cancer if it is diagnosed before age 55 and repeat every 1-3 years Studies have demonstrated that the use of daily aspirin  decreases the colorectal cancer risk in patients with Lynch Syndrome. Ongoing studies are investigating the optimal dose and duration of use of aspirin  for colon cancer prevention in Lynch Syndrome. The decision to use aspirin  should be made on an individualized basis including a discussion of dose, benefits, and adverse effects.    Endometrial Cancer Screening/Risk Reduction: Women should report any abnormal uterine bleeding or postmenopausal bleeding. The evaluation of these symptoms should include an endometrial biopsy. A hysterectomy may be considered. The timing should be individualized based on whether childbearing is complete, comorbidities, and family history. Endometrial cancer screening does not have a proven benefit in women with Lynch Syndrome. However, endometrial biopsy is highly sensitive and specific as a diagnostic procedure. Screening via endometrial biopsy every 1-2 years starting at age 55-35 can be considered. Transvaginal ultrasounds may be considered in postmenopausal women at their clinician's discretion.  Ovarian Cancer Screening/Risk Reduction: There is currently insufficient evidence to make a specific recommendation for a prophylactic bilateral salpingo-oophorectomy (BSO), or having the ovaries and fallopian tubes removed, for individuals who carry a PMS2 pathogenic variant. Current data does not support routine ovarian screening for Lynch syndrome, therefore it may be considered at the clinician's discretion. Screening includes transvaginal ultrasounds and a blood test to measure CA-125 levels every 6-12 months. If there is a family history of ovarian cancer, have a discussion with your physician about the benefits and  limitations of screening and risk reduction strategies.  Women should be aware of symptoms that might be associated with the development of ovarian cancer including pelvic or abdominal pain, bloating, increased abdominal girth, difficulty eating, early satiety, or urinary frequency or urgency. Symptoms that persist for several weeks and are a change from a woman's baseline should prompt evaluation by her physician.  Urothelial Cancer Screening: Annual urinalysis starting at age 33-35 may be considered in selected individuals such as those with a family history of urothelial cancer (renal pelvis, ureter, and/or bladder).  Gastric and Small Bowel Cancer Screening: Consider upper GI surveillance with EGD starting at age 33-40 years and repeat every 2-4 years, preferably performed in conjunction with colonoscopy.  Random biopsy of the proximal and distal stomach should at minimum be performed on the initial procedure to assess for H. pylori, autoimmune gastritis, and intestinal metaplasia. Push enteroscopy can be considered in place of EGD to enhance small bowel visualization, although its incremental  yield for detection of neoplasia over EGD remains uncertain. Individuals not undergoing upper endoscopic surveillance should have one-time noninvasive testing for H. pylori at the time of Lynch Syndrome diagnosis, with treatment indicated if H. pylori is detected.   Pancreatic Cancer Screening: Avoid smoking, heavy alcohol use, and obesity. It has been suggested that pancreatic cancer screening be limited to those with a family history of pancreatic cancer (first- or second-degree relative). Ideally, screening should be performed in experienced centers utilizing a multidisciplinary approach under research conditions. Recommended screening include annual endoscopic ultrasound (preferred) and/or MRI of the pancreas starting at age 39 or 37 years younger than the earliest age diagnosis in the family. Annual  concurrent CA19-9 testing should also be considered.  Prostate Cancer Screening: Consider beginning annual PSA blood test and digital rectal exams at age 69.  Brain Cancer Screening: Patients should be educated regarding signs and symptoms of neurologic cancer and the importance of prompt reporting of abnormal symptoms to their physicians.  Skin Cancer Screening: Frequency of malignant and benign skin tumors such as sebaceous adenocarcinomas, sebaceous adenomas, and keratoacanthomas has been reported to be increased among patients with Lynch Syndrome, but cumulative lifetime risk and median age of presentation are uncertain. Further, an elevated risk of sebaceous tumors and keratoacanthoma has not been documented for PMS2 carriers. Consider skin exam every 1-2 years with a health care provider skilled in identifying Lynch Syndrome-associated skin manifestations. Age to start surveillance is uncertain and can be individualized.  Additional Considerations: Patients of reproductive age should be made aware of options for prenatal diagnosis and assisted reproduction including pre-implantation genetic diagnosis. Individuals with a single pathogenic PMS2 variant are also carriers of constitutional MMR deficiency (CMMRD) syndrome. CMMRD is a childhood-onset cancer predisposition syndrome that can present with hematological malignancies, cancers of the brain and central nervous system, Lynch syndrome-associated cancers (colon, uterine, small bowel, urinary tract), embryonic tumors, and sarcomas. Some affected individuals may also display cafe-au-lait macules. For there to be a risk of CMMRD in offspring, an individual and their partner would each have to have a single pathogenic variant in the same MMR gene; in such a case, the risk of having an affected child is 25%.   This information is based on current understanding of the gene and may change in the future.  Implications for Family Members: Hereditary  predisposition to cancer due to pathogenic variants in the PMS2 gene has autosomal dominant inheritance. This means that an individual with a pathogenic variant has a 50% chance of passing the condition on to his/her offspring. Most cases are inherited from a parent, but some cases may occur spontaneously (i.e., an individual with a pathogenic variant has parents who do not have it). Identification of a pathogenic variant allows for the recognition of at-risk relatives who can pursue testing for the familial variant.  Family members are encouraged to consider genetic testing for this familial pathogenic variant. As there are generally no childhood cancer risks associated with pathogenic variants in the PSM2 gene, individuals in the family are not recommended to have testing until they reach at least 76 years of age. They may contact our office at [phone] for more information or to schedule an appointment.  Complimentary testing for the familial variant is available for 90 days from the report date. Family members who live outside of the area are encouraged to find a genetic counselor in their area by visiting: [email or web address].[email or web address].  Resources: FORCE (Facing Our Risk of Cancer Empowered) is a Theatre stage manager for those  with a hereditary predisposition to develop cancer.  FORCE provides information about risk reduction, advocacy, legislation, and clinical trials.  Additionally, [employer] provides a platform for collaboration and support which includes: peer navigation, message boards, local support groups, a toll-free helpline, research registry and recruitment, advocate training, published medical research, webinars, brochures, mastectomy photos, and more.  For more information, visit www.facingourrisk.org  Hereditary Colon Cancer Foundation- www.hcctakesguts.org  Lynch Syndrome International- www.lynchcancers.com  [employer]- www.kintalk.org  [employer]  GourmetDeal.com.ee  PLAN: 1. These results will be made available to his GI provider, Dr. Willy Harvest. He would like Dr. Willy Harvest and Dr. Cheree Cords to follow him for this indication.   2. Russell [name] plans to discuss these results with family and will reach out to us  if we can be of any assistance in coordinating genetic testing for relatives.   We encouraged Russell [name] to remain in contact with us  on an annual basis so we can update his personal and family histories, and let him know of advances in cancer genetics that may benefit the family. Our contact number was provided. Russell [name] questions were answered to his satisfaction today, and he knows he is welcome to call anytime with additional questions.   Valri Gee, MS, [facility] Genetic Counselor [email or web address] Phone: [phone]  45 minutes were spent on the date of the encounter in service to the patient including preparation, face-to-face consultation, documentation and care coordination. Dr. Nelson Bandy was available for discussion regarding this case.   _______________________________________________________________________ For Office Staff:  Number of people involved in session: 1 Was an Intern/ student involved with case: no

## 2023-12-28 ENCOUNTER — Telehealth: Payer: Self-pay | Admitting: Internal Medicine

## 2023-12-28 NOTE — Telephone Encounter (Signed)
 Please contact the patient - he is scheduled to have a colonoscopy 7/24  I also recommend an EGD given diagnosis of Lynch Syndrome - should have stomach biopsies taken per expert recommendations in patients with Lynch syndrome  We can block the 0900 spot  He already has a previsit set up

## 2023-12-28 NOTE — Telephone Encounter (Signed)
-----   Message from Claire Crick sent at 12/26/2023 10:38 AM EDT ----- Hi Dr. Willy Harvest,  You are seeing him in July for colonoscopy! He tested positive for PMS2 Lynch syndrome.  Thanks! Renay Carota

## 2023-12-30 NOTE — Telephone Encounter (Signed)
 Allen informed and appointment changed.

## 2023-12-31 ENCOUNTER — Telehealth: Payer: Self-pay | Admitting: *Deleted

## 2023-12-31 NOTE — Telephone Encounter (Signed)
 Thank you :)

## 2023-12-31 NOTE — Telephone Encounter (Signed)
 He is my patient - Dr. Dominic Friendly did last procedures when he was hospitalized - I am original GI MD  My CMA, PJ has called him and explained about doing the EGD and it is scheduled and the  0900 is blocked  So please do the previsit for EGD/colonoscopy

## 2024-01-14 ENCOUNTER — Ambulatory Visit (AMBULATORY_SURGERY_CENTER): Admitting: *Deleted

## 2024-01-14 VITALS — Ht 72.0 in | Wt 180.0 lb

## 2024-01-14 DIAGNOSIS — Z1509 Genetic susceptibility to other malignant neoplasm: Secondary | ICD-10-CM

## 2024-01-14 DIAGNOSIS — Z85038 Personal history of other malignant neoplasm of large intestine: Secondary | ICD-10-CM

## 2024-01-14 NOTE — Progress Notes (Signed)
 Pt's name and DOB verified at the beginning of the pre-visit wit 2 identifiers  Pt denies any difficulty with ambulating,sitting, laying down or rolling side to side  Pt has no issues moving head neck or swallowing  No egg or soy allergy known to patient   No issues known to pt with past sedation with any surgeries or procedures  No FH of Malignant Hyperthermia  Pt is not on home 02   Pt is not on blood thinners   Pt has frequent issues with constipation RN instructed pt to use Miralax  per bottles instructions a week before prep days. Pt states they will  Pt is not on dialysis  Pt hx of arrhythmia   Pt denies any upcoming cardiac testing  Patient's chart reviewed by Norleen Schillings CNRA prior to pre-visit and patient appropriate for the [facility].  Pre-visit completed and red dot placed by patient's name on their procedure day (on provider's schedule).    Visit by phone  Pt states weight is 180 lb  IInstructions reviewed. Pt given  both [facility] main # and MD on call # prior to instructions.  Pt states understanding of instructions. Instructed pt to review instructions again prior to procedure and call main # given if has questions.. Pt states they will.   Instructed pt on where to find instructions on My Chart.

## 2024-01-28 ENCOUNTER — Encounter: Payer: Self-pay | Admitting: Internal Medicine

## 2024-02-04 ENCOUNTER — Telehealth: Payer: Self-pay | Admitting: Internal Medicine

## 2024-02-04 NOTE — Telephone Encounter (Signed)
 Inbound call from patient states he took a double dose of Murelax yesterday and is now experiencing extremely loose stools. Procedure for 7/24. Please advise.

## 2024-02-04 NOTE — Telephone Encounter (Signed)
 Miralax  2 doses by accident yesterday and seems to be better with loose stool. Instructed pt not to take his PM dose of Miralax  and just start his prep as instructed tomorrow. Instructed pt to keep electrolytes up and pt sates he has drinks with electrolytes in them to drink,

## 2024-02-06 ENCOUNTER — Encounter: Payer: Self-pay | Admitting: Internal Medicine

## 2024-02-06 ENCOUNTER — Ambulatory Visit: Admitting: Internal Medicine

## 2024-02-06 VITALS — BP 121/74 | HR 64 | Temp 97.9°F | Resp 14 | Ht 72.0 in | Wt 180.0 lb

## 2024-02-06 DIAGNOSIS — K253 Acute gastric ulcer without hemorrhage or perforation: Secondary | ICD-10-CM

## 2024-02-06 DIAGNOSIS — Z1211 Encounter for screening for malignant neoplasm of colon: Secondary | ICD-10-CM | POA: Diagnosis not present

## 2024-02-06 DIAGNOSIS — K319 Disease of stomach and duodenum, unspecified: Secondary | ICD-10-CM | POA: Diagnosis not present

## 2024-02-06 DIAGNOSIS — G4730 Sleep apnea, unspecified: Secondary | ICD-10-CM | POA: Diagnosis not present

## 2024-02-06 DIAGNOSIS — K2990 Gastroduodenitis, unspecified, without bleeding: Secondary | ICD-10-CM

## 2024-02-06 DIAGNOSIS — K2970 Gastritis, unspecified, without bleeding: Secondary | ICD-10-CM

## 2024-02-06 DIAGNOSIS — K5730 Diverticulosis of large intestine without perforation or abscess without bleeding: Secondary | ICD-10-CM

## 2024-02-06 DIAGNOSIS — K2980 Duodenitis without bleeding: Secondary | ICD-10-CM | POA: Diagnosis not present

## 2024-02-06 DIAGNOSIS — K317 Polyp of stomach and duodenum: Secondary | ICD-10-CM | POA: Diagnosis not present

## 2024-02-06 DIAGNOSIS — K648 Other hemorrhoids: Secondary | ICD-10-CM | POA: Diagnosis not present

## 2024-02-06 DIAGNOSIS — K31A19 Gastric intestinal metaplasia without dysplasia, unspecified site: Secondary | ICD-10-CM | POA: Diagnosis not present

## 2024-02-06 DIAGNOSIS — K2210 Ulcer of esophagus without bleeding: Secondary | ICD-10-CM | POA: Diagnosis not present

## 2024-02-06 DIAGNOSIS — K2270 Barrett's esophagus without dysplasia: Secondary | ICD-10-CM | POA: Diagnosis not present

## 2024-02-06 DIAGNOSIS — K3189 Other diseases of stomach and duodenum: Secondary | ICD-10-CM

## 2024-02-06 DIAGNOSIS — Z980 Intestinal bypass and anastomosis status: Secondary | ICD-10-CM

## 2024-02-06 DIAGNOSIS — Z1509 Genetic susceptibility to other malignant neoplasm: Secondary | ICD-10-CM | POA: Diagnosis not present

## 2024-02-06 DIAGNOSIS — Z85038 Personal history of other malignant neoplasm of large intestine: Secondary | ICD-10-CM | POA: Diagnosis not present

## 2024-02-06 DIAGNOSIS — Z09 Encounter for follow-up examination after completed treatment for conditions other than malignant neoplasm: Secondary | ICD-10-CM | POA: Diagnosis not present

## 2024-02-06 MED ORDER — SODIUM CHLORIDE 0.9 % IV SOLN: 500.0000 mL | Freq: Once | INTRAVENOUS | Status: DC

## 2024-02-06 NOTE — Progress Notes (Signed)
 Called to room to assist during endoscopic procedure.  Patient ID and intended procedure confirmed with present staff. Received instructions for my participation in the procedure from the performing physician.

## 2024-02-06 NOTE — Patient Instructions (Addendum)
 The upper endoscopy revealed changes if inflammation at the gastroesophageal junction, stomach and upper intestine and I took biopsies. There were also some tiny stomach polyps - look innocent and benign, I checked with biopsies.  No polyps and no cancer seen in the colon. Hemorrhoids and diverticulosis were seen.  I respect your wishes about not repeating tests.  I appreciate the opportunity to care for you. Lupita CHARLENA Commander, MD, FACG    YOU HAD AN ENDOSCOPIC PROCEDURE TODAY AT THE [facility]:   Refer to the procedure report that was given to you for any specific questions about what was found during the examination.  If the procedure report does not answer your questions, please call your gastroenterologist to clarify.  If you requested that your care partner not be given the details of your procedure findings, then the procedure report has been included in a sealed envelope for you to review at your convenience later.  YOU SHOULD EXPECT: Some feelings of bloating in the abdomen. Passage of more gas than usual.  Walking can help get rid of the air that was put into your GI tract during the procedure and reduce the bloating. If you had a lower endoscopy (such as a colonoscopy or flexible sigmoidoscopy) you may notice spotting of blood in your stool or on the toilet paper. If you underwent a bowel prep for your procedure, you may not have a normal bowel movement for a few days.  Please Note:  You might notice some irritation and congestion in your nose or some drainage.  This is from the oxygen used during your procedure.  There is no need for concern and it should clear up in a day or so.  SYMPTOMS TO REPORT IMMEDIATELY:  Following lower endoscopy (colonoscopy or flexible sigmoidoscopy):  Excessive amounts of blood in the stool  Significant tenderness or worsening of abdominal pains  Swelling of the abdomen that is new, acute  Fever of 100F or higher  Following upper  endoscopy (EGD)  Vomiting of blood or coffee ground material  New chest pain or pain under the shoulder blades  Painful or persistently difficult swallowing  New shortness of breath  Fever of 100F or higher  Black, tarry-looking stools  For urgent or emergent issues, a gastroenterologist can be reached at any hour by calling [phone]. Do not use MyChart messaging for urgent concerns.    DIET:  We do recommend a small meal at first, but then you may proceed to your regular diet.  Drink plenty of fluids but you should avoid alcoholic beverages for 24 hours.  ACTIVITY:  You should plan to take it easy for the rest of today and you should NOT DRIVE or use heavy machinery until tomorrow (because of the sedation medicines used during the test).    FOLLOW UP: Our staff will call the number listed on your records the next business day following your procedure.  We will call around 7:15- 8:00 am to check on you and address any questions or concerns that you may have regarding the information given to you following your procedure. If we do not reach you, we will leave a message.     If any biopsies were taken you will be contacted by phone or by letter within the next 1-3 weeks.  Please call us  at [phone] if you have not heard about the biopsies in 3 weeks.    SIGNATURES/CONFIDENTIALITY: You and/or your care partner have signed paperwork which will be  entered into your electronic medical record.  These signatures attest to the fact that that the information above on your After Visit Summary has been reviewed and is understood.  Full responsibility of the confidentiality of this discharge information lies with you and/or your care-partner.

## 2024-02-06 NOTE — Op Note (Signed)
 [facility] Patient Name: Russell [name] Procedure Date: 02/06/2024 11:23 AM MRN: [id number] Endoscopist: Lupita FORBES Commander , MD, [id number] Age: 78 Referring MD:  Date of Birth: 11/29/1945 Gender: Male Account #: [id number] Procedure:                Upper GI endoscopy Indications:              Hereditary nonpolyposis colorectal cancer (Lynch                            Syndrome) Medicines:                Monitored Anesthesia Care Procedure:                Pre-Anesthesia Assessment:                           - Prior to the procedure, a History and Physical                            was performed, and patient medications and                            allergies were reviewed. The patient's tolerance of                            previous anesthesia was also reviewed. The risks                            and benefits of the procedure and the sedation                            options and risks were discussed with the patient.                            All questions were answered, and informed consent                            was obtained. Prior Anticoagulants: The patient has                            taken no anticoagulant or antiplatelet agents. ASA                            Grade Assessment: III - A patient with severe                            systemic disease. After reviewing the risks and                            benefits, the patient was deemed in satisfactory                            condition to undergo the procedure.  After obtaining informed consent, the endoscope was                            passed under direct vision. Throughout the                            procedure, the patient's blood pressure, pulse, and                            oxygen saturations were monitored continuously. The                            Olympus scope [id number] was introduced through the                            mouth, and advanced to the second part of  duodenum.                            The upper GI endoscopy was accomplished without                            difficulty. The patient tolerated the procedure                            well. Scope In: Scope Out: Findings:                 One superficial esophageal ulcer was found at the                            gastroesophageal junction. The lesion was 4 mm in                            largest dimension. Biopsies were taken with a cold                            forceps for histology. Verification of patient                            identification for the specimen was done. Estimated                            blood loss was minimal.                           Multiple diminutive sessile polyps were found in                            the gastric fundus and in the gastric body.                            Biopsies were taken with a cold forceps for  histology. Verification of patient identification                            for the specimen was done. Estimated blood loss was                            minimal.                           Striped moderately erythematous mucosa without                            bleeding was found in the gastric antrum. Biopsies                            were taken with a cold forceps for histology.                            Verification of patient identification for the                            specimen was done. Estimated blood loss was minimal.                           Patchy mild inflammation characterized by                            congestion (edema), erosions and erythema was found                            in the second portion of the duodenum. Biopsies                            were taken with a cold forceps for histology.                            Verification of patient identification for the                            specimen was done. Estimated blood loss was minimal. Complications:            No  immediate complications. Estimated Blood Loss:     Estimated blood loss was minimal. Impression:               - Esophageal ulcer. Biopsied.                           - Multiple gastric polyps. Biopsied. These look                            like benign and innocent fundic gland polyps                           - Erythematous mucosa in the antrum. Biopsied.                           -  Duodenitis. Biopsied. Recommendation:           - Patient has a contact number available for                            emergencies. The signs and symptoms of potential                            delayed complications were discussed with the                            patient. Return to normal activities tomorrow.                            Written discharge instructions were provided to the                            patient.                           - Resume previous diet.                           - Continue present medications. Omeprazole  40 mg                            daily is on list - he says not taking much trying                            to get off it - I have advised returning to daily                            use                           - Await pathology results.                           - See the other procedure note for documentation of                            additional recommendations. Lupita FORBES Commander, MD 02/06/2024 12:09:23 PM This report has been signed electronically.

## 2024-02-06 NOTE — Progress Notes (Signed)
 [facility] History and Physical   Primary Care Physician:  [name], MD   Reason for Procedure:    Encounter Diagnoses  Name Primary?   Personal history of colon cancer Yes   Lynch syndrome      Plan:    EGD and colonoscopy     HPI: Russell [name] is a 78 y.o. male here for f/u after diagnosis and resection/treatment of colon cancer last year. He was found to have Lynch syndrome, so is having a screening EGD also.   He had a horrible time with the prep - incontinence and has cauda equina difficulties and tells me he does not want another colonoscopy after this one.  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   Chronic kidney disease    Colonic mass 09/29/2022   Constipation    Encounter for general adult medical examination with abnormal findings 06/27/2022   Enlarged prostate    GERD (gastroesophageal reflux disease)    Hemorrhoids    History of kidney stones    passed stones   History of lumbar laminectomy 05/12/2020   open L2-L5 at [facility]   Hx of colonic polyp 05/29/2018   05/2018 cecal ssp   Lung nodule 02/2021   Lyme disease    Lynch syndrome    Maxillary sinus polyp    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   Mixed hyperlipidemia 07/19/2023   Pneumonia    Prolapsed internal hemorrhoids, grade 3 09/10/2006   Qualifier: Diagnosis of  By: Karren MD, Cornelius     Salivary gland enlargement    Sleep apnea 10 years ago   declined CPAP   Stroke (HCC) 15-20 years ago    Past Surgical History:  Procedure  Laterality Date   BIOPSY  09/28/2022   Procedure: BIOPSY;  Surgeon: Legrand Victory LITTIE DOUGLAS, MD;  Location: [facility];  Service: Gastroenterology;;   BRONCHIAL BIOPSY  05/16/2021   Procedure: BRONCHIAL BIOPSIES;  Surgeon: Brenna Adine LITTIE, DO;  Location: [name];  Service: Pulmonary;;   BRONCHIAL BRUSHINGS  05/16/2021   Procedure: BRONCHIAL BRUSHINGS;  Surgeon: Brenna Adine LITTIE, DO;  Location: [name];  Service: Pulmonary;;   BRONCHIAL NEEDLE ASPIRATION BIOPSY  05/16/2021   Procedure: BRONCHIAL NEEDLE ASPIRATION BIOPSIES;  Surgeon: Brenna Adine LITTIE, DO;  Location: [name];  Service: Pulmonary;;   CERVICAL SPINE SURGERY  04/10/2020   C3-C4 with pin at [facility]   COLONOSCOPY     COLONOSCOPY WITH PROPOFOL  N/A 09/28/2022   Procedure: COLONOSCOPY WITH PROPOFOL ;  Surgeon: Legrand Victory LITTIE DOUGLAS, MD;  Location: [facility];  Service: Gastroenterology;  Laterality: N/A;   ESOPHAGOGASTRODUODENOSCOPY (EGD) WITH PROPOFOL  N/A 09/28/2022   Procedure: ESOPHAGOGASTRODUODENOSCOPY (EGD) WITH PROPOFOL ;  Surgeon: Legrand Victory LITTIE DOUGLAS, MD;  Location: [facility];  Service: Gastroenterology;  Laterality: N/A;   FIDUCIAL MARKER PLACEMENT  05/16/2021   Procedure: FIDUCIAL MARKER PLACEMENT;  Surgeon: Brenna Adine LITTIE, DO;  Location: [name];  Service: Pulmonary;;  HEMORRHOID BANDING     LUMBAR LAMINECTOMY  03/08/2010   L2-3, cauda equina syndrome   lumbar laminectomy  05/12/2020   [facility]   SPINE SURGERY     UPPER GI ENDOSCOPY     VIDEO BRONCHOSCOPY WITH ENDOBRONCHIAL NAVIGATION Left 05/16/2021   Procedure: VIDEO BRONCHOSCOPY WITH ENDOBRONCHIAL NAVIGATION;  Surgeon: Brenna Adine CROME, DO;  Location: [name];  Service: Pulmonary;  Laterality: Left;  ION w/ fiducial placement   VIDEO BRONCHOSCOPY WITH RADIAL ENDOBRONCHIAL ULTRASOUND  05/16/2021   Procedure: VIDEO BRONCHOSCOPY WITH RADIAL ENDOBRONCHIAL ULTRASOUND;  Surgeon: Brenna Adine CROME, DO;  Location: [name];  Service: Pulmonary;;   wisdom teeth  ext       Current Outpatient Medications  Medication Sig Dispense Refill   aspirin  EC 81 MG tablet Take 1 tablet (81 mg total) by mouth every evening. 30 tablet 12   finasteride  (PROSCAR ) 5 MG tablet Take 1 tablet (5 mg total) by mouth daily. 90 tablet 3   Ginger, Zingiber officinalis, (GINGER PO) Take by mouth.     Magnesium 400 MG CAPS Take 400 mg by mouth 2 (two) times daily. asporotate     melatonin 5 MG TABS Take 5 mg by mouth at bedtime.     methylcellulose (CITRUCEL) oral powder Take by mouth.     metoprolol  succinate (TOPROL -XL) 25 MG 24 hr tablet Take 1 tablet (25 mg total) by mouth daily. 90 tablet 3   Multiple Vitamin (MULTIVITAMIN) tablet Take 1 tablet by mouth daily.     Omega-3 Fatty Acids (FISH OIL PO) Take 460 mg by mouth 2 (two) times daily.     omeprazole  (PRILOSEC) 40 MG capsule TAKE (1) CAPSULE BY MOUTH ONCE DAILY. 90 capsule 3   silodosin  (RAPAFLO ) 8 MG CAPS capsule Take 1 capsule (8 mg total) by mouth 2 (two) times daily. 60 capsule 11   TART CHERRY PO Take by mouth.     TURMERIC PO Take 500 mg by mouth 2 (two) times daily. curamed curcumin     iron  polysaccharides (NIFEREX) 150 MG capsule Take 1 capsule (150 mg total) by mouth daily. (Patient taking differently: Take 1 capsule (150 mg total) by mouth daily.) 30 capsule 1   Current Facility-Administered Medications  Medication Dose Route Frequency Provider Last Rate Last Admin   0.9 %  sodium chloride  infusion  500 mL Intravenous Once Avram Lupita BRAVO, MD        Allergies as of 02/06/2024   (No Known Allergies)    Family History  Problem Relation Age of Onset   Aortic aneurysm Mother    Varicose Veins Mother    Heart attack Father    Arthritis Father    Aortic aneurysm Sister    Early death Sister    CVA Sister    Aortic aneurysm Sister    Aortic aneurysm Sister    Breast cancer Paternal Aunt    Colon cancer Neg Hx    Pancreatic cancer Neg Hx    Rectal cancer Neg Hx    Stomach cancer Neg Hx     Esophageal cancer Neg Hx    Colon polyps Neg Hx     Social History   Socioeconomic History   Marital status: Widowed    Spouse name: Not on file   Number of children: 0   Years of education: Not on file   Highest education level: Master's degree (e.g., MA, MS, MEng, MEd, MSW, MBA)  Occupational History   Occupation: retired  Tobacco Use  Smoking status: Former    Current packs/day: 0.00    Average packs/day: 0.3 packs/day for 9.0 years (2.3 ttl pk-yrs)    Types: Cigarettes    Start date: 10/15/1965    Quit date: 10/16/1974    Years since quitting: 49.3    Passive exposure: Past   Smokeless tobacco: Never  Vaping Use   Vaping status: Never Used  Substance and Sexual Activity   Alcohol use: Not Currently   Drug use: No   Sexual activity: Not Currently  Other Topics Concern   Not on file  Social History Narrative   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   Social Drivers of Corporate investment banker Strain: Low Risk  (11/13/2023)   Overall Financial Resource Strain (CARDIA)    Difficulty of Paying Living Expenses: Not hard at all  Food Insecurity: No Food Insecurity (11/13/2023)   Hunger Vital Sign    Worried About Running Out of Food in the Last Year: Never true    Ran Out of Food in the Last Year: Never true  Transportation Needs: No Transportation Needs (11/13/2023)   PRAPARE - Administrator, Civil Service (Medical): No    Lack of Transportation (Non-Medical): No  Physical Activity: Insufficiently Active (11/13/2023)   Exercise Vital Sign    Days of Exercise per Week: 1 day    Minutes of Exercise per Session: 10 min  Stress: No Stress Concern Present (11/13/2023)   [employer] of Occupational Health - Occupational Stress Questionnaire    Feeling of Stress : Not at all   Social Connections: Moderately Isolated (11/03/2023)   Social Connection and Isolation Panel    Frequency of Communication with Friends and Family: More than three times a week    Frequency of Social Gatherings with Friends and Family: Twice a week    Attends Religious Services: More than 4 times per year    Active Member of [employer] or Organizations: No    Attends Banker Meetings: Never    Marital Status: Widowed  Intimate Partner Violence: Not At Risk (11/13/2023)   Humiliation, Afraid, Rape, and Kick questionnaire    Fear of Current or Ex-Partner: No    Emotionally Abused: No    Physically Abused: No    Sexually Abused: No    Review of Systems:  All other review of systems negative except as mentioned in the HPI.  Physical Exam: Vital signs BP (!) 142/94   Pulse 79   Temp 97.9 F (36.6 C)   Ht 6' (1.829 m)   Wt 180 lb (81.6 kg)   SpO2 99%   BMI 24.41 kg/m   General:   Alert,  Well-developed, well-nourished, pleasant and cooperative in NAD Lungs:  Clear throughout to auscultation.   Heart:  Regular rate and rhythm; no murmurs, clicks, rubs,  or gallops. Abdomen:  Soft, nontender and nondistended. Normal bowel sounds.   Neuro/Psych:  Alert and cooperative. Normal mood and affect. A and O x 3   @Mairely Foxworth  CHARLENA Commander, MD, [facility] [phone] (pager) 02/06/2024 11:15 AM@

## 2024-02-06 NOTE — Op Note (Signed)
 [facility] Patient Name: Russell [name] Procedure Date: 02/06/2024 11:09 AM MRN: [id number] Endoscopist: Lupita FORBES Commander , MD, [id number] Age: 78 Referring MD:  Date of Birth: 03-Aug-1945 Gender: Male Account #: [id number] Procedure:                Colonoscopy Indications:              High risk colon cancer surveillance: Personal                            history of colon cancer and Lynch Syndrome -                            ascending colon cancer resected 10/2022 and                            diagnosis of Lynch Syndrome made then Medicines:                Monitored Anesthesia Care Procedure:                Pre-Anesthesia Assessment:                           - Prior to the procedure, a History and Physical                            was performed, and patient medications and                            allergies were reviewed. The patient's tolerance of                            previous anesthesia was also reviewed. The risks                            and benefits of the procedure and the sedation                            options and risks were discussed with the patient.                            All questions were answered, and informed consent                            was obtained. Prior Anticoagulants: The patient has                            taken no anticoagulant or antiplatelet agents. ASA                            Grade Assessment: III - A patient with severe                            systemic disease. After reviewing the risks and  benefits, the patient was deemed in satisfactory                            condition to undergo the procedure.                           After obtaining informed consent, the colonoscope                            was passed under direct vision. Throughout the                            procedure, the patient's blood pressure, pulse, and                            oxygen saturations were monitored  continuously. The                            CF [id number] #[id number] was introduced through the anus                            and advanced to the the ileocolonic anastomosis.                            The colonoscopy was performed without difficulty.                            The patient tolerated the procedure well. The                            quality of the bowel preparation was good. The                            rectum and Ileocolonic anastomsis areas were                            photographed. The bowel preparation used was                            Miralax  via split dose instruction. Scope In: 11:39:24 AM Scope Out: 11:54:18 AM Scope Withdrawal Time: 0 hours 8 minutes 53 seconds  Total Procedure Duration: 0 hours 14 minutes 54 seconds  Findings:                 The perianal and digital rectal examinations were                            normal.                           Multiple small-mouthed diverticula were found in                            the sigmoid colon.  Internal hemorrhoids were found and some prior                            banding scars                           There was evidence of a prior end-to-side                            ileo-colonic anastomosis in the transverse colon.                            This was patent and was characterized by healthy                            appearing mucosa. The anastomosis was traversed.                           The exam was otherwise without abnormality on                            direct and retroflexion views. Complications:            No immediate complications. Estimated Blood Loss:     Estimated blood loss: none. Impression:               - Diverticulosis in the sigmoid colon.                           - Internal hemorrhoids and prior badning scars                           - Patent end-to-side ileo-colonic anastomosis,                            characterized by healthy appearing  mucosa.                           - The examination was otherwise normal on direct                            and retroflexion views.                           - No specimens collected. Recommendation:           - Patient has a contact number available for                            emergencies. The signs and symptoms of potential                            delayed complications were discussed with the                            patient. Return to normal activities tomorrow.  Written discharge instructions were provided to the                            patient.                           - Resume previous diet.                           - Continue present medications.                           - Patient has decided not to pursue repeat                            colonoscopy - was advised given Lynch Syndrome it                            is recommeded annually. Place 1 year recall and can                            revisit in case he changes his mind. Lupita FORBES Commander, MD 02/06/2024 12:16:57 PM This report has been signed electronically.

## 2024-02-06 NOTE — Progress Notes (Signed)
 A/o x 3, VSS, gd SR's, pleased with anesthesia, report to RN

## 2024-02-07 ENCOUNTER — Telehealth: Payer: Self-pay | Admitting: *Deleted

## 2024-02-07 NOTE — Telephone Encounter (Signed)
  Follow up Call-     02/06/2024   10:27 AM  Call back number  Post procedure Call Back phone  # [phone]  Permission to leave phone message Yes     Patient questions:  Do you have a fever, pain , or abdominal swelling? No. Pain Score  0 *  Have you tolerated food without any problems? Yes.    Have you been able to return to your normal activities? Yes.    Do you have any questions about your discharge instructions: Diet   No. Medications  No. Follow up visit  No.  Do you have questions or concerns about your Care? No.  Actions: * If pain score is 4 or above: No action needed, pain <4.

## 2024-02-10 LAB — SURGICAL PATHOLOGY

## 2024-02-13 ENCOUNTER — Ambulatory Visit: Payer: Self-pay | Admitting: Internal Medicine

## 2024-02-27 ENCOUNTER — Ambulatory Visit: Attending: Internal Medicine | Admitting: Internal Medicine

## 2024-02-27 ENCOUNTER — Other Ambulatory Visit (HOSPITAL_COMMUNITY)
Admission: RE | Admit: 2024-02-27 | Discharge: 2024-02-27 | Disposition: A | Discharge status: Home / Self Care | Source: Ambulatory Visit | Attending: Internal Medicine | Admitting: Internal Medicine

## 2024-02-27 ENCOUNTER — Encounter: Payer: Self-pay | Admitting: Internal Medicine

## 2024-02-27 VITALS — BP 110/68 | HR 66 | Ht 72.0 in | Wt 186.0 lb

## 2024-02-27 DIAGNOSIS — I77819 Aortic ectasia, unspecified site: Secondary | ICD-10-CM

## 2024-02-27 DIAGNOSIS — R002 Palpitations: Secondary | ICD-10-CM | POA: Diagnosis not present

## 2024-02-27 DIAGNOSIS — I471 Supraventricular tachycardia, unspecified: Secondary | ICD-10-CM | POA: Diagnosis not present

## 2024-02-27 DIAGNOSIS — I714 Abdominal aortic aneurysm, without rupture, unspecified: Secondary | ICD-10-CM

## 2024-02-27 DIAGNOSIS — I739 Peripheral vascular disease, unspecified: Secondary | ICD-10-CM

## 2024-02-27 DIAGNOSIS — E785 Hyperlipidemia, unspecified: Secondary | ICD-10-CM | POA: Diagnosis not present

## 2024-02-27 DIAGNOSIS — Z8249 Family history of ischemic heart disease and other diseases of the circulatory system: Secondary | ICD-10-CM

## 2024-02-27 DIAGNOSIS — Z8279 Family history of other congenital malformations, deformations and chromosomal abnormalities: Secondary | ICD-10-CM

## 2024-02-27 LAB — BASIC METABOLIC PANEL WITH GFR
Anion gap: 9 (ref 5–15)
BUN: 18 mg/dL (ref 8–23)
CO2: 27 mmol/L (ref 22–32)
Calcium: 9.2 mg/dL (ref 8.9–10.3)
Chloride: 103 mmol/L (ref 98–111)
Creatinine, Ser: 0.72 mg/dL (ref 0.61–1.24)
GFR, Estimated: >60 mL/min (ref >60)
Glucose, Bld: 103 mg/dL — ABNORMAL HIGH (ref 70–99)
Potassium: 4 mmol/L (ref 3.5–5.1)
Sodium: 139 mmol/L (ref 135–145)

## 2024-02-27 NOTE — Progress Notes (Signed)
 Cardiology Office Note  Date: 02/27/2024   ID: CHRISTROPHER GINTZ, DOB 06/14/1946, MRN [id number]  PCP:  [name], MD  Cardiologist:  Debby Sor, MD (Inactive) Electrophysiologist:  None   History of Present Illness: Russell [name] is a 78 y.o. male  Reports/records from prior cardiologist reviewed.  Discussed with the patient.  He does not have any symptoms of angina or DOE.  His wife passed away in 2025-03-25they were married for total duration of 54 years.  After his wife passed away, he developed cauda equina syndrome/symptoms.  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   anterior cer disc and fusion with plates R6-5 and C4-5 at [facility]   Chronic kidney disease    Colonic mass 09/29/2022   Constipation    Encounter for general adult medical examination with abnormal findings 06/27/2022   Enlarged prostate    GERD (gastroesophageal reflux disease)    Hemorrhoids    History of kidney stones    passed stones   History of lumbar laminectomy 05/12/2020   open L2-L5 at [facility]   Hx of colonic polyp 05/29/2018   05/2018 cecal ssp   Lung nodule 02/2021   Lyme disease    Lynch syndrome    Maxillary sinus polyp    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   Mixed hyperlipidemia 07/19/2023   Pneumonia    Prolapsed internal hemorrhoids, grade 3 2006/10/07   Qualifier: Diagnosis of  By: Karren MD, Cornelius     Salivary gland enlargement    Sleep apnea 10 years ago   declined CPAP   Stroke (HCC) 15-20 years ago    Past Surgical History:  Procedure Laterality Date   BIOPSY  09/28/2022   Procedure:  BIOPSY;  Surgeon: Legrand Victory LITTIE DOUGLAS, MD;  Location: [facility];  Service: Gastroenterology;;   BRONCHIAL BIOPSY  05/16/2021   Procedure: BRONCHIAL BIOPSIES;  Surgeon: Brenna Adine LITTIE, DO;  Location: [name];  Service: Pulmonary;;   BRONCHIAL BRUSHINGS  05/16/2021   Procedure: BRONCHIAL BRUSHINGS;  Surgeon: Brenna Adine LITTIE, DO;  Location: [name];  Service: Pulmonary;;   BRONCHIAL NEEDLE ASPIRATION BIOPSY  05/16/2021   Procedure: BRONCHIAL NEEDLE ASPIRATION BIOPSIES;  Surgeon: Brenna Adine LITTIE, DO;  Location: [name];  Service: Pulmonary;;   CERVICAL SPINE SURGERY  04/10/2020   C3-C4 with pin at [facility]   COLONOSCOPY     COLONOSCOPY WITH PROPOFOL N/A 09/28/2022   Procedure: COLONOSCOPY WITH PROPOFOL;  Surgeon: Legrand Victory LITTIE DOUGLAS, MD;  Location: [facility];  Service: Gastroenterology;  Laterality: N/A;   ESOPHAGOGASTRODUODENOSCOPY (EGD) WITH PROPOFOL N/A 09/28/2022   Procedure: ESOPHAGOGASTRODUODENOSCOPY (EGD) WITH PROPOFOL;  Surgeon: Legrand Victory LITTIE DOUGLAS, MD;  Location: [facility];  Service: Gastroenterology;  Laterality: N/A;   FIDUCIAL MARKER PLACEMENT  05/16/2021   Procedure: FIDUCIAL MARKER PLACEMENT;  Surgeon: Brenna Adine LITTIE, DO;  Location: [name];  Service: Pulmonary;;   HEMORRHOID BANDING     LUMBAR LAMINECTOMY  03/08/2010   L2-3, cauda equina syndrome   lumbar laminectomy  05/12/2020  [facility]   SPINE SURGERY     UPPER GI ENDOSCOPY     VIDEO BRONCHOSCOPY WITH ENDOBRONCHIAL NAVIGATION Left 05/16/2021   Procedure: VIDEO BRONCHOSCOPY WITH ENDOBRONCHIAL NAVIGATION;  Surgeon: Brenna Adine CROME, DO;  Location: [name];  Service: Pulmonary;  Laterality: Left;  ION w/ fiducial placement   VIDEO BRONCHOSCOPY WITH RADIAL ENDOBRONCHIAL ULTRASOUND  05/16/2021   Procedure: VIDEO BRONCHOSCOPY WITH RADIAL ENDOBRONCHIAL ULTRASOUND;  Surgeon: Brenna Adine CROME, DO;  Location: [name];  Service: Pulmonary;;   wisdom teeth ext      Current Outpatient Medications   Medication Sig Dispense Refill   aspirin EC 81 MG tablet Take 1 tablet (81 mg total) by mouth every evening. 30 tablet 12   finasteride (PROSCAR) 5 MG tablet Take 1 tablet (5 mg total) by mouth daily. 90 tablet 3   Ginger, Zingiber officinalis, (GINGER PO) Take by mouth.     iron polysaccharides (NIFEREX) 150 MG capsule Take 1 capsule (150 mg total) by mouth daily. (Patient taking differently: Take 1 capsule (150 mg total) by mouth daily.) 30 capsule 1   Magnesium 400 MG CAPS Take 400 mg by mouth 2 (two) times daily. asporotate     melatonin 5 MG TABS Take 5 mg by mouth at bedtime.     methylcellulose (CITRUCEL) oral powder Take by mouth.     metoprolol succinate (TOPROL-XL) 25 MG 24 hr tablet Take 1 tablet (25 mg total) by mouth daily. 90 tablet 3   Multiple Vitamin (MULTIVITAMIN) tablet Take 1 tablet by mouth daily.     Omega-3 Fatty Acids (FISH OIL PO) Take 460 mg by mouth 2 (two) times daily.     omeprazole (PRILOSEC) 40 MG capsule TAKE (1) CAPSULE BY MOUTH ONCE DAILY. 90 capsule 3   silodosin (RAPAFLO) 8 MG CAPS capsule Take 1 capsule (8 mg total) by mouth 2 (two) times daily. 60 capsule 11   TART CHERRY PO Take by mouth.     TURMERIC PO Take 500 mg by mouth 2 (two) times daily. curamed curcumin     No current facility-administered medications for this visit.   Allergies:  Patient has no known allergies.   Social History: The patient  reports that he quit smoking about 49 years ago. His smoking use included cigarettes. He started smoking about 58 years ago. He has a 2.3 pack-year smoking history. He has been exposed to tobacco smoke. He has never used smokeless tobacco. He reports that he does not currently use alcohol. He reports that he does not use drugs.   Family History: The patient's family history includes Aortic aneurysm in his mother, sister, sister, and sister; Arthritis in his father; Breast cancer in his paternal aunt; CVA in his sister; Early death in his sister; Heart  attack in his father; Varicose Veins in his mother.   ROS:  Please see the history of present illness. Otherwise, complete review of systems is positive for none.  All other systems are reviewed and negative.   Physical Exam: VS:  BP 110/68 (BP Location: Left Arm, Patient Position: Sitting, Cuff Size: Normal)   Pulse 66   Ht 6' (1.829 m)   Wt 186 lb (84.4 kg)   SpO2 98%   BMI 25.23 kg/m , BMI Body mass index is 25.23 kg/m.  Wt Readings from Last 3 Encounters:  02/27/24 186 lb (84.4 kg)  02/06/24 180 lb (81.6 kg)  01/14/24 180 lb (81.6 kg)    General: Patient appears comfortable at rest. HEENT: Conjunctiva and  lids normal, oropharynx clear with moist mucosa. Neck: Supple, no elevated JVP or carotid bruits, no thyromegaly. Lungs: Clear to auscultation, nonlabored breathing at rest. Cardiac: Regular rate and rhythm, no S3 or significant systolic murmur, no pericardial rub. Abdomen: Soft, nontender, no hepatomegaly, bowel sounds present, no guarding or rebound. Extremities: No pitting edema, distal pulses 2+. Skin: Warm and dry. Musculoskeletal: No kyphosis. Neuropsychiatric: Alert and oriented x3, affect grossly appropriate.  Recent Labwork: 07/19/2023: TSH 1.140 11/13/2023: ALT 18; AST 24; Hemoglobin 12.4; Platelets 235 02/27/2024: BUN 18; Creatinine, Ser 0.72; Potassium 4.0; Sodium 139     Component Value Date/Time   CHOL 165 07/19/2023 1031   TRIG 98 07/19/2023 1031   HDL 63 07/19/2023 1031   CHOLHDL 2.6 07/19/2023 1031   CHOLHDL 2.9 Ratio 02/10/2009 2017   VLDL 15 02/10/2009 2017   LDLCALC 84 07/19/2023 1031     Assessment and Plan:   Palpitations: Resolved with metoprolol.  Continue metoprolol succinate 25 mg once daily.  He drinks coffee and decaf as well.  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  Repeat ultrasound abdominal Doppler.  ABI within normal limits except for  noncompressible vessels in the right lower extremity.  Will repeat ABI as he reports claudication symptoms but he also thinks this could be part of cauda equina.  Family history of AAA in the mother: Patient does not have evidence of AAA by imaging.  Family history of ascending aortic aneurysm rupture in the siblings: Patient reported that one of his sisters carries Marfan syndrome gene and that she had a rupture of the ascending aortic aneurysm.  Per chart review, he did not undergo any CTA chest/aorta.  Echocardiogram obtained in 2023 showed borderline dilatation of the aortic root, 38 mm.  Will obtain CTA chest/aorta for definitive evaluation.  Elevated coronary calcium score: Coronary calcium score is 1.37, 11th percentile for age and sex matched control.  No angina or DOE.  No indication for any ischemia valuation at this time.   I spent 20 minutes reviewing prior records from his cardiologist, reports, labs (more than 3), imaging results.  I spent 20 minutes with the patient discussing about the aortic aneurysm, iliac artery dilatation.  10 minutes spent in documentation.  Medication Adjustments/Labs and Tests Ordered: Current medicines are reviewed at length with the patient today.  Concerns regarding medicines are outlined above.    Disposition:  Follow up 1 year  Signed, Reginaldo Hazard Arleta Maywood, MD, 02/27/2024 12:44 PM    [facility] [address] [place], [place] [address]

## 2024-02-27 NOTE — Patient Instructions (Addendum)
 Medication Instructions:  Your physician recommends that you continue on your current medications as directed. Please refer to the Current Medication list given to you today.   Labwork: BMET today  Testing/Procedures: Chest CT angio  Your physician has requested that you have an ankle brachial index (ABI). During this test an ultrasound and blood pressure cuff are used to evaluate the arteries that supply the arms and legs with blood. Allow thirty minutes for this exam. There are no restrictions or special instructions.  Please note: We ask at that you not bring children with you during ultrasound (echo/ vascular) testing. Due to room size and safety concerns, children are not allowed in the ultrasound rooms during exams. Our front office staff cannot provide observation of children in our lobby area while testing is being conducted. An adult accompanying a patient to their appointment will only be allowed in the ultrasound room at the discretion of the ultrasound technician under special circumstances. We apologize for any inconvenience.   Your physician has requested that you have an abdominal aorta duplex. During this test, an ultrasound is used to evaluate the aorta. Allow 30 minutes for this exam. Do not eat after midnight the day before and avoid carbonated beverages.  Please note: We ask at that you not bring children with you during ultrasound (echo/ vascular) testing. Due to room size and safety concerns, children are not allowed in the ultrasound rooms during exams. Our front office staff cannot provide observation of children in our lobby area while testing is being conducted. An adult accompanying a patient to their appointment will only be allowed in the ultrasound room at the discretion of the ultrasound technician under special circumstances. We apologize for any inconvenience.   Follow-Up: 1 year Dr.Mallipeddi  Any Other Special Instructions Will Be Listed Below (If  Applicable).  If you need a refill on your cardiac medications before your next appointment, please call your pharmacy.

## 2024-03-05 ENCOUNTER — Inpatient Hospital Stay: Attending: Hematology

## 2024-03-05 DIAGNOSIS — Z79899 Other long term (current) drug therapy: Secondary | ICD-10-CM | POA: Insufficient documentation

## 2024-03-05 DIAGNOSIS — C182 Malignant neoplasm of ascending colon: Secondary | ICD-10-CM | POA: Diagnosis not present

## 2024-03-05 DIAGNOSIS — C189 Malignant neoplasm of colon, unspecified: Secondary | ICD-10-CM

## 2024-03-05 LAB — FERRITIN: Ferritin: 26 ng/mL (ref 24–336)

## 2024-03-05 LAB — CBC WITH DIFFERENTIAL/PLATELET
Abs Immature Granulocytes: 0.03 K/uL (ref 0.00–0.07)
Basophils Absolute: 0 K/uL (ref 0.0–0.1)
Basophils Relative: 0 %
Eosinophils Absolute: 0.1 K/uL (ref 0.0–0.5)
Eosinophils Relative: 1 %
HCT: 40.4 % (ref 39.0–52.0)
Hemoglobin: 13.3 g/dL (ref 13.0–17.0)
Immature Granulocytes: 0 %
Lymphocytes Relative: 21 %
Lymphs Abs: 1.5 K/uL (ref 0.7–4.0)
MCH: 26.1 pg (ref 26.0–34.0)
MCHC: 32.9 g/dL (ref 30.0–36.0)
MCV: 79.2 fL — ABNORMAL LOW (ref 80.0–100.0)
Monocytes Absolute: 0.6 K/uL (ref 0.1–1.0)
Monocytes Relative: 9 %
Neutro Abs: 4.9 K/uL (ref 1.7–7.7)
Neutrophils Relative %: 69 %
Platelets: 209 K/uL (ref 150–400)
RBC: 5.1 MIL/uL (ref 4.22–5.81)
RDW: 14.5 % (ref 11.5–15.5)
WBC: 7.1 K/uL (ref 4.0–10.5)
nRBC: 0 % (ref 0.0–0.2)

## 2024-03-05 LAB — COMPREHENSIVE METABOLIC PANEL WITH GFR
ALT: 18 U/L (ref 0–44)
AST: 22 U/L (ref 15–41)
Albumin: 3.9 g/dL (ref 3.5–5.0)
Alkaline Phosphatase: 47 U/L (ref 38–126)
Anion gap: 9 (ref 5–15)
BUN: 18 mg/dL (ref 8–23)
CO2: 27 mmol/L (ref 22–32)
Calcium: 9.4 mg/dL (ref 8.9–10.3)
Chloride: 104 mmol/L (ref 98–111)
Creatinine, Ser: 0.66 mg/dL (ref 0.61–1.24)
GFR, Estimated: >60 mL/min (ref >60)
Glucose, Bld: 105 mg/dL — ABNORMAL HIGH (ref 70–99)
Potassium: 3.6 mmol/L (ref 3.5–5.1)
Sodium: 140 mmol/L (ref 135–145)
Total Bilirubin: 1 mg/dL (ref 0.0–1.2)
Total Protein: 7.2 g/dL (ref 6.5–8.1)

## 2024-03-05 LAB — IRON AND TIBC
Iron: 94 ug/dL (ref 45–182)
Saturation Ratios: 29 % (ref 17.9–39.5)
TIBC: 323 ug/dL (ref 250–450)
UIBC: 229 ug/dL

## 2024-03-06 LAB — CEA: CEA: 3.6 ng/mL (ref 0.0–4.7)

## 2024-03-12 ENCOUNTER — Inpatient Hospital Stay: Admitting: Oncology

## 2024-03-12 ENCOUNTER — Ambulatory Visit (HOSPITAL_COMMUNITY)
Admission: RE | Admit: 2024-03-12 | Discharge: 2024-03-12 | Disposition: A | Discharge status: Home / Self Care | Source: Ambulatory Visit | Attending: Internal Medicine | Admitting: Internal Medicine

## 2024-03-12 DIAGNOSIS — J479 Bronchiectasis, uncomplicated: Secondary | ICD-10-CM | POA: Diagnosis not present

## 2024-03-12 DIAGNOSIS — K7689 Other specified diseases of liver: Secondary | ICD-10-CM | POA: Diagnosis not present

## 2024-03-12 DIAGNOSIS — I77819 Aortic ectasia, unspecified site: Secondary | ICD-10-CM | POA: Insufficient documentation

## 2024-03-12 DIAGNOSIS — R911 Solitary pulmonary nodule: Secondary | ICD-10-CM | POA: Diagnosis not present

## 2024-03-12 DIAGNOSIS — J984 Other disorders of lung: Secondary | ICD-10-CM | POA: Diagnosis not present

## 2024-03-12 MED ORDER — IOHEXOL 350 MG/ML SOLN
100.0000 mL | Freq: Once | INTRAVENOUS | Status: AC | PRN
  Administered 2024-03-12: 100 mL via INTRAVENOUS

## 2024-03-12 NOTE — Progress Notes (Deleted)
 [name] OFFICE PROGRESS NOTE  [name], MD  ASSESSMENT & PLAN:    Assessment & Plan   No orders of the defined types were placed in this encounter.   INTERVAL HISTORY: Patient returns for follow-up.  We reviewed ***  SUMMARY OF HEMATOLOGIC HISTORY: Oncology History  Cancer of right colon (HCC)  11/08/2022 Initial Diagnosis   Cancer of right colon (HCC)   11/12/2023 Cancer Staging   Staging form: Colon and Rectum, AJCC 8th Edition - Clinical stage from 11/12/2023: Stage IIA (cT3, cN0, cM0) - Signed by Rogers Hai, MD on 11/12/2023 Histopathologic type: Adenocarcinoma, NOS Stage prefix: Initial diagnosis Total positive nodes: 0 Total nodes examined: 16 Histologic grade (G): G2 Histologic grading system: 4 grade system Laterality: Right Tumor size (mm): 25 Lymph-vascular invasion (LVI): LVI not present (absent)/not identified Specimen type: Excision Tumor deposits (TD): Absent Carcinoembryonic antigen (CEA) (ng/mL): 2.1     1.  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  Colonic mass biopsy invasive moderately differentiated adenocarcinoma. - CT CAP (09/29/2022): No evidence of lymphadenopathy or metastatic disease in the chest, abdomen and pelvis. - CEA (09/29/2022): 2.1 - Right hemicolectomy on 11/08/2022 by Dr. Debby - Pathology: Invasive moderately differentiated adenocarcinoma, 2.5 cm, focally extending into pericolonic connective tissue (pT3), margins negative, 0/16 lymph nodes involved.  Negative for LVI/PNI.  Loss of nuclear expression of PMS2.   2.  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  Social/family history: - His wife passed away in 01/19/25from ovarian cancer.  He lives at home by himself and is independent of ADLs and IADLs.  Walks about 1 and half mile per day.  Smoked half pack cigarettes from 1967-76 and quit.  No prior asbestos exposure.  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 - No family history of malignancies.   CBC    Component Value Date/Time   WBC 7.1 03/05/2024 1340   RBC 5.10 03/05/2024 1340   HGB 13.3 03/05/2024 1340   HGB 11.4 (L) 07/19/2023 1031   HCT 40.4 03/05/2024 1340   HCT 38.0 07/19/2023 1031   PLT 209 03/05/2024 1340   PLT 219 07/19/2023 1031   MCV 79.2 (L) 03/05/2024 1340   MCV 75 (L) 07/19/2023 1031   MCH 26.1 03/05/2024 1340   MCHC 32.9 03/05/2024 1340   RDW 14.5 03/05/2024 1340   RDW 18.2 (H) 07/19/2023 1031   LYMPHSABS 1.5 03/05/2024 1340   LYMPHSABS 1.1 07/19/2023 1031   MONOABS 0.6 03/05/2024 1340   EOSABS 0.1 03/05/2024 1340   EOSABS 0.1 07/19/2023 1031   BASOSABS 0.0 03/05/2024 1340   BASOSABS 0.0 07/19/2023 1031       Latest Ref Rng & Units 03/05/2024    1:40 PM 02/27/2024   11:22 AM 11/13/2023    8:57 AM  CMP  Glucose 70 - 99 mg/dL 894  896  887   BUN 8 - 23 mg/dL 18  18  17    Creatinine 0.61 - 1.24 mg/dL 9.33  9.27  9.33   Sodium 135 - 145 mmol/L 140  139  137   Potassium 3.5 - 5.1 mmol/L 3.6  4.0  4.3   Chloride 98 - 111 mmol/L  104  103  102   CO2 22 - 32 mmol/L 27  27  27    Calcium 8.9 - 10.3 mg/dL 9.4  9.2  9.2   Total Protein 6.5 - 8.1 g/dL 7.2   7.3   Total Bilirubin 0.0 - 1.2 mg/dL 1.0   1.1   Alkaline Phos 38 - 126 U/L 47   44   AST 15 - 41 U/L 22   24   ALT 0 - 44 U/L 18   18      Lab Results  Component Value Date   FERRITIN 26 03/05/2024   VITAMINB12 423 07/19/2023    There were no vitals filed for this visit.  Review of System:  ROS  Physical Exam: Physical Exam Constitutional:      Appearance: Normal appearance.  HENT:     Head:  Normocephalic and atraumatic.  Eyes:     Pupils: Pupils are equal, round, and reactive to light.  Cardiovascular:     Rate and Rhythm: Normal rate and regular rhythm.     Heart sounds: Normal heart sounds. No murmur heard. Pulmonary:     Effort: Pulmonary effort is normal.     Breath sounds: Normal breath sounds. No wheezing.  Abdominal:     General: Bowel sounds are normal. There is no distension.     Palpations: Abdomen is soft.     Tenderness: There is no abdominal tenderness.  Musculoskeletal:        General: Normal range of motion.     Cervical back: Normal range of motion.  Skin:    General: Skin is warm and dry.     Findings: No rash.  Neurological:     Mental Status: He is alert and oriented to person, place, and time.     Gait: Gait is intact.  Psychiatric:        Mood and Affect: Mood and affect normal.        Cognition and Memory: Memory normal.        Judgment: Judgment normal.      I spent *** minutes dedicated to the care of this patient (face-to-face and non-face-to-face) on the date of the encounter to include what is described in the assessment and plan.,  Russell Hope, NP 03/12/2024 12:59 PM

## 2024-03-19 ENCOUNTER — Ambulatory Visit: Payer: Self-pay | Admitting: Internal Medicine

## 2024-03-20 NOTE — Telephone Encounter (Signed)
 Patient returned RN's call regarding results.

## 2024-03-23 ENCOUNTER — Encounter

## 2024-03-23 ENCOUNTER — Other Ambulatory Visit

## 2024-03-23 DIAGNOSIS — M9905 Segmental and somatic dysfunction of pelvic region: Secondary | ICD-10-CM | POA: Diagnosis not present

## 2024-03-23 DIAGNOSIS — M5442 Lumbago with sciatica, left side: Secondary | ICD-10-CM | POA: Diagnosis not present

## 2024-03-23 DIAGNOSIS — M9902 Segmental and somatic dysfunction of thoracic region: Secondary | ICD-10-CM | POA: Diagnosis not present

## 2024-03-23 DIAGNOSIS — M9903 Segmental and somatic dysfunction of lumbar region: Secondary | ICD-10-CM | POA: Diagnosis not present

## 2024-04-08 ENCOUNTER — Ambulatory Visit: Payer: Medicare PPO

## 2024-04-08 VITALS — Ht 72.0 in | Wt 180.0 lb

## 2024-04-08 DIAGNOSIS — Z0000 Encounter for general adult medical examination without abnormal findings: Secondary | ICD-10-CM

## 2024-04-08 NOTE — Progress Notes (Addendum)
 Subjective:   Russell [name] is a 78 y.o. who presents for a Medicare Wellness preventive visit.  As a reminder, Annual Wellness Visits don't include a physical exam, and some assessments may be limited, especially if this visit is performed virtually. We may recommend an in-person follow-up visit with your provider if needed.  Visit Complete: Virtual I connected with  Russell [name] on 04/08/24 by a video and audio enabled telemedicine application and verified that I am speaking with the correct person using two identifiers.  Patient Location: Home  Provider Location: Home Office  I discussed the limitations of evaluation and management by telemedicine. The patient expressed understanding and agreed to proceed.  Vital Signs: Because this visit was a virtual/telehealth visit, some criteria may be missing or patient reported. Any vitals not documented were not able to be obtained and vitals that have been documented are patient reported.  Persons Participating in Visit: Patient.  AWV Questionnaire: No: Patient Medicare AWV questionnaire was not completed prior to this visit.  Cardiac Risk Factors include: advanced age (>53men, >46 women);sedentary lifestyle;male gender     Objective:    Today's Vitals   04/08/24 1607  Weight: 180 lb (81.6 kg)  Height: 6' (1.829 m)   Body mass index is 24.41 kg/m.     12/03/2023    2:05 PM 04/04/2023    8:11 AM 11/08/2022    6:00 PM 11/08/2022    7:03 AM 10/26/2022   10:32 AM 10/03/2022   10:35 AM 09/27/2022    4:00 PM  Advanced Directives  Does Patient Have a Medical Advance Directive? No Yes Yes  Yes Yes Yes  Type of Special educational needs teacher of [place];Living will Healthcare Power of [employer] of [place];Living will Healthcare Power of [place];Living will Healthcare Power of [employer] Power of Attorney  Does patient want to make changes to medical advance directive?   No - Patient declined    No -  Patient declined  Copy of Healthcare Power of Attorney in Chart?  No - copy requested Yes - validated most recent copy scanned in chart (See row information)  Yes - validated most recent copy scanned in chart (See row information)    Would patient like information on creating a medical advance directive? No - Patient declined          Current Medications (verified) Outpatient Encounter Medications as of 04/08/2024  Medication Sig   aspirin  EC 81 MG tablet Take 1 tablet (81 mg total) by mouth every evening.   finasteride  (PROSCAR ) 5 MG tablet Take 1 tablet (5 mg total) by mouth daily.   Ginger, Zingiber officinalis, (GINGER PO) Take by mouth.   iron  polysaccharides (NIFEREX) 150 MG capsule Take 1 capsule (150 mg total) by mouth daily. (Patient taking differently: Take 1 capsule (150 mg total) by mouth daily.)   Magnesium 400 MG CAPS Take 400 mg by mouth 2 (two) times daily. asporotate   melatonin 5 MG TABS Take 5 mg by mouth at bedtime.   methylcellulose (CITRUCEL) oral powder Take by mouth.   metoprolol  succinate (TOPROL -XL) 25 MG 24 hr tablet Take 1 tablet (25 mg total) by mouth daily.   Multiple Vitamin (MULTIVITAMIN) tablet Take 1 tablet by mouth daily.   Omega-3 Fatty Acids (FISH OIL PO) Take 460 mg by mouth 2 (two) times daily.   omeprazole  (PRILOSEC) 40 MG capsule TAKE (1) CAPSULE BY MOUTH ONCE DAILY.   silodosin  (RAPAFLO ) 8 MG CAPS capsule Take 1  capsule (8 mg total) by mouth 2 (two) times daily.   TART CHERRY PO Take by mouth.   TURMERIC PO Take 500 mg by mouth 2 (two) times daily. curamed curcumin   No facility-administered encounter medications on file as of 04/08/2024.    Allergies (verified) Patient has no known allergies.   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   anterior cer disc and fusion with plates R6-5 and C4-5 at [facility]   Chronic kidney disease    Colon cancer (HCC)    Colonic mass 09/29/2022   Constipation    Encounter for general adult medical examination with abnormal findings 06/27/2022   Enlarged prostate    GERD (gastroesophageal reflux disease)    Hemorrhoids    History of kidney stones    passed stones   History of lumbar laminectomy 05/12/2020   open L2-L5 at [facility]   Hx of colonic polyp 05/29/2018   05/2018 cecal ssp   Lung cancer (HCC)    Lung nodule 02/2021   Lyme disease    Lynch syndrome    Maxillary sinus polyp    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   Mixed hyperlipidemia 07/19/2023   Pneumonia    Prolapsed internal hemorrhoids, grade 3 09/10/2006   Qualifier: Diagnosis of  By: Karren MD, Cornelius     Salivary gland enlargement    Sleep apnea 10 years ago   declined CPAP   Small intestine cancer (HCC)    Stroke (HCC) 15-20 years ago   Past Surgical History:  Procedure Laterality Date   BIOPSY  09/28/2022   Procedure: BIOPSY;  Surgeon: Legrand Victory LITTIE DOUGLAS, MD;  Location: [facility];  Service: Gastroenterology;;   BRONCHIAL BIOPSY  05/16/2021   Procedure: BRONCHIAL BIOPSIES;  Surgeon: Brenna Adine LITTIE, DO;  Location: [name];  Service: Pulmonary;;   BRONCHIAL BRUSHINGS  05/16/2021   Procedure: BRONCHIAL BRUSHINGS;  Surgeon: Brenna Adine LITTIE, DO;  Location: [name];  Service: Pulmonary;;   BRONCHIAL NEEDLE ASPIRATION BIOPSY  05/16/2021   Procedure: BRONCHIAL NEEDLE ASPIRATION BIOPSIES;  Surgeon: Brenna Adine LITTIE, DO;  Location: [name];  Service: Pulmonary;;   CERVICAL SPINE SURGERY  04/10/2020   C3-C4 with pin at [facility]   COLON SURGERY  11-09-2022   COLONOSCOPY     COLONOSCOPY WITH PROPOFOL  N/A 09/28/2022   Procedure:  COLONOSCOPY WITH PROPOFOL ;  Surgeon: Legrand Victory LITTIE DOUGLAS, MD;  Location: [facility];  Service: Gastroenterology;  Laterality: N/A;   ESOPHAGOGASTRODUODENOSCOPY (EGD) WITH PROPOFOL  N/A 09/28/2022   Procedure: ESOPHAGOGASTRODUODENOSCOPY (EGD) WITH PROPOFOL ;  Surgeon: Legrand Victory LITTIE DOUGLAS, MD;  Location: [facility];  Service: Gastroenterology;  Laterality: N/A;   FIDUCIAL MARKER PLACEMENT  05/16/2021   Procedure: FIDUCIAL MARKER PLACEMENT;  Surgeon: Brenna Adine LITTIE, DO;  Location: [name];  Service: Pulmonary;;   HEMORRHOID BANDING     LUMBAR LAMINECTOMY  03/08/2010   L2-3, cauda equina syndrome   lumbar laminectomy  05/12/2020   [place] [facility]   SMALL INTESTINE SURGERY  11-09-2022   SPINE SURGERY  UPPER GI ENDOSCOPY     VIDEO BRONCHOSCOPY WITH ENDOBRONCHIAL NAVIGATION Left 05/16/2021   Procedure: VIDEO BRONCHOSCOPY WITH ENDOBRONCHIAL NAVIGATION;  Surgeon: Brenna Adine CROME, DO;  Location: [name];  Service: Pulmonary;  Laterality: Left;  ION w/ fiducial placement   VIDEO BRONCHOSCOPY WITH RADIAL ENDOBRONCHIAL ULTRASOUND  05/16/2021   Procedure: VIDEO BRONCHOSCOPY WITH RADIAL ENDOBRONCHIAL ULTRASOUND;  Surgeon: Brenna Adine CROME, DO;  Location: [name];  Service: Pulmonary;;   wisdom teeth ext     Family History  Problem Relation Age of Onset   Aortic aneurysm Mother    Varicose Veins Mother    Heart attack Father    Arthritis Father    Aortic aneurysm Sister    Early death Sister    CVA Sister    Aortic aneurysm Sister    Aortic aneurysm Sister    Breast cancer Paternal Aunt    Colon cancer Neg Hx    Pancreatic cancer Neg Hx    Rectal cancer Neg Hx    Stomach cancer Neg Hx    Esophageal cancer Neg Hx    Colon polyps Neg Hx    Social History   Socioeconomic History   Marital status: Widowed    Spouse name: Not on file   Number of children: 0   Years of education: Not on file   Highest education level: Master's degree (e.g., MA, MS, MEng, MEd, MSW, MBA)  Occupational  History   Occupation: retired  Tobacco Use   Smoking status: Former    Current packs/day: 0.00    Average packs/day: 0.3 packs/day for 9.0 years (2.3 ttl pk-yrs)    Types: Cigarettes    Start date: 10/15/1965    Quit date: 10/16/1974    Years since quitting: 49.5    Passive exposure: Past   Smokeless tobacco: Never  Vaping Use   Vaping status: Never Used  Substance and Sexual Activity   Alcohol use: Not Currently   Drug use: No   Sexual activity: Not Currently  Other Topics Concern   Not on file  Social History Narrative   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   Social Drivers of Corporate investment banker Strain: Low Risk  (04/08/2024)   Overall Financial Resource Strain (CARDIA)    Difficulty of Paying Living Expenses: Not hard at all  Food Insecurity: No Food Insecurity (04/08/2024)   Hunger Vital Sign    Worried About Running Out of Food in the Last Year: Never true    Ran Out of Food in the Last Year: Never true  Transportation Needs: No Transportation Needs (04/08/2024)   PRAPARE - Administrator, Civil Service (Medical): No    Lack of Transportation (Non-Medical): No  Physical Activity: Insufficiently Active (04/08/2024)   Exercise Vital Sign    Days of Exercise per Week: 1 day    Minutes of Exercise per Session: 20 min  Stress: No Stress Concern Present (04/08/2024)   [employer] of Occupational Health - Occupational Stress Questionnaire    Feeling of Stress: Not at all  Social Connections: Moderately Isolated (04/08/2024)   Social Connection and Isolation Panel    Frequency of Communication with Friends and Family: Three times a week    Frequency of Social Gatherings  with Friends and Family: Twice a week    Attends Religious Services: More than 4 times per year    Active  Member of Clubs or Organizations: No    Attends Banker Meetings: Never    Marital Status: Widowed    Tobacco Counseling Counseling given: Yes    Clinical Intake:  Pre-visit preparation completed: Yes  Pain : No/denies pain     BMI - recorded: 24.41 Nutritional Status: BMI of 19-24  Normal Nutritional Risks: None Diabetes: No  Lab Results  Component Value Date   HGBA1C 5.6 09/29/2019     How often do you need to have someone help you when you read instructions, pamphlets, or other written materials from your doctor or pharmacy?: 1 - Never  Interpreter Needed?: No  Information entered by :: Halie Gass W CMA (AAMA)   Activities of Daily Living     04/08/2024    4:18 PM  In your present state of health, do you have any difficulty performing the following activities:  Hearing? 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  Vision? 0  Difficulty concentrating or making decisions? 0  Walking or climbing stairs? 1  Dressing or bathing? 0  Doing errands, shopping? 0  Preparing Food and eating ? N  Using the Toilet? N  In the past six months, have you accidently leaked urine? N  Do you have problems with loss of bowel control? N  Managing your Medications? Y  Managing your Finances? Y  Housekeeping or managing your Housekeeping? N    Patient Care Team: [name], MD as PCP - General (Internal Medicine) Burnard Debby LABOR, MD (Inactive) as PCP - Cardiology (Cardiology) Livingston Rigg, MD as Consulting Physician (Dermatology)  I have updated your Care Teams any recent Medical Services you may have received from other providers in the past year.     Assessment:   This is a routine wellness examination for [place].  Hearing/Vision screen Hearing Screening - Comments:: Patient complains of difficulty with hearing. Declines referral for testing Vision Screening - Comments:: Wears rx glasses - up to date with routine eye exams with  Willma Moats in  [place]   Goals Addressed             This Visit's Progress    Patient Stated   On track    Maintain my current health status. Watch my weight and stay active.     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 0 0 1 0 0  Number falls in past yr: 0 0 1 0 0  Injury with Fall? 0 0 1 0 0  Risk for fall due to :  Impaired balance/gait;Impaired mobility;Other (Comment) No Fall Risks History of fall(s) No Fall Risks No Fall Risks  Risk for fall due to: Comment wheelchair      Follow up Falls prevention discussed;Education provided;Falls evaluation completed Falls evaluation completed Falls evaluation completed Falls evaluation completed Falls evaluation completed    MEDICARE RISK AT HOME:  Medicare Risk at Home Any stairs in or around the home?: Yes If so, are there any without handrails?: No Home free of loose throw rugs in walkways, pet beds, electrical cords, etc?: Yes Adequate lighting in your home to reduce risk of falls?: Yes Life alert?: No Use of a cane, walker or w/c?: Yes Grab bars in the bathroom?: Yes Shower chair or bench in shower?: Yes Elevated toilet seat or a handicapped toilet?: Yes  TIMED UP AND GO:  Was the test performed?  No  Cognitive Function: 6CIT completed    01/31/2022    2:38 PM  MMSE - Mini Mental State Exam  Not completed: Unable to complete        04/08/2024    4:26 PM 04/04/2023    8:12 AM 01/31/2022    2:38 PM  6CIT Screen  What Year? 0 points 0 points 0 points  What month? 0 points 0 points 0  points  What time? 0 points 0 points 0 points  Count back from 20 0 points 0 points 0 points  Months in reverse 0 points 0 points 0 points  Repeat phrase 0 points 0 points 0 points  Total Score 0 points 0 points 0 points    Immunizations Immunization History  Administered Date(s) Administered    sv, Bivalent, Protein Subunit Rsvpref,pf (Abrysvo) 06/13/2022   Fluad Quad(high Dose 65+) 04/11/2022, 05/03/2023   INFLUENZA, HIGH DOSE SEASONAL PF 03/31/2019, 04/08/2020, 05/03/2023   Influenza Split 05/18/2014   Influenza Whole 04/28/2008   Influenza,inj,Quad PF,6+ Mos 05/01/2019   Influenza,inj,quad, With Preservative 05/13/2018   Influenza-Unspecified 05/13/2018, 04/29/2021, 05/05/2022   Moderna Covid-19 Fall Seasonal Vaccine 38yrs & older 06/13/2022   Moderna SARS-COV2 Booster Vaccination 06/01/2020   Moderna Sars-Covid-2 Vaccination 08/21/2019, 09/19/2019, 06/01/2020   Pneumococcal Conjugate-13 10/09/2013   Pneumococcal-Unspecified 10/09/2013   Td 04/28/2008   Tdap 04/28/2008, 06/22/2021   Zoster Recombinant(Shingrix) 12/21/2014, 01/15/2022   Zoster, Live 12/21/2014   Zoster, Unspecified 12/21/2014    Screening Tests Health Maintenance  Topic Date Due   Pneumococcal Vaccine: 50+ Years (2 of 2 - PCV20 or PCV21) 10/10/2014   Influenza Vaccine  02/14/2024   COVID-19 Vaccine (4 - 2025-26 season) 03/16/2024   Colonoscopy  02/05/2025   Medicare Annual Wellness (AWV)  04/08/2025   DTaP/Tdap/Td (4 - Td or Tdap) 06/23/2031   Hepatitis C Screening  Completed   Zoster Vaccines- Shingrix  Completed   HPV VACCINES  Aged Out   Meningococcal B Vaccine  Aged Out   Fecal DNA (Cologuard)  Discontinued    Health Maintenance Health Maintenance Due  Topic Date Due   Pneumococcal Vaccine: 50+ Years (2 of 2 - PCV20 or PCV21) 10/10/2014   Influenza Vaccine  02/14/2024   COVID-19 Vaccine (4 - 2025-26 season) 03/16/2024   Health Maintenance Items Addressed: Patient scheduled for a nurse  visit to get flu vaccine.    Additional Screening:  Vision Screening: Recommended annual ophthalmology exams for early detection of glaucoma and other disorders of the eye. Would you like a referral to an eye doctor? No    Dental Screening: Recommended annual dental exams for proper oral hygiene  Community Resource Referral /  Chronic Care Management: CRR required this visit?  No   CCM required this visit?  No   Plan:    I have personally reviewed and noted the following in the patient's chart:   Medical and social history Use of alcohol, tobacco or illicit drugs  Current medications and supplements including opioid prescriptions. Patient is not currently taking opioid prescriptions. Functional ability and status Nutritional status Physical activity Advanced directives List of other physicians Hospitalizations, surgeries, and ER visits in previous 12 months Vitals Screenings to include cognitive, depression, and falls Referrals and appointments  In addition, I have reviewed and discussed with patient certain preventive protocols, quality metrics, and best practice recommendations. A written personalized care plan for preventive services as well as general preventive health recommendations were provided to patient.   Zalen Sequeira, CMA   04/08/2024   After Visit Summary: (MyChart) Due to this being a telephonic visit, the after visit summary with patients personalized plan was offered to patient via MyChart   Notes: Nothing significant to report at this time.

## 2024-04-08 NOTE — Patient Instructions (Addendum)
 [name],  Flu Vaccine: April 15, 2024 at 10:30 am Follow up with Health Advisor: Medicare Wellness Video Visit on April 13, 2025 at 1:50 pm  Thank you for taking the time for your Medicare Wellness Visit. I appreciate your continued commitment to your health goals. Please review the care plan we discussed, and feel free to reach out if I can assist you further.  Medicare recommends these wellness visits once per year to help you and your care team stay ahead of potential health issues. These visits are designed to focus on prevention, allowing your provider to concentrate on managing your acute and chronic conditions during your regular appointments.  Please note that Annual Wellness Visits do not include a physical exam. Some assessments may be limited, especially if the visit was conducted virtually. If needed, we may recommend a separate in-person follow-up with your provider.  No Referrals were placed during today's visit.   Wishing you excellent health and many blessings in the year to come!  -Russell [name], CMA  Ongoing Care Seeing your primary care provider every 3 to 6 months helps us  monitor your health and provide consistent, personalized care.   Recommended Screenings:  Health Maintenance  Topic Date Due   Pneumococcal Vaccine for age over 12 (2 of 2 - PCV20 or PCV21) 10/10/2014   Flu Shot  02/14/2024   COVID-19 Vaccine (4 - 2025-26 season) 03/16/2024   Colon Cancer Screening  02/05/2025   Medicare Annual Wellness Visit  04/08/2025   DTaP/Tdap/Td vaccine (4 - Td or Tdap) 06/23/2031   Hepatitis C Screening  Completed   Zoster (Shingles) Vaccine  Completed   HPV Vaccine  Aged Out   Meningitis B Vaccine  Aged Out   Cologuard (Stool DNA test)  Discontinued       12/03/2023    2:05 PM  Advanced Directives  Does Patient Have a Medical Advance Directive? No  Would patient like information on creating a medical advance directive? No - Patient declined   Advance Care Planning  is important because it: Ensures you receive medical care that aligns with your values, goals, and preferences. Provides guidance to your family and loved ones, reducing the emotional burden of decision-making during critical moments.  Vision: Annual vision screenings are recommended for early detection of glaucoma, cataracts, and diabetic retinopathy. These exams can also reveal signs of chronic conditions such as diabetes and high blood pressure.  Dental: Annual dental screenings help detect early signs of oral cancer, gum disease, and other conditions linked to overall health, including heart disease and diabetes.  Please see the attached documents for additional preventive care recommendations.

## 2024-04-09 ENCOUNTER — Ambulatory Visit (INDEPENDENT_AMBULATORY_CARE_PROVIDER_SITE_OTHER)

## 2024-04-09 ENCOUNTER — Ambulatory Visit: Attending: Internal Medicine

## 2024-04-09 DIAGNOSIS — I739 Peripheral vascular disease, unspecified: Secondary | ICD-10-CM

## 2024-04-09 DIAGNOSIS — I714 Abdominal aortic aneurysm, without rupture, unspecified: Secondary | ICD-10-CM

## 2024-04-09 LAB — VAS US ABI WITH/WO TBI
Left ABI: 1.27
Right ABI: 1.35

## 2024-04-15 ENCOUNTER — Ambulatory Visit (INDEPENDENT_AMBULATORY_CARE_PROVIDER_SITE_OTHER)

## 2024-04-15 DIAGNOSIS — Z23 Encounter for immunization: Secondary | ICD-10-CM | POA: Diagnosis not present

## 2024-04-15 NOTE — Progress Notes (Signed)
 Patient is in office today for a nurse visit for flu Immunization. Patient Injection was given in the  Left deltoid. Patient tolerated injection well.

## 2024-04-17 ENCOUNTER — Other Ambulatory Visit: Payer: Medicare PPO

## 2024-04-17 DIAGNOSIS — R972 Elevated prostate specific antigen [PSA]: Secondary | ICD-10-CM | POA: Diagnosis not present

## 2024-04-18 LAB — PSA: Prostate Specific Ag, Serum: 1.4 ng/mL (ref 0.0–4.0)

## 2024-04-21 ENCOUNTER — Ambulatory Visit: Payer: Self-pay | Admitting: Urology

## 2024-04-24 ENCOUNTER — Ambulatory Visit: Payer: Medicare PPO | Admitting: Urology

## 2024-04-24 VITALS — BP 122/77 | HR 82

## 2024-04-24 DIAGNOSIS — N401 Enlarged prostate with lower urinary tract symptoms: Secondary | ICD-10-CM

## 2024-04-24 DIAGNOSIS — N138 Other obstructive and reflux uropathy: Secondary | ICD-10-CM

## 2024-04-24 DIAGNOSIS — R338 Other retention of urine: Secondary | ICD-10-CM | POA: Diagnosis not present

## 2024-04-24 DIAGNOSIS — R972 Elevated prostate specific antigen [PSA]: Secondary | ICD-10-CM

## 2024-04-24 DIAGNOSIS — R339 Retention of urine, unspecified: Secondary | ICD-10-CM

## 2024-04-24 LAB — URINALYSIS, ROUTINE W REFLEX MICROSCOPIC
Bilirubin, UA: NEGATIVE
Glucose, UA: NEGATIVE
Ketones, UA: NEGATIVE
Leukocytes,UA: NEGATIVE
Nitrite, UA: NEGATIVE
Protein,UA: NEGATIVE
RBC, UA: NEGATIVE
Specific Gravity, UA: 1.01 (ref 1.005–1.030)
Urobilinogen, Ur: 0.2 mg/dL (ref 0.2–1.0)
pH, UA: 7.5 (ref 5.0–7.5)

## 2024-04-24 MED ORDER — SILODOSIN 8 MG PO CAPS: 8.0000 mg | ORAL_CAPSULE | Freq: Two times a day (BID) | ORAL | 11 refills | Status: AC

## 2024-04-24 MED ORDER — FINASTERIDE 5 MG PO TABS: 5.0000 mg | ORAL_TABLET | Freq: Every day | ORAL | 3 refills | Status: AC

## 2024-04-24 MED ORDER — NITROGLYCERIN 0.4 % RE OINT: 1.0000 g | TOPICAL_OINTMENT | RECTAL | 5 refills | Status: AC | PRN

## 2024-04-24 MED ORDER — TADALAFIL 5 MG PO TABS: 5.0000 mg | ORAL_TABLET | Freq: Every day | ORAL | 11 refills | Status: AC

## 2024-04-24 NOTE — Progress Notes (Signed)
 04/24/2024 10:36 AM   Russell [name] 10-Mar-1946 [id number]  Referring provider: [name], MD [address] [place],  [place] [address]  Followup BPH and elevated PSA.    HPI: Mr Combes is a 78yo here for folowup for elevated PSA and BPh with urinary retention. PSA stable at 1.4. IPSS 18 QOL 2 on rapaflo  8mg  BID and finasteride  5mg  daily. Urine stream strong. No straining to urinate. He is trying to get an erection since last visit and with nitroglycerin  ointment he gets a soft erection.    PMH: Past Medical History:  Diagnosis Date   Acute blood loss anemia 09/26/2022   Anemia 2019   Arachnoiditis    Arrhythmia    Arthritis    Bleeding internal hemorrhoids 09/10/2006   Qualifier: Diagnosis of  By: Karren MD, Cornelius     Blood transfusion without reported diagnosis    Cancer (HCC) 2022   Cataract    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HCC) 2011   Cerebellar infarct (HCC)    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   anterior cer disc and fusion with plates R6-5 and C4-5 at [facility]   Chronic kidney disease    Colon cancer (HCC)    Colonic mass 09/29/2022   Constipation    Encounter for general adult medical examination with abnormal findings 06/27/2022   Enlarged prostate    GERD (gastroesophageal reflux disease)    Hemorrhoids    History of kidney stones    passed stones   History of lumbar laminectomy 05/12/2020   open L2-L5 at [facility]   Hx of colonic polyp 05/29/2018   05/2018 cecal ssp   Lung cancer (HCC)    Lung nodule 02/2021   Lyme disease    Lynch syndrome    Maxillary sinus polyp    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   Mixed hyperlipidemia 07/19/2023   Pneumonia    Prolapsed internal hemorrhoids, grade 3 09/10/2006   Qualifier: Diagnosis of  By: Karren MD, Cornelius     Salivary gland enlargement    Sleep apnea 10 years ago   declined CPAP   Small intestine cancer (HCC)    Stroke (HCC) 15-20 years ago    Surgical History: Past Surgical History:   Procedure Laterality Date   BIOPSY  09/28/2022   Procedure: BIOPSY;  Surgeon: Legrand Victory LITTIE DOUGLAS, MD;  Location: [facility];  Service: Gastroenterology;;   BRONCHIAL BIOPSY  05/16/2021   Procedure: BRONCHIAL BIOPSIES;  Surgeon: Brenna Adine LITTIE, DO;  [name];  Service: Pulmonary;;   BRONCHIAL BRUSHINGS  05/16/2021   Procedure: BRONCHIAL BRUSHINGS;  Surgeon: Brenna Adine LITTIE, DO;  [name];  Service: Pulmonary;;   BRONCHIAL NEEDLE ASPIRATION BIOPSY  05/16/2021   Procedure: BRONCHIAL NEEDLE ASPIRATION BIOPSIES;  Surgeon: Brenna Adine LITTIE, DO;  [name];  Service: Pulmonary;;   CERVICAL SPINE SURGERY  04/10/2020   C3-C4 with pin at [facility]   COLON SURGERY  11-09-2022   COLONOSCOPY     COLONOSCOPY WITH PROPOFOL  N/A 09/28/2022   Procedure: COLONOSCOPY WITH PROPOFOL ;  Surgeon: Legrand Victory LITTIE DOUGLAS, MD;  [facility];  Service: Gastroenterology;  Laterality: N/A;   ESOPHAGOGASTRODUODENOSCOPY (EGD) WITH PROPOFOL  N/A 09/28/2022   Procedure: ESOPHAGOGASTRODUODENOSCOPY (EGD) WITH PROPOFOL ;  Surgeon: Legrand Victory LITTIE DOUGLAS, MD;  [facility];  Service: Gastroenterology;  Laterality: N/A;   FIDUCIAL MARKER PLACEMENT  05/16/2021   Procedure: FIDUCIAL MARKER PLACEMENT;  Surgeon: Brenna Adine LITTIE, DO;  [name];  Service: Pulmonary;;   HEMORRHOID BANDING     LUMBAR LAMINECTOMY  03/08/2010   L2-3, cauda equina syndrome   lumbar laminectomy  05/12/2020   [facility]   SMALL INTESTINE SURGERY  11-09-2022   SPINE SURGERY     UPPER GI ENDOSCOPY     VIDEO BRONCHOSCOPY WITH ENDOBRONCHIAL NAVIGATION Left 05/16/2021   Procedure: VIDEO BRONCHOSCOPY WITH ENDOBRONCHIAL NAVIGATION;  Surgeon: Brenna Adine CROME, DO;  [name];  Service: Pulmonary;  Laterality: Left;  ION w/ fiducial placement   VIDEO BRONCHOSCOPY WITH RADIAL ENDOBRONCHIAL ULTRASOUND  05/16/2021   Procedure: VIDEO BRONCHOSCOPY WITH RADIAL ENDOBRONCHIAL ULTRASOUND;  Surgeon:  Brenna Adine CROME, DO;  [name];  Service: Pulmonary;;   wisdom teeth ext      Home Medications:  Allergies as of 04/24/2024   No Known Allergies      Medication List        Accurate as of April 24, 2024 10:36 AM. If you have any questions, ask your nurse or doctor.          aspirin  EC 81 MG tablet Take 1 tablet (81 mg total) by mouth every evening.   Citrucel oral powder Generic drug: methylcellulose Take by mouth.   finasteride  5 MG tablet Commonly known as: PROSCAR  Take 1 tablet (5 mg total) by mouth daily.   FISH OIL PO Take 460 mg by mouth 2 (two) times daily.   GINGER PO Take by mouth.   iron  polysaccharides 150 MG capsule Commonly known as: NIFEREX Take 1 capsule (150 mg total) by mouth daily.   Magnesium 400 MG Caps Take 400 mg by mouth 2 (two) times daily. asporotate   melatonin 5 MG Tabs Take 5 mg by mouth at bedtime.   metoprolol  succinate 25 MG 24 hr tablet Commonly known as: TOPROL -XL Take 1 tablet (25 mg total) by mouth daily.   multivitamin tablet Take 1 tablet by mouth daily.   omeprazole  40 MG capsule Commonly known as: PRILOSEC TAKE (1) CAPSULE BY MOUTH ONCE DAILY.   silodosin  8 MG Caps capsule Commonly known as: RAPAFLO  Take 1 capsule (8 mg total) by mouth 2 (two) times daily.   TART CHERRY PO Take by mouth.   TURMERIC PO Take 500 mg by mouth 2 (two) times daily. curamed curcumin        Allergies: No Known Allergies  Family History: Family History  Problem Relation Age of Onset   Aortic aneurysm Mother    Varicose Veins Mother    Heart attack Father    Arthritis Father    Aortic aneurysm Sister    Early death Sister    CVA Sister    Aortic aneurysm Sister    Aortic aneurysm Sister    Breast cancer Paternal Aunt    Colon cancer Neg Hx    Pancreatic cancer Neg Hx    Rectal cancer Neg Hx    Stomach cancer Neg Hx    Esophageal cancer Neg Hx    Colon polyps Neg Hx     Social History:  reports  that he quit smoking about 49 years ago. His smoking use included cigarettes. He started smoking about 58 years ago. He has a 2.3 pack-year smoking history. He has been exposed to tobacco smoke. He has never used smokeless tobacco. He reports that he does not currently use alcohol. He reports that he does not use drugs.  ROS: All other review of systems were reviewed and are negative except what is noted above in HPI  Physical Exam: BP 122/77   Pulse 82   Constitutional:  Alert and oriented, No acute distress. HEENT: [place] AT, moist mucus membranes.  Trachea midline, no masses. Cardiovascular: No clubbing, cyanosis, or edema. Respiratory: Normal respiratory effort, no increased work of breathing. GI: Abdomen is soft, nontender, nondistended, no abdominal masses GU: No CVA tenderness.  Lymph: No cervical or inguinal lymphadenopathy. Skin: No rashes, bruises or suspicious lesions. Neurologic: Grossly intact, no focal deficits, moving all 4 extremities. Psychiatric: Normal mood and affect.  Laboratory Data: Lab Results  Component Value Date   WBC 7.1 03/05/2024   HGB 13.3 03/05/2024   HCT 40.4 03/05/2024   MCV 79.2 (L) 03/05/2024   PLT 209 03/05/2024    Lab Results  Component Value Date   CREATININE 0.66 03/05/2024    Lab Results  Component Value Date   PSA 1.95 09/28/2019   PSA 1.10 02/10/2009    Lab Results  Component Value Date   TESTOSTERONE 467.03 09/12/2006    Lab Results  Component Value Date   HGBA1C 5.6 09/29/2019    Urinalysis    Component Value Date/Time   COLORURINE YELLOW 03/07/2010 2315   APPEARANCEUR Clear 04/24/2023 1445   LABSPEC 1.021 03/07/2010 2315   PHURINE 6.0 03/07/2010 2315   GLUCOSEU Negative 04/24/2023 1445   HGBUR NEGATIVE 03/07/2010 2315   BILIRUBINUR Negative 04/24/2023 1445   KETONESUR NEGATIVE 03/07/2010 2315   PROTEINUR Negative 04/24/2023 1445   PROTEINUR NEGATIVE 03/07/2010 2315   UROBILINOGEN 1.0 03/20/2018 1406    UROBILINOGEN 0.2 03/07/2010 2315   NITRITE Negative 04/24/2023 1445   NITRITE NEGATIVE 03/07/2010 2315   LEUKOCYTESUR Negative 04/24/2023 1445    Lab Results  Component Value Date   LABMICR Comment 04/24/2023   WBCUA None seen 03/08/2021   LABEPIT 0-10 03/08/2021   BACTERIA None seen 03/08/2021    Pertinent Imaging:  No results found for this or any previous visit.  No results found for this or any previous visit.  No results found for this or any previous visit.  No results found for this or any previous visit.  No results found for this or any previous visit.  No results found for this or any previous visit.  No results found for this or any previous visit.  No results found for this or any previous visit.   Assessment & Plan:    1. Elevated PSA (Primary) Followup 6 months with PSA - Urinalysis, Routine w reflex microscopic  2. Benign prostatic hyperplasia with urinary obstruction Continue rapalfo 8mg  BIS  3. Urinary retention Continue rapalfo 8mg  BID and finasteride  5mg  daily  4. Erectile dysfunction -we will trial tadalafil 5mg  daily    No follow-ups on file.  Belvie Clara, MD  [facility]

## 2024-05-05 ENCOUNTER — Encounter: Payer: Self-pay | Admitting: Urology

## 2024-05-05 NOTE — Patient Instructions (Signed)

## 2024-05-14 ENCOUNTER — Telehealth: Payer: Self-pay | Admitting: Internal Medicine

## 2024-05-14 MED ORDER — OMEPRAZOLE 40 MG PO CPDR: DELAYED_RELEASE_CAPSULE | ORAL | 3 refills | Status: AC

## 2024-05-14 NOTE — Telephone Encounter (Signed)
 Inbound call from patient requesting a refill on his medication called Omeprazole  40 MG. Patient is requesting that the medication be sent to [facility] in [place] located on [address], [place], [place] [address]. Good contact number for them is  [phone]. Please advise.

## 2024-05-14 NOTE — Telephone Encounter (Signed)
 Omeprazole  refilled, patient up to date on his office visits. I left him a detailed voice mail message that this has been done.

## 2024-05-14 NOTE — Telephone Encounter (Signed)
 Patient returning call. Advised on previous note. No further action needed.

## 2024-05-19 NOTE — Telephone Encounter (Signed)
 Pt called in today and state's that the nitroglycerin  0.4% oint is not helping him with his erectile dysfunction. Pt want a RX for a penis pump or if Dr. Sherrilee have other treatment option that could possible help with his erectile dysfunction, pt state's he is interested in getting information on other treatment options. Pt voiced understanding.

## 2024-06-01 DIAGNOSIS — D485 Neoplasm of uncertain behavior of skin: Secondary | ICD-10-CM | POA: Diagnosis not present

## 2024-06-09 DIAGNOSIS — H43811 Vitreous degeneration, right eye: Secondary | ICD-10-CM | POA: Diagnosis not present

## 2024-06-15 NOTE — Progress Notes (Deleted)
 Office Visit Note  Patient: Russell [name]             Date of Birth: 03/01/1946           MRN: [id number]             PCP: [name], MD Referring: [name], MD Visit Date: 06/26/2024 Occupation: Data Unavailable  Subjective:  No chief complaint on file.   History of Present Illness: Russell [name] is a 78 y.o. male ***     Activities of Daily Living:  Patient reports morning stiffness for *** {minute/hour:19697}.   Patient {ACTIONS;DENIES/REPORTS:21021675::Denies} nocturnal pain.  Difficulty dressing/grooming: {ACTIONS;DENIES/REPORTS:21021675::Denies} Difficulty climbing stairs: {ACTIONS;DENIES/REPORTS:21021675::Denies} Difficulty getting out of chair: {ACTIONS;DENIES/REPORTS:21021675::Denies} Difficulty using hands for taps, buttons, cutlery, and/or writing: {ACTIONS;DENIES/REPORTS:21021675::Denies}  No Rheumatology ROS completed.   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   Palpitations 07/19/2023   Gait disturbance 06/03/2023   H/O right hemicolectomy 01/14/2023   Erythema migrans (Lyme disease) 12/31/2022   Neurogenic bowel 11/30/2022   Cancer of right colon (HCC) 11/08/2022   IDA (iron  deficiency anemia) 09/26/2022   History of cancer of lower lobe bronchus or lung - s/p left lower lobectomy at [facility] 09-07-2021 09/26/2022   Superficial vein thrombosis 09/24/2022   Neuropathic pain 08/31/2022   Neurogenic bladder 08/31/2022   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)  05/23/2021   Former smoker 05/05/2021   Elevated PSA 02/16/2021   S/P lumbar laminectomy 05/13/2020   Erectile dysfunction due to arterial insufficiency 05/11/2020   S/P cervical spinal fusion 05/10/2020   Benign prostatic hyperplasia with urinary obstruction 04/11/2020   DDD (degenerative disc disease), cervical 06/11/2018   DDD (degenerative disc disease), lumbar 06/11/2018   History of kidney stones 06/11/2018   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HCC) 06/11/2018   Primary osteoarthritis of left knee 06/11/2018   Primary osteoarthritis of both feet 06/11/2018   Plantar fasciitis 06/11/2018   Hx of colonic polyp 05/29/2018   PAD (peripheral artery disease) 04/02/2018   Left lumbar radiculopathy 01/07/2018   Diastolic dysfunction 10/15/2016   Psoriasis 10/15/2016   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   Cervical spondylosis with radiculopathy 01/19/2014   GERD 10/02/2007   Prolapsed internal hemorrhoids, grade 2 and 3 09/10/2006   SLEEP APNEA 09/10/2006    Past Medical History:  Diagnosis Date   Acute blood loss anemia 09/26/2022   Anemia 2019   Arachnoiditis    Arrhythmia    Arthritis    Bleeding internal hemorrhoids 09/10/2006   Qualifier: Diagnosis of  By: Karren MD, Cornelius     Blood transfusion without reported diagnosis    Cancer (HCC) 2022   Cataract    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HCC) 2011   Cerebellar infarct [facility])    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   anterior cer disc and fusion with plates R6-5 and C4-5 at [facility]   Chronic kidney disease    Colon cancer (HCC)    Colonic mass 09/29/2022   Constipation    Encounter for general adult medical examination with abnormal findings 06/27/2022  Enlarged prostate    GERD (gastroesophageal reflux disease)    Hemorrhoids    History of kidney stones    passed stones   History of lumbar laminectomy 05/12/2020   open L2-L5 at [facility]   Hx of colonic polyp 05/29/2018   05/2018 cecal ssp   Lung cancer (HCC)    Lung nodule 02/2021    Lyme disease    Lynch syndrome    Maxillary sinus polyp    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   Mixed hyperlipidemia 07/19/2023   Pneumonia    Prolapsed internal hemorrhoids, grade 3 09/10/2006   Qualifier: Diagnosis of  By: Karren MD, Cornelius     Salivary gland enlargement    Sleep apnea 10 years ago   declined CPAP   Small intestine cancer (HCC)    Stroke (HCC) 15-20 years ago    Family History  Problem Relation Age of Onset   Aortic aneurysm Mother    Varicose Veins Mother    Heart attack Father    Arthritis Father    Aortic aneurysm Sister    Early death Sister    CVA Sister    Aortic aneurysm Sister    Aortic aneurysm Sister    Breast cancer Paternal Aunt    Colon cancer Neg Hx    Pancreatic cancer Neg Hx    Rectal cancer Neg Hx    Stomach cancer Neg Hx    Esophageal cancer Neg Hx    Colon polyps Neg Hx    Past Surgical History:  Procedure Laterality Date   BIOPSY  09/28/2022   Procedure: BIOPSY;  Surgeon: Legrand Victory LITTIE DOUGLAS, MD;  [facility];  Service: Gastroenterology;;   BRONCHIAL BIOPSY  05/16/2021   Procedure: BRONCHIAL BIOPSIES;  Surgeon: Brenna Adine LITTIE, DO;  [name];  Service: Pulmonary;;   BRONCHIAL BRUSHINGS  05/16/2021   Procedure: BRONCHIAL BRUSHINGS;  Surgeon: Brenna Adine LITTIE, DO;  [name];  Service: Pulmonary;;   BRONCHIAL NEEDLE ASPIRATION BIOPSY  05/16/2021   Procedure: BRONCHIAL NEEDLE ASPIRATION BIOPSIES;  Surgeon: Brenna Adine LITTIE, DO;  [name];  Service: Pulmonary;;   CERVICAL SPINE SURGERY  04/10/2020   C3-C4 with pin at [facility]   COLON SURGERY  11-09-2022   COLONOSCOPY     COLONOSCOPY WITH PROPOFOL  N/A 09/28/2022   Procedure: COLONOSCOPY WITH PROPOFOL ;  Surgeon: Legrand Victory LITTIE DOUGLAS, MD;  [facility];  Service: Gastroenterology;  Laterality: N/A;   ESOPHAGOGASTRODUODENOSCOPY (EGD) WITH PROPOFOL  N/A 09/28/2022   Procedure: ESOPHAGOGASTRODUODENOSCOPY (EGD) WITH PROPOFOL ;  Surgeon:  Legrand Victory LITTIE DOUGLAS, MD;  [facility];  Service: Gastroenterology;  Laterality: N/A;   FIDUCIAL MARKER PLACEMENT  05/16/2021   Procedure: FIDUCIAL MARKER PLACEMENT;  Surgeon: Brenna Adine LITTIE, DO;  [name];  Service: Pulmonary;;   HEMORRHOID BANDING     LUMBAR LAMINECTOMY  03/08/2010   L2-3, cauda equina syndrome   lumbar laminectomy  05/12/2020   [facility]   SMALL INTESTINE SURGERY  11-09-2022   SPINE SURGERY     UPPER GI ENDOSCOPY     VIDEO BRONCHOSCOPY WITH ENDOBRONCHIAL NAVIGATION Left 05/16/2021   Procedure: VIDEO BRONCHOSCOPY WITH ENDOBRONCHIAL NAVIGATION;  Surgeon: Brenna Adine LITTIE, DO;  [name];  Service: Pulmonary;  Laterality: Left;  ION w/ fiducial placement   VIDEO BRONCHOSCOPY WITH RADIAL ENDOBRONCHIAL ULTRASOUND  05/16/2021   Procedure: VIDEO BRONCHOSCOPY WITH RADIAL ENDOBRONCHIAL ULTRASOUND;  Surgeon: Brenna Adine LITTIE, DO;  [name];  Service: Pulmonary;;  wisdom teeth ext     Social History   Tobacco Use   Smoking status: Former    Current packs/day: 0.00    Average packs/day: 0.3 packs/day for 9.0 years (2.3 ttl pk-yrs)    Types: Cigarettes    Start date: 10/15/1965    Quit date: 10/16/1974    Years since quitting: 49.6    Passive exposure: Past   Smokeless tobacco: Never  Vaping Use   Vaping status: Never Used  Substance Use Topics   Alcohol use: Not Currently   Drug use: No   Social History   Social History Narrative   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     Immunization History  Administered Date(s) Administered    sv, Bivalent, Protein Subunit Rsvpref,pf (Abrysvo) 06/13/2022   Fluad Quad(high Dose 65+) 04/11/2022, 05/03/2023   INFLUENZA, HIGH DOSE SEASONAL PF 03/31/2019, 04/08/2020, 05/03/2023, 04/15/2024   Influenza Split  05/18/2014   Influenza Whole 04/28/2008   Influenza,inj,Quad PF,6+ Mos 05/01/2019   Influenza,inj,quad, With Preservative 05/13/2018   Influenza-Unspecified 05/13/2018, 04/29/2021, 05/05/2022   Moderna Covid-19 Fall Seasonal Vaccine 73yrs & older 06/13/2022   Moderna SARS-COV2 Booster Vaccination 06/01/2020   Moderna Sars-Covid-2 Vaccination 08/21/2019, 09/19/2019, 06/01/2020   Pneumococcal Conjugate-13 10/09/2013   Pneumococcal-Unspecified 10/09/2013   Td 04/28/2008   Tdap 04/28/2008, 06/22/2021   Zoster Recombinant(Shingrix) 12/21/2014, 01/15/2022   Zoster, Live 12/21/2014   Zoster, Unspecified 12/21/2014     Objective: Vital Signs: There were no vitals taken for this visit.   Physical Exam   Musculoskeletal Exam: ***  CDAI Exam: CDAI Score: -- Patient Global: --; Provider Global: -- Swollen: --; Tender: -- Joint Exam 06/26/2024   No joint exam has been documented for this visit   There is currently no information documented on the homunculus. Go to the Rheumatology activity and complete the homunculus joint exam.  Investigation: No additional findings.  Imaging: No results found.  Recent Labs: Lab Results  Component Value Date   WBC 7.1 03/05/2024   HGB 13.3 03/05/2024   PLT 209 03/05/2024   NA 140 03/05/2024   K 3.6 03/05/2024   CL 104 03/05/2024   CO2 27 03/05/2024   GLUCOSE 105 (H) 03/05/2024   BUN 18 03/05/2024   CREATININE 0.66 03/05/2024   BILITOT 1.0 03/05/2024   ALKPHOS 47 03/05/2024   AST 22 03/05/2024   ALT 18 03/05/2024   PROT 7.2 03/05/2024   ALBUMIN  3.9 03/05/2024   CALCIUM 9.4 03/05/2024   GFRAA >60 06/13/2018    Speciality Comments: No specialty comments available.  Procedures:  No procedures performed Allergies: Patient has no known allergies.   Assessment / Plan:     Visit Diagnoses: No diagnosis found.  Orders: No orders of the defined types were placed in this encounter.  No orders of the defined types were placed in  this encounter.   Face-to-face time spent with patient was *** minutes. Greater than 50% of time was spent in counseling and coordination of care.  Follow-Up Instructions: No follow-ups on file.   Russell [name], CMA  Note - This record has been created using Animal nutritionist.  Chart creation errors have been sought, but may not always  have been located. Such creation errors do not reflect on  the standard of medical care.

## 2024-06-23 ENCOUNTER — Ambulatory Visit: Admitting: Physician Assistant

## 2024-06-26 ENCOUNTER — Ambulatory Visit: Payer: Medicare PPO | Admitting: Rheumatology

## 2024-06-26 DIAGNOSIS — Z87442 Personal history of urinary calculi: Secondary | ICD-10-CM

## 2024-06-26 DIAGNOSIS — K9041 Non-celiac gluten sensitivity: Secondary | ICD-10-CM

## 2024-06-26 DIAGNOSIS — I10 Essential (primary) hypertension: Secondary | ICD-10-CM

## 2024-06-26 DIAGNOSIS — Z860100 Personal history of colon polyps, unspecified: Secondary | ICD-10-CM

## 2024-06-26 DIAGNOSIS — C3432 Malignant neoplasm of lower lobe, left bronchus or lung: Secondary | ICD-10-CM

## 2024-06-26 DIAGNOSIS — L719 Rosacea, unspecified: Secondary | ICD-10-CM

## 2024-06-26 DIAGNOSIS — Z87891 Personal history of nicotine dependence: Secondary | ICD-10-CM

## 2024-06-26 DIAGNOSIS — R7689 Other specified abnormal immunological findings in serum: Secondary | ICD-10-CM

## 2024-06-26 DIAGNOSIS — M5030 Other cervical disc degeneration, unspecified cervical region: Secondary | ICD-10-CM

## 2024-06-26 DIAGNOSIS — I5189 Other ill-defined heart diseases: Secondary | ICD-10-CM

## 2024-06-26 DIAGNOSIS — M51369 Other intervertebral disc degeneration, lumbar region without mention of lumbar back pain or lower extremity pain: Secondary | ICD-10-CM

## 2024-06-26 DIAGNOSIS — M19041 Primary osteoarthritis, right hand: Secondary | ICD-10-CM

## 2024-06-26 DIAGNOSIS — F439 Reaction to severe stress, unspecified: Secondary | ICD-10-CM

## 2024-06-26 DIAGNOSIS — M19071 Primary osteoarthritis, right ankle and foot: Secondary | ICD-10-CM

## 2024-06-26 DIAGNOSIS — M1712 Unilateral primary osteoarthritis, left knee: Secondary | ICD-10-CM

## 2024-06-26 DIAGNOSIS — I639 Cerebral infarction, unspecified: Secondary | ICD-10-CM

## 2024-06-26 DIAGNOSIS — Z85038 Personal history of other malignant neoplasm of large intestine: Secondary | ICD-10-CM

## 2024-07-24 ENCOUNTER — Ambulatory Visit: Admitting: Internal Medicine

## 2024-07-24 ENCOUNTER — Encounter: Payer: Self-pay | Admitting: Internal Medicine

## 2024-07-24 VITALS — BP 125/79 | HR 71 | Ht 72.0 in | Wt 195.0 lb

## 2024-07-24 DIAGNOSIS — Z85038 Personal history of other malignant neoplasm of large intestine: Secondary | ICD-10-CM | POA: Diagnosis not present

## 2024-07-24 DIAGNOSIS — I471 Supraventricular tachycardia, unspecified: Secondary | ICD-10-CM

## 2024-07-24 DIAGNOSIS — C182 Malignant neoplasm of ascending colon: Secondary | ICD-10-CM

## 2024-07-24 DIAGNOSIS — Z0001 Encounter for general adult medical examination with abnormal findings: Secondary | ICD-10-CM | POA: Diagnosis not present

## 2024-07-24 DIAGNOSIS — M1712 Unilateral primary osteoarthritis, left knee: Secondary | ICD-10-CM

## 2024-07-24 DIAGNOSIS — C3432 Malignant neoplasm of lower lobe, left bronchus or lung: Secondary | ICD-10-CM

## 2024-07-24 DIAGNOSIS — D500 Iron deficiency anemia secondary to blood loss (chronic): Secondary | ICD-10-CM

## 2024-07-24 DIAGNOSIS — N401 Enlarged prostate with lower urinary tract symptoms: Secondary | ICD-10-CM | POA: Diagnosis not present

## 2024-07-24 DIAGNOSIS — N138 Other obstructive and reflux uropathy: Secondary | ICD-10-CM | POA: Diagnosis not present

## 2024-07-24 DIAGNOSIS — E782 Mixed hyperlipidemia: Secondary | ICD-10-CM

## 2024-07-24 DIAGNOSIS — Z85118 Personal history of other malignant neoplasm of bronchus and lung: Secondary | ICD-10-CM | POA: Diagnosis not present

## 2024-07-24 DIAGNOSIS — R739 Hyperglycemia, unspecified: Secondary | ICD-10-CM | POA: Diagnosis not present

## 2024-07-24 DIAGNOSIS — E559 Vitamin D deficiency, unspecified: Secondary | ICD-10-CM

## 2024-07-24 DIAGNOSIS — R269 Unspecified abnormalities of gait and mobility: Secondary | ICD-10-CM | POA: Diagnosis not present

## 2024-07-24 NOTE — Patient Instructions (Signed)
 Please contact [name]. [phone]  Please apply Voltaren  for knee pain.  Please continue to take other medications as prescribed.  Please continue to follow heart healthy diet and perform moderate exercise/walking as tolerated.

## 2024-07-24 NOTE — Progress Notes (Unsigned)
 "  Established Patient Office Visit  Subjective:  Patient ID: Russell [name], male    DOB: 1946-04-13  Age: 79 y.o. MRN: [id number]  CC:  Chief Complaint  Patient presents with   Annual Exam    Cpe    Knee Pain    Having sx with his left knee concerned it could be giving out. Has been limiting his walking due to this, needs a referral.     HPI Russell [name] is a 79 y.o. male with past medical history of DDD of cervical and lumbar spine, cauda equina syndrome, GERD, lung cancer and BPH who presents for annual physical.  He reports episodes of lightheadedness, worse with position changes.  Of note, he takes silodosin  8 mg twice daily for BPH currently and agrees to cut down dose to QD.  He has history of DDD of cervical and lumbar spine.  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  He has seen Dr. Claudene at [facility] spine and pain management clinic. He is seeing Dr Cornelio currently. He has worsening of his symptoms with bending, especially while tying his shoelaces.  His symptoms are worse in the evening.  He has been treated with steroids, gabapentin  and muscle relaxers in the past. He follows up with PM&R now and has been trying to taper Gabapentin  as it has not been helping with him.   He had right hemicolectomy for colon cancer in 04/24.  He had C. Diff colitis in 05/24, which was treated with oral vancomycin .  He reports resolution of diarrhea now.  Denies any fever or chills.  His Hb had dropped to 6.8 and last CBC in 07/24 showed Hb of 9.6.  He denies any melena or hematochezia currently.  He sees Dr. Kara for history of left lower lobe lung CA and has had resection of left lower lobe with Dr. Elmo at [facility].  He denies any chest pain, dyspnea or wheezing currently.    Past Medical History:  Diagnosis Date   Acute blood loss anemia 09/26/2022   Anemia 2019   Arachnoiditis    Arrhythmia    Arthritis    Bleeding  internal hemorrhoids 09/10/2006   Qualifier: Diagnosis of  By: Karren MD, Cornelius     Blood transfusion without reported diagnosis    Cancer [facility]) 2022   Cataract    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HCC) 2011   Cerebellar infarct (HCC)    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   anterior cer disc and fusion with plates R6-5 and C4-5 at [facility]   Chronic kidney disease    Colon cancer (HCC)    Colonic mass 09/29/2022   Constipation    Encounter for general adult medical examination with abnormal findings 06/27/2022   Enlarged prostate    GERD (gastroesophageal reflux disease)    Hemorrhoids    History of kidney stones    passed stones   History of lumbar laminectomy 05/12/2020   open L2-L5 at [facility]   Hx of colonic polyp 05/29/2018   05/2018 cecal ssp   Lung cancer (HCC)    Lung nodule 02/2021   Lyme disease    Lynch syndrome    Maxillary sinus polyp    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   Mixed hyperlipidemia 07/19/2023   Pneumonia    Prolapsed internal hemorrhoids, grade 3 09/10/2006   Qualifier: Diagnosis of  By: Karren MD, Cornelius     Salivary gland  enlargement    Sleep apnea 10 years ago   declined CPAP   Small intestine cancer (HCC)    Stroke (HCC) 15-20 years ago    Past Surgical History:  Procedure Laterality Date   BIOPSY  09/28/2022   Procedure: BIOPSY;  Surgeon: Legrand Victory LITTIE DOUGLAS, MD;  Location: [facility];  Service: Gastroenterology;;   BRONCHIAL BIOPSY  05/16/2021   Procedure: BRONCHIAL BIOPSIES;  Surgeon: Brenna Adine LITTIE, DO;  Location: [name];  Service: Pulmonary;;   BRONCHIAL BRUSHINGS  05/16/2021   Procedure: BRONCHIAL BRUSHINGS;  Surgeon: Brenna Adine LITTIE, DO;  Location: [name];  Service: Pulmonary;;   BRONCHIAL NEEDLE ASPIRATION BIOPSY  05/16/2021   Procedure: BRONCHIAL NEEDLE ASPIRATION BIOPSIES;  Surgeon: Brenna Adine LITTIE, DO;  Location: [name];  Service: Pulmonary;;   CERVICAL SPINE SURGERY  04/10/2020   C3-C4 with pin at [facility]   COLON  SURGERY  11-09-2022   COLONOSCOPY     COLONOSCOPY WITH PROPOFOL  N/A 09/28/2022   Procedure: COLONOSCOPY WITH PROPOFOL ;  Surgeon: Legrand Victory LITTIE DOUGLAS, MD;  Location: [facility];  Service: Gastroenterology;  Laterality: N/A;   ESOPHAGOGASTRODUODENOSCOPY (EGD) WITH PROPOFOL  N/A 09/28/2022   Procedure: ESOPHAGOGASTRODUODENOSCOPY (EGD) WITH PROPOFOL ;  Surgeon: Legrand Victory LITTIE DOUGLAS, MD;  Location: [facility];  Service: Gastroenterology;  Laterality: N/A;   FIDUCIAL MARKER PLACEMENT  05/16/2021   Procedure: FIDUCIAL MARKER PLACEMENT;  Surgeon: Brenna Adine LITTIE, DO;  Location: [name];  Service: Pulmonary;;   HEMORRHOID BANDING     LUMBAR LAMINECTOMY  03/08/2010   L2-3, cauda equina syndrome   lumbar laminectomy  05/12/2020   [facility]   SMALL INTESTINE SURGERY  11-09-2022   SPINE SURGERY     UPPER GI ENDOSCOPY     VIDEO BRONCHOSCOPY WITH ENDOBRONCHIAL NAVIGATION Left 05/16/2021   Procedure: VIDEO BRONCHOSCOPY WITH ENDOBRONCHIAL NAVIGATION;  Surgeon: Brenna Adine LITTIE, DO;  Location: [name];  Service: Pulmonary;  Laterality: Left;  ION w/ fiducial placement   VIDEO BRONCHOSCOPY WITH RADIAL ENDOBRONCHIAL ULTRASOUND  05/16/2021   Procedure: VIDEO BRONCHOSCOPY WITH RADIAL ENDOBRONCHIAL ULTRASOUND;  Surgeon: Brenna Adine LITTIE, DO;  Location: [name];  Service: Pulmonary;;   wisdom teeth ext      Family History  Problem Relation Age of Onset   Aortic aneurysm Mother    Varicose Veins Mother    Heart attack Father    Arthritis Father    Aortic aneurysm Sister    Early death Sister    CVA Sister    Aortic aneurysm Sister    Aortic aneurysm Sister    Breast cancer Paternal Aunt    Colon cancer Neg Hx    Pancreatic cancer Neg Hx    Rectal cancer Neg Hx    Stomach cancer Neg Hx    Esophageal cancer Neg Hx    Colon polyps Neg Hx     Social History   Socioeconomic History   Marital status: Widowed    Spouse name: Not on file   Number of children: 0   Years of education: Not on  file   Highest education level: Master's degree (e.g., MA, MS, MEng, MEd, MSW, MBA)  Occupational History   Occupation: retired  Tobacco Use   Smoking status: Former    Current packs/day: 0.00    Average packs/day: 0.3 packs/day for 9.0 years (2.3 ttl pk-yrs)    Types: Cigarettes    Start date: 10/15/1965    Quit date: 10/16/1974    Years since quitting: 49.8    Passive exposure:  Past   Smokeless tobacco: Never  Vaping Use   Vaping status: Never Used  Substance and Sexual Activity   Alcohol use: Not Currently   Drug use: No   Sexual activity: Not Currently  Other Topics Concern   Not on file  Social History Narrative   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   Social Drivers of Health   Tobacco Use: Medium Risk (07/24/2024)   Patient History    Smoking Tobacco Use: Former    Smokeless Tobacco Use: Never    Passive Exposure: Past  Physicist, Medical Strain: Low Risk (07/22/2024)   Overall Financial Resource Strain (CARDIA)    Difficulty of Paying Living Expenses: Not hard at all  Food Insecurity: No Food Insecurity (07/22/2024)   Epic    Worried About Programme Researcher, Broadcasting/film/video in the Last Year: Never true    Ran Out of Food in the Last Year: Never true  Transportation Needs: No Transportation Needs (07/22/2024)   Epic    Lack of Transportation (Medical): No    Lack of Transportation (Non-Medical): No  Physical Activity: Sufficiently Active (07/22/2024)   Exercise Vital Sign    Days of Exercise per Week: 6 days    Minutes of Exercise per Session: 40 min  Stress: No Stress Concern Present (07/22/2024)   [employer] of Occupational Health - Occupational Stress Questionnaire    Feeling of Stress: Not at all  Social Connections: Moderately Isolated (07/22/2024)   Social Connection and Isolation Panel     Frequency of Communication with Friends and Family: More than three times a week    Frequency of Social Gatherings with Friends and Family: Once a week    Attends Religious Services: More than 4 times per year    Active Member of [employer] or Organizations: No    Attends Banker Meetings: Not on file    Marital Status: Widowed  Intimate Partner Violence: Not At Risk (04/08/2024)   Epic    Fear of Current or Ex-Partner: No    Emotionally Abused: No    Physically Abused: No    Sexually Abused: No  Depression (PHQ2-9): Low Risk (07/24/2024)   Depression (PHQ2-9)    PHQ-2 Score: 0  Alcohol Screen: Low Risk (04/08/2024)   Alcohol Screen    Last Alcohol Screening Score (AUDIT): 0  Housing: Unknown (07/22/2024)   Epic    Unable to Pay for Housing in the Last Year: No    Number of Times Moved in the Last Year: Not on file    Homeless in the Last Year: No  Utilities: Not At Risk (04/08/2024)   Epic    Threatened with loss of utilities: No  Health Literacy: Adequate Health Literacy (04/08/2024)   B1300 Health Literacy    Frequency of need for help with medical instructions: Never    Outpatient Medications Prior to Visit  Medication Sig Dispense Refill   aspirin  EC 81 MG tablet Take 1 tablet (81 mg total) by mouth every evening. 30 tablet 12   finasteride  (PROSCAR ) 5 MG tablet Take 1 tablet (5 mg total) by mouth daily. 90 tablet 3   Ginger, Zingiber officinalis, (GINGER PO) Take by mouth.     iron  polysaccharides (NIFEREX) 150 MG capsule Take 1  capsule (150 mg total) by mouth daily. 30 capsule 1   Magnesium 400 MG CAPS Take 400 mg by mouth 2 (two) times daily. asporotate     melatonin 5 MG TABS Take 5 mg by mouth at bedtime.     methylcellulose (CITRUCEL) oral powder Take by mouth.     metoprolol  succinate (TOPROL -XL) 25 MG 24 hr tablet Take 1 tablet (25 mg total) by mouth daily. 90 tablet 3   Multiple Vitamin (MULTIVITAMIN) tablet Take 1 tablet by mouth daily.     Nitroglycerin  0.4 %  OINT Apply 1 g topically as needed. 30 g 5   Omega-3 Fatty Acids (FISH OIL PO) Take 460 mg by mouth 2 (two) times daily.     omeprazole  (PRILOSEC) 40 MG capsule TAKE (1) CAPSULE BY MOUTH ONCE DAILY. 90 capsule 3   silodosin  (RAPAFLO ) 8 MG CAPS capsule Take 1 capsule (8 mg total) by mouth 2 (two) times daily. 60 capsule 11   tadalafil  (CIALIS ) 5 MG tablet Take 1 tablet (5 mg total) by mouth daily. 30 tablet 11   TART CHERRY PO Take by mouth.     TURMERIC PO Take 500 mg by mouth 2 (two) times daily. curamed curcumin     No facility-administered medications prior to visit.    No Known Allergies  ROS Review of Systems  Constitutional:  Negative for chills and fever.  HENT:  Negative for congestion and sore throat.   Eyes:  Negative for pain and discharge.  Respiratory:  Negative for cough and shortness of breath.   Cardiovascular:  Negative for chest pain and palpitations.  Gastrointestinal:  Negative for diarrhea, nausea and vomiting.  Endocrine: Negative for polydipsia and polyuria.  Genitourinary:  Negative for dysuria and hematuria.  Musculoskeletal:  Positive for back pain and neck pain. Negative for neck stiffness.  Skin:  Negative for rash.  Neurological:  Positive for weakness, light-headedness and numbness. Negative for headaches.  Psychiatric/Behavioral:  Negative for agitation and behavioral problems.       Objective:    Physical Exam Vitals reviewed.  Constitutional:      General: He is not in acute distress.    Appearance: He is not diaphoretic.     Comments: Has a cane  HENT:     Head: Normocephalic and atraumatic.     Nose: Nose normal.     Mouth/Throat:     Mouth: Mucous membranes are moist.  Eyes:     General: No scleral icterus.    Extraocular Movements: Extraocular movements intact.  Cardiovascular:     Rate and Rhythm: Normal rate and regular rhythm.     Pulses: Normal pulses.     Heart sounds: Normal heart sounds. No murmur heard. Pulmonary:      Breath sounds: Normal breath sounds. No wheezing or rales.  Abdominal:     Palpations: Abdomen is soft.     Tenderness: There is no abdominal tenderness.  Musculoskeletal:        General: Tenderness present.     Cervical back: Neck supple. No tenderness.     Right lower leg: No edema.     Left lower leg: No edema.  Skin:    General: Skin is warm.     Findings: No rash.  Neurological:     General: No focal deficit present.     Mental Status: He is alert and oriented to person, place, and time.     Sensory: Sensory deficit (LLE) present.     Motor: Weakness (  4/5 in LLE) present.  Psychiatric:        Mood and Affect: Mood normal.        Behavior: Behavior normal.     BP 125/79   Pulse 71   Ht 6' (1.829 m)   Wt 195 lb (88.5 kg)   SpO2 93%   BMI 26.45 kg/m  Wt Readings from Last 3 Encounters:  07/24/24 195 lb (88.5 kg)  04/08/24 180 lb (81.6 kg)  02/27/24 186 lb (84.4 kg)    Lab Results  Component Value Date   TSH 1.140 07/19/2023   Lab Results  Component Value Date   WBC 7.1 03/05/2024   HGB 13.3 03/05/2024   HCT 40.4 03/05/2024   MCV 79.2 (L) 03/05/2024   PLT 209 03/05/2024   Lab Results  Component Value Date   NA 140 03/05/2024   K 3.6 03/05/2024   CO2 27 03/05/2024   GLUCOSE 105 (H) 03/05/2024   BUN 18 03/05/2024   CREATININE 0.66 03/05/2024   BILITOT 1.0 03/05/2024   ALKPHOS 47 03/05/2024   AST 22 03/05/2024   ALT 18 03/05/2024   PROT 7.2 03/05/2024   ALBUMIN  3.9 03/05/2024   CALCIUM 9.4 03/05/2024   ANIONGAP 9 03/05/2024   EGFR 93 07/19/2023   GFR 112.81 09/28/2019   Lab Results  Component Value Date   CHOL 165 07/19/2023   Lab Results  Component Value Date   HDL 63 07/19/2023   Lab Results  Component Value Date   LDLCALC 84 07/19/2023   Lab Results  Component Value Date   TRIG 98 07/19/2023   Lab Results  Component Value Date   CHOLHDL 2.6 07/19/2023   Lab Results  Component Value Date   HGBA1C 5.6 09/29/2019       Assessment & Plan:   Problem List Items Addressed This Visit   None    No orders of the defined types were placed in this encounter.   Follow-up: No follow-ups on file.    Suzzane MARLA Blanch, MD "

## 2024-07-25 ENCOUNTER — Ambulatory Visit: Payer: Self-pay | Admitting: Internal Medicine

## 2024-07-25 DIAGNOSIS — I471 Supraventricular tachycardia, unspecified: Secondary | ICD-10-CM | POA: Insufficient documentation

## 2024-07-25 LAB — CBC WITH DIFFERENTIAL/PLATELET
Basophils Absolute: 0 x10E3/uL (ref 0.0–0.2)
Basos: 1 %
EOS (ABSOLUTE): 0.1 x10E3/uL (ref 0.0–0.4)
Eos: 2 %
Hematocrit: 44.1 % (ref 37.5–51.0)
Hemoglobin: 13.9 g/dL (ref 13.0–17.7)
Immature Grans (Abs): 0 x10E3/uL (ref 0.0–0.1)
Immature Granulocytes: 0 %
Lymphocytes Absolute: 1.2 x10E3/uL (ref 0.7–3.1)
Lymphs: 20 %
MCH: 25.9 pg — ABNORMAL LOW (ref 26.6–33.0)
MCHC: 31.5 g/dL (ref 31.5–35.7)
MCV: 82 fL (ref 79–97)
Monocytes Absolute: 0.6 x10E3/uL (ref 0.1–0.9)
Monocytes: 10 %
Neutrophils Absolute: 4.1 x10E3/uL (ref 1.4–7.0)
Neutrophils: 67 %
Platelets: 227 x10E3/uL (ref 150–450)
RBC: 5.37 x10E6/uL (ref 4.14–5.80)
RDW: 13.9 % (ref 11.6–15.4)
WBC: 6.1 x10E3/uL (ref 3.4–10.8)

## 2024-07-25 LAB — CMP14+EGFR
ALT: 16 IU/L (ref 0–44)
AST: 22 IU/L (ref 0–40)
Albumin: 4.5 g/dL (ref 3.8–4.8)
Alkaline Phosphatase: 54 IU/L (ref 47–123)
BUN/Creatinine Ratio: 16 (ref 10–24)
BUN: 12 mg/dL (ref 8–27)
Bilirubin Total: 0.6 mg/dL (ref 0.0–1.2)
CO2: 24 mmol/L (ref 20–29)
Calcium: 9.7 mg/dL (ref 8.6–10.2)
Chloride: 102 mmol/L (ref 96–106)
Creatinine, Ser: 0.74 mg/dL — ABNORMAL LOW (ref 0.76–1.27)
Globulin, Total: 2.5 g/dL (ref 1.5–4.5)
Glucose: 106 mg/dL — ABNORMAL HIGH (ref 70–99)
Potassium: 4.6 mmol/L (ref 3.5–5.2)
Sodium: 140 mmol/L (ref 134–144)
Total Protein: 7 g/dL (ref 6.0–8.5)
eGFR: 94 mL/min/1.73

## 2024-07-25 LAB — TSH: TSH: 1.25 u[IU]/mL (ref 0.450–4.500)

## 2024-07-25 LAB — LIPID PANEL
Chol/HDL Ratio: 2.4 ratio (ref 0.0–5.0)
Cholesterol, Total: 155 mg/dL (ref 100–199)
HDL: 65 mg/dL
LDL Chol Calc (NIH): 71 mg/dL (ref 0–99)
Triglycerides: 104 mg/dL (ref 0–149)
VLDL Cholesterol Cal: 19 mg/dL (ref 5–40)

## 2024-07-25 LAB — HEMOGLOBIN A1C
Est. average glucose Bld gHb Est-mCnc: 114 mg/dL
Hgb A1c MFr Bld: 5.6 % (ref 4.8–5.6)

## 2024-07-25 LAB — VITAMIN D 25 HYDROXY (VIT D DEFICIENCY, FRACTURES): Vit D, 25-Hydroxy: 62.3 ng/mL (ref 30.0–100.0)

## 2024-07-25 NOTE — Assessment & Plan Note (Signed)
 On metoprolol  25 mg QD Followed by cardiology

## 2024-07-25 NOTE — Assessment & Plan Note (Signed)
 Likely due to GI bleeding in the past S/p right hemicolectomy for colon cancer On iron  supplement Check CBC

## 2024-07-25 NOTE — Assessment & Plan Note (Signed)
 Former smoker Followed by Pulmonology and Oncology - needs follow-up, contact information provided S/p lobectomy at [facility]

## 2024-07-25 NOTE — Assessment & Plan Note (Signed)
Takes Rapaflo and finasteride °Followed by Dr. McKenzie °

## 2024-07-25 NOTE — Assessment & Plan Note (Signed)
 Chronic left knee pain and swelling, recently worse Advised to use Voltaren  gel as needed for pain Referred to orthopedic surgery

## 2024-07-25 NOTE — Assessment & Plan Note (Addendum)
 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

## 2024-07-25 NOTE — Assessment & Plan Note (Signed)
 Status post right hemicolectomy Last colonoscopy in 07/25, followed by GI Needs follow-up with oncology

## 2024-07-25 NOTE — Assessment & Plan Note (Signed)
 Physical exam as documented. Fasting blood tests ordered. Has had flu and pneumococcal vaccines at local pharmacy.

## 2024-08-07 ENCOUNTER — Other Ambulatory Visit: Payer: Self-pay

## 2024-08-07 ENCOUNTER — Encounter: Payer: Self-pay | Admitting: Orthopedic Surgery

## 2024-08-07 ENCOUNTER — Ambulatory Visit: Admitting: Orthopedic Surgery

## 2024-08-07 VITALS — BP 125/79 | Ht 72.0 in | Wt 195.0 lb

## 2024-08-07 DIAGNOSIS — S3422XS Injury of nerve root of sacral spine, sequela: Secondary | ICD-10-CM

## 2024-08-07 DIAGNOSIS — M1712 Unilateral primary osteoarthritis, left knee: Secondary | ICD-10-CM

## 2024-08-07 DIAGNOSIS — G834 Cauda equina syndrome: Secondary | ICD-10-CM

## 2024-08-07 DIAGNOSIS — Z9889 Other specified postprocedural states: Secondary | ICD-10-CM | POA: Diagnosis not present

## 2024-08-07 DIAGNOSIS — R002 Palpitations: Secondary | ICD-10-CM

## 2024-08-07 MED ORDER — METHYLPREDNISOLONE ACETATE 40 MG/ML IJ SUSP
40.0000 mg | Freq: Once | INTRAMUSCULAR | Status: AC
  Administered 2024-08-07: 40 mg via INTRA_ARTICULAR

## 2024-08-07 NOTE — Progress Notes (Signed)
" °  Intake history:  Chief Complaint  Patient presents with   Knee Pain    left     BP 125/79 Comment: 07/24/24  Ht 6' (1.829 m)   Wt 195 lb (88.5 kg)   BMI 26.45 kg/m  Body mass index is 26.45 kg/m.  Pharmacy? ____[facility]__  WHAT ARE WE SEEING YOU FOR TODAY?   Left knee  How long has this bothered you? (DOI?DOS?WS?)  Since 1978 has cauda equina/ now states left knee gave out 2 weeks ago has been painful worse since   Was there an injury? No  Anticoag.  No   Any ALLERGIES _______________Allergies[1] _______________________________   Treatment:  Have you taken:  Tylenol  No  Advil No  Had PT No  Had injection No  Other  _________________________        [1] No Known Allergies  "

## 2024-08-07 NOTE — Progress Notes (Signed)
 "  Office Visit Note   Patient: Russell [name]           Date of Birth: April 30, 1946           MRN: [id number] Visit Date: 08/07/2024 Requested by: [name], MD [address] [place],  [place] [address] PCP: [name], MD   Assessment & Plan:   Encounter Diagnoses  Name Primary?   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  I gave him an injection of cortisone and hope that this helps    Procedure note for injection   Chief Complaint  Patient presents with   Knee Pain    left     Encounter Diagnoses  Name Primary?   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  The injection was given without any complications appropriate precautions were given.    Subjective: Chief Complaint  Patient presents with   Knee Pain    left    HPI: 79 year old male who had a lumbar laminectomy many years ago and developed some type of postoperative nerve dysfunction involving his bladder peritoneum and left L2 nerve root presents with 2-week history of left knee pain interfering with his gait.  He says he does not want to undergo surgery and was hoping for either steroid injection or hyaluronic acid injection              ROS: The patient has numbness on the left side of his perineum including his penis, testicles, and anal  area with some bladder dysfunction.  There is also numbness on the medial side of his left thigh   Images personally read and my interpretation :  DG Knee AP/LAT W/Sunrise Left Result Date: 08/07/2024 3 views left knee Left knee pain X-ray shows varus alignment Medial compartment joint space narrowing Subchondral sclerosis and osteophytes Impression grade 3/4 arthritis     Visit Diagnoses:  1. S/P lumbar laminectomy   2. Primary osteoarthritis of left knee   3. 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    Objective: Vital Signs: BP 125/79 Comment: 07/24/24  Ht 6' (1.829 m)   Wt 195 lb (88.5 kg)   BMI 26.45 kg/m   Physical Exam Vitals and nursing note reviewed.  Constitutional:      Appearance: Normal appearance.  HENT:     Head: Normocephalic and atraumatic.  Eyes:     General: No scleral icterus.       Right eye: No discharge.        Left eye: No discharge.     Extraocular Movements: Extraocular movements intact.     Conjunctiva/sclera: Conjunctivae normal.  Pupils: Pupils are equal, round, and reactive to light.  Cardiovascular:     Rate and Rhythm: Normal rate.     Pulses: Normal pulses.  Skin:    General: Skin is warm and dry.     Capillary Refill: Capillary refill takes less than 2 seconds.  Neurological:     General: No focal deficit present.     Mental Status: He is alert and oriented to person, place, and time.  Psychiatric:        Mood and Affect: Mood normal.        Behavior: Behavior normal.        Thought Content: Thought content normal.        Judgment: Judgment normal.      Left Knee Exam   Other  Erythema: absent Scars: absent Pulse: present  Comments:  The left knee is aligned in varus.  He does have some medial lateral and peripatellar tenderness to palpation  There is a small joint effusion.  However, he has almost 130 degrees of flexion in the knee with 1 to 2 degrees flexion  contracture and a stable knee       Specialty Comments:  No specialty comments available.  Imaging: DG Knee AP/LAT W/Sunrise Left Result Date: 08/07/2024 3 views left knee Left knee pain X-ray shows varus alignment Medial compartment joint space narrowing Subchondral sclerosis and osteophytes Impression grade 3/4 arthritis     PMFS History: Patient Active Problem List   Diagnosis Date Noted   Paroxysmal SVT (supraventricular tachycardia) 07/25/2024   Family history of abdominal aortic aneurysm 02/27/2024   Family history of aortic aneurysm 02/27/2024   Family history of Marfan syndrome 02/27/2024   Genetic testing 12/03/2023   PMS2-related Lynch syndrome (HNPCC4) 12/03/2023   Bunion of right foot 11/05/2023   DNR (do not resuscitate) 10/30/2023   Allergic dermatitis 10/23/2023   Notalgia paresthetica 10/23/2023   Palpitations 07/19/2023   Gait disturbance 06/03/2023   H/O right hemicolectomy 01/14/2023   Erythema migrans (Lyme disease) 12/31/2022   Neurogenic bowel 11/30/2022   Cancer of right colon (HCC) 11/08/2022   IDA (iron  deficiency anemia) 09/26/2022   History of cancer of lower lobe bronchus or lung - s/p left lower lobectomy at [facility] 09-07-2021 09/26/2022   Superficial vein thrombosis 09/24/2022   Neuropathic pain 08/31/2022   Neurogenic bladder 08/31/2022   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   Encounter for general adult medical examination with abnormal findings 06/27/2022   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) 05/23/2021   Former smoker 05/05/2021   Elevated PSA 02/16/2021   S/P lumbar laminectomy 05/13/2020   Erectile dysfunction due to arterial insufficiency 05/11/2020   S/P cervical spinal fusion 05/10/2020   Benign prostatic hyperplasia with urinary obstruction 04/11/2020   DDD (degenerative disc disease), cervical 06/11/2018   DDD (degenerative disc disease), lumbar 06/11/2018   History of kidney stones 06/11/2018   Cauda equina  syndrome (HCC) 06/11/2018   Primary osteoarthritis of left knee 06/11/2018   Primary osteoarthritis of both feet 06/11/2018   Plantar fasciitis 06/11/2018   Hx of colonic polyp 05/29/2018   PAD (peripheral artery disease) 04/02/2018   Left lumbar radiculopathy 01/07/2018   Diastolic dysfunction 10/15/2016   Psoriasis 10/15/2016   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   Cervical spondylosis with radiculopathy 01/19/2014   GERD 10/02/2007   Prolapsed internal hemorrhoids, grade 2 and 3 09/10/2006   SLEEP APNEA 09/10/2006   Past Medical History:  Diagnosis Date   Acute blood loss anemia 09/26/2022  Anemia 2019   Arachnoiditis    Arrhythmia    Arthritis    Bleeding internal hemorrhoids 09/10/2006   Qualifier: Diagnosis of  By: Karren MD, Cornelius     Blood transfusion without reported diagnosis    Cancer [facility]) 2022   Cataract    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HCC) 2011   Cerebellar infarct [facility])    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   anterior cer disc and fusion with plates R6-5 and C4-5 at [facility]   Chronic kidney disease    Colon cancer (HCC)    Colonic mass 09/29/2022   Constipation    Encounter for general adult medical examination with abnormal findings 06/27/2022   Enlarged prostate    GERD (gastroesophageal reflux disease)    Hemorrhoids    History of kidney stones    passed stones   History of lumbar laminectomy 05/12/2020   open L2-L5 at [facility]   Hx of colonic polyp 05/29/2018   05/2018 cecal ssp   Lung cancer (HCC)    Lung nodule 02/2021   Lyme disease    Lynch syndrome    Maxillary sinus polyp    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   Mixed hyperlipidemia 07/19/2023   Pneumonia    Prolapsed internal hemorrhoids, grade 3 09/10/2006   Qualifier: Diagnosis of  By: Karren MD, Cornelius     Salivary gland enlargement    Sleep apnea 10 years ago   declined CPAP   Small intestine cancer (HCC)    Stroke (HCC) 15-20 years ago    Family History  Problem Relation Age of  Onset   Aortic aneurysm Mother    Varicose Veins Mother    Heart attack Father    Arthritis Father    Aortic aneurysm Sister    Early death Sister    CVA Sister    Aortic aneurysm Sister    Aortic aneurysm Sister    Breast cancer Paternal Aunt    Colon cancer Neg Hx    Pancreatic cancer Neg Hx    Rectal cancer Neg Hx    Stomach cancer Neg Hx    Esophageal cancer Neg Hx    Colon polyps Neg Hx     Past Surgical History:  Procedure Laterality Date   BIOPSY  09/28/2022   Procedure: BIOPSY;  Surgeon: Legrand Victory LITTIE DOUGLAS, MD;  Location: [facility];  Service: Gastroenterology;;   BRONCHIAL BIOPSY  05/16/2021   Procedure: BRONCHIAL BIOPSIES;  Surgeon: Brenna Adine LITTIE, DO;  Location: [name];  Service: Pulmonary;;   BRONCHIAL BRUSHINGS  05/16/2021   Procedure: BRONCHIAL BRUSHINGS;  Surgeon: Brenna Adine LITTIE, DO;  Location: [name];  Service: Pulmonary;;   BRONCHIAL NEEDLE ASPIRATION BIOPSY  05/16/2021   Procedure: BRONCHIAL NEEDLE ASPIRATION BIOPSIES;  Surgeon: Brenna Adine LITTIE, DO;  Location: [name];  Service: Pulmonary;;   CERVICAL SPINE SURGERY  04/10/2020   C3-C4 with pin [facility]   COLON SURGERY  11-09-2022   COLONOSCOPY     COLONOSCOPY WITH PROPOFOL  N/A 09/28/2022   Procedure: COLONOSCOPY WITH PROPOFOL ;  Surgeon: Legrand Victory LITTIE DOUGLAS, MD;  Location: [facility];  Service: Gastroenterology;  Laterality: N/A;   ESOPHAGOGASTRODUODENOSCOPY (EGD) WITH PROPOFOL  N/A 09/28/2022   Procedure: ESOPHAGOGASTRODUODENOSCOPY (EGD) WITH PROPOFOL ;  Surgeon: Legrand Victory LITTIE DOUGLAS, MD;  Location: [facility];  Service: Gastroenterology;  Laterality: N/A;   FIDUCIAL MARKER PLACEMENT  05/16/2021   Procedure: FIDUCIAL MARKER PLACEMENT;  Surgeon: Brenna Adine LITTIE, DO;  Location: [name];  Service: Pulmonary;;  HEMORRHOID BANDING     LUMBAR LAMINECTOMY  03/08/2010   L2-3, cauda equina syndrome   lumbar laminectomy  05/12/2020   [facility]   SMALL INTESTINE SURGERY  11-09-2022   SPINE  SURGERY     UPPER GI ENDOSCOPY     VIDEO BRONCHOSCOPY WITH ENDOBRONCHIAL NAVIGATION Left 05/16/2021   Procedure: VIDEO BRONCHOSCOPY WITH ENDOBRONCHIAL NAVIGATION;  Surgeon: Brenna Adine CROME, DO;  Location: [name];  Service: Pulmonary;  Laterality: Left;  ION w/ fiducial placement   VIDEO BRONCHOSCOPY WITH RADIAL ENDOBRONCHIAL ULTRASOUND  05/16/2021   Procedure: VIDEO BRONCHOSCOPY WITH RADIAL ENDOBRONCHIAL ULTRASOUND;  Surgeon: Brenna Adine CROME, DO;  Location: [name];  Service: Pulmonary;;   wisdom teeth ext     Social History   Occupational History   Occupation: retired  Tobacco Use   Smoking status: Former    Current packs/day: 0.00    Average packs/day: 0.3 packs/day for 9.0 years (2.3 ttl pk-yrs)    Types: Cigarettes    Start date: 10/15/1965    Quit date: 10/16/1974    Years since quitting: 49.8    Passive exposure: Past   Smokeless tobacco: Never  Vaping Use   Vaping status: Never Used  Substance and Sexual Activity   Alcohol use: Not Currently   Drug use: No   Sexual activity: Not Currently       "

## 2024-08-10 MED ORDER — METOPROLOL SUCCINATE ER 25 MG PO TB24: 25.0000 mg | ORAL_TABLET | Freq: Every day | ORAL | 3 refills | Status: AC

## 2024-10-23 ENCOUNTER — Ambulatory Visit: Admitting: Urology

## 2024-12-29 ENCOUNTER — Ambulatory Visit: Admitting: Rheumatology

## 2025-01-22 ENCOUNTER — Ambulatory Visit: Payer: Self-pay | Admitting: Internal Medicine

## 2025-04-13 ENCOUNTER — Ambulatory Visit
